# Patient Record
Sex: Female | Born: 1935 | Race: White | Hispanic: No | Marital: Married | Smoking: Former smoker
Health system: Southern US, Community
[De-identification: ages and names within clinical notes are randomized; demographics above are authoritative.]

## PROBLEM LIST (undated history)

## (undated) DIAGNOSIS — I422 Other hypertrophic cardiomyopathy: Secondary | ICD-10-CM

## (undated) DIAGNOSIS — I059 Rheumatic mitral valve disease, unspecified: Secondary | ICD-10-CM

## (undated) DIAGNOSIS — I2510 Atherosclerotic heart disease of native coronary artery without angina pectoris: Secondary | ICD-10-CM

## (undated) DIAGNOSIS — G4730 Sleep apnea, unspecified: Secondary | ICD-10-CM

## (undated) DIAGNOSIS — I350 Nonrheumatic aortic (valve) stenosis: Secondary | ICD-10-CM

## (undated) DIAGNOSIS — J449 Chronic obstructive pulmonary disease, unspecified: Secondary | ICD-10-CM

## (undated) DIAGNOSIS — E785 Hyperlipidemia, unspecified: Secondary | ICD-10-CM

## (undated) DIAGNOSIS — D649 Anemia, unspecified: Secondary | ICD-10-CM

## (undated) DIAGNOSIS — E039 Hypothyroidism, unspecified: Secondary | ICD-10-CM

## (undated) DIAGNOSIS — I50812 Chronic right heart failure: Secondary | ICD-10-CM

## (undated) DIAGNOSIS — N6009 Solitary cyst of unspecified breast: Secondary | ICD-10-CM

## (undated) DIAGNOSIS — I10 Essential (primary) hypertension: Secondary | ICD-10-CM

## (undated) DIAGNOSIS — I4891 Unspecified atrial fibrillation: Secondary | ICD-10-CM

## (undated) HISTORY — DX: Hyperlipidemia, unspecified: E78.5

## (undated) HISTORY — DX: Essential (primary) hypertension: I10

## (undated) HISTORY — DX: Solitary cyst of unspecified breast: N60.09

## (undated) HISTORY — DX: Atherosclerotic heart disease of native coronary artery without angina pectoris: I25.10

## (undated) HISTORY — PX: COLONOSCOPY: SHX174

## (undated) HISTORY — DX: Other hypertrophic cardiomyopathy: I42.2

## (undated) HISTORY — DX: Unspecified atrial fibrillation: I48.91

## (undated) HISTORY — DX: Rheumatic mitral valve disease, unspecified: I05.9

## (undated) HISTORY — DX: Nonrheumatic aortic (valve) stenosis: I35.0

## (undated) HISTORY — PX: BREAST SURGERY: SHX581

## (undated) HISTORY — PX: OTHER SURGICAL HISTORY: SHX169

---

## 1998-09-15 ENCOUNTER — Ambulatory Visit (HOSPITAL_COMMUNITY): Admission: RE | Admit: 1998-09-15 | Discharge: 1998-09-15 | Payer: Self-pay | Admitting: Cardiology

## 1999-04-18 ENCOUNTER — Ambulatory Visit (HOSPITAL_COMMUNITY): Admission: RE | Admit: 1999-04-18 | Discharge: 1999-04-18 | Payer: Self-pay | Admitting: *Deleted

## 2001-05-21 HISTORY — PX: CATARACT EXTRACTION: SUR2

## 2001-05-21 HISTORY — PX: NECK MASS EXCISION: SHX2079

## 2002-03-02 ENCOUNTER — Encounter: Admission: RE | Admit: 2002-03-02 | Discharge: 2002-03-02 | Payer: Self-pay | Admitting: Otolaryngology

## 2002-03-02 ENCOUNTER — Encounter: Payer: Self-pay | Admitting: Otolaryngology

## 2002-03-03 ENCOUNTER — Encounter (INDEPENDENT_AMBULATORY_CARE_PROVIDER_SITE_OTHER): Payer: Self-pay | Admitting: *Deleted

## 2002-03-03 ENCOUNTER — Ambulatory Visit (HOSPITAL_BASED_OUTPATIENT_CLINIC_OR_DEPARTMENT_OTHER): Admission: RE | Admit: 2002-03-03 | Discharge: 2002-03-03 | Payer: Self-pay | Admitting: Otolaryngology

## 2002-03-17 ENCOUNTER — Ambulatory Visit (HOSPITAL_COMMUNITY): Admission: RE | Admit: 2002-03-17 | Discharge: 2002-03-17 | Payer: Self-pay | Admitting: Ophthalmology

## 2003-03-04 ENCOUNTER — Encounter: Admission: RE | Admit: 2003-03-04 | Discharge: 2003-03-04 | Payer: Self-pay | Admitting: Internal Medicine

## 2003-03-04 ENCOUNTER — Encounter: Payer: Self-pay | Admitting: Internal Medicine

## 2004-03-08 ENCOUNTER — Ambulatory Visit (HOSPITAL_COMMUNITY): Admission: RE | Admit: 2004-03-08 | Discharge: 2004-03-08 | Payer: Self-pay | Admitting: Internal Medicine

## 2004-08-10 ENCOUNTER — Encounter (INDEPENDENT_AMBULATORY_CARE_PROVIDER_SITE_OTHER): Payer: Self-pay | Admitting: Specialist

## 2004-08-10 ENCOUNTER — Ambulatory Visit (HOSPITAL_COMMUNITY): Admission: RE | Admit: 2004-08-10 | Discharge: 2004-08-10 | Payer: Self-pay | Admitting: *Deleted

## 2004-08-16 ENCOUNTER — Ambulatory Visit: Payer: Self-pay | Admitting: Internal Medicine

## 2004-08-21 ENCOUNTER — Ambulatory Visit: Payer: Self-pay | Admitting: Internal Medicine

## 2004-09-28 ENCOUNTER — Other Ambulatory Visit: Admission: RE | Admit: 2004-09-28 | Discharge: 2004-09-28 | Payer: Self-pay | Admitting: Internal Medicine

## 2005-02-22 ENCOUNTER — Ambulatory Visit: Payer: Self-pay | Admitting: Internal Medicine

## 2005-05-21 HISTORY — PX: MITRAL VALVE REPLACEMENT: SHX147

## 2005-07-23 ENCOUNTER — Ambulatory Visit: Payer: Self-pay

## 2005-07-23 ENCOUNTER — Encounter: Payer: Self-pay | Admitting: Cardiology

## 2005-07-27 ENCOUNTER — Ambulatory Visit: Payer: Self-pay | Admitting: Internal Medicine

## 2005-08-02 ENCOUNTER — Inpatient Hospital Stay (HOSPITAL_BASED_OUTPATIENT_CLINIC_OR_DEPARTMENT_OTHER): Admission: RE | Admit: 2005-08-02 | Discharge: 2005-08-02 | Payer: Self-pay | Admitting: Internal Medicine

## 2005-08-02 ENCOUNTER — Ambulatory Visit: Payer: Self-pay | Admitting: Internal Medicine

## 2005-08-07 ENCOUNTER — Ambulatory Visit: Payer: Self-pay | Admitting: Cardiology

## 2005-08-15 ENCOUNTER — Ambulatory Visit: Payer: Self-pay | Admitting: *Deleted

## 2005-09-28 ENCOUNTER — Ambulatory Visit: Payer: Self-pay | Admitting: Internal Medicine

## 2005-10-01 ENCOUNTER — Encounter (INDEPENDENT_AMBULATORY_CARE_PROVIDER_SITE_OTHER): Payer: Self-pay | Admitting: Specialist

## 2005-10-01 ENCOUNTER — Inpatient Hospital Stay (HOSPITAL_COMMUNITY): Admission: RE | Admit: 2005-10-01 | Discharge: 2005-10-13 | Payer: Self-pay | Admitting: Surgery

## 2005-10-17 ENCOUNTER — Ambulatory Visit: Payer: Self-pay | Admitting: Internal Medicine

## 2005-10-23 ENCOUNTER — Ambulatory Visit: Payer: Self-pay

## 2005-10-31 ENCOUNTER — Ambulatory Visit: Payer: Self-pay | Admitting: Cardiovascular Disease

## 2005-11-01 ENCOUNTER — Encounter (HOSPITAL_COMMUNITY): Admission: RE | Admit: 2005-11-01 | Discharge: 2006-01-30 | Payer: Self-pay | Admitting: Internal Medicine

## 2005-11-06 ENCOUNTER — Ambulatory Visit: Payer: Self-pay | Admitting: Cardiology

## 2005-11-12 ENCOUNTER — Encounter: Payer: Self-pay | Admitting: Cardiology

## 2005-11-12 ENCOUNTER — Ambulatory Visit: Payer: Self-pay

## 2005-11-12 ENCOUNTER — Ambulatory Visit: Payer: Self-pay | Admitting: Internal Medicine

## 2005-11-14 ENCOUNTER — Ambulatory Visit: Payer: Self-pay | Admitting: Internal Medicine

## 2005-11-16 ENCOUNTER — Ambulatory Visit: Payer: Self-pay | Admitting: Internal Medicine

## 2005-11-27 ENCOUNTER — Ambulatory Visit: Payer: Self-pay | Admitting: *Deleted

## 2005-11-27 ENCOUNTER — Ambulatory Visit: Payer: Self-pay | Admitting: Internal Medicine

## 2005-12-07 ENCOUNTER — Ambulatory Visit: Payer: Self-pay | Admitting: Cardiology

## 2005-12-13 ENCOUNTER — Ambulatory Visit: Payer: Self-pay | Admitting: Internal Medicine

## 2005-12-19 ENCOUNTER — Ambulatory Visit: Payer: Self-pay | Admitting: Internal Medicine

## 2006-01-06 DIAGNOSIS — Z953 Presence of xenogenic heart valve: Secondary | ICD-10-CM

## 2006-01-31 ENCOUNTER — Encounter (HOSPITAL_COMMUNITY): Admission: RE | Admit: 2006-01-31 | Discharge: 2006-02-15 | Payer: Self-pay | Admitting: Internal Medicine

## 2006-03-19 ENCOUNTER — Ambulatory Visit: Payer: Self-pay | Admitting: Internal Medicine

## 2006-03-19 LAB — CONVERTED CEMR LAB
AST: 22 units/L (ref 0–37)
Albumin: 3.6 g/dL (ref 3.5–5.2)
Alkaline Phosphatase: 69 units/L (ref 39–117)
Chol/HDL Ratio, serum: 3.8
Triglyceride fasting, serum: 85 mg/dL (ref 0–149)
VLDL: 17 mg/dL (ref 0–40)

## 2006-04-22 ENCOUNTER — Ambulatory Visit: Payer: Self-pay | Admitting: Internal Medicine

## 2006-04-22 LAB — CONVERTED CEMR LAB
AST: 22 units/L (ref 0–37)
BUN: 12 mg/dL (ref 6–23)
Calcium: 8.9 mg/dL (ref 8.4–10.5)
Chol/HDL Ratio, serum: 3.7
Cholesterol: 140 mg/dL (ref 0–200)
GFR calc non Af Amer: 86 mL/min
Glomerular Filtration Rate, Af Am: 105 mL/min/{1.73_m2}
Glucose, Bld: 90 mg/dL (ref 70–99)
Potassium: 4 meq/L (ref 3.5–5.1)
Pro B Natriuretic peptide (BNP): 507 pg/mL — ABNORMAL HIGH (ref 0.0–100.0)
VLDL: 26 mg/dL (ref 0–40)

## 2006-05-03 ENCOUNTER — Ambulatory Visit: Payer: Self-pay | Admitting: Internal Medicine

## 2006-05-24 ENCOUNTER — Ambulatory Visit: Payer: Self-pay | Admitting: Internal Medicine

## 2006-07-26 ENCOUNTER — Ambulatory Visit: Payer: Self-pay | Admitting: Internal Medicine

## 2006-07-26 LAB — CONVERTED CEMR LAB
AST: 24 units/L (ref 0–37)
HDL: 39.9 mg/dL (ref >39.0)
Total CHOL/HDL Ratio: 3.6
Triglycerides: 123 mg/dL (ref 0–149)

## 2006-09-03 ENCOUNTER — Encounter (INDEPENDENT_AMBULATORY_CARE_PROVIDER_SITE_OTHER): Payer: Self-pay | Admitting: Specialist

## 2006-09-03 ENCOUNTER — Ambulatory Visit (HOSPITAL_COMMUNITY): Admission: RE | Admit: 2006-09-03 | Discharge: 2006-09-03 | Payer: Self-pay | Admitting: *Deleted

## 2006-09-23 ENCOUNTER — Ambulatory Visit: Payer: Self-pay | Admitting: Internal Medicine

## 2007-01-31 ENCOUNTER — Ambulatory Visit: Payer: Self-pay | Admitting: Internal Medicine

## 2007-02-05 ENCOUNTER — Encounter: Payer: Self-pay | Admitting: Internal Medicine

## 2007-02-05 ENCOUNTER — Ambulatory Visit: Payer: Self-pay

## 2007-02-05 ENCOUNTER — Ambulatory Visit: Payer: Self-pay | Admitting: Internal Medicine

## 2007-02-05 LAB — CONVERTED CEMR LAB
BUN: 13 mg/dL (ref 6–23)
Basophils Absolute: 0 10*3/uL (ref 0.0–0.1)
Basophils Relative: 0.3 % (ref 0.0–1.0)
CO2: 31 meq/L (ref 19–32)
Calcium: 9.4 mg/dL (ref 8.4–10.5)
GFR calc non Af Amer: 86 mL/min
Glucose, Bld: 112 mg/dL — ABNORMAL HIGH (ref 70–99)
HCT: 40.8 % (ref 36.0–46.0)
Lymphocytes Relative: 16.8 % (ref 12.0–46.0)
MCHC: 34.1 g/dL (ref 30.0–36.0)
Monocytes Absolute: 0.6 10*3/uL (ref 0.2–0.7)
Neutrophils Relative %: 70.2 % (ref 43.0–77.0)
Platelets: 141 10*3/uL — ABNORMAL LOW (ref 150–400)
TSH: 1.52 microintl units/mL (ref 0.35–5.50)
WBC: 7.2 10*3/uL (ref 4.5–10.5)

## 2007-03-20 ENCOUNTER — Ambulatory Visit: Payer: Self-pay | Admitting: Internal Medicine

## 2007-05-22 HISTORY — PX: LAPAROSCOPIC CHOLECYSTECTOMY: SUR755

## 2007-06-18 ENCOUNTER — Encounter: Admission: RE | Admit: 2007-06-18 | Discharge: 2007-06-18 | Payer: Self-pay | Admitting: Internal Medicine

## 2007-07-24 ENCOUNTER — Ambulatory Visit: Payer: Self-pay | Admitting: Internal Medicine

## 2007-08-04 ENCOUNTER — Encounter: Payer: Self-pay | Admitting: Internal Medicine

## 2007-08-04 ENCOUNTER — Ambulatory Visit: Payer: Self-pay

## 2007-10-02 ENCOUNTER — Ambulatory Visit (HOSPITAL_COMMUNITY): Admission: RE | Admit: 2007-10-02 | Discharge: 2007-10-02 | Payer: Self-pay | Admitting: General Surgery

## 2007-10-02 ENCOUNTER — Encounter (INDEPENDENT_AMBULATORY_CARE_PROVIDER_SITE_OTHER): Payer: Self-pay | Admitting: General Surgery

## 2008-04-05 ENCOUNTER — Ambulatory Visit: Payer: Self-pay | Admitting: Internal Medicine

## 2008-08-30 ENCOUNTER — Encounter: Payer: Self-pay | Admitting: Internal Medicine

## 2008-09-06 ENCOUNTER — Encounter: Admission: RE | Admit: 2008-09-06 | Discharge: 2008-09-06 | Payer: Self-pay | Admitting: Internal Medicine

## 2008-11-03 ENCOUNTER — Encounter (INDEPENDENT_AMBULATORY_CARE_PROVIDER_SITE_OTHER): Payer: Self-pay | Admitting: *Deleted

## 2008-11-03 ENCOUNTER — Ambulatory Visit (HOSPITAL_COMMUNITY): Admission: RE | Admit: 2008-11-03 | Discharge: 2008-11-03 | Payer: Self-pay | Admitting: *Deleted

## 2009-01-06 DIAGNOSIS — Z872 Personal history of diseases of the skin and subcutaneous tissue: Secondary | ICD-10-CM | POA: Insufficient documentation

## 2009-01-06 DIAGNOSIS — I482 Chronic atrial fibrillation, unspecified: Secondary | ICD-10-CM | POA: Insufficient documentation

## 2009-01-06 DIAGNOSIS — E785 Hyperlipidemia, unspecified: Secondary | ICD-10-CM | POA: Insufficient documentation

## 2009-01-06 DIAGNOSIS — I119 Hypertensive heart disease without heart failure: Secondary | ICD-10-CM | POA: Insufficient documentation

## 2009-01-06 DIAGNOSIS — I2510 Atherosclerotic heart disease of native coronary artery without angina pectoris: Secondary | ICD-10-CM | POA: Insufficient documentation

## 2009-01-06 DIAGNOSIS — I359 Nonrheumatic aortic valve disorder, unspecified: Secondary | ICD-10-CM | POA: Insufficient documentation

## 2009-01-10 ENCOUNTER — Ambulatory Visit: Payer: Self-pay | Admitting: Internal Medicine

## 2009-02-11 ENCOUNTER — Telehealth: Payer: Self-pay | Admitting: Internal Medicine

## 2009-02-21 ENCOUNTER — Telehealth: Payer: Self-pay | Admitting: Internal Medicine

## 2009-07-16 ENCOUNTER — Emergency Department (HOSPITAL_COMMUNITY): Admission: EM | Admit: 2009-07-16 | Discharge: 2009-07-16 | Payer: Self-pay | Admitting: Emergency Medicine

## 2009-07-16 ENCOUNTER — Ambulatory Visit: Payer: Self-pay | Admitting: Internal Medicine

## 2009-08-05 ENCOUNTER — Ambulatory Visit: Payer: Self-pay | Admitting: Internal Medicine

## 2009-08-10 ENCOUNTER — Ambulatory Visit: Payer: Self-pay

## 2009-08-10 ENCOUNTER — Ambulatory Visit (HOSPITAL_COMMUNITY): Admission: RE | Admit: 2009-08-10 | Discharge: 2009-08-10 | Payer: Self-pay | Admitting: Internal Medicine

## 2009-08-10 ENCOUNTER — Ambulatory Visit: Payer: Self-pay | Admitting: Internal Medicine

## 2009-08-10 ENCOUNTER — Encounter: Payer: Self-pay | Admitting: Internal Medicine

## 2009-08-10 LAB — CONVERTED CEMR LAB: POC INR: 1.4

## 2009-08-17 ENCOUNTER — Ambulatory Visit: Payer: Self-pay | Admitting: Internal Medicine

## 2009-08-24 ENCOUNTER — Ambulatory Visit: Payer: Self-pay | Admitting: Cardiology

## 2009-08-24 LAB — CONVERTED CEMR LAB: POC INR: 2.9

## 2009-08-31 ENCOUNTER — Ambulatory Visit: Payer: Self-pay | Admitting: Cardiovascular Disease

## 2009-09-12 ENCOUNTER — Telehealth: Payer: Self-pay | Admitting: Internal Medicine

## 2009-09-14 ENCOUNTER — Ambulatory Visit: Payer: Self-pay | Admitting: Internal Medicine

## 2009-09-28 ENCOUNTER — Encounter: Payer: Self-pay | Admitting: Pharmacist

## 2009-09-28 ENCOUNTER — Ambulatory Visit: Payer: Self-pay | Admitting: Cardiology

## 2009-10-07 ENCOUNTER — Ambulatory Visit: Payer: Self-pay | Admitting: Internal Medicine

## 2009-10-07 ENCOUNTER — Telehealth: Payer: Self-pay | Admitting: Internal Medicine

## 2009-10-19 ENCOUNTER — Ambulatory Visit: Payer: Self-pay | Admitting: Internal Medicine

## 2009-11-02 ENCOUNTER — Encounter: Payer: Self-pay | Admitting: Internal Medicine

## 2009-11-10 ENCOUNTER — Encounter: Payer: Self-pay | Admitting: Cardiovascular Disease

## 2009-11-10 ENCOUNTER — Ambulatory Visit: Payer: Self-pay | Admitting: Cardiology

## 2009-11-10 ENCOUNTER — Ambulatory Visit: Payer: Self-pay | Admitting: Internal Medicine

## 2009-11-10 DIAGNOSIS — I421 Obstructive hypertrophic cardiomyopathy: Secondary | ICD-10-CM | POA: Insufficient documentation

## 2009-11-10 LAB — CONVERTED CEMR LAB: POC INR: 1.3

## 2009-11-24 ENCOUNTER — Encounter: Payer: Self-pay | Admitting: Internal Medicine

## 2009-11-24 ENCOUNTER — Ambulatory Visit: Payer: Self-pay | Admitting: Internal Medicine

## 2009-11-24 ENCOUNTER — Ambulatory Visit: Payer: Self-pay

## 2009-11-30 ENCOUNTER — Telehealth: Payer: Self-pay | Admitting: Internal Medicine

## 2009-12-07 ENCOUNTER — Telehealth: Payer: Self-pay | Admitting: Internal Medicine

## 2009-12-15 ENCOUNTER — Ambulatory Visit: Payer: Self-pay | Admitting: Cardiology

## 2009-12-15 ENCOUNTER — Ambulatory Visit: Payer: Self-pay | Admitting: Internal Medicine

## 2009-12-15 ENCOUNTER — Encounter: Payer: Self-pay | Admitting: Internal Medicine

## 2009-12-15 LAB — CONVERTED CEMR LAB
CO2: 27 meq/L (ref 19–32)
Creatinine, Ser: 0.7 mg/dL (ref 0.4–1.2)
Glucose, Bld: 87 mg/dL (ref 70–99)
Potassium: 3.4 meq/L — ABNORMAL LOW (ref 3.5–5.1)

## 2009-12-27 ENCOUNTER — Telehealth: Payer: Self-pay | Admitting: Internal Medicine

## 2009-12-28 ENCOUNTER — Telehealth: Payer: Self-pay | Admitting: Internal Medicine

## 2010-01-05 ENCOUNTER — Ambulatory Visit: Payer: Self-pay | Admitting: Internal Medicine

## 2010-01-05 LAB — CONVERTED CEMR LAB: POC INR: 2.3

## 2010-01-06 ENCOUNTER — Telehealth: Payer: Self-pay | Admitting: Internal Medicine

## 2010-01-09 LAB — CONVERTED CEMR LAB
BUN: 17 mg/dL (ref 6–23)
CO2: 30 meq/L (ref 19–32)
Calcium: 9.2 mg/dL (ref 8.4–10.5)
Chloride: 106 meq/L (ref 96–112)
Creatinine, Ser: 0.7 mg/dL (ref 0.4–1.2)
GFR calc non Af Amer: 85.43 mL/min (ref >60)
Glucose, Bld: 70 mg/dL (ref 70–99)
Potassium: 3.8 meq/L (ref 3.5–5.1)

## 2010-01-30 ENCOUNTER — Ambulatory Visit: Payer: Self-pay | Admitting: Cardiovascular Disease

## 2010-01-30 ENCOUNTER — Ambulatory Visit: Payer: Self-pay | Admitting: Internal Medicine

## 2010-02-14 ENCOUNTER — Telehealth: Payer: Self-pay | Admitting: Internal Medicine

## 2010-02-24 ENCOUNTER — Encounter: Payer: Self-pay | Admitting: Internal Medicine

## 2010-03-06 ENCOUNTER — Telehealth (INDEPENDENT_AMBULATORY_CARE_PROVIDER_SITE_OTHER): Payer: Self-pay | Admitting: *Deleted

## 2010-03-07 ENCOUNTER — Encounter: Payer: Self-pay | Admitting: Internal Medicine

## 2010-03-13 ENCOUNTER — Ambulatory Visit: Payer: Self-pay | Admitting: Internal Medicine

## 2010-03-13 LAB — CONVERTED CEMR LAB: POC INR: 2.4

## 2010-03-30 ENCOUNTER — Encounter
Admission: RE | Admit: 2010-03-30 | Discharge: 2010-05-05 | Payer: Self-pay | Source: Home / Self Care | Attending: Orthopedic Surgery | Admitting: Orthopedic Surgery

## 2010-05-04 ENCOUNTER — Encounter: Payer: Self-pay | Admitting: Cardiovascular Disease

## 2010-05-26 ENCOUNTER — Ambulatory Visit
Admission: RE | Admit: 2010-05-26 | Discharge: 2010-05-26 | Payer: Self-pay | Source: Home / Self Care | Attending: Internal Medicine | Admitting: Internal Medicine

## 2010-05-26 ENCOUNTER — Encounter: Payer: Self-pay | Admitting: Internal Medicine

## 2010-05-29 LAB — CONVERTED CEMR LAB
TSH: 5.202 microintl units/mL — ABNORMAL HIGH (ref 0.350–4.500)
Vit D, 25-Hydroxy: 34 ng/mL (ref 30–89)

## 2010-05-30 ENCOUNTER — Other Ambulatory Visit: Payer: Self-pay | Admitting: Internal Medicine

## 2010-05-30 ENCOUNTER — Ambulatory Visit
Admission: RE | Admit: 2010-05-30 | Discharge: 2010-05-30 | Payer: Self-pay | Source: Home / Self Care | Attending: Internal Medicine | Admitting: Internal Medicine

## 2010-05-30 LAB — T3, FREE: T3, Free: 1.8 pg/mL — ABNORMAL LOW (ref 2.3–4.2)

## 2010-05-30 LAB — T4, FREE: Free T4: 0.69 ng/dL (ref 0.60–1.60)

## 2010-06-09 ENCOUNTER — Encounter: Payer: Self-pay | Admitting: Internal Medicine

## 2010-06-13 ENCOUNTER — Telehealth: Payer: Self-pay | Admitting: Internal Medicine

## 2010-06-20 NOTE — Miscellaneous (Signed)
Summary: echo added to flow sheet  Clinical Lists Changes  Observations: Added new observation of ECHOINTERP:         Study Conclusions            - Left ventricle: The cavity size was normal. Wall thickness was       increased in a pattern of mild LVH. Systolic function was       vigorous. The estimated ejection fraction was in the range of 70%       to 75%. There was dynamic obstruction, with mid-cavity       obliteration. Wall motion was normal; there were no regional wall       motion abnormalities. Features are consistent with a pseudonormal       left ventricular filling pattern, with concomitant abnormal       relaxation and increased filling pressure (grade 2 diastolic       dysfunction). Doppler parameters are consistent with very high       ventricular filling pressure. The subaortic valve LVOT is very       narrow with turbulent flow. Peak gradient across LVOT and AoV is       33mm HG with mean 16. No significant change with Valsalva.     - Aortic valve: Trivial regurgitation.     - Mitral valve: A bioprosthesis was present. Valve area by pressure       half-time: 2.47cm 2. (08/10/2009 11:07)      Echocardiogram  Procedure date:  08/10/2009  Findings:              Study Conclusions            - Left ventricle: The cavity size was normal. Wall thickness was       increased in a pattern of mild LVH. Systolic function was       vigorous. The estimated ejection fraction was in the range of 70%       to 75%. There was dynamic obstruction, with mid-cavity       obliteration. Wall motion was normal; there were no regional wall       motion abnormalities. Features are consistent with a pseudonormal       left ventricular filling pattern, with concomitant abnormal       relaxation and increased filling pressure (grade 2 diastolic       dysfunction). Doppler parameters are consistent with very high       ventricular filling pressure. The subaortic valve LVOT is very    narrow with turbulent flow. Peak gradient across LVOT and AoV is       33mm HG with mean 16. No significant change with Valsalva.     - Aortic valve: Trivial regurgitation.     - Mitral valve: A bioprosthesis was present. Valve area by pressure       half-time: 2.47cm 2.

## 2010-06-20 NOTE — Progress Notes (Signed)
Summary: question re toporol  Phone Note Call from Patient   Caller: Patient [phone] Reason for Call: Talk to Nurse Summary of Call: pt calling re toporol Initial call taken by: Glynda Jaeger,  February 14, 2010 2:24 PM  Follow-up for Phone Call        Patient called to report BP readings since she is not taking Toprol anymore because of the nightmares. They are the following: 98/57 hr67 99/56 hr69 103/56 hr57 120/58 hr57 117/60 hr58. She states that she is still having dreams at night but not nightmares. No complaints of palps or dizziness. Advised her I would pass BP readings along to Dr.Brittay Mogle. She will follow the same medication protocols for now. Layne Benton, RN, BSN  February 14, 2010 7:09 PM      Appended Document: question re toporol Stay off toprol. If she is feeling OK keep on other meds.  If she is feeling dizzy and bp is consistently below 100 would decrease Norvasc to 1/2 tab per day Patient should bring cuff to cllnic.  Appended Document: question re toporol LMOM with above information.  Appended Document: question re toporol I would keep things the same.  If she notes dizziness could cut Cozaar in 1/2.  Follow bp.

## 2010-06-20 NOTE — Progress Notes (Signed)
Summary: med ques  Phone Note From Pharmacy   Caller: [facility]  [address] [phone]* Summary of Call: Per Ebony Cargo caling to verify pt cozaar. Pt states should be twice a day not once. [phone] Initial call taken by: Edman Circle,  December 28, 2009 12:40 PM  Follow-up for Phone Call        PHARMACISTS AWARE PT TO BE TAKING COZAAR 100 MG 1 once daily Follow-up by: Scherrie Bateman, LPN,  December 28, 2009 12:53 PM

## 2010-06-20 NOTE — Progress Notes (Signed)
Summary: c/o bp issues  Phone Note Call from Patient Call back at Home Phone [phone]   Caller: Patient Reason for Call: Talk to Nurse Summary of Call: per pt calling, meds was increase to 100 mg c/o b/p 156/71. no cp, sob.  Initial call taken by: Lorne Skeens,  November 30, 2009 12:47 PM  Follow-up for Phone Call        I spoke with the pt and she did increase her cozaar as instructed.  The pt was concerned because her BP at home has been running 173/80, 163/81, 173/80, 148/71, and 156/75.  The pt's pulse 49, 50, 53.  I will forward this information to Dr Tenny Craw for further recommendations.  Julieta Gutting, RN, BSN  November 30, 2009 1:07 PM  Additional Follow-up for Phone Call Additional follow up Details #1::        I called patient in follow up and advised her to take BP and HR every day so we can see a trend and call me back on Monday (7/18)  with readings so we can review with Dr.Ross. She agreed to this plan. Additional Follow-up by: Suzan Garibaldi RN    Additional Follow-up for Phone Call Additional follow up Details #2::    Called patient  BP running high 140s to 170s. Rec:  Norvasc  5 mg (patientt will cut in 1/2 for 2.5) Continue other meds for now. When comes to coumadin clinic next wk  get EKG (may be set up) Keep checking bp Follow-up by: Sherrill Raring, MD, [facility],  December 07, 2009 11:24 AM   Appended Document: c/o bp issues**Dr Tenny Craw** called pt and left mess will do ekg and bp on 7/28 w/coumadin appt

## 2010-06-20 NOTE — Medication Information (Signed)
Summary: Patricia [name]  Anticoagulant Therapy  Managed by: Eda Keys, PharmD Referring MD: Dr Dietrich Pates PCP: Dr Diona Foley MD: Gala Romney MD, Reuel Boom Indication 1: Atrial Fibrillation Lab Used: [facility] [place] Site: [address] INR POC 2.6 INR RANGE 2.0-3.0  Dietary changes: no    Health status changes: no    Bleeding/hemorrhagic complications: no    Recent/future hospitalizations: no    Any changes in medication regimen? yes       Details: amiodarone 200 mg twice daily this past friday (08/12/09)  Recent/future dental: no  Any missed doses?: no       Is patient compliant with meds? yes       Current Medications (verified): 1)  Cartia Xt 240 Mg Xr24h-Cap (Diltiazem Hcl Coated Beads) .... One Tab Daily 2)  Cozaar 50 Mg Tabs (Losartan Potassium) .... One Tab Daily 3)  Crestor 5 Mg Tabs (Rosuvastatin Calcium) .... Take 1/4 Tab Once Daily 4)  Centrum Ultra Womens  Tabs (Multiple Vitamins-Minerals) .... One Tab Daily 5)  Calcium-Vitamin D 600-200 Mg-Unit Tabs (Calcium-Vitamin D) .... One Tab Two Times A Day 6)  Vitamin D 1000 Unit Tabs (Cholecalciferol) .... One Tab Daily 7)  Aspirin 81 Mg Tbec (Aspirin) .... Take One Tablet By Mouth Daily 8)  Flaxseed Oil 1200 Mg Caps (Flaxseed (Linseed)) .... 2 By Mouth Daily 9)  Vitamin C 500 Mg Tabs (Ascorbic Acid) .[name].. 1 By Mouth Daily 10)  Warfarin Sodium 5 Mg Tabs (Warfarin Sodium) .... As Directed 11)  Amiodarone Hcl 400 Mg Tabs (Amiodarone Hcl) .... Take One Tablet By Mouth Daily For 2 Months  Allergies (verified): 1)  ! * Xray Dye 2)  ! Macrobid  Anticoagulation Management History:      The patient is taking warfarin and comes in today for a routine follow up visit.  Positive risk factors for bleeding include an age of 80 years or older.  The bleeding index is 'intermediate risk'.  Positive CHADS2 values include History of HTN and Age > 13 years old.  Anticoagulation responsible provider: Bensimhon MD,  Reuel Boom.  INR POC: 2.6.  Cuvette Lot#: [id number].  Exp: 09/2010.    Anticoagulation Management Assessment/Plan:      The patient's current anticoagulation dose is Warfarin sodium 5 mg tabs: AS DIRECTED.  The target INR is 2.0-3.0.  The next INR is due 08/24/2009.  Results were reviewed/authorized by Eda Keys, PharmD.  She was notified by Eda Keys.         Prior Anticoagulation Instructions: INR 1.4  Take 1.5 tablets today then resume same dosage 1 tablet daily.  Recheck in 1 week.    Current Anticoagulation Instructions: INR 2.6  Continue taking 1 tablet daily.  Return to clinic in 1 week.

## 2010-06-20 NOTE — Progress Notes (Signed)
Summary: speak to nurse  Phone Note Call from Patient Call back at Home Phone [phone]   Caller: Patient Reason for Call: Talk to Nurse Summary of Call: request to speak to nurse Initial call taken by: Migdalia Dk,  Oct 07, 2009 11:08 AM  Follow-up for Phone Call        Called pt. set up for  EKG in our office for 6/1 because her PCP does not do ekg's in his office. Follow-up by: Suzan Garibaldi RN

## 2010-06-20 NOTE — Assessment & Plan Note (Signed)
Summary: per check out/sf   Visit Type:  Follow-up Primary Provider:  Dr Renne Crigler  CC:  no complaints.  History of Present Illness: Patient is an 77 year old woman with a history of minimal CAD, LVH, PAF, subaortic stenosis (s/p resection) and MV dz (s/p MVR).  she has been on Amio since April. I saw her in clinic back in June of this year.  Dilt was d/ced and Toprol XL 25 was added for PVCs.  she has had f/u EKGs. Since seen she says she has had very vivid dreams, nightmares at times.  Some fatigue during day. Denies palpitations, no CP.  Breathing good.  Current Medications (verified): 1)  Crestor 5 Mg Tabs (Rosuvastatin Calcium) .... Take 1/4 Tab Once Daily 2)  Centrum Ultra Womens  Tabs (Multiple Vitamins-Minerals) .... One Tab Daily 3)  Calcium-Vitamin D 600-200 Mg-Unit Tabs (Calcium-Vitamin D) .... One Tab Two Times A Day 4)  Vitamin D 1000 Unit Tabs (Cholecalciferol) .... One Tab Daily 5)  Aspirin 81 Mg Tbec (Aspirin) .... Take One Tablet By Mouth Daily 6)  Fish Oil .... 2 By Mouth Daily 7)  Vitamin C 500 Mg Tabs (Ascorbic Acid) .[name].. 1 By Mouth Daily 8)  Warfarin Sodium 5 Mg Tabs (Warfarin Sodium) .... As Directed 9)  Cordarone 200 Mg Tabs (Amiodarone Hcl) .[name].. 1 Tab Every Day 10)  Boniva 150 Mg Tabs (Ibandronate Sodium) .... Once Monthly 11)  Toprol Xl 25 Mg Xr24h-Tab (Metoprolol Succinate) .[name].. 1 Tab Every Day 12)  Cozaar 100 Mg Tabs (Losartan Potassium) .[name].. 1 Tab Every Day 13)  Amlodipine Besylate 5 Mg Tabs (Amlodipine Besylate) .[name].. 1 Tab Once Daily  Allergies: 1)  ! * Xray Dye 2)  ! Macrobid  Past History:  Past Medical History: Last updated: 11/10/2009 Current Problems:  ATRIAL FIBRILLATION, HX OF (ICD-V12.59) CAD (ICD-414.00) MITRAL VALVE DISORDERS (ICD-424.0) HYPERTENSION, UNSPECIFIED (ICD-401.9) DYSLIPIDEMIA (ICD-272.4) AORTIC STENOSIS, MILD (ICD-424.1) MITRAL VALVE REPLACEMENT, HX OF (ICD-V15.1) BREAST CYST, HX OF (ICD-V13.3) Hypertrophic  Cardiomyopathy.  Past Surgical History: Last updated: 01/06/2009  Laparoscopic cholecystectomy- 2009  Senile nuclear cataract, left eye-2003  Mitral valve replacement- 2007    Excision of deep neck mass- 2003.    Family History: Last updated: 01/06/2009 Negative for premature coronary artery disease.  Social History: Last updated: 01/06/2009 The patient is married, quit tobacco in 1992, does not drink  Vital Signs:  Patient profile:   77 year old female Height:      65 inches Weight:      197 pounds Pulse rate:   56 / minute Pulse rhythm:   regular BP supine:   117 / 73  Vitals Entered By: Jacquelin Hawking, CMA (January 30, 2010 9:00 AM)  Physical Exam  Additional Exam:  Patient is in NAD HEENT:  Normocephalic, atraumatic. EOMI, PERRLA.  Neck: JVP is normal. No thyromegaly. Lungs: clear to auscultation. No rales no wheezes.  Heart: Regular rate and rhythm. Normal S1, S2. No S3.   Gr III/VI systolic murmur at base, Gr II/VI holosystolic at apex.[name] PMI not displaced.  Abdomen:  Supple, nontender. Normal bowel sounds. No masses. No hepatomegaly.  Extremities:   Good distal pulses throughout. No lower extremity edema.  Musculoskeletal :moving all extremities.  Neuro:   alert and oriented x3.    EKG  Procedure date:  01/30/2010  Findings:      Sinus bradycardia.  49 bpm.  First degree AV block  242 ms.  LVH.Nonspecific ST T wave changes.  Impression & Recommendations:  Problem # 1:  ATRIAL FIBRILLATION, HX OF (ICD-V12.59) Patient remains in Sr.  EKG shows some  increased porlongation of PR interval.  I ould back off on Metoprolol  (1/2 per day for one wk then D/C)  Continue Amio.  I will see her back in January. I am not convinced Toprol is cause for dreams but will d/c and follow.  Problem # 2:  CARDIOMYOPATHY, HYPERTROPHIC (ICD-425.1) Doing OK.  Volume looks good.  Patient Instructions: 1)  Your physician recommends that you schedule a follow-up appointment in:  beginning of January 2012 with Dr. Tenny Craw.

## 2010-06-20 NOTE — Assessment & Plan Note (Signed)
Summary: EPH   Primary Provider:  Dr Renne Crigler   History of Present Illness: Patient is an 77 year old woman with a history of minimal CAD, LVH, PAF, subaortic stenosis (s/p resection) and MV dz (s/p MVR.  I last saw her in August of 2010.  SInce then she was seen in [facility] ER with atrial fibrillation.  She converted to SR after receiving cardiazem.  She has not had any palp since.  She was seen by Odessa Fleming.  Rec she return today for discussion of 1. anticoagulation 2.  antiarhythmic rx (Amio vs Disopyrimide) and 3.  family screening. She denies chest pain.  No significant SOB.  Current Medications (verified): 1)  Cartia Xt 240 Mg Xr24h-Cap (Diltiazem Hcl Coated Beads) .... One Tab Daily 2)  Cozaar 50 Mg Tabs (Losartan Potassium) .... One Tab Daily 3)  Crestor 5 Mg Tabs (Rosuvastatin Calcium) .... Take 1/4 Tab Once Daily 4)  Centrum Ultra Womens  Tabs (Multiple Vitamins-Minerals) .... One Tab Daily 5)  Calcium-Vitamin D 600-200 Mg-Unit Tabs (Calcium-Vitamin D) .... One Tab Two Times A Day 6)  Vitamin D 1000 Unit Tabs (Cholecalciferol) .... One Tab Daily 7)  Aspirin 81 Mg Tbec (Aspirin) .... Take One Tablet By Mouth Daily 8)  Flaxseed Oil 1200 Mg Caps (Flaxseed (Linseed)) .... 2 By Mouth Daily 9)  Vitamin C 500 Mg Tabs (Ascorbic Acid) .[name].. 1 By Mouth Daily  Allergies (verified): 1)  ! * Xray Dye 2)  ! Macrobid  Past History:  Past medical, surgical, family and social histories (including risk factors) reviewed, and no changes noted (except as noted below).  Past Medical History: Reviewed history from 01/06/2009 and no changes required. Current Problems:  ATRIAL FIBRILLATION, HX OF (ICD-V12.59) CAD (ICD-414.00) MITRAL VALVE DISORDERS (ICD-424.0) HYPERTENSION, UNSPECIFIED (ICD-401.9) DYSLIPIDEMIA (ICD-272.4) AORTIC STENOSIS, MILD (ICD-424.1) MITRAL VALVE REPLACEMENT, HX OF (ICD-V15.1) BREAST CYST, HX OF (ICD-V13.3)  Past Surgical History: Reviewed history from 01/06/2009 and no  changes required.  Laparoscopic cholecystectomy- 2009  Senile nuclear cataract, left eye-2003  Mitral valve replacement- 2007    Excision of deep neck mass- 2003.    Family History: Reviewed history from 01/06/2009 and no changes required. Negative for premature coronary artery disease.  Social History: Reviewed history from 01/06/2009 and no changes required. The patient is married, quit tobacco in 1992, does not drink  Vital Signs:  Patient profile:   77 year old female Height:      65 inches Weight:      189 pounds BMI:     31.56 Pulse rate:   67 / minute Resp:     16 per minute BP sitting:   121 / 72  (left arm) Cuff size:   regular  Vitals Entered By: Burnett Kanaris, CNA (August 05, 2009 12:28 PM)  Physical Exam  Additional Exam:  patient is in NAD.   HEENT:  Normocephalic, atraumatic. EOMI, PERRLA.  Neck: JVP is normal. No thyromegaly. No bruits.  Lungs: clear to auscultation. No rales no wheezes.  Heart: Regular rate and rhythm. Normal S1, S2. No S3.   Gr. II/VI systolic murmur L sternal border. PMI not displaced.  Abdomen:  Supple, nontender. Normal bowel sounds. No masses. No hepatomegaly.  Extremities:   Good distal pulses throughout. No lower extremity edema.  Musculoskeletal :moving all extremities.  Neuro:   alert and oriented x3.    EKG  Procedure date:  08/05/2009  Findings:      NSR.  67 bpm.  LAFB.  LVH with  repol abnormality.  Septal infarct.  Impression & Recommendations:  Problem # 1:  ATRIAL FIBRILLATION, HX OF (ICD-V12.59) Patient needs to start anticoagulation.  I would recomm Coumadin over Pradaxa.  Will start at 5 mg per day.  F/u in clinic next wk. Will review with S. Graciela Husbands antiarhythmic RX.  Problem # 2:  MITRAL VALVE REPLACEMENT, HX OF (ICD-V15.1) Reeval with echo.  Problem # 3:  AORTIC STENOSIS, MILD (ICD-424.1) patient had subaortic stenosis and underwent resection.  Will review echoes.  There are discrepencies in the  measurements.  Will review.  Problem # 4:  DYSLIPIDEMIA (ICD-272.4) Keep on current regimen.  Other Orders: EKG w/ Interpretation (93000) Echocardiogram (Echo) [address] Coumadin Clinic Referral (Coumadin clinic)  Patient Instructions: 1)  Your physician recommends that you schedule a follow-up appointment in: WILL CALL LATER 2)  Your physician has recommended you make the following change in your medication: COUMADIN 5 MG EVERY DAY 3)  You have been referred to COUMADIN CLINIC TUES OR WED 4)  Your physician has requested that you have an echocardiogram.  Echocardiography is a painless test that uses sound waves to create images of your heart. It provides your doctor with information about the size and shape of your heart and how well your heart's chambers and valves are working.  This procedure takes approximately one hour. There are no restrictions for this procedure. Prescriptions: WARFARIN SODIUM 5 MG TABS (WARFARIN SODIUM) AS DIRECTED  #30 x 6   Entered by:   Scherrie Bateman, LPN   Authorized by:   Sherrill Raring, MD, [facility]   Signed by:   Scherrie Bateman, LPN on 54/01/8118   Method used:   [facility]        [facility]  [address] [phone]* (retail)       [address]       [place], [place]  [address]       Ph: [phone] or [phone]       Fax: [phone]   RxID:   [id number]

## 2010-06-20 NOTE — Medication Information (Signed)
Summary: ccr/ gd  Anticoagulant Therapy  Managed by: Cloyde Reams, RN, BSN Referring MD: Dr Dietrich Pates PCP: Dr Diona Foley MD: Shirlee Latch MD, Freida Busman Indication 1: Atrial Fibrillation Lab Used: [facility] [place] Site: [address] INR POC 1.3 INR RANGE 2.0-3.0  Dietary changes: no    Health status changes: no    Bleeding/hemorrhagic complications: no    Recent/future hospitalizations: no    Any changes in medication regimen? no    Recent/future dental: no  Any missed doses?: yes     Details: Missed 1 dosage Sunday.   Is patient compliant with meds? yes       Allergies: 1)  ! * Xray Dye 2)  ! Macrobid  Anticoagulation Management History:      The patient is taking warfarin and comes in today for a routine follow up visit.  Positive risk factors for bleeding include an age of 74 years or older.  The bleeding index is 'intermediate risk'.  Positive CHADS2 values include History of HTN and Age > 85 years old.  Anticoagulation responsible provider: Shirlee Latch MD, Analisa Sledd.  INR POC: 1.3.  Exp: 12/2010.    Anticoagulation Management Assessment/Plan:      The patient's current anticoagulation dose is Warfarin sodium 5 mg tabs: AS DIRECTED.  The target INR is 2.0-3.0.  The next INR is due 11/24/2009.  Anticoagulation instructions were given to patient.  Results were reviewed/authorized by Cloyde Reams, RN, BSN.  She was notified by Cloyde Reams RN.         Prior Anticoagulation Instructions: INR 3.1  Start NEW dosing schedule of 1/2 tablet on Wednesday and Friday, and 1 tablet all other days.  Recheck INR in 3 weeks.    Current Anticoagulation Instructions: INR 1.3  Take 1.5 tablets today then start taking 1 tablet daily except 1/2 tablet on Wednesdays.  Recheck in 2 weeks.

## 2010-06-20 NOTE — Medication Information (Signed)
Summary: Patricia [name]  Anticoagulant Therapy  Managed by: Bethena Midget, RN, BSN Referring MD: Dr Dietrich Pates PCP: Dr Diona Foley MD: Ladona Ridgel MD, Sharlot Gowda Indication 1: Atrial Fibrillation Lab Used: [facility] [place] Site: [address] INR POC 2.6 INR RANGE 2.0-3.0  Dietary changes: no    Health status changes: no    Bleeding/hemorrhagic complications: no    Recent/future hospitalizations: no    Any changes in medication regimen? no    Recent/future dental: no  Any missed doses?: no       Is patient compliant with meds? yes       Allergies: 1)  ! * Xray Dye 2)  ! Macrobid  Anticoagulation Management History:      The patient is taking warfarin and comes in today for a routine follow up visit.  Positive risk factors for bleeding include an age of 75 years or older.  The bleeding index is 'intermediate risk'.  Positive CHADS2 values include History of HTN and Age > 61 years old.  Anticoagulation responsible provider: Ladona Ridgel MD, Sharlot Gowda.  INR POC: 2.6.  Cuvette Lot#: [id number].  Exp: 01/2011.    Anticoagulation Management Assessment/Plan:      The patient's current anticoagulation dose is Warfarin sodium 5 mg tabs: AS DIRECTED.  The target INR is 2.0-3.0.  The next INR is due 12/15/2009.  Anticoagulation instructions were given to patient.  Results were reviewed/authorized by Bethena Midget, RN, BSN.  She was notified by Bethena Midget, RN, BSN.         Prior Anticoagulation Instructions: INR 1.3  Take 1.5 tablets today then start taking 1 tablet daily except 1/2 tablet on Wednesdays.  Recheck in 2 weeks.    Current Anticoagulation Instructions: INR 2.6  Continue same regimen of 2.5mg  (1/2 tab) on Wednesday and 5mg  (1tab) all other days.

## 2010-06-20 NOTE — Medication Information (Signed)
Summary: last seen 2007 started 5mg  08-05-09/sl  Anticoagulant Therapy  Managed by: Cloyde Reams, RN, BSN Referring MD: Dr Dietrich Pates PCP: Dr Diona Foley MD: Gala Romney MD, Reuel Boom Indication 1: Atrial Fibrillation Lab Used: [facility] [place] Site: [address] INR POC 1.4 INR RANGE 2.0-3.0  Dietary changes: no    Health status changes: no    Bleeding/hemorrhagic complications: no    Recent/future hospitalizations: no    Any changes in medication regimen? no    Recent/future dental: no  Any missed doses?: no       Is patient compliant with meds? yes       Allergies: 1)  ! * Xray Dye 2)  ! Macrobid  Anticoagulation Management History:      The patient is taking warfarin and comes in today for a routine follow up visit.  Positive risk factors for bleeding include an age of 75 years or older.  The bleeding index is 'intermediate risk'.  Positive CHADS2 values include History of HTN and Age > 75 years old.  Anticoagulation responsible provider: Ulyssa Walthour MD, Reuel Boom.  INR POC: 1.4.  Cuvette Lot#: [id number].  Exp: 09/2010.    Anticoagulation Management Assessment/Plan:      The patient's current anticoagulation dose is Warfarin sodium 5 mg tabs: AS DIRECTED.  The target INR is 2.0-3.0.  The next INR is due 08/17/2009.  Results were reviewed/authorized by Cloyde Reams, RN, BSN.  She was notified by Cloyde Reams RN.         Current Anticoagulation Instructions: INR 1.4  Take 1.5 tablets today then resume same dosage 1 tablet daily.  Recheck in 1 week.

## 2010-06-20 NOTE — Medication Information (Signed)
Summary: rov/ewj  Anticoagulant Therapy  Managed by: Cloyde Reams, RN, BSN Referring MD: Dr Dietrich Pates PCP: Dr Diona Foley MD: Tenny Craw MD, Gunnar Fusi Indication 1: Atrial Fibrillation Lab Used: [facility] [place] Site: [address] INR POC 2.4 INR RANGE 2.0-3.0  Dietary changes: yes       Details: Decr oral intake secondary to heartburn.  Health status changes: no    Bleeding/hemorrhagic complications: no    Recent/future hospitalizations: no    Any changes in medication regimen? yes       Details: Added Zoloft and Lorazepam  Recent/future dental: no  Any missed doses?: no       Is patient compliant with meds? yes       Allergies: 1)  ! * Xray Dye 2)  ! Macrobid  Anticoagulation Management History:      The patient is taking warfarin and comes in today for a routine follow up visit.  Positive risk factors for bleeding include an age of 75 years or older.  The bleeding index is 'intermediate risk'.  Positive CHADS2 values include History of HTN and Age > 107 years old.  Anticoagulation responsible provider: Tenny Craw MD, Gunnar Fusi.  INR POC: 2.4.  Cuvette Lot#: [id number].  Exp: 03/2011.    Anticoagulation Management Assessment/Plan:      The patient's current anticoagulation dose is Warfarin sodium 5 mg tabs: AS DIRECTED.  The target INR is 2.0-3.0.  The next INR is due 04/10/2010.  Anticoagulation instructions were given to patient.  Results were reviewed/authorized by Cloyde Reams, RN, BSN.  She was notified by Cloyde Reams RN.         Prior Anticoagulation Instructions: INR 2.3  Continue on same dosage 1 tablet daily except 1/2 tablet on Sundays and Wednesdays.  Recheck in 4 weeks.    Current Anticoagulation Instructions: INR 2.4  Continue on same dosage 1 tablet daily except 1/2 tablet on Sundays and Wednesdays.  Recheck in 4 weeks.

## 2010-06-20 NOTE — Assessment & Plan Note (Signed)
Summary: ekg/427.31  Nurse Visit   Vital Signs:  Patient profile:   77 year old female Pulse rate:   54 / minute Resp:     12 per minute BP sitting:   140 / 90  Vitals Entered By: Julieta Gutting, RN, BSN (November 24, 2009 10:30 AM)  Serial Vital Signs/Assessments:  Time      Position  BP       Pulse  Resp  Temp     By 10:25 AM            140/90                         Julieta Gutting, RN, BSN 10:27 AM            140/88                         Julieta Gutting, RN, BSN  Comments: 10:25 AM Left arm--Large cuff By: Julieta Gutting, RN, BSN  10:27 AM Right arm--Large cuff By: Julieta Gutting, RN, BSN    Current Medications (verified): 1)  Cozaar 50 Mg Tabs (Losartan Potassium) .... One Tab Daily 2)  Crestor 5 Mg Tabs (Rosuvastatin Calcium) .... Take 1/4 Tab Once Daily 3)  Centrum Ultra Womens  Tabs (Multiple Vitamins-Minerals) .... One Tab Daily 4)  Calcium-Vitamin D 600-200 Mg-Unit Tabs (Calcium-Vitamin D) .... One Tab Two Times A Day 5)  Vitamin D 1000 Unit Tabs (Cholecalciferol) .... One Tab Daily 6)  Aspirin 81 Mg Tbec (Aspirin) .... Take One Tablet By Mouth Daily 7)  Fish Oil .... 2 By Mouth Daily 8)  Vitamin C 500 Mg Tabs (Ascorbic Acid) .[name].. 1 By Mouth Daily 9)  Warfarin Sodium 5 Mg Tabs (Warfarin Sodium) .... As Directed 10)  Cordarone 200 Mg Tabs (Amiodarone Hcl) .[name].. 1 Tab Every Day 11)  Boniva 150 Mg Tabs (Ibandronate Sodium) .... Once Monthly 12)  Toprol Xl 25 Mg Xr24h-Tab (Metoprolol Succinate) .[name].. 1 Tab Every Day  Allergies: 1)  ! * Xray Dye 2)  ! Macrobid    Impression & Recommendations:  Problem # 1:  ATRIAL FIBRILLATION, HX OF (ICD-V12.59) The pt came into the office today for a repeat EKG to assess her QT interval.  EKG obtained with QT .  Since her appt the pt had an episode of chest tightness and felt this was indigestion.  The pt did take tums and her symptoms resolved. The pt states that she overall feels better since her last appt. The pt has also been  monitoring her BP at home on a wrist cuff and her SBP has been running 80-90.  We rechecked her BP in the office today with her cuff (read 93/70)from home and instructed her to get a BP cuff for her arm. I will have Dr Tenny Craw review the pt's EKG for any further recommendations.  Julieta Gutting, RN, BSN  November 24, 2009 11:05 AM    Patient Instructions: 1)  Your physician has recommended you make the following change in your medication: [facility] for patient to increase Cozaar to 100mg  every day and return for BMET, BP check, and EKG on 12/15/2009.

## 2010-06-20 NOTE — Medication Information (Signed)
Summary: rov/sp  Anticoagulant Therapy  Managed by: Cloyde Reams, RN, BSN Referring MD: Dr Dietrich Pates PCP: Dr Diona Foley MD: Eden Emms MD, Theron Arista Indication 1: Atrial Fibrillation Lab Used: [facility] [place] Site: [address] INR POC 3.1 INR RANGE 2.0-3.0  Dietary changes: no    Health status changes: yes       Details: Feet swelling, worse at night, relieved by elevation.    Bleeding/hemorrhagic complications: no    Recent/future hospitalizations: no    Any changes in medication regimen? no    Recent/future dental: no  Any missed doses?: no       Is patient compliant with meds? yes       Allergies: 1)  ! * Xray Dye 2)  ! Macrobid  Anticoagulation Management History:      The patient is taking warfarin and comes in today for a routine follow up visit.  Positive risk factors for bleeding include an age of 41 years or older.  The bleeding index is 'intermediate risk'.  Positive CHADS2 values include History of HTN and Age > 22 years old.  Anticoagulation responsible provider: Eden Emms MD, Theron Arista.  INR POC: 3.1.  Cuvette Lot#: [id number].  Exp: 10/2010.    Anticoagulation Management Assessment/Plan:      The patient's current anticoagulation dose is Warfarin sodium 5 mg tabs: AS DIRECTED.  The target INR is 2.0-3.0.  The next INR is due 09/28/2009.  Anticoagulation instructions were given to patient.  Results were reviewed/authorized by Cloyde Reams, RN, BSN.  She was notified by Cloyde Reams RN.         Prior Anticoagulation Instructions: INR 2.9 Continue 5mg s daily. Recheck in 2 weeks   Current Anticoagulation Instructions: INR 3.1  Start taking 1/2 tablet on Wednesday, and 1 tablet all other days.  Recheck in 2 weeks.

## 2010-06-20 NOTE — Medication Information (Signed)
Summary: rov/ewj  Anticoagulant Therapy  Managed by: Bethena Midget, RN, BSN Referring MD: Dr Dietrich Pates PCP: Dr Diona Foley MD: Gala Romney MD, Reuel Boom Indication 1: Atrial Fibrillation Lab Used: [facility] [place] Site: [address] INR POC 2.3 INR RANGE 2.0-3.0  Dietary changes: no    Health status changes: no    Bleeding/hemorrhagic complications: no    Recent/future hospitalizations: no    Any changes in medication regimen? no    Recent/future dental: no  Any missed doses?: no       Is patient compliant with meds? yes       Allergies: 1)  ! * Xray Dye 2)  ! Macrobid  Anticoagulation Management History:      The patient is taking warfarin and comes in today for a routine follow up visit.  Positive risk factors for bleeding include an age of 2 years or older.  The bleeding index is 'intermediate risk'.  Positive CHADS2 values include History of HTN and Age > 58 years old.  Anticoagulation responsible provider: Bensimhon MD, Reuel Boom.  INR POC: 2.3.  Cuvette Lot#: [id number].  Exp: 02/2011.    Anticoagulation Management Assessment/Plan:      The patient's current anticoagulation dose is Warfarin sodium 5 mg tabs: AS DIRECTED.  The target INR is 2.0-3.0.  The next INR is due 01/30/2010.  Anticoagulation instructions were given to patient.  Results were reviewed/authorized by Bethena Midget, RN, BSN.  She was notified by Bethena Midget, RN, BSN.         Prior Anticoagulation Instructions: INR 3.5  Take 1/2 tablet today, then start taking 1 tablet daily except 1/2 tablet on Sundays and Wednesdays.  Recheck in 3 weeks.    Current Anticoagulation Instructions: INR 2.3 Continue 5mg s everyday except 2.5mg s on Sundays and Wednesdays. Recheck in 3 weeks.

## 2010-06-20 NOTE — Medication Information (Signed)
Summary: rov/tm  Anticoagulant Therapy  Managed by: Cloyde Reams, RN, BSN Referring MD: Dr Dietrich Pates PCP: Dr Diona Foley MD: Eden Emms MD, Theron Arista Indication 1: Atrial Fibrillation Lab Used: [facility] [place] Site: [address] INR POC 2.3 INR RANGE 2.0-3.0  Dietary changes: no    Health status changes: no    Bleeding/hemorrhagic complications: no    Recent/future hospitalizations: no    Any changes in medication regimen? yes       Details: Stopped Boniva.  Recent/future dental: no  Any missed doses?: no       Is patient compliant with meds? yes       Allergies: 1)  ! * Xray Dye 2)  ! Macrobid  Anticoagulation Management History:      The patient is taking warfarin and comes in today for a routine follow up visit.  Positive risk factors for bleeding include an age of 19 years or older.  The bleeding index is 'intermediate risk'.  Positive CHADS2 values include History of HTN and Age > 35 years old.  Anticoagulation responsible provider: Eden Emms MD, Theron Arista.  INR POC: 2.3.  Cuvette Lot#: [id number].  Exp: 03/2011.    Anticoagulation Management Assessment/Plan:      The patient's current anticoagulation dose is Warfarin sodium 5 mg tabs: AS DIRECTED.  The target INR is 2.0-3.0.  The next INR is due 02/27/2010.  Anticoagulation instructions were given to patient.  Results were reviewed/authorized by Cloyde Reams, RN, BSN.  She was notified by Cloyde Reams RN.         Prior Anticoagulation Instructions: INR 2.3 Continue 5mg s everyday except 2.5mg s on Sundays and Wednesdays. Recheck in 3 weeks.   Current Anticoagulation Instructions: INR 2.3  Continue on same dosage 1 tablet daily except 1/2 tablet on Sundays and Wednesdays.  Recheck in 4 weeks.

## 2010-06-20 NOTE — Progress Notes (Signed)
Summary: refill request  Phone Note Refill Request Message from:  Patient on December 27, 2009 10:38 AM  Refills Requested: Medication #1:  COZAAR 100 MG TABS 1 tab every day pt needs refill of med at [facility] / rx changed to two a day   Method Requested: Telephone to Pharmacy Initial call taken by: Glynda Jaeger,  December 27, 2009 10:40 AM  Follow-up for Phone Call        Rx faxed to pharmacy.  Vikki Ports  December 27, 2009 1:55 PM         Prescriptions: COZAAR 100 MG TABS (LOSARTAN POTASSIUM) 1 tab every day  #60 x 6   Entered by:   Vikki Ports   Authorized by:   Sherrill Raring, MD, [facility]   Signed by:   Vikki Ports on 12/27/2009   Method used:   Faxed to ...       [facility] [address] [phone]* (retail)       [address]       [place], [place]  [address]       Ph: [phone] or [phone]       Fax: [phone]   RxID:   [id number]

## 2010-06-20 NOTE — Progress Notes (Signed)
Summary: calling re med not at pharmacy  Phone Note Call from Patient   Caller: Patient Reason for Call: Talk to Nurse Summary of Call: pt said she talked to dr Tenny Craw this am and she was going to call in a med for her heart to [facility] and they don't have anything yet-pls call [phone] Initial call taken by: Glynda Jaeger,  December 07, 2009 4:18 PM  Follow-up for Phone Call        spoke w/pt will send in rx for amlodipine 5mg  1/2 tab daily Meredith Staggers, RN  December 07, 2009 4:27 PM     New/Updated Medications: AMLODIPINE BESYLATE 5 MG TABS (AMLODIPINE BESYLATE) Take 1/2 tablet by mouth daily Prescriptions: AMLODIPINE BESYLATE 5 MG TABS (AMLODIPINE BESYLATE) Take 1/2 tablet by mouth daily  #30 x 6   Entered by:   Meredith Staggers, RN   Authorized by:   Sherrill Raring, MD, [facility]   Signed by:   Meredith Staggers, RN on 12/07/2009   Method used:   [facility]        [facility] [address] [phone]* (retail)       [address]       [place], [place]  [address]       Ph: [phone] or [phone]       Fax: [phone]   RxID:   [id number]

## 2010-06-20 NOTE — Miscellaneous (Signed)
  Clinical Lists Changes  Observations: Added new observation of CXR RESULTS: Findings: Previous median sternotomy and prosthetic heart valve.   No focal pneumonia, edema, effusion or pneumothorax.  Prominent   right hilum as before.  No significant interval change or   superimposed acute process.    IMPRESSION:   Postop changes from heart surgery   Right hilar prominence, stable   No superimposed pneumonia or edema    Read By:  Sigurd Sos.,  M.D.   Released By:  Sigurd Sos.,  M.D.  (07/16/2009 10:37)      CXR  Procedure date:  07/16/2009  Findings:      Findings: Previous median sternotomy and prosthetic heart valve.   No focal pneumonia, edema, effusion or pneumothorax.  Prominent   right hilum as before.  No significant interval change or   superimposed acute process.    IMPRESSION:   Postop changes from heart surgery   Right hilar prominence, stable   No superimposed pneumonia or edema    Read By:  Sigurd Sos.,  M.D.   Released By:  Sigurd Sos.,  M.D.

## 2010-06-20 NOTE — Medication Information (Signed)
Summary: rov/ln  Anticoagulant Therapy  Managed by: Cloyde Reams, RN, BSN Referring MD: Dr Dietrich Pates PCP: Dr Diona Foley MD: Ladona Ridgel MD, Sharlot Gowda Indication 1: Atrial Fibrillation Lab Used: [facility] [place] Site: [address] INR POC 3.5 INR RANGE 2.0-3.0  Dietary changes: no    Health status changes: no    Bleeding/hemorrhagic complications: no    Recent/future hospitalizations: no    Any changes in medication regimen? yes    Recent/future dental: no  Any missed doses?: no       Is patient compliant with meds? yes       Allergies: 1)  ! * Xray Dye 2)  ! Macrobid  Anticoagulation Management History:      The patient is taking warfarin and comes in today for a routine follow up visit.  Positive risk factors for bleeding include an age of 23 years or older.  The bleeding index is 'intermediate risk'.  Positive CHADS2 values include History of HTN and Age > 74 years old.  Anticoagulation responsible provider: Ladona Ridgel MD, Sharlot Gowda.  INR POC: 3.5.  Exp: 01/2011.    Anticoagulation Management Assessment/Plan:      The patient's current anticoagulation dose is Warfarin sodium 5 mg tabs: AS DIRECTED.  The target INR is 2.0-3.0.  The next INR is due 01/05/2010.  Anticoagulation instructions were given to patient.  Results were reviewed/authorized by Cloyde Reams, RN, BSN.  She was notified by Cloyde Reams RN.         Prior Anticoagulation Instructions: INR 2.6  Continue same regimen of 2.5mg  (1/2 tab) on Wednesday and 5mg  (1tab) all other days.  Current Anticoagulation Instructions: INR 3.5  Take 1/2 tablet today, then start taking 1 tablet daily except 1/2 tablet on Sundays and Wednesdays.  Recheck in 3 weeks.

## 2010-06-20 NOTE — Assessment & Plan Note (Signed)
Summary: EKG and BP check /jr  Nurse Visit Will show EKG and pt's home BP reading to Dr. Tenny Craw on 12/19/2009 and call patient at home with any MD recommendations.  Vital Signs:  Patient profile:   77 year old female Pulse rate:   57 / minute Resp:     18 per minute BP sitting:   120 / 80  (left arm)  Current Medications (verified): 1)  Crestor 5 Mg Tabs (Rosuvastatin Calcium) .... Take 1/4 Tab Once Daily 2)  Centrum Ultra Womens  Tabs (Multiple Vitamins-Minerals) .... One Tab Daily 3)  Calcium-Vitamin D 600-200 Mg-Unit Tabs (Calcium-Vitamin D) .... One Tab Two Times A Day 4)  Vitamin D 1000 Unit Tabs (Cholecalciferol) .... One Tab Daily 5)  Aspirin 81 Mg Tbec (Aspirin) .... Take One Tablet By Mouth Daily 6)  Fish Oil .... 2 By Mouth Daily 7)  Vitamin C 500 Mg Tabs (Ascorbic Acid) .[name].. 1 By Mouth Daily 8)  Warfarin Sodium 5 Mg Tabs (Warfarin Sodium) .... As Directed 9)  Cordarone 200 Mg Tabs (Amiodarone Hcl) .[name].. 1 Tab Every Day 10)  Boniva 150 Mg Tabs (Ibandronate Sodium) .... Once Monthly 11)  Toprol Xl 25 Mg Xr24h-Tab (Metoprolol Succinate) .[name].. 1 Tab Every Day 12)  Cozaar 100 Mg Tabs (Losartan Potassium) .[name].. 1 Tab Every Day 13)  Amlodipine Besylate 5 Mg Tabs (Amlodipine Besylate) .... Take 1/2 Tablet By Mouth Daily  Allergies: 1)  ! * Xray Dye 2)  ! Macrobid  Orders Added: 1)  EKG w/ Interpretation [93000]  Appended Document: EKG and BP check /jr BP looks good.  Continue.  Patient denies dizziness.  Appended Document: EKG and BP check /jr Patient called.  Told to go up to 5 mg on amlodipine as review of home bps they have higher.

## 2010-06-20 NOTE — Assessment & Plan Note (Signed)
Summary: Patricia [name]   Visit Type:  Follow-up  CC:  no complaints.  Current Medications (verified): 1)  Cartia Xt 240 Mg Xr24h-Cap (Diltiazem Hcl Coated Beads) .... One Tab Daily 2)  Cozaar 50 Mg Tabs (Losartan Potassium) .... One Tab Daily 3)  Crestor 5 Mg Tabs (Rosuvastatin Calcium) .... Take 1/4 Tab Once Daily 4)  Centrum Ultra Womens  Tabs (Multiple Vitamins-Minerals) .... One Tab Daily 5)  Calcium-Vitamin D 600-200 Mg-Unit Tabs (Calcium-Vitamin D) .... One Tab Two Times A Day 6)  Vitamin D 1000 Unit Tabs (Cholecalciferol) .... One Tab Daily 7)  Aspirin 81 Mg Tbec (Aspirin) .... Take One Tablet By Mouth Daily 8)  Flaxseed Oil 1200 Mg Caps (Flaxseed (Linseed)) .... 2 By Mouth Daily 9)  Vitamin C 500 Mg Tabs (Ascorbic Acid) .[name].. 1 By Mouth Daily 10)  Warfarin Sodium 5 Mg Tabs (Warfarin Sodium) .... As Directed 11)  Amiodarone Hcl 400 Mg Tabs (Amiodarone Hcl) .... Take One Tablet By Mouth Daily For 2 Months  Allergies (verified): 1)  ! * Xray Dye 2)  ! Macrobid  Past History:  Past Medical History: Last updated: 01/06/2009 Current Problems:  ATRIAL FIBRILLATION, HX OF (ICD-V12.59) CAD (ICD-414.00) MITRAL VALVE DISORDERS (ICD-424.0) HYPERTENSION, UNSPECIFIED (ICD-401.9) DYSLIPIDEMIA (ICD-272.4) AORTIC STENOSIS, MILD (ICD-424.1) MITRAL VALVE REPLACEMENT, HX OF (ICD-V15.1) BREAST CYST, HX OF (ICD-V13.3)  Vital Signs:  Patient profile:   77 year old female Height:      65 inches Weight:      193 pounds BMI:     32.23 Pulse rate:   59 / minute BP sitting:   134 / 73  (left arm) Cuff size:   59regular  Vitals Entered By: Burnett Kanaris, CNA (Oct 07, 2009 9:24 AM)   Other Orders: EKG w/ Interpretation (93000)  Patient Instructions: 1)  Your physician recommends that you schedule a follow-up appointment in: sept 2011 2)  EKG at [facility] Office on JUNE 1st Prescriptions: CORDARONE 200 MG TABS (AMIODARONE HCL) 1 tab every day  #30 x 6   Entered by:   Layne Benton,  RN, BSN   Authorized by:   Sherrill Raring, MD, [facility]   Signed by:   Layne Benton, RN, BSN on 10/07/2009   Method used:   [facility]        [facility] [address] [phone]* (retail)       [address]       [place], [place]  [address]       Ph: [phone] or [phone]       Fax: [phone]   RxID:   [id number] CARTIA XT 120 MG XR24H-CAP (DILTIAZEM HCL COATED BEADS) 1 cap every day  #30 x 6   Entered by:   Layne Benton, RN, BSN   Authorized by:   Sherrill Raring, MD, [facility]   Signed by:   Layne Benton, RN, BSN on 10/07/2009   Method used:   [facility]        [facility]  [address] [phone]* (retail)       [address]       [place], [place]  [address]       Ph: [phone] or [phone]       Fax: [phone]   RxID:   [id number]    Appended Document: Patricia [name] Patient is an 77 year old with a history of probable hypertrophic cardiomyopathy with subaortic stenosis (s/p resection), MV disease (s/p replacement), minimal CAD and PAF.  I last saw her  in March. After that visit she started on Amiodarone to prevent afib. Since starting it she denies palpiations, no chst pains.  No nausea.  No dizziness.  FHx/PMH/SH/meds reviewed.  No change Exam:  Patient is in NAD Neck:  JVP is normal. Lungs:  CTA Cardiac:  RRR.  S1, S2.  No S3.  Gr II/VI systolic murmur Abd:  Supple.  No hepatomegaly Extremeities:  No edema  12 leadEKG:  Sinus bradycardia.  59 bpm.  First degree AV block  PR 238 ms:  LVH with repolarizatio abnormality.  LAFB  Impression 1.  afib.  Now on amiodarone.  i would decrease the Cartia to 120 mg per day.  May indeed back off further  will get EKG in a few wks.  Having coumadin checked at Dr. Carolee Rota office. 2.  HCM:  Tolerating.  Continue meds. 3.  CAD:  Mild by cath. 4.  Dyslipidemia.  Continue crestor.  Impression:   1.  PAF:

## 2010-06-20 NOTE — Medication Information (Signed)
Summary: rov/cb  Anticoagulant Therapy  Managed by: Bethena Midget, RN, BSN Referring MD: Dr Dietrich Pates PCP: Dr Diona Foley MD: Jens Som MD, Arlys John Indication 1: Atrial Fibrillation Lab Used: [facility] [place] Site: [address] INR POC 2.9 INR RANGE 2.0-3.0  Dietary changes: no    Health status changes: no    Bleeding/hemorrhagic complications: no    Recent/future hospitalizations: no    Any changes in medication regimen? yes       Details: on amiodarone for about 4 weeks now   Recent/future dental: no  Any missed doses?: no       Is patient compliant with meds? yes       Allergies: 1)  ! * Xray Dye 2)  ! Macrobid  Anticoagulation Management History:      The patient is taking warfarin and comes in today for a routine follow up visit.  Positive risk factors for bleeding include an age of 75 years or older.  The bleeding index is 'intermediate risk'.  Positive CHADS2 values include History of HTN and Age > 64 years old.  Anticoagulation responsible provider: Jens Som MD, Arlys John.  INR POC: 2.9.  Cuvette Lot#: [id number].  Exp: 09/2010.    Anticoagulation Management Assessment/Plan:      The patient's current anticoagulation dose is Warfarin sodium 5 mg tabs: AS DIRECTED.  The target INR is 2.0-3.0.  The next INR is due 09/14/2009.  Anticoagulation instructions were given to patient.  Results were reviewed/authorized by Bethena Midget, RN, BSN.  She was notified by Bethena Midget, RN, BSN.         Prior Anticoagulation Instructions: INR 2.9. Take 1 tablet daily.  Recheck in 1 week.  Current Anticoagulation Instructions: INR 2.9 Continue 5mg s daily. Recheck in 2 weeks

## 2010-06-20 NOTE — Medication Information (Signed)
Summary: rov/eac  Anticoagulant Therapy  Managed by: Elaina Pattee, PharmD Referring MD: Dr Dietrich Pates PCP: Dr Diona Foley MD: Jens Som MD, Arlys John Indication 1: Atrial Fibrillation Lab Used: [facility] [place] Site: [address] INR POC 2.9 INR RANGE 2.0-3.0  Dietary changes: no    Health status changes: no    Bleeding/hemorrhagic complications: no    Recent/future hospitalizations: no    Any changes in medication regimen? no    Recent/future dental: no  Any missed doses?: no       Is patient compliant with meds? yes       Allergies: 1)  ! * Xray Dye 2)  ! Macrobid  Anticoagulation Management History:      The patient is taking warfarin and comes in today for a routine follow up visit.  Positive risk factors for bleeding include an age of 75 years or older.  The bleeding index is 'intermediate risk'.  Positive CHADS2 values include History of HTN and Age > 1 years old.  Anticoagulation responsible provider: Jens Som MD, Arlys John.  INR POC: 2.9.  Cuvette Lot#: [id number].  Exp: 09/2010.    Anticoagulation Management Assessment/Plan:      The patient's current anticoagulation dose is Warfarin sodium 5 mg tabs: AS DIRECTED.  The target INR is 2.0-3.0.  The next INR is due 08/31/2009.  Results were reviewed/authorized by Elaina Pattee, PharmD.  She was notified by Elaina Pattee, PharmD.         Prior Anticoagulation Instructions: INR 2.6  Continue taking 1 tablet daily.  Return to clinic in 1 week.    Current Anticoagulation Instructions: INR 2.9. Take 1 tablet daily.  Recheck in 1 week.

## 2010-06-20 NOTE — Assessment & Plan Note (Signed)
Summary: EKG only/ check rhythm/jr  Nurse Visit   Vital Signs:  Patient profile:   77 year old female Weight:      194.25 pounds Pulse rate:   58 / minute Resp:     20 per minute BP sitting:   131 / 73  (left arm)  Vitals Entered By: Ollen Gross, RN, BSN (October 19, 2009 10:06 AM)  Impression & Recommendations:  Problem # 1:  ATRIAL FIBRILLATION, HX OF (ICD-V12.59)  Her updated medication list for this problem includes:    Aspirin 81 Mg Tbec (Aspirin) .[name]... Take one tablet by mouth daily    Warfarin Sodium 5 Mg Tabs (Warfarin sodium) .[name]... As directed    Cartia Xt 120 Mg Xr24h-cap (Diltiazem hcl coated beads) .[name]... 1 cap every day    Cordarone 200 Mg Tabs (Amiodarone hcl) .[name]... 1 tab every day Pt. in for an EKG due to  Medication changes:  decrease dose of CartiaXL from 240 mg once day to 120 mg, and Amiodorone hcl from 400mg  once a day to 200 mg daily. B/P L arm 131/73 pulse 57 RR 20/min. Pt. denies any c/o. Dr. Riley Kill DOD read the EKG. DOD recommeds for pt. to be seen by Dr. Tenny Craw in 2 weeks due to having prolong QT on the EKG. Pt. has an appointment for June 23rd at 9:15 AM. Pt aware.    Allergies: 1)  ! * Xray Dye 2)  ! Macrobid

## 2010-06-20 NOTE — Letter (Signed)
Summary: Self-Recorded Vitals  Self-Recorded Vitals   Imported By: Marylou Mccoy 02/14/2010 12:25:30  _____________________________________________________________________  External Attachment:    Type:   Image     Comment:   External Document

## 2010-06-20 NOTE — Progress Notes (Signed)
Summary: pt ankles are swelling  Phone Note Call from Patient Call back at Home Phone [phone]   Caller: Patient Reason for Call: Talk to Nurse, Talk to Doctor Summary of Call: per pt call her ankles are swelling and she wants to talk to someone regarding this Initial call taken by: Omer Jack,  September 12, 2009 9:24 AM  Follow-up for Phone Call        talked with patient--pt has swelling in both ankles starting Saturday-she states they are better in the morning and get worse at the end of the day, pt does not weigh every day but denies abdominal distention or puffiness in hands or face, orthopnea, SOB ,or DOE,pt is on Cartia , pt will keep her feet and legs elevated as much as possible during the day, avoid salt, and weigh daily--pt knows to call if symptoms change-I will forward this to Dr Tenny Craw for review     Appended Document: pt ankles are swelling patient has very mild ankle edema.  No shortness of breath.  No dizzines.  No signif palpitations. Has appt to f/u in May.  Keep appt.  Watch salt. Patient was called. Note daughter had echo today.

## 2010-06-20 NOTE — Progress Notes (Signed)
Summary: refill request  Phone Note Refill Request Message from:  Patient on January 06, 2010 8:50 AM  Refills Requested: Medication #1:  AMLODIPINE BESYLATE 5 MG TABS Take 1/2 tablet by mouth daily. [facility] -pt taking a whole pill now instead of half   Method Requested: Telephone to Pharmacy Initial call taken by: Glynda Jaeger,  January 06, 2010 8:50 AM  Follow-up for Phone Call        I spoke with pt confirmed dose and called it into pharmacy Follow-up by: Burnett Kanaris, CNA,  January 06, 2010 11:05 AM    New/Updated Medications: AMLODIPINE BESYLATE 5 MG TABS (AMLODIPINE BESYLATE) 1 tab once daily Prescriptions: AMLODIPINE BESYLATE 5 MG TABS (AMLODIPINE BESYLATE) 1 tab once daily  #30 x 6   Entered by:   Burnett Kanaris, CNA   Authorized by:   Sherrill Raring, MD, [facility]   Signed by:   Burnett Kanaris, CNA on 01/06/2010   Method used:   [facility] [address] [phone]* (retail)       [address]       [place], [place]  [address]       Ph: [phone] or [phone]       Fax: [phone]   RxID:   [id number]

## 2010-06-20 NOTE — Letter (Signed)
Summary: [facility]  [facility]   Imported By: Marylou Mccoy 03/09/2010 15:14:18  _____________________________________________________________________  External Attachment:    Type:   Image     Comment:   External Document

## 2010-06-20 NOTE — Medication Information (Signed)
Summary: Coumadin Clinic  Anticoagulant Therapy  Managed by: Inactive Referring MD: Dr Dietrich Pates PCP: Dr Diona Foley MD: Ladona Ridgel MD, Sharlot Gowda Indication 1: Atrial Fibrillation Lab Used: [facility] [place] Site: [address] INR RANGE 2.0-3.0          Comments: Patient's PCP office has a pharmacist onsite that has agreed to manage her coumadin.  She has been given instructions on when to have INR checked again.    Allergies: 1)  ! * Xray Dye 2)  ! Macrobid  Anticoagulation Management History:      Positive risk factors for bleeding include an age of 23 years or older.  The bleeding index is 'intermediate risk'.  Positive CHADS2 values include History of HTN and Age > 70 years old.  Anticoagulation responsible provider: Ladona Ridgel MD, Sharlot Gowda.  Exp: 12/2010.    Anticoagulation Management Assessment/Plan:      The patient's current anticoagulation dose is Warfarin sodium 5 mg tabs: AS DIRECTED.  The target INR is 2.0-3.0.  The next INR is due 10/19/2009.  Anticoagulation instructions were given to patient.  Results were reviewed/authorized by Inactive.         Prior Anticoagulation Instructions: INR 3.1  Start NEW dosing schedule of 1/2 tablet on Wednesday and Friday, and 1 tablet all other days.  Recheck INR in 3 weeks.

## 2010-06-20 NOTE — Progress Notes (Signed)
Summary: Resumed Coumadin  Phone Note Call from Patient   Caller: Patient Call For: Coumadin Clinic Summary of Call: Pt called states she resumed Coumadin on 03/02/10 s/p surgery on wrist secondary to break from fall, surgery was on 03/01/10. Pt states she resumed at previous dosage per surgeon's instructions, however she missed her Coumadin on 10/15 and 10/16.  Advised to take 1.5 tablets x 2 doses, then resume same dosage 1 tablet daily except 1/2 tablet on Wednesdays and Satursdays.  Made OV to check PT/INR on 03/13/10.  Initial call taken by: Cloyde Reams RN,  March 06, 2010 10:24 AM

## 2010-06-20 NOTE — Assessment & Plan Note (Signed)
Summary: ABN EKG   Visit Type:  Follow-up Primary Provider:  Dr Renne Crigler  CC:  abn EKG.  History of Present Illness: Patient is an 77 year old woman with a history of minimal CAD, LVH, PAF, subaortic stenosis (s/p resection) and MV dz (s/p MVR.  I last saw her in August of 2010.  SInce then she was seen in [facility] ER with atrial fibrillation.    I saw her last in May. The patient finished 2 months of 400 once daily AMIodarone.  She is now on 200 mg. per day She was seen in coumadin a few days ago.  EKG was done.  It was noted that she was mildly bradycardiac.  QT was aslo noted to be long, though not significantly different than baseline. She was sent back to clinic. The patient denies chest pain.  No dizziness.  She does note occasional isolated skips.  Current Medications (verified): 1)  Cozaar 50 Mg Tabs (Losartan Potassium) .... One Tab Daily 2)  Crestor 5 Mg Tabs (Rosuvastatin Calcium) .... Take 1/4 Tab Once Daily 3)  Centrum Ultra Womens  Tabs (Multiple Vitamins-Minerals) .... One Tab Daily 4)  Calcium-Vitamin D 600-200 Mg-Unit Tabs (Calcium-Vitamin D) .... One Tab Two Times A Day 5)  Vitamin D 1000 Unit Tabs (Cholecalciferol) .... One Tab Daily 6)  Aspirin 81 Mg Tbec (Aspirin) .... Take One Tablet By Mouth Daily 7)  Fish Oil .... 2 By Mouth Daily 8)  Vitamin C 500 Mg Tabs (Ascorbic Acid) .[name].. 1 By Mouth Daily 9)  Warfarin Sodium 5 Mg Tabs (Warfarin Sodium) .... As Directed 10)  Cartia Xt 120 Mg Xr24h-Cap (Diltiazem Hcl Coated Beads) .[name].. 1 Cap Every Day 11)  Cordarone 200 Mg Tabs (Amiodarone Hcl) .[name].. 1 Tab Every Day 12)  Boniva 150 Mg Tabs (Ibandronate Sodium) .... Once Monthly  Allergies (verified): 1)  ! * Xray Dye 2)  ! Macrobid  Past History:  Past Surgical History: Last updated: 01/06/2009  Laparoscopic cholecystectomy- 2009  Senile nuclear cataract, left eye-2003  Mitral valve replacement- 2007    Excision of deep neck mass- 2003.    Family History: Last  updated: 01/06/2009 Negative for premature coronary artery disease.  Social History: Last updated: 01/06/2009 The patient is married, quit tobacco in 1992, does not drink  Past Medical History: Current Problems:  ATRIAL FIBRILLATION, HX OF (ICD-V12.59) CAD (ICD-414.00) MITRAL VALVE DISORDERS (ICD-424.0) HYPERTENSION, UNSPECIFIED (ICD-401.9) DYSLIPIDEMIA (ICD-272.4) AORTIC STENOSIS, MILD (ICD-424.1) MITRAL VALVE REPLACEMENT, HX OF (ICD-V15.1) BREAST CYST, HX OF (ICD-V13.3) Hypertrophic Cardiomyopathy.  Review of Systems       Notes she feels a little sluggish.  Vital Signs:  Patient profile:   77 year old female Height:      65 inches Weight:      195 pounds BMI:     32.57 Pulse rate:   63 / minute BP sitting:   139 / 70  (left arm) Cuff size:   large  Vitals Entered By: Burnett Kanaris, CNA (November 10, 2009 9:35 AM)  Physical Exam  Additional Exam:  Pateint is in NAD. HEENT:  Normocephalic, atraumatic. EOMI, PERRLA.  Neck: JVP is normal. No thyromegaly. No bruits.  Lungs: clear to auscultation. No rales no wheezes.  Heart: Regular rate and rhythm. Normal S1, S2. No S3.   Gr II/VI systolic  murmurs. PMI not displaced.  Abdomen:  Supple, nontender. Normal bowel sounds. No masses. No hepatomegaly.  Extremities:   Good distal pulses throughout. No lower extremity edema.  Musculoskeletal :  moving all extremities.  Neuro:   alert and oriented x3.    EKG  Procedure date:  11/10/2009  Findings:      NSR.  62 bpm.  First degree AV block.  PVCs.  Prolonged QT (517 msec).  QT not signif changed from previous.  Impression & Recommendations:  Problem # 1:  ATRIAL FIBRILLATION, HX OF (ICD-V12.59) Patient continues on amiodarone.  I am not convinced there is a signif change in her QT which was prolonged at baseline.  I have reviewed with J. Allred. I would rec stopping Dilt and adding low dose Toprol to control PVCs  I would get an EKG when she comes to coumadin clinic  again.  Problem # 2:  CAD (ICD-414.00) No sxs of ischemia  Problem # 3:  CARDIOMYOPATHY, HYPERTROPHIC (ICD-425.1) Doing OK.  Relatively asymptomatic.  NOte 2 daughters have had echoes without signs of HOCM.  Problem # 4:  DYSLIPIDEMIA (ICD-272.4) Continue. Her updated medication list for this problem includes:    Crestor 5 Mg Tabs (Rosuvastatin calcium) .[name]... Take 1/4 tab once daily  Other Orders: EKG w/ Interpretation (93000)  Patient Instructions: 1)  Your physician has recommended you make the following change in your medication: stop Cartia .[name]...start TOPROL XL 25MG  EVERY DAY 2)  Your physician recommends that you schedule a follow-up appointment in: EKG with nurse on 7/7 3)  Keep scheduled appointment with Dr.Dolton Shaker Prescriptions: TOPROL XL 25 MG XR24H-TAB (METOPROLOL SUCCINATE) 1 tab every day  #30 x 6   Entered by:   Layne Benton, RN, BSN   Authorized by:   Sherrill Raring, MD, [facility]   Signed by:   Layne Benton, RN, BSN on 11/10/2009   Method used:   [facility]        [facility] [address] [phone]* (retail)       [address]       [place], [place]  [address]       Ph: [phone] or [phone]       Fax: [phone]   RxID:   [id number]

## 2010-06-20 NOTE — Medication Information (Signed)
Summary: rov/ewj  Anticoagulant Therapy  Managed by: Eda Keys, PharmD Referring MD: Dr Dietrich Pates PCP: Dr Diona Foley MD: Myrtis Ser MD, Tinnie Gens Indication 1: Atrial Fibrillation Lab Used: [facility] [place] Site: [address] INR POC 3.1 INR RANGE 2.0-3.0  Dietary changes: no    Health status changes: no    Bleeding/hemorrhagic complications: no    Recent/future hospitalizations: no    Any changes in medication regimen? yes       Details: Pt started on Boniva.  Recent/future dental: no  Any missed doses?: no       Is patient compliant with meds? yes      Comments: Virgina Evener, PharmD at [facility] on [facility].  Dr. Renne Crigler is PCP at this Office.    Allergies: 1)  ! * Xray Dye 2)  ! Macrobid  Anticoagulation Management History:      The patient is taking warfarin and comes in today for a routine follow up visit.  Positive risk factors for bleeding include an age of 75 years or older.  The bleeding index is 'intermediate risk'.  Positive CHADS2 values include History of HTN and Age > 44 years old.  Anticoagulation responsible provider: Myrtis Ser MD, Tinnie Gens.  INR POC: 3.1.  Cuvette Lot#: [id number].  Exp: 12/2010.    Anticoagulation Management Assessment/Plan:      The patient's current anticoagulation dose is Warfarin sodium 5 mg tabs: AS DIRECTED.  The target INR is 2.0-3.0.  The next INR is due 10/19/2009.  Anticoagulation instructions were given to patient.  Results were reviewed/authorized by Eda Keys, PharmD.  She was notified by Eda Keys.         Prior Anticoagulation Instructions: INR 3.1  Start taking 1/2 tablet on Wednesday, and 1 tablet all other days.  Recheck in 2 weeks.    Current Anticoagulation Instructions: INR 3.1  Start NEW dosing schedule of 1/2 tablet on Wednesday and Friday, and 1 tablet all other days.  Recheck INR in 3 weeks.

## 2010-06-22 NOTE — Miscellaneous (Signed)
  Clinical Lists Changes  Medications: Added new medication of SYNTHROID 25 MCG TABS (LEVOTHYROXINE SODIUM) 1 tab every day - Signed Rx of SYNTHROID 25 MCG TABS (LEVOTHYROXINE SODIUM) 1 tab every day;  #30 x 6;  Signed;  Entered by: Layne Benton, RN, BSN;  Authorized by: Sherrill Raring, MD, [facility];  Method used[facility]  [address] [phone]*, [address], [place], [place]  [address], Ph: [phone] or [phone], Fax: [phone]    Prescriptions: SYNTHROID 25 MCG TABS (LEVOTHYROXINE SODIUM) 1 tab every day  #30 x 6   Entered by:   Layne Benton, RN, BSN   Authorized by:   Sherrill Raring, MD, [facility]   Signed by:   Layne Benton, RN, BSN on 06/09/2010   Method used:   [facility] to        [facility]  [address] [phone]* (retail)       [address]       [place], [place]  [address]       Ph: [phone] or [phone]       Fax: [phone]   RxID:   [id number]

## 2010-06-22 NOTE — Medication Information (Signed)
Summary: Coumadin Clinic  Anticoagulant Therapy  Managed by: Inactive Referring MD: Dr Dietrich Pates PCP: Dr Diona Foley MD: Tenny Craw MD, Gunnar Fusi Indication 1: Atrial Fibrillation Lab Used: [facility]  Site: [address] INR RANGE 2.0-3.0          Comments: Pt following INR with PCP.   Allergies: 1)  ! * Xray Dye 2)  ! Macrobid  Anticoagulation Management History:      Positive risk factors for bleeding include an age of 55 years or older.  The bleeding index is 'intermediate risk'.  Positive CHADS2 values include History of HTN and Age > 77 years old.  Anticoagulation responsible Maila Dukes: Tenny Craw MD, Gunnar Fusi.  Exp: 03/2011.    Anticoagulation Management Assessment/Plan:      The patient's current anticoagulation dose is Warfarin sodium 5 mg tabs: AS DIRECTED.  The target INR is 2.0-3.0.  The next INR is due 04/10/2010.  Anticoagulation instructions were given to patient.  Results were reviewed/authorized by Inactive.         Prior Anticoagulation Instructions: INR 2.4  Continue on same dosage 1 tablet daily except 1/2 tablet on Sundays and Wednesdays.  Recheck in 4 weeks.

## 2010-06-22 NOTE — Assessment & Plan Note (Signed)
Summary: 4 MONTH ROV/SL   Visit Type:  4 mo f/u Primary Provider:  Dr Renne Crigler  CC:  chest pain Christmas day btwn breast.....denies any other complaints today.  History of Present Illness: Patient is a 77 year old with a history of HOCM, very mild CAD (cath 2008, 30% narrowings) hypertension, PAF, dyslipidemia.  I saw her in clinic earlier this year. Since seen she has broken her L arm and had surgery. She had one episode of chest discomfort around Xmas, not associated with activity/   ? GI.  Has not had since.   Denies SOB.  No palpittions.  No dizziness.  No CP   Current Medications (verified): 1)  Crestor 5 Mg Tabs (Rosuvastatin Calcium) .... Take 1/4 Tab Once Daily 2)  Centrum Ultra Womens  Tabs (Multiple Vitamins-Minerals) .... One Tab Daily 3)  Calcium-Vitamin D 600-200 Mg-Unit Tabs (Calcium-Vitamin D) .... 2 Tab Once Daily 4)  Vitamin D 1000 Unit Tabs (Cholecalciferol) .... One Tab Daily 5)  Aspirin 81 Mg Tbec (Aspirin) .... Take One Tablet By Mouth Daily 6)  Fish Oil 1000 Mg Caps (Omega-3 Fatty Acids) .... 2 Caps Once Daily 7)  Vitamin C 500 Mg Tabs (Ascorbic Acid) .[name].. 1 Tab Two Times A Day 8)  Warfarin Sodium 5 Mg Tabs (Warfarin Sodium) .... As Directed 9)  Cordarone 200 Mg Tabs (Amiodarone Hcl) .[name].. 1 Tab Every Day 10)  Cozaar 100 Mg Tabs (Losartan Potassium) .[name].. 1 Tab Every Day 11)  Amlodipine Besylate 5 Mg Tabs (Amlodipine Besylate) .[name].. 1 Tab Once Daily 12)  Sertraline Hcl 50 Mg Tabs (Sertraline Hcl) .[name].. 1 Tab Once Daily  Allergies: 1)  ! * Xray Dye 2)  ! Macrobid  Past History:  Past medical, surgical, family and social histories (including risk factors) reviewed, and no changes noted (except as noted below).  Past Medical History: Reviewed history from 11/10/2009 and no changes required. Current Problems:  ATRIAL FIBRILLATION, HX OF (ICD-V12.59) CAD (ICD-414.00) MITRAL VALVE DISORDERS (ICD-424.0) HYPERTENSION, UNSPECIFIED (ICD-401.9) DYSLIPIDEMIA  (ICD-272.4) AORTIC STENOSIS, MILD (ICD-424.1) MITRAL VALVE REPLACEMENT, HX OF (ICD-V15.1) BREAST CYST, HX OF (ICD-V13.3) Hypertrophic Cardiomyopathy.  Past Surgical History: Reviewed history from 01/06/2009 and no changes required.  Laparoscopic cholecystectomy- 2009  Senile nuclear cataract, left eye-2003  Mitral valve replacement- 2007    Excision of deep neck mass- 2003.    Family History: Reviewed history from 01/06/2009 and no changes required. Negative for premature coronary artery disease.  Social History: Reviewed history from 01/06/2009 and no changes required. The patient is married, quit tobacco in 1992, does not drink  Review of Systems       Reviewed.  Neg to the above problem except as noted above. Does note her hair is falling out.  Wonders if it is meds. No nightmares  Vital Signs:  Patient profile:   77 year old female Height:      65 inches Weight:      187.50 pounds BMI:     31.31 Pulse rate:   66 / minute Pulse rhythm:   irregular BP sitting:   102 / 66  (left arm) Cuff size:   regular  Vitals Entered By: Danielle Rankin, CMA (May 26, 2010 9:12 AM)  Physical Exam  Additional Exam:  Patient is in NAD HEENT:  Normocephalic, atraumatic. EOMI, PERRLA.  Neck: JVP is normal. No thyromegaly. No bruits.  Lungs: clear to auscultation. No rales no wheezes.  Heart: Regular rate and rhythm. Normal S1, S2. No S3.   Gr  III/VI sys mumur Lsb to apex/base. PMI not displaced.  Abdomen:  Supple, nontender. Normal bowel sounds. No masses. No hepatomegaly.  Extremities:   Good distal pulses throughout. No lower extremity edema.  Musculoskeletal :moving all extremities.  Neuro:   alert and oriented x3.    EKG  Procedure date:  05/26/2010  Findings:      NSR.  66 bpm.  First degree AV block PR 232.  LAFB.  LVH.  Spetal infarct.  Occasional PVC.  Impression & Recommendations:  Problem # 1:  ATRIAL FIBRILLATION, HX OF (ICD-V12.59) No evid to sugg a  recurrence.  Keep on Amio but decrease dose to 100 mg per day.  Stay on coumadin T-TSH [phone]) T- * Misc. Laboratory test [phone])  Problem # 2:  CARDIOMYOPATHY, HYPERTROPHIC (ICD-425.1) Hemodyn appear to be tolerated.  Keep on same regimen.  Last echo in March 2011  Problem # 3:  CAD (ICD-414.00) Milld CAD by cath 2008.  Episode of discomfort was atyp.   COntinue meds.  Problem # 4:  HYPERTENSION, UNSPECIFIED (ICD-401.9) Controlled.  I do not think meds lead to hair loss.  Discussed with S. Putt. Will check TSH, ANA, Vit D. Her updated medication list for this problem includes:    Aspirin 81 Mg Tbec (Aspirin) .[name]... Take one tablet by mouth daily    Cozaar 100 Mg Tabs (Losartan potassium) .[name]... 1 tab every day    Amlodipine Besylate 5 Mg Tabs (Amlodipine besylate) .[name]... 1 tab once daily  Problem # 5:  DYSLIPIDEMIA (ICD-272.4) Good control. Her updated medication list for this problem includes:    Crestor 5 Mg Tabs (Rosuvastatin calcium) .[name]... Take 1/4 tab once daily  Other Orders: EKG w/ Interpretation (93000) T-Antinuclear Antib (ANA) ([id number])  Patient Instructions: 1)  Your physician recommends that you return for lab work in: lab work today....we will calll you with results 2)  Your physician has recommended you make the following change in your medication: decrease Amiodarone to 100mg  every day. 3)  Your physician wants you to follow-up in:end of August 2012   You will receive a reminder letter in the mail two months in advance. If you don't receive a letter, please call our office to schedule the follow-up appointment.

## 2010-06-22 NOTE — Progress Notes (Signed)
Summary: sided effect from meds  Phone Note Call from Patient Call back at Home Phone [phone]   Caller: Patient Reason for Call: Talk to Nurse Summary of Call: sided effect from meds. -SYNTHROID 25 MCG. dizziness.  Initial call taken by: Lorne Skeens,  June 13, 2010 11:15 AM  Follow-up for Phone Call        Pt. call th find out if  dizziness is a side effect of Synthroid. Pt. started taken this medication 06/09/10. Pt states when she go from sitting to standing position she gets dizzy. B/P 117/69 to 118/60 pulse 66 beats per min. According to the PDR, dizziness is not an adverse reaction for Synthroid. Rn advice pt. to see her PCP to see what else is causing her dizziness. Pt. verbalized understanding. Follow-up by: Ollen Gross, RN, BSN,  June 13, 2010 11:47 AM  Additional Follow-up for Phone Call Additional follow up Details #1::        Called patient  She was not dizzy today or yesterday.  Tolld her to keep on Synthroid. Additional Follow-up by: Sherrill Raring, MD, [facility],  June 15, 2010 10:13 AM

## 2010-08-09 LAB — POCT CARDIAC MARKERS
CKMB, poc: 1.6 ng/mL (ref 1.0–8.0)
Troponin i, poc: <0.05 ng/mL (ref 0.00–0.09)

## 2010-08-09 LAB — CBC
HCT: 46.4 % — ABNORMAL HIGH (ref 36.0–46.0)
Hemoglobin: 15.8 g/dL — ABNORMAL HIGH (ref 12.0–15.0)
RBC: 5.01 MIL/uL (ref 3.87–5.11)
RDW: 13.1 % (ref 11.5–15.5)

## 2010-08-09 LAB — DIFFERENTIAL
Basophils Absolute: 0 10*3/uL (ref 0.0–0.1)
Eosinophils Relative: 4 % (ref 0–5)
Lymphocytes Relative: 12 % (ref 12–46)
Monocytes Absolute: 0.4 10*3/uL (ref 0.1–1.0)
Monocytes Relative: 5 % (ref 3–12)
Neutro Abs: 5.7 10*3/uL (ref 1.7–7.7)

## 2010-08-09 LAB — BASIC METABOLIC PANEL
BUN: 10 mg/dL (ref 6–23)
GFR calc Af Amer: >60 mL/min (ref >60)
Potassium: 3.8 mEq/L (ref 3.5–5.1)

## 2010-09-21 ENCOUNTER — Telehealth: Payer: Self-pay

## 2010-09-21 NOTE — Telephone Encounter (Signed)
Message copied by Cloyde Reams on Thu Sep 21, 2010 11:12 AM ------      Message from: Dietrich Pates      Created: Thu Sep 21, 2010 10:48 AM      Contact: [phone]       Make sure she has f/u appt.      ----- Message -----         From: Luan Moore, RN         Sent: 09/20/2010   9:04 AM           To: Gerome Apley, RN, Pricilla Riffle, MD            Pt was having incr swelling in B lower extremities.  Dr Tenny Craw was not in office she went to see her primary care MD and they discontinued her Amlodipine secondary to edema.  Edema has improved since d/c of Amlodipine.  Primary MD advised pt to monitor BP's and to follow-up with Dr Tenny Craw soon.

## 2010-10-03 NOTE — Op Note (Signed)
NAME:  KEMBA, HOPPES                 ACCOUNT NO.:  [id number]   MEDICAL RECORD NO.:  [id number]          PATIENT TYPE:  AMB   LOCATION:  SDS                          FACILITY:  [facility]   PHYSICIAN:  Ollen Gross. Vernell Morgans, M.D. DATE OF BIRTH:  06/29/1926   DATE OF PROCEDURE:  10/02/2007  DATE OF DISCHARGE:  10/02/2007                               OPERATIVE REPORT   PREOPERATIVE DIAGNOSIS:  Gallstones.   POSTOPERATIVE DIAGNOSIS:  Gallstones.   PROCEDURES:  Laparoscopic cholecystectomy with intraoperative  cholangiogram.   SURGEON:  Ollen Gross. Vernell Morgans, MD   ASSISTANT:  Leonie Man, MD   ANESTHESIA:  General endotracheal.   PROCEDURE:  After informed consent was obtained, the patient was brought  to the operating placed in the supine position on the operating table.  After adequate induction of general anesthesia, the patient's abdomen  was prepped with Hibiclens and draped in usual sterile manner.  The area  below the umbilicus was infiltrated with 0.25% Marcaine.  A small  incision was made with a #15 blade knife.  This incision was carried  down through the subcutaneous tissue bluntly with a hemostat and [employer] retractors until the linea was identified.  The linea was incised  with a #15 blade knife and each side was grasped with Kocher clamps, and  elevated anteriorly.  The preperitoneal space was then probed bluntly  with a hemostat.  The peritoneum was opened access was gained to the  abdominal cavity.  A 0 Vicryl pursestring stitch was placed in the  fascia around the opening. A Hasson cannula was placed through the  opening anchored in place at the previous with 0 Vicryl pursestring  stitch.  The abdomen was then insufflated with carbon dioxide without  difficulty.  A laparoscope was inserted through the Hasson cannula, and  the right upper quadrant was inspected, and the dome of gallbladder and  liver were readily identified.  Next, the epigastric region was  infiltrated  with 0.25% Marcaine.  A small incision was made with a #15  blade knife and a 10-mm port was placed bluntly through this incision  into the abdominal cavity under direct vision.  The sites were then  chosen laterally on the right side of the abdomen for placement 5-mm  ports.  Each of these areas were infiltrated with 0.25% Marcaine.  Small  stab incisions were made with a #15 blade knife and  5-mm ports were  placed bluntly through these incisions into the abdominal cavity under  direct vision.  A blunt grasper was placed through the lateral most 5-mm  port and used to grasp the dome of gallbladder and elevated anteriorly  and superiorly.  Another blunt grasper was placed through the other 5-mm  port and used to retract on the body and neck of the gallbladder.  A  dissector was placed through the epigastric port and using  electrocautery.  The peritoneal reflection of the gallbladder neck area  was opened.  Blunt dissection was then carried out in this area until  the gallbladder neck cystic duct junction was readily  identified, and a  good window was created.  A single clip was placed on the gallbladder  neck.  A small ductotomy was made just below the clip.  A 14-gauge  Angiocath was placed percutaneously through the anterior abdominal wall  under direct vision.  A Reddick cholangiogram catheter was then placed  through the Angiocath and flushed.  The Reddick catheter then placed in  the cystic duct and anchored in place the clip.  Cholangiogram was  obtained that showed no filling defects, good emptying in the duodenum,  and adequate length on the cystic duct, and the anchoring clip and  catheters were removed from the patient.  Three clips were placed  proximally on the cystic duct and duct was divided into 2 sets of clips.  Posterior to this, the cystic artery was identified and again dissected  bluntly in a circumferential manner until a good window was created.  Two clips placed  proximally and distally on the artery, and the artery  was divided between the two.  Next, a laparoscopic hook cautery device  was used to separate the gallbladder from the liver bed prior to  completely detaching the gallbladder from the liver bed.  The liver bed  was inspected and several small bleeding points were coagulated with  electrocautery until the area was completely hemostatic.  The  gallbladder was then detached from its site from liver bed without  difficulty with a hook cautery.  Laparoscopic bag was inserted through  the epigastric port.  The gallbladder was placed in the bag and the bag  was sealed.  The abdomen was irrigated with copious amounts of saline  until the effluent was clear.  The laparoscope was then moved to the  epigastric port.  A gallbladder grasper was placed through Hasson  cannula and used the grasp to open the bag.  The bag with the  gallbladder was removed through the infraumbilical port without  difficulty.  The fascial defect was then closed with previously stitched  Vicryl pursestring stitch as well as with another figure-of-eight 0  Vicryl stitch, and the rest of ports were removed under direct vision  were found to be hemostatic.  The gas was allowed to escape.  The skin  incisions were all closed with interrupted 4-0 Monocryl subcuticular  stitches.  Dermabond dressings were applied.  The patient tolerated the  procedure well.  At the end of the case, all needle, sponges, and  instrument counts were correct.  The patient was then awakened, taken to  the recovery room in stable condition.      Ollen Gross. Vernell Morgans, M.D.  Electronically Signed     PST/MEDQ  D:  10/02/2007  T:  10/02/2007  Job:  [id number]

## 2010-10-03 NOTE — Assessment & Plan Note (Signed)
[facility]ALTHCARE                            CARDIOLOGY OFFICE NOTE   NAME:Patricia [name], Patricia [name]                        MRN:          [id number]  DATE:07/24/2007                            DOB:          1927-04-03    IDENTIFICATION:  Ms. Crownover is a 77 year old woman who I last saw back in  the fall, October 2008.  She has a history mitral valve disease (status  post replacement with a bioprosthesis), history of subaortic stenosis  status post resection, mild aortic stenosis.  She is also status post  PFO closure (all in 2007).   Since then, she has done well from a cardiac standpoint. Her breathing  is okay.  No chest pain.  No palpitations.  She had a gallbladder attack  in January and is being evaluated for possible laparoscopic  cholecystectomy.   CURRENT MEDICINES:  1. Vitamin C.  2. Vitamin D.  3. Actonel q. month.  4. Crestor 1/4 of a 5 mg.  5. Flaxseed oil.  6. Cartia XT 240.  7. Cozaar 5.  8. Aspirin 81.  9. Multivitamin.  10.Calcium with D.   PHYSICAL EXAM:  The patient is in no distress.  Blood pressure is 132/78, pulse is 80 and regular, weight 192.  LUNGS:  Clear.  CARDIAC:  Grade 2-3/6 systolic murmur heard best at the base. Also  reverberates to the apex.  No diastolic rumbles.  ABDOMEN:  Benign, no hepatomegaly.  No right upper quadrant tenderness.  EXTREMITIES:  No edema.   A 12-lead EKG shows normal sinus rhythm 68 beats per minute.  Left  anterior fascicular block.  LVH.  Septal MI.   IMPRESSION:  1. Mitral valve disease status post replacement. No evidence of      stenosis on exam. She does have an increased gradient through the      LV outflow tract and had a subaortic resection. Her echocardiogram      from the fall actually showed an increased gradient through this      region peak of about 49 mmHg. I would get another echo just to      reassess but overall it does not appear to be clinically      significant.  From a cardiac  standpoint, I think she should do okay with surgery.  Again close attention to volume status (avoiding overhydration and  dehydration).  I will send a copy to Dr. Carolynne Edouard.  The patient will need antibiotics before surgical work.  1. Health care maintenance. Lipid panel from February 8 Dr. Carolee Rota      office, total cholesterol 226, triglycerides were 251.  The patient      claims she is eating a lot of sweets. LDL was 136, VLDL of 50.  I      would recommend a statin therapy.  She is achy now on the Crestor.      I recommend trying to stop this and start her on Pravachol 10 mg      after the surgery and see how she tolerates it. Again she did not  any significant coronary disease but should goal to keep things      quiet.  2. Hypertension, adequate control.   Tentatively I will set to see the patient back in the fall. Again if she  starts on the statin and tolerates, we will get lipids and I will be in  touch with her. Also follow up with the echocardiogram. No further  testing will be needed before surgery.     Pricilla Riffle, MD, [facility]  Electronically Signed    PVR/MedQ  DD: 07/24/2007  DT: 07/24/2007  Job #: [id number]   cc:   Ollen Gross. Vernell Morgans, M.D.  Soyla Murphy. Renne Crigler, M.D.

## 2010-10-03 NOTE — Assessment & Plan Note (Signed)
[place] [facility]                            CARDIOLOGY OFFICE NOTE   NAME:[name], Patricia HANDLEY                        MRN:          [id number]  DATE:04/05/2008                            DOB:          July 06, 1926    IDENTIFICATION:  The patient is a 77 year old woman, I last saw her back  in March 2009.  She has a history of mitral valve replacement  (bioprosthesis, history of subaortic stenosis) status post resection,  mild aortic stenosis, history of PFO status post closure.  Note, last  echocardiogram was done in March, as well as after I saw her mitral  valve prosthesis moved well.  The LV appeared to be hyperdynamic and  there was an increased gradient through the LV outflow tract with no  peak gradients noted on echo.   Since seen she is complaining of sinus infection.  She has got quite a  bit of drainage down her throat.  She is taking Mucinex.  Denies chest  pressure, no dizziness.  Notes a low grade fever earlier today 99.   CURRENT MEDICINES:  1. Vitamin C.  2. Vitamin D.  3. Crestor 1.25 mg.   PHYSICAL EXAMINATION:  GENERAL:  The patient is in no distress.  VITAL SIGNS:  Blood pressure 107/64, pulse is 72, weight 185 down from  192.  NECK:  JVP is normal.  LUNGS:  Grade 2 to 3/6 systolic murmur, best at the left sternal border  radiating to the base.  ABDOMEN:  Benign.  No hepatomegaly.  EXTREMITIES:  No edema.   IMPRESSION:  1. Cardiac status post valve replacement, still with hyperdynamic left      ventricle and some dynamic gradient through the left ventricular      outflow tract.  She is asymptomatic.  I think from this I would      continue to follow.  2. Dyslipidemia, followed by Dr. Renne Crigler as well.  She had problems with      5 mg of Crestor, she is now on 1.25, I would need to see what her      lipids are.  Again, I would treat to prevent any problems.  3. Hypertension, adequate control.  4. Question sinus infection.  She is coughing  up mucus, blowing out      some greenish sputum with her other medical issues.  I have given      her a Z-Pak, I have also given her prescription for Nasonex.  On      exam today actually her turbinates are somewhat inflamed.   I will also again start to see the patient back in the summer, sooner if  problems develop.     Pricilla Riffle, MD, [facility]  Electronically Signed    PVR/MedQ  DD: 04/05/2008  DT: 04/06/2008  Job #: [id number]   cc:   Soyla Murphy. Renne Crigler, M.D.

## 2010-10-03 NOTE — Assessment & Plan Note (Signed)
[place] [facility]                            CARDIOLOGY OFFICE NOTE   NAME:Patricia [name], Patricia [name]                        MRN:          [id number]  DATE:03/20/2007                            DOB:          May 23, 1926    IDENTIFICATION:  The patient is a 77 year old woman with a history of  mitral valve disease (status post replacement with bioprosthesis),  subaortic stenosis (status post resection), mild aortic stenosis.  She  is also status post PFO closure.  This was all in 2007.  I last saw the  patient back in September.   When I saw her, she had some spells of shortness of breath.  I went  ahead and recommended an event monitor and echocardiogram.   She has worn the event monitor and has had no significant arrhythmia  noted.  She notes rare palpitations.   Her echocardiogram was done in September and I have reviewed it today.  Mitral valve prosthesis is working well with a mean gradient of 7 mmHg.  The struts deviate toward the septum.  The LVOT is narrow and there is  some increased velocity through this area with a mean gradient of  approximately 29-30 mmHg.  LV function is good.  Note, estimated PA  pressure is 39.   On talking to the patient she feels about the same.  Denies dizziness.  No significant shortness of breath.  No chest pain.   CURRENT MEDICATIONS:  1. Vitamin C.  2. Vitamin D.  3. Multivitamin.  4. Zocor 10 mg daily.  5. Flax seed oil.  6. Omega-3.  7. Aspirin 81 mg.  8. Cardizem 240 mg.  9. Cozaar 50 mg.   PHYSICAL EXAMINATION:  GENERAL:  The patient is in no distress.  VITAL SIGNS:  Blood pressure 126/78, pulse 75, weight 194 which is up  from 172? in September.  LUNGS:  Clear.  HEART:  Regular rate and rhythm.  Grade 2-3/6 systolic murmur heard best  at the left sternal border radiating to the outflow tract.  ABDOMEN:  Benign.  EXTREMITIES:  No edema.   IMPRESSION:  1. Mitral valve disease, status post replacement.   Prosthesis appears      to be opening well.  Note, there is some deviation with what looks      like some functional obstruction to the left ventricular outflow      tract from the struts. The patient has narrow left ventricular      outflow tract and again some obstruction to outflow.  It is not      severe.  We will need to follow.  Again cautioned her on      dehydration and cautioned her on overheating.  Continue on her      current regimen.  2. Health care maintenance.  Would continue on low dose Zocor.  Again      her lipid panel is very good on this low dose.  Plan to avoid any      further problems down the road.  I will set up to see the patient  back otherwise in the spring, sooner if problems develop.     Pricilla Riffle, MD, [facility]  Electronically Signed    PVR/MedQ  DD: 03/20/2007  DT: 03/21/2007  Job #: [id number]

## 2010-10-03 NOTE — Op Note (Signed)
NAME:  Patricia [name], Patricia [name]                 ACCOUNT NO.:  [id number]   MEDICAL RECORD NO.:  [id number]          PATIENT TYPE:  AMB   LOCATION:  ENDO                         FACILITY:  [facility]   PHYSICIAN:  Georgiana Spinner, M.D.    DATE OF BIRTH:  [birth year]   DATE OF PROCEDURE:  DATE OF DISCHARGE:                               OPERATIVE REPORT   PROCEDURE:  Colonoscopy.   INDICATIONS:  Colon polyps.   ANESTHESIA:  Fentanyl 75 mcg, Versed 7.5 mg.   PROCEDURE:  With the patient mildly sedated in the left lateral  decubitus position, the Pentax videoscopic pediatric colonoscope was  inserted in the rectum and passed under direct vision to the cecum,  identified by the ileocecal valve and appendiceal orifice, both which  were photographed.  In the cecum was a small polyp that was not  photographed unfortunately, but was removed using snare technique.  The  nurse pulled through the polyp without applying cautery, so we got some  localized bleeding.  I used a biopsy forceps then to grasp this area and  despite using the cautery, there was still some bleeding.  Therefore I  injected 2 mL of Afrin into the site.  Good blanching was noted and the  bleeding stopped.  From this point then the colonoscope was slowly  withdrawn, taking circumferential views of the colonic mucosa, stopping  only in the rectum, which appeared normal on direct and showed  hemorrhoids on retroflexed view.  The endoscope was straightened and  withdrawn.  The patient's vital signs, pulse oximeter remained stable.  The patient tolerated the procedure well without apparent complication.  No local bleeding.   PLAN:  Await biopsy report.  The patient will call me for results and  follow up with me as an outpatient.  Will hold aspirin for 2 weeks.           ______________________________  Georgiana Spinner, M.D.     GMO/MEDQ  D:  11/03/2008  T:  11/03/2008  Job:  [id number]

## 2010-10-03 NOTE — Assessment & Plan Note (Signed)
[place] [facility]                            CARDIOLOGY OFFICE NOTE   NAME:Patricia [name], Patricia [name]                        MRN:          [id number]  DATE:02/03/2007                            DOB:          [birth year]    IDENTIFICATION:  Patricia [name] is a 77 year old woman with a history of  mitral valve disease (status post replacement with bioprosthesis,  subaortic stenosis (status post resection), mild aortic stenosis, status  post closure of PFO.  These procedures were done in 2007.  The patient  had minimal CAD.   The patient was last seen in May.  Since seen, she has had some spells  of shortness of breath with exertion.  Question if they are a little  more pronounced.  No chest pain.   CURRENT MEDICATIONS:  1. Mucinex p.r.n.  2. Calcium with D daily.  3. Multivitamin daily.  4. Zocor 10 mg daily.  5. Flax seed 100 mg b.i.d.  6. Omega-3.  7. Aspirin 81 mg daily.  8. Cardizem 240 mg.  9. Cozaar 50 mg.   PHYSICAL EXAMINATION:  GENERAL:  The patient is in no distress.  VITAL SIGNS:  Blood pressure 127/74, pulse 172 down from 182 on last  check.  NECK:  JVP is normal.  LUNGS:  Clear.  No rales.  HEART:  Regular rate and rhythm.  Grade 2/6 systolic murmur heard best  at the left sternal border radiating up the outflow tract.  ABDOMEN:  Benign.  EXTREMITIES:  No edema.   12-lead EKG; normal sinus rhythm, 73 beats per minute, slight ST  depression in V4, V5, V6, 2, 3, and F.   Note, the patient has had some of these changes before when she was in  atrial fibrillation postprocedure.   IMPRESSION:  1. Patricia [name] is a 77 year old woman with valvular disease as noted,      minimal coronary artery disease with shortness of breath.  On      examination, she has a murmur through the outflow tract.  I do not      hear any evidence of mitral stenosis.  I would recommend an      echocardiogram to evaluate.  I also question if she is having      bursts of atrial  fibrillation and I would like to set her up for a      monitor to evaluate.  I told her that I would be in touch with her      regarding the test results, continue on current regimen for now.  2. Health care maintenance.  We will check a BMET.  Continue on Zocor.      We will need to review for last lipid panel.     Pricilla Riffle, MD, [facility]  Electronically Signed    PVR/MedQ  DD: 02/03/2007  DT: 02/03/2007  Job #: [id number]

## 2010-10-06 NOTE — Assessment & Plan Note (Signed)
[place] [facility]                            CARDIOLOGY OFFICE NOTE   NAME:[name], Patricia VANLIEW                        MRN:          [id number]  DATE:09/23/2006                            DOB:          08/05/1926    IDENTIFICATION:  Patricia [name] is a 77 year old woman with a history of  mitral valve disease (status post replacement with bioprosthesis), mild  aortic stenosis, status post resection of subaortic stenosis and closure  of PFO.  I last saw her back in January.   In the interval, she has done okay.  She denies dizziness.  No shortness  of breath.  She takes her blood pressure at home and it has been in the  119 to 120s over 60 to 70s.  She does note problems with a dry cough.  She thinks it is since surgery.  She never mentioned this before.  She  brings in an article today to discuss.   CURRENT MEDICATIONS:  1. Altace 5 b.i.d.  2. Mucinex 600 b.i.d. p.r.n.  3. Calcium with D daily.  4. Vitamin C daily.  5. Multivitamin daily.  6. Zocor 10 daily.  7. Flax seed oil 100 b.i.d.  8. Omega-3 500 daily.  9. Aspirin 81 mg daily.  10.Actonel 325 every week.  11.Cardizem 240 daily.   PHYSICAL EXAMINATION:  GENERAL:  The patient is in no distress.  VITAL SIGNS:  Blood pressure 138/78, pulse is 69 regular, weight 182 up  from 179 in January.  LUNGS:  Clear.  CARDIAC:  Regular rate and rhythm.  S1 S2.  A grade 1/6 systolic murmur  heard best at the apex.  A grade 2/6 systolic murmur heard best at the  left upper sternal border radiating out to the outflow tract.  ABDOMEN:  Benign.  EXTREMITIES:  No edema.   IMPRESSION:  1. Hypertension, adequate control at home.  With the cough, I have      given her sample of Cozaar 50 and a prescription.  She will call at      the end of the week to see if coming off of the Altace her cough      goes away. We may need to adjust the Cozaar dose and I have      notified her if her blood pressures are high to call, we may  need      to go up to 100 daily.  2. Mitral valve disease, status post mitral valve replacement.      Clinically doing well.  3. Mild aortic stenosis with a history of subaortic muscular      hypertrophy with resection.  On exam, I still hear flow through the      outflow tract of the aortic valve.  Again, I am not convinced it is      severe.  She is asymptomatic.  We will follow periodic      echocardiograms.  4. Dyslipidemia.  I do not have the lipid panel that she had in April,      but it is very good control, would continue.  I will set to see the patient back at the end of September, sooner if  problems develop.  She will call again as noted.     Pricilla Riffle, MD, [facility]  Electronically Signed    PVR/MedQ  DD: 09/23/2006  DT: 09/23/2006  Job #: [id number]

## 2010-10-06 NOTE — Op Note (Signed)
   NAME:  Patricia [name], Patricia [name]                           ACCOUNT NO.:  [id number]   MEDICAL RECORD NO.:  [id number]                   PATIENT TYPE:  AMB   LOCATION:  DSC                                  FACILITY:  [facility]   PHYSICIAN:  Christopher E. Ezzard Standing, M.D.         DATE OF BIRTH:  05-Oct-1926   DATE OF PROCEDURE:  03/03/2002  DATE OF DISCHARGE:                                 OPERATIVE REPORT   PREOPERATIVE DIAGNOSIS:  Left neck mass.   POSTOPERATIVE DIAGNOSIS:  Left neck mass; clinical findings consistent with  lipoma.   OPERATION:  Excision of deep left neck mass.   SURGEON:  Kristine Garbe. Ezzard Standing, M.D.   ANESTHESIA:  General endotracheal.   COMPLICATIONS:  None.   BRIEF CLINICAL NOTE:  The patient is a 77 year old  female who has had a  left neck mass now for several months.  It measures approximately 2-2.5 cm  in size, is soft, located along the mid jugular chain of lymph nodes on the  left side of the neck.  Clinically it is consistent with an enlarged lymph  node versus possible lipoma.  She is taken to the operating room at this  time for excision of left neck mass.   DESCRIPTION OF OPERATION:  After adequate endotracheal anesthesia the  patient received 1 gram of Ancef IV preoperatively.  The neck was prepped  with Betadine solution and draped out with sterile towels.  A horizontal  incision was made directly over the mass.  Subcutaneous tissue and platysma  muscle were divided, and the mass was just deep to the platysma muscle.  It  was consistent with fatty tissue, had more of a white hue versus a yellow  hue of the surrounding normal appearing fat.  The mass was excised and sent  to pathology.   Hemostasis was done with cautery and the wound was closed with 3-0 Prolene  sutures subcutaneously and 5-0 nylon on the skin.  Bacitracin ointment and  dressing were applied.   The patient was awakened from anesthesia and transferred to recovery and  postop doing well.   DISPOSITION:  The patient is discharged home later this morning.  We will  have her follow up in my office in six days for recheck, review pathology  and have sutures removed.  She was given Tylenol and Tylenol #3 p.r.n. pain.                                                 Kristine Garbe. Ezzard Standing, M.D.    CEN/MEDQ  D:  03/03/2002  T:  03/04/2002  Job:  [id number]

## 2010-10-06 NOTE — Assessment & Plan Note (Signed)
[facility]                            CARDIOLOGY OFFICE NOTE   NAME:Patricia [name]                        MRN:          [id number]  DATE:05/24/2006                            DOB:          09-Jun-1926    IDENTIFICATION:  Patricia [name] is a 77 year old woman with the history of  mitral valve disease, status post replacement, resection of valvular  ASO.  I saw her in the clinic actually just a few ago, in December.   The patient called because she said she was feeling a little dizzy and  her blood pressure was high.  She called in.  Her blood pressure was in  the 150-160 range.  She continued to take it.   When talking to the patient now, she has intermittent dizziness, but she  says she thinks she was doing her blood pressure incorrectly and last  week she took her pressure and it has ranged from 106-123 systolic.   She also notes an episode of incontinence while walking to the bathroom  and has not had this before.  Denies dysuria.   CURRENT MEDICATIONS:  1. Altace 10.  2. Cardizem 240.  3. Centrum Silver.  4. Calcium with D.  5. Baby aspirin.  6. Actonel.  7. 10 mg Zocor.   PHYSICAL EXAMINATION:  The patient is in no distress.  Blood pressure  148/85, on my check 110/70, pulse is 83, weight 179.  LUNGS:  Clear.  CARDIAC EXAM:  Regular rate and rhythm, S1, S2, no S3.  Grade 2/6  systolic murmur heard best at the base.  ABDOMEN:  Benign.  EXTREMITIES:  No edema.   IMPRESSION:  1. Hypertension.  Adequate control.  I think she was taking it      incorrectly.  Would continue.  2. Mitral valve disease, status post replacement, again gradient      across the valve is 5.  3. Mild CAD.  4. Dyslipidemia.  Continue on the Zocor.  She can take flax seed oil,      as well as omega 3.  Would follow up with lipids in several weeks      to see how she is doing on 10.  Again, I told her she needs to be      on a statin.   I will set followup for late  spring, sooner if problems develop.   ADDENDUM:  With incontinence times one, we will check a UA.  If  abnormal, she should contact her urologist.     Patricia Riffle, MD, [facility]  Electronically Signed    PVR/MedQ  DD: 05/24/2006  DT: 05/24/2006  Job #: [id number]

## 2010-10-06 NOTE — Assessment & Plan Note (Signed)
[facility]                              CARDIOLOGY OFFICE NOTE   NAME:Patricia [name], Patricia [name]                        MRN:          [id number]  DATE:12/13/2005                            DOB:          Aug 15, 1926    IDENTIFICATION:  Ms. Patricia [name] is a 77 year old woman status post mitral valve  replacement, closure of PFO, resection of valvular aortic stenosis.  She was  last seen in cardiology clinic back at the end of June.  Prior to that she  had just seen Dr. Laneta Simmers.   In the interval she has done fairly well.  She notes no nausea, no  palpitations.  Breathing appears to be okay.  No chest pain, minimal around  the incision.   CURRENT MEDICATIONS:  1.  Altace 5 b.i.d.  2.  Toprol XL 100 daily.  3.  Mucinex 600 b.i.d.  4.  Lipitor 10 daily.  5.  Calcium with D.  6.  Zinc.  7.  Vitamin C.  8.  Multivitamin.  9.  Coumadin as directed.   PHYSICAL EXAMINATION:  GENERAL:  Patient is in no distress.  VITAL SIGNS:  Blood pressure 110/70, pulse 70 and regular, weight 173.  LUNGS:  Clear.  CARDIAC:  Regular rate and rhythm.  Grade 2/6 systolic murmur heard best at  the left sternal border above the apex.  ABDOMEN:  Benign.  EXTREMITIES:  No edema.   IMPRESSION:  1.  Mitral valve disease status post replacement.  Clinically is doing well.      She had some postoperative atrial fibrillation which she sensed has not      had any since.  I would go ahead and stop the Coumadin and continue on a      baby aspirin per day.  2.  Dyslipidemia with setting of mild coronary artery disease.  Will need to      follow.  Check fasting lipids, LFTs.  3.  Hypertension.  Continue on current regimen.   I would like to see the patient back later in the fall, sooner if problems  develop.  Again, aspirin therapy.   ADDENDUM:  Note, echocardiogram on June 25 LV EF 55-65%.  Trivial MR, aortic  sclerosis with minimal stenosis.  Mild TR.                                Pricilla Riffle, MD, [facility]    PVR/MedQ  DD:  12/13/2005  DT:  12/13/2005  Job #:  [id number]

## 2010-10-06 NOTE — H&P (Signed)
NAME:  Patricia [name], Patricia [name]                           ACCOUNT NO.:  [id number]   MEDICAL RECORD NO.:  [id number]                   PATIENT TYPE:  OIB   LOCATION:  2867                                 FACILITY:  [facility]   PHYSICIAN:  Guadelupe Sabin, M.D.             DATE OF BIRTH:  12-Jun-1926   DATE OF ADMISSION:  03/17/2002  DATE OF DISCHARGE:                                HISTORY & PHYSICAL   REASON FOR ADMISSION:  This was a planned outpatient surgical admission of  this 77 year old white female admitted for cataract/implant surgery of the  left eye.   PRESENT ILLNESS:  This patient has been followed in my office since Sep 23, 1972 for routine eye care.  Initial examination at that time revealed acuity  without correction 20/25 -- right eye, 20/30 -- left eye; with correction,  20/20.  By 1191, the patient had been noted to have cataract formation in  both eyes, nuclear type.  Only until recently, however, she has had a  significant problem with her vision with reduction to 20/60 with best  correction in the left eye and 20/30 in the right eye.  The patient  complained of a pressure-like sensation in the left eye, itching and blurred  vision, particularly in the morning.  Glare was also a problem, as well as  difficulty with other visual tasks.  The patient was given oral discussion  and printed information concerning the operative procedure and its  complications.  She signed in informed consent and arrangements made for her  outpatient admission at this time.   PAST MEDICAL HISTORY:  The patient is in stable general health under the  care of her regular physician, Dr. Aram Candela. Tysinger.  She is felt to be in  satisfactory condition for the proposed surgery.   REVIEW OF SYSTEMS:  No cardiorespiratory complaints.   PHYSICAL EXAMINATION:  VITAL SIGNS:  As recorded on admission, blood  pressure 148/81, temperature 96.2, heart rate 62, respirations 16.  GENERAL APPEARANCE:  The  patient is a pleasant, well-nourished, well-  developed 77 year old white female in no acute distress.  HEENT:  Eyes:  Visual acuity as noted above.  Applanation tonometry 14 mm,  each eye.  Slitlamp examination:  The eyes are white and clear with nuclear  cataract formation in both eyes.  Detailed fundus examination dilated  reveals cataract haze, a clear vitreous, attached retina with normal optic  nerve, blood vessels and macula.  CHEST:  Lungs clear to auscultation and percussion.  HEART:  Normal sinus rhythm.  No cardiomegaly.  No murmurs.  ABDOMEN:  Negative.  EXTREMITIES:  Negative.   ADMISSION DIAGNOSIS:  Senile nuclear cataract, left eye.   SURGICAL PLAN:  Cataract/implant surgery, left eye now, right eye later.  Guadelupe Sabin, M.D.    HNJ/MEDQ  D:  03/17/2002  T:  03/17/2002  Job:  [id number]

## 2010-10-06 NOTE — Assessment & Plan Note (Signed)
[facility]                            CARDIOLOGY OFFICE NOTE   NAME:[name], [name]                        MRN:          [id number]  DATE:05/03/2006                            DOB:          30-Aug-1926    IDENTIFICATION:  Patricia [name] is a 77 year old woman with a history of  mitral valve disease status post replacement and also closure of a PFO  resection of subaortic stenosis.  I last saw her back in July.   In the interval, she says she is doing well.  She is doing some walking.  Denies significant shortness of breath.  No chest pressure.  No  dizziness.   Note, she has tried Promise liquid and would like to continue taking  this, question if she can come off of the Zocor.   Also note, the patient has noted some increased hair loss.   CURRENT MEDICATIONS:  1. Altace 5 mg twice daily.  2. Toprol XL 100 daily.  3. Mucinex b.i.d.  4. Calcium with D.  5. Vitamin C daily.  6. Multivitamin daily.  7. Zocor 20 daily.  8. Flaxseed oil.  9. Omega-3.  10.Aspirin 81 mg daily.  11.Actonel 35 mg q. week.   PHYSICAL EXAM:  The patient is in no distress.  Blood pressure 134/76.  Pulse 68.  Weight 179, up from 173 in July.  NECK:  No bruits.  LUNGS:  Clear.  CARDIAC:  Regular rate and rhythm.  Grade 1-2/6 systolic murmur heard  best at the left sternal border, radiating to the apex.  Grade 1/6  diastolic murmur, heard best at the apex.  PMI not displaced.  ABDOMEN:  Benign.  EXTREMITIES:  No edema.   IMPRESSION:  1. Mitral valve disease.  Echocardiogram done this summer showed a      mean gradient of 5 across the aortic valve and mildly decreased      excursion.  There was trivial aortic insufficiency. Left      ventricular outflow trace velocity was not commented on.  Left      ventricular was normal.  I think she would do well to stay on some negative __________ with  mitral valve showing a slight gradient.  I will put her over to Cardizem  240  and tell her to back down the Toprol to 25 daily.  She will call  with her blood pressure response at the end of the next week.  1. Hypertension, see above.  Continue on medicines plus make switches      as noted.  Follow alopecia.  2. Dyslipidemia.  Really, she has excellent numbers.  Her HDL was a      little low.  I thought with her mild coronary artery disease of      adding a low dose Zocor.  She likes Promise which I am not familiar      with nor is Patricia [name].  I told her to cut back on the Zocor to 10      and I would look in to the Promise and be back in touch  with her      again.  Data is for statin.  She has been on a statin for quite      some time, actually Lipitor and Niaspan when followed by Dr. Lucas [name].      She has a small particle type number again.  I think a statin would      be important but I will be in touch with her. Tentatively, set      followup for 6 months.   ADDENDUM:  Note, BNP on recent labs was mildly elevated at 506 but exam  does not suggest volume overload.  Would continue to follow.     Pricilla Riffle, MD, [facility]  Electronically Signed    PVR/MedQ  DD: 05/03/2006  DT: 05/03/2006  Job #: [id number]   cc:   Patricia [name], M.D.

## 2010-10-06 NOTE — Op Note (Signed)
NAME:  Patricia [name], Patricia [name]                 ACCOUNT NO.:  [id number]   MEDICAL RECORD NO.:  [id number]          PATIENT TYPE:  INP   LOCATION:  2313                         FACILITY:  [facility]   PHYSICIAN:  Zenon Mayo, MDDATE OF BIRTH:  19-Dec-1926   DATE OF PROCEDURE:  10/01/2005  DATE OF DISCHARGE:                                 OPERATIVE REPORT   PROCEDURE:  Intraoperative transesophageal echocardiogram.   INDICATION:  Evaluation of valvular disease.   HISTORY:  Patient is a 77 year old female with a history of mitral stenosis  who was brought to the operating room, today, by Dr. Laneta Simmers for repair  versus replacement of her mitral valve.  Intraoperative echocardiogram was  requested to evaluate the mitral valve as well as the aortic valve for  possible repair of that valve as well secondary to increased gradients seen  on prior echocardiogram and catheterization.  The patient was brought to the  operating room and placed under general anesthesia.  After confirmation of  endotracheal tube placement, transesophageal probe was placed in the  patient's esophagus without complication.   The left ventricle was imaged first, and revealed a severely hypertrophied  ventricle.  The wall thickness measured 22 mm.  There were no wall motion  abnormalities.  The ejection fraction was estimated to be 55%.  The mitral  valve was imaged next; and revealed a heavily calcified annulus and valve.  There was very little movement of the valve leaflets.  When color Doppler  was placed across the valve, highly turbulent flow was seen across the valve  as well as a mild to moderate amount of mitral regurgitation.  Velocities  measured across the valve indicate moderate to severe mitral stenosis with  peak gradient of 19.2 mmHg and a mean gradient of 13.4 mmHg.  The aortic  valve was imaged next.  There appeared to be an area of hypertrophied septum  in the left ventricular outflow tract causing a great  amount of turbulence  of flow across the aortic valve; however, when the aortic valve was looked  at in planimetry, the valve itself appeared to be relatively normal in  structure.  There was a small area of calcification noted, but the valve was  trileaflet in nature and the leaflets appeared to move well.  The aortic  valve area was noted to be 2 cm sq; and there was no aortic insufficiency  seen.  The tricuspid valve revealed trace tricuspid regurgitation.  The  pulmonic valve also revealed trace pulmonic regurgitation.  When the  interatrial septum was visualized, a small interatrial septal defect,  probably a patent foramen ovale was seen, with left-to-right flow.  The  thoracic aorta revealed minimal atherosclerotic disease.  The left atrial  appendage was free from thrombus.   At the conclusion of the procedure, the heart was again visualized with  echocardiography.  A replacement of the mitral valve was done; and when  evaluated by echo.  There did not appear to be any mitral regurgitation at  this point.  The new valve leaflets appeared to move freely; and appeared  to  be with to be structurally normal.  The area that appeared to be  hypertrophied, in the left ventricular outflow tract, was difficult to  visualize after bypass secondary to shadowing of the new mitral valve; so,  it was difficult for me to assess any change in the thickness of that area.  There were no changes in aortic valve, its structure, or function.   The patient was weaned from the cardiopulmonary bypass machine wall and did  not require any inotropic support other than a small dose of phenylephrine.  The rest of the heart exam, by echocardiography, was unchanged from  prebypass examination.  At the conclusion of the procedure the echo probe  was removed from the patient's esophagus without evidence of trauma.  The  patient was taken directly from the operating room to the intensive care  unit in stable  condition.           ______________________________  Zenon Mayo, MD     WEF/MEDQ  D:  10/01/2005  T:  10/02/2005  Job:  [id number]

## 2010-10-06 NOTE — Op Note (Signed)
NAME:  Patricia [name], Patricia [name]                 ACCOUNT NO.:  [id number]   MEDICAL RECORD NO.:  [id number]          PATIENT TYPE:  AMB   LOCATION:  ENDO                         FACILITY:  [facility]   PHYSICIAN:  Georgiana Spinner, M.D.    DATE OF BIRTH:  13-May-1927   DATE OF PROCEDURE:  08/10/2004  DATE OF DISCHARGE:                                 OPERATIVE REPORT   PROCEDURE PERFORMED:  Colonoscopy with biopsy and polypectomy.   ENDOSCOPIST:  Georgiana Spinner, M.D.   INDICATIONS FOR PROCEDURE:  Colon polyp. Rectal bleeding.   ANESTHESIA:  Demerol 75 mg. Versed 7.5 mg.   The patient also received ampicillin 2 g and gentamicin 60 mg.   DESCRIPTION OF PROCEDURE:  With the patient mildly sedated in the left  lateral decubitus position, the Olympus videoscopic colonoscope was inserted  in the rectum and passed under direct vision to the cecum, identified by the  ileocecal valve and appendiceal orifice, both of which were photographed.  From this point, the colonoscope was slowly withdrawn taking circumferential  views of the colonic mucosa as we withdrew all the way to the rectum,  stopping only first in the ascending colon just proximal to the hepatic  flexure where a polyp was seen, photographed and removed using snare cautery  technique, setting of 20/200 blended current.  We next stopped just distal  to the hepatic flexure where a second polyp was seen.  It was removed using  hot biopsy forceps technique with the same setting. and a third polyp was  seen just distal to the splenic flexure and it, too was removed with the  same setting and hot biopsy forceps technique.  Diverticula were seen in the  sigmoid colon and hemorrhoids were seen on retroflex view of the anal canal.  The endoscope was straightened and withdrawn.  The patient's vital signs and  pulse oximeter remained stable.  The patient tolerated the procedure well  without apparent complications.   FINDINGS:  Internal hemorrhoids.   Diverticulosis of sigmoid colon.  Polyps  as described above in the descending colon.  In the distal transverse colon  near the splenic flexure. In the proximal transverse colon just distal to  the hepatic flexure and in the ascending colon just proximal to the hepatic  flexure.  The endoscope was straightened and withdrawn.  The patient's vital  signs and pulse oximeter remained stable.  The patient tolerated the  procedure well without apparent complications.   FINDINGS:  Polyp of ascending colon just proximal to the hepatic flexure.  Polyp of proximal transverse colon just distal to the hepatic flexure and  polyp of the distal transverse colon just proximal to the splenic flexure.  Await biopsy report.  The patient will call me for results and follow up  with me as an outpatient.      GMO/MEDQ  D:  08/10/2004  T:  08/10/2004  Job:  [id number]

## 2010-10-06 NOTE — Cardiovascular Report (Signed)
NAME:  Patricia [name], Patricia [name]                 ACCOUNT NO.:  [id number]   MEDICAL RECORD NO.:  [id number]          PATIENT TYPE:  OIB   LOCATION:  1966                         FACILITY:  [facility]   PHYSICIAN:  Pricilla Riffle, M.D.    DATE OF BIRTH:  October 24, 1926   DATE OF PROCEDURE:  08/03/2006  DATE OF DISCHARGE:  08/02/2005                              CARDIAC CATHETERIZATION   CARDIOLOGIST:  Pricilla Riffle, M.D.   PRIMARY CARE PHYSICIAN:  Soyla Murphy. Renne Crigler, M.D.   PATIENT IDENTIFICATION:  Patricia [name] is a delightful 77 year old woman with a  history of known mitral stenosis and aortic stenosis which was moderate by a  recent echocardiogram.  She has been experiencing progressive shortness of  breath on just minimal activity.  She was referred by Dr. Tenny Craw for right and  left heart catheterization in the outpatient catheterization laboratory.   PROCEDURES PERFORMED:  1.  Right heart catheterization with Fick cardiac output.  2.  Left heart catheterization.  3.  Left ventriculogram.  4.  Selective coronary angiography.   DESCRIPTION OF PROCEDURE:  The risks and benefits of catheterization was  explained to Patricia [name].  Consent was signed and placed on the chart. A 4-  [place] arterial sheath was placed in the right femoral artery using a  modified Seldinger technique. Standard catheters including JL-4, 3-D RC and  angled pigtail were used for procedures.  All catheter exchanges were made  over a wire.  There were mo apparent complications. A 7-French venous sheath  was placed in right femoral vein using some a modified Seldinger technique  and standard Swan Ganz catheter was used.  There were no apparent  complications.   FINDINGS:  Right atrial pressure mean of 3, RV pressure 37/1, PA pressure  52/21 with a mean of 37.  Pulmonary capillary wedge pressure mean of 25.  Central aortic pressure 121/62 with a mean of 88.  LV pressure was 138/0  with an EDP of 15.  On simultaneous wedge and LV pressures,  the mitral valve  gradient had a mean of 23 with a mitral valve area calculated 0.7 sq cm.  Aortic valve gradient on pullback was 18 mean with a calculated aortic valve  area of 0.92 cm.  Femoral arterial saturation was 91% on room air.  PA  saturation was checked twice, 67% and 62% on room air.  Fick cardiac output  is 3.6 liters per minute. Fick cardiac index was 1.8 liters per minute per  sq m.  Thermodilution cardiac outputs was 4.4 liters per minute.  Cardiac  index was 2.3 liters per minute per sq m.  Pulmonary vascular resistance was  3.3 Woods units.   Left main was normal.   The LAD was a moderate size vessel that wrapped the apex.  It gave off a  large diagonal in the proximal portion.  There is a 30% stenosis in the LAD  at the takeoff of the diagonal and 30% stenosis in the proximal portion of  the diagonal.   Left circumflex was a moderate-sized system and gave off moderate-sized  ramus,  moderate OM and the distal AV groove circumflex was small and gave  off a large atrial branch. There is no angiographic CAD.  Right coronary  artery was a normal sized dominant vessel and gave off a large PDA, a large  PDA and a small PL.  There was no angiographic coronary artery disease.   Left ventriculogram done in RAO position showed hyperdynamic LV function  with estimated ejection fraction of 75%.  There was mid cavity obliteration.  The mitral annulus was very heavily calcified with 1+ mitral regurgitation.   ASSESSMENT:  1.  Minimal nonobstructive coronary artery disease as described above.  2.  Severe mitral stenosis and moderate aortic stenosis by catheterization.  3.  Hyperdynamic left ventricular function with evidence of mid cavity      obliteration.  4.  Moderate pulmonary hypertension.   PLAN/DISCUSSION:  Her valvular disease seems worse on catheterization than  it did on echocardiogram. Her coronary arteries are essentially normal.  I  will discuss with Dr. Tenny Craw about  a possible referral to CVTS for dual valve  replacement.      Arvilla Meres, M.D. [facility]  Electronically Signed     ______________________________  Pricilla Riffle, M.D.    DB/MEDQ  D:  08/02/2005  T:  08/04/2005  Job:  [id number]   cc:   Soyla Murphy. Renne Crigler, M.D.  Fax: [phone]   Pricilla Riffle, M.D.  [address]  [address]  [place]  [place] [address]

## 2010-10-06 NOTE — Op Note (Signed)
NAME:  Patricia [name], Patricia [name]                 ACCOUNT NO.:  [id number]   MEDICAL RECORD NO.:  [id number]          PATIENT TYPE:  AMB   LOCATION:  ENDO                         FACILITY:  [facility]   PHYSICIAN:  Georgiana Spinner, M.D.    DATE OF BIRTH:  March 22, 1927   DATE OF PROCEDURE:  09/03/2006  DATE OF DISCHARGE:                               OPERATIVE REPORT   PROCEDURE:  Colonoscopy.   INDICATIONS:  Colon polyp.   ANESTHESIA:  Fentanyl 75 mcg, Versed 5 mg.   PROCEDURE:  With the patient mildly sedated in the left lateral  decubitus position, the Pentax videoscopic colonoscope was inserted into  the rectum, passed under direct vision with pressure applied to the  abdomen.  We passed through a very tight thickened diverticula-filled  sigmoid colon until we finally reached the cecum identified by crow's  foot of the cecum and ileocecal valve, both of which were photographed.  From this point the colonoscope was slowly withdrawn, taking  circumferential views of the colonic mucosa, stopping one fold removed  from the ileocecal valve and the ascending colon where a polyp was seen,  photographed and removed using hot biopsy forceps technique, setting of  20/200 blended current.  We next stopped in the rectum which appeared  normal on direct and showed hemorrhoids on retroflexed view.  The  endoscope was straightened, withdrawn.  The patient's vital signs and  pulse oximeter remained stable.  The patient tolerated the procedure  well without apparent complications.   FINDINGS:  Polyp of ascending colon, significant diverticulosis of the  sigmoid colon, and hemorrhoids internally were noted.   PLAN:  Await biopsy report.  The patient will call me for results and  follow up with me as an outpatient.  Of note, the patient also received  ampicillin and gentamicin prior to the procedure for SBE prophylaxis.           ______________________________  Georgiana Spinner, M.D.     GMO/MEDQ  D:  09/03/2006   T:  09/03/2006  Job:  [id number]

## 2010-10-06 NOTE — Op Note (Signed)
NAME:  Patricia [name], Patricia [name]                           ACCOUNT NO.:  [id number]   MEDICAL RECORD NO.:  [id number]                   PATIENT TYPE:  OIB   LOCATION:  2867                                 FACILITY:  [facility]   PHYSICIAN:  Guadelupe Sabin, M.D.             DATE OF BIRTH:  02-Dec-1926   DATE OF PROCEDURE:  03/17/2002  DATE OF DISCHARGE:                                 OPERATIVE REPORT   PREOPERATIVE DIAGNOSIS:  Senile nuclear cataract, left eye.   POSTOPERATIVE DIAGNOSIS:  Senile nuclear cataract, left eye.   OPERATION:  Planned extracapsular cataract extraction --  phacoemulsification, primary insertion of posterior chamber intraocular lens  implant.   SURGEON:  Guadelupe Sabin, M.D.   ASSISTANT:  Nurse.   ANESTHESIA:  Local 4% Xylocaine, 0.75% Marcaine, anesthesia standby  required; patient given sodium Pentothal intravenously during the period of  retrobulbar injection.   DESCRIPTION OF PROCEDURE:  After the patient was prepped and draped, a lid  speculum was inserted in the left eye.  The eye was turned downward and a  superior rectus traction suture placed.  Schiotz tonometry was recorded at 7  scale units with a 5.5 g weight.  A peritomy was performed adjacent to the  limbus from the 11 to 1 o'clock position.  The corneoscleral junction was  cleaned and a corneoscleral groove made with a 45 degree Superblade.  The  anterior chamber was then entered with a 2.5-mm diamond keratome at the 12  o'clock position and a 15 degree blade at the 2:30 position.  Using a bent  26-gauge needle on a Healon syringe, circular capsulorrhexis was begun and  then completed with the Grabow forceps.  Hydrodissection and  hydrodelineation were performed using 1% Xylocaine.  A 30 degree  phacoemulsification tip was then inserted with slow, controlled  emulsification of the lens nucleus; total ultrasonic time -- 1 minute 51  seconds, average power level -- 19%, total amount of fluid used --  80 cc.  Following removal of the nucleus, the residual cortex was aspirated with the  irrigation-aspiration tip.  The posterior capsule appeared intact with a  brilliant red fundus reflex.  It was therefore elected to insert an [employer] [id number] silicone three-piece posterior chamber intraocular  lens implant, diopter strength +22.00.  This was inserted into the anterior  chamber with the McDonald forceps and then centered into the capsular bag  using the [facility] lens rotator and the lens appeared to be well-centered.  The Healon which had been used throughout the procedure was aspirated and  replaced with balanced salt solution and Miochol ophthalmic solution.  The  operative incisions appeared to be self-sealing and no sutures were  required.  A light patch and protective shield were applied to the operated  left eye.  Guadelupe Sabin, M.D.     HNJ/MEDQ  D:  03/17/2002  T:  03/17/2002  Job:  [id number]

## 2010-10-06 NOTE — Discharge Summary (Signed)
Patricia [name], Patricia [name]                 ACCOUNT NO.:  [id number]   MEDICAL RECORD NO.:  [id number]          PATIENT TYPE:  INP   LOCATION:  2035                         FACILITY:  [facility]   PHYSICIAN:  Evelene Croon, M.D.     DATE OF BIRTH:  [birth year]   DATE OF ADMISSION:  10/01/2005  DATE OF DISCHARGE:  10/13/2005                                 DISCHARGE SUMMARY   PRIMARY DIAGNOSES:  1.  Severe mitral stenosis.  2.  Moderate aortic stenosis.  3.  Asymmetric septal hypertrophy.   SECONDARY DIAGNOSES:  1.  Postoperative atrial fibrillation.  2.  Acute blood loss anemia postoperatively.  3.  Volume overload.  4.  Phlebitis left arm.  5.  Hyperlipidemia.  6.  History of multiple breast cysts.   ALLERGIES:  ALLERGIC TO MACROBID.   IN-HOSPITAL OPERATIONS AND PROCEDURES:  1.  Mitral valve replacement using a 25 mm pericardial tissue valve, closure      of left atrial appendage, closure of foramen ovale, resection of aortic      valvular stenosis.  2.  Intraoperative transesophageal echocardiogram.   HISTORY AND PHYSICAL AND HOSPITAL COURSE:  The patient is a 77 year old  female with a known history of mitral stenosis and aortic stenosis which was  documented  to be of moderate severity by recent echocardiogram.  She  underwent cardiac catheterization on August 02, 2005 which showed no  significant coronary disease.  She has had progressive shortness of breath  with minimal activity.  Left ventricular ejection fraction was about 75%  with cavitary obliteration on left ventriculogram.  Mitral annulus was  heavily calcified with mild mitral regurgitation.  Right heart  catheterization showed a right atrial pressure of 3 and a right ventricular  pressure of 37/1 with a PA pressure of 53/21 and a mean of 37.  Pulmonary  capillary wedge pressure was 25.  Gradient across the mitral valve was  recorded at a mean of 23 with mitral valve area of 0.7.  Aortic valve  gradient on pullback showed a  mean calculated aortic valve area of 0.92 sq  cm with a cardiac index of 2.3.  Her most recent echocardiogram was on July 23, 2005 which showed a left ventricular ejection fraction of 60-65% with  markedly increased left ventricular wall thickness.  There is marked focal  basal septal hypertrophy.  At end systole there is a cavitary left  ventricular obliteration.  There is Doppler evidence of left ventricular  outflow tract (__________).  The aortic valve was felt to be mildly  thickened with mildly reduced leaflet excursion and no __________.  The  mitral valve was markedly thickened with mitral annular calcification.  Following echocardiogram, the patient is seen and evaluated by Dr. Laneta Simmers.  Dr. Laneta Simmers discussed with the patient taking her to the operating room to  undergo mitral valve replacement.  He discussed risks and benefits of  procedure.  The patient acknowledged understanding and agrees to proceed.  The patient was scheduled for surgery for Oct 01, 2005.   The patient was taken to the operating room Oct 01, 2005 where she underwent  mitral valve replacement using a 25 mm pericardial tissue __________  valve  with closure of left atrial appendage, closure of patent foramen ovale,  resection of aortic subvalvular stenosis.  The patient tolerated procedure  well and was transferred up to the intensive care unit in stable condition.  Postoperatively the patient did develop thrombocytopenia as well as acute  blood loss.  The patient did receive fresh frozen plasma as well as  platelets initially postoperatively.  The patient stabilized following the  fresh frozen plasma and platelets.  She was extubated late evening/early  morning following surgery.  Following __________, the patient was alert and  oriented x4.  The patient's postoperative course was complicated by  postoperative thrombocytopenia.  Following the initial platelets she  received the patient's platelet count was  monitored.  It slowly started to  trend upward over the next several days.  Prior to discharge home platelet  count increased to within normal limits at 164.  The patient also developed  acute blood loss anemia postoperatively.  Hemoglobin and hematocrit dropped  to 8.8 and 25.7.  The patient was asymptomatic from the blood loss.  The  patient's hemoglobin and hematocrit were monitored.  The patient did not  receive any packed red blood cells postoperatively.  The patient was stable.  Last hemoglobin and hematocrit __________.  On postop day #6 the patient  developed postoperative atrial fibrillation.  Heart rate was in the 140s.  The patient was started on IV amiodarone.  She has been on Coumadin  following surgery for her mitral valve replacement.  Following initiation of  IV amiodarone the patient converted to normal sinus rhythm the following  morning.  The patient was switched from IV amiodarone to p.o. amiodarone.  She remained in normal sinus rhythm following initial __________.  The  patient is to be on the amiodarone p.o.  she seem to be therapeutic prior to  discharge.  The patient also developed a left arm phlebitis most likely from  the infusion of amiodarone.  Warm compresses were applied.  The patient was  started on p.o. Keflex.  Prior to discharge home the areas of edema had  decreased.  She was started on Lasix.  The patient's weight was able to drop  below her preoperative weight prior to discharge.   The patient's chest tube was discontinued in the normal fashion.  She was  out of bed ambulating well with assistance.  She was transferred out of  __________ on postop day #2.  Vital signs were monitored during her  postoperative course and found to be stable.  Medications were adjusted  appropriately.  The patient was able to be weaned off oxygen saturating  greater  than 90% on room air.  She was afebrile during her postoperative course.  Incisions were dry and intact and  healing well.  The patient was  tolerating regular diet well and denies any vomiting.  Bowel movements  within normal limits.  Again the patient was started on Coumadin for her  mitral valve replacement.  Again the patient was therapeutic prior to  discharge home.  Again the patient's pulmonary status was stable and she was  encouraged to use her incentive spirometer.   The patient is tentatively ready for discharge home over the next 1-2 days.  Follow-up appointment was scheduled with Dr. Laneta Simmers for November 12, 2005 at  2:30 p.m.  The patient is to follow up with Dr. Tenny Craw Oct 18, 2005  at 9:30  a.m.  The patient will need to obtain a PT and INR 2 days following  discharge from hospital at [facility].  Ms. Joos received  instructions on diet and incisional care.  She was told no driving until  released to do so __________.  The patient is told she is allowed to shower  and wash her incisions using soap and water.  She is to contact the office  if she develops any drainage or opening from any of her incision sites.  The  patient is told to ambulate 3-4 times a day, progress as tolerated and  continue her breathing exercises.  The patient was educated on diet to be  low fat, low salt. __________.   DISCHARGE MEDICATIONS:  1.  Amiodarone 400 mg b.i.d. x7 days then 400 mg b.i.d.  2.  Lopressor 50 mg b.i.d.  3.  Altace 5 mg b.i.d.  4.  Lipitor 10 mg q.h.s.  5.  Mucinex 600 mg two tablets b.i.d.  6.  Coumadin will be dosed prior to the patient's discharge PT/INR.  7.  Keflex 550 mg t.i.d. x6 days.  8.  Multivitamin daily.  9.  Calcium daily.  10. Claritin as used at home.  11. Vitamin C daily.  12. Tylox one to two tablets q.4-6 h. p.r.n. pain.      Theda Belfast, [place]      Evelene Croon, M.D.  Electronically Signed    KMD/MEDQ  D:  10/12/2005  T:  10/13/2005  Job:  [id number]   cc:   Dr. __________ Tenny Craw

## 2010-10-06 NOTE — Op Note (Signed)
NAMEFLORENTINE, Patricia [name]                 ACCOUNT NO.:  [id number]   MEDICAL RECORD NO.:  [id number]          PATIENT TYPE:  INP   LOCATION:  2313                         FACILITY:  [facility]   PHYSICIAN:  Evelene Croon, M.D.     DATE OF BIRTH:  10-31-1926   DATE OF PROCEDURE:  10/01/2005  DATE OF DISCHARGE:                                 OPERATIVE REPORT   PREOPERATIVE DIAGNOSES:  Severe mitral stenosis, moderate aortic stenosis,  asymmetric septal hypertrophy.   POSTOPERATIVE DIAGNOSES:   OPERATION PERFORMED:  Median sternotomy, extracorporeal circulation, mitral  valve replacement using a 25 mm pericardial tissue heart valve, closure of  left atrial appendage, closure of patent foramen ovale, resection of aortic  subvalvular stenosis.   SURGEON:  Alleen Borne, M.D.   ASSISTANT:  Salvatore Decent. Cornelius Moras, M.D.   ANESTHESIA:  General endotracheal.   CLINICAL HISTORY:  The patient is a 77 year old woman with a known history  of mitral stenosis and aortic stenosis which was documented to be of  moderate severity by recent echocardiogram.  She underwent cardiac  catheterization on August 02, 2005 which showed no significant coronary  disease.  She has had progressive shortness of breath with minimal activity.  The left ventricular ejection fraction was about 75% with midcavitary  obliteration on left ventriculogram.  The mitral annulus was heavily  calcified with mild mitral regurgitation.  Right heart catheterization  showed a right atrial pressure of 3 and a right ventricular pressure of 37/1  with a PA pressure of 53/21 and a mean of 37.  Pulmonary capillary wedge  pressure was 25.  The gradient across the mitral valve was recorded at a  mean of 23 with a mitral valve area of 0.7 cm squared.  Aortic valve  gradient on pullback showed a mean of 18 with a calculated aortic valve area  of 0.92 cm squared with a cardiac index of 2.3.  Her most recent  echocardiogram as of July 23, 2005 showed a  left ventricular ejection  fraction of 60 to 65% with markedly increased left ventricular wall  thickness.  There was marked focal basal septal hypertrophy.  At the end of  systole, there was midcavitary left ventricular obliteration.  There was  Doppler evidence of dynamic left ventricular outflow tract obstruction  during Valsalva maneuver with a peak gradient  of 52 mmHg.  The aortic valve  was felt to be mildly thickened with mildly reduced leaflet excursion and no  aortic insufficiency.  There was mild ascending aortic dilatation.  The  mitral valve was markedly thickened with mitral annular calcification that  was severe.  Doppler findings were consistent with moderate mitral stenosis  and mild mitral regurgitation.  There was mild tricuspid regurgitation.  After I saw the patient in the office and reviewed these tests, I felt that  we should proceed with mitral valve replacement as quickly as possible  because I thought that the findings were consistent with severe mitral  stenosis with pulmonary hypertension.  I discussed the operative procedure  of mitral valve replacement and possible aortic valve replacement  including  resection of asymmetric basal septal hypertrophy.  I discussed alternatives,  benefits and risks including bleeding, blood transfusion, infection, stroke,  myocardial infarction and death.  She understood and agreed to proceed.   DESCRIPTION OF PROCEDURE:  The patient was taken to the operating room and  placed on the table in supine position.  After induction of general  endotracheal anesthesia, a Foley catheter was placed in the bladder using  sterile technique.  Then the chest, abdomen and both lower extremities were  prepped and draped in the usual sterile manner.  Transesophageal  echocardiogram showed severe rheumatic mitral stenosis with markedly  calcified mitral valve with minimal leaflet movement.  There was a small  central opening.  There was marked  mitral annular calcification.  There was  moderate to severe left ventricular hypertrophy that was concentric.  There  appeared to be some basal septal hypertrophy present.  The aortic valve  leaflets appeared thin and pliable with good leaflet motion.  Most of the  turbulence appeared to be in the subvalvular area.  Right ventricular  function appeared well preserved.   Then the chest was opened through a median sternotomy incision and the  pericardium opened in the midline.  Examination of the ascending aorta  showed that it was slightly larger than normal for her expected size.  I  would not say that it was aneurysmal.  There were no palpable plaques in it.   Then the patient was heparinized and when an adequate activated clotting  time was achieved, the distal ascending aorta was cannulated using a 20  French aortic cannula for arterial inflow.  Venous outflow was achieved  using bicaval venous cannulation with a 24 French metal tip right angle  cannula placed directly into the superior vena cava through a pursestring  suture.  A 36 French plastic right angle cannula was placed through a  pursestring suture in the low right atrium and advanced into the inferior  vena cava.  An antegrade cardioplegia and vent cannula was inserted in the  aortic root.  A retrograde cardioplegia cannula was inserted through the  right atrium into the coronary sinus.   Then the patient was placed on cardiopulmonary bypass.  Then the aorta was  crossclamped and 600 mL of cold blood antegrade cardioplegia was  administered in the aortic root with quick arrest of the heart.  Systemic  hypothermia to 28 degrees centigrade and topical hypothermia with iced  saline was used.  A temperature probe was placed on the septum and  insulating pad in the pericardium.  Additional doses of cardioplegia were  given in a retrograde manner at approximately 20 minute intervals to maintain myocardial temperature around 10  degrees centigrade.   Then the left atrium was opened through a vertical incision in the  interatrial groove.  The mitral retractor was placed.  Examination of the  mitral valve showed that there was severe rheumatic mitral stenosis with  markedly calcified mitral leaflets and mitral annulus.  There was complete  fusion of the commissures.  The calcium in the annulus was circumferential  and extended down into the left ventricular wall.  The papillary muscles and  subvalvular apparatus  was completely fused and calcified.  This certainly  was not suitable for repair.  Then the mitral valve was excised.  It was  necessary to debride a large amount of this calcium within the valve and the  annulus in order to allow the annulus adequate expansion to place  a  prosthetic valve.  This took a considerable amount of time due to the  density of the calcium.  Care was taken to remove all particulate debris.  After adequate decalcification of the mitral annulus.  The annulus was sized  and a 25 mm pericardial valve was chosen.   Then before placement of the mitral valve, the aorta was opened transversely  about 1 cm above the take off of the right coronary ostium.  Examination of  the aortic valve showed that the three leaflets were soft and pliable with  good leaflet mobility and no evidence of aortic valve stenosis.  There did  appear to be subvalvular stenosis with a large papillary muscle band which  was obstructing the left ventricular outflow tract.  This appeared to be a  muscle band from the anterolateral papillary muscle which had become  shortened and very thickened and hypertrophied and essentially fused up to  the anterior mitral annulus.  There was no chorda in this area.  This muscle  band was about the size of my index finger and was completely excised.  After excision of this, there appeared to be no significant obstruction to  outflow of the left ventricle.  The basal septum  appeared thicker than  normal, but did not appear to be the typical thickness that we see with  asymmetric septal hypertrophy.  I did not feel that any further resection of  the septum was indicated at this time.   Then attention was returned to mitral valve replacement.  A series of  pledgeted 2-0 Ethibond horizontal mattress sutures were placed with the  pledgets in the subannular position.  These sutures were then placed through  the sewing ring and the valve lowered in place.  The sutures were tied  sequentially.  The valve appeared to seat nicely.  Then the left atrium was  closed in two layers using continuous 3-0 Prolene suture.   Then the aorta was closed in two layers using continuous 4-0 Prolene suture  with some Bioglue used to reinforce the closure.  Then attention was turned  to the small patent foramen ovale which was found by TEE.  The superior and  inferior vena cavae were encircled with tapes.  Then the right atrium was opened through an oblique incision in the area of the right atrial  appendage.  Examination of the fossa ovale showed a small patent ovale which  was measured a few millimeters in diameter.  This was closed with a single  pledgeted 3-0 Prolene suture.  Then the right atrium was closed with  continuous 4-0 Prolene suture in two layers.   Then the left side of the heart was deaired.  The head was placed in  Trendelenburg position and the crossclamp was removed with time of 159  minutes.  There was spontaneous return of sinus rhythm.   As the patient was rewarming to 37 degrees centigrade, we filled the heart  to examine the mitral valve.  Transesophageal echocardiogram was performed  by anesthesiology.  Surprisingly, this showed a large jet of perivalvular  regurgitation in the area of the anterior mitral annulus.  This was at  approximately the 1 to 2 o'clock position.  Therefore, it was necessary to  replace the cross-clamp and open the left atrium  again to examine the valve.  This was performed and the patient's temperature allowed to drift downwards.  With the left atrium opened, I examined the prosthetic valve.  There did  appear  to be an area of regurgitation anteriorly at approximately the 1 to 2  o'clock position where it appeared that one of the sutures had pulled  through the anterior mitral annulus.  This area was fairly thin and  calcified which made it more friable.  This area was fixed using three  pledgeted 2-0 Ethibond horizontal mattress sutures to close the defect.  I  felt that this was a good closure.  Then the left atrium was again closed  with two layers of continuous 3-0 Prolene suture.  The left side of the  heart was deaired.  The head was placed in Trendelenburg position and the  cross-clamp removed with an additional 44 minutes of cross-clamp time.  There was spontaneous return of sinus rhythm.  Then two temporary right  atrial and right ventricular pacing wires were placed and brought out  through the skin.  When the patient had rewarmed to 37 degrees centigrade,  she was weaned from cardiopulmonary bypass on no inotropic agents.  Total  bypass time was 242 minutes.  Transesophageal echocardiogram showed that the  prosthetic valve was functioning normally with no evidence of perivalvular  leak or regurgitation.  Left ventricular function appeared well preserved  with  moderate to severe concentric left ventricular hypertrophy.  Right  ventricular function appeared well preserved.  The aortic valve was  functioning normally with no aortic insufficiency.  There did not appear to  be left ventricular outflow tract obstruction.  Protamine was given and the  venous and aortic cannulas were removed without difficulty.  The patient was  somewhat coagulopathic and was given 10 units of platelets due to a platelet  count of 60,000 as well as protamine.  Adequate hemostasis was achieved. Three chest tubes were placed  with a tube in the posterior pericardium, one  in the left pleural space and one in the anterior mediastinum.  The  pericardium could not be closed over the heart.  The sternum was closed with  #6 stainless steel wires.  The fascia was closed with continuous #1 Vicryl  suture.  The subcutaneous tissue was closed with continuous 2-0 Vicryl and  the skin with 3-0 Vicryl subcuticular closure.  Sponge, needle and  instrument  counts were correct according to the scrub nurse.  Dry sterile dressings  were applied over the incisions, around the chest tubes which were hooked to  Pleur-evac suction.  The patient remained hemodynamically stable and was  transported to the SICU in guarded but stable condition.      Evelene Croon, M.D.  Electronically Signed     BB/MEDQ  D:  10/01/2005  T:  10/02/2005  Job:  [id number]   cc:   Pricilla Riffle, M.D.  [address]  [address]  [place]  [place] [address]   Cardiac cath lab

## 2010-10-10 ENCOUNTER — Encounter: Payer: Self-pay | Admitting: Internal Medicine

## 2010-10-13 ENCOUNTER — Ambulatory Visit (INDEPENDENT_AMBULATORY_CARE_PROVIDER_SITE_OTHER): Payer: BC Managed Care – HMO | Admitting: Internal Medicine

## 2010-10-13 ENCOUNTER — Encounter: Payer: Self-pay | Admitting: Internal Medicine

## 2010-10-13 DIAGNOSIS — I421 Obstructive hypertrophic cardiomyopathy: Secondary | ICD-10-CM

## 2010-10-13 DIAGNOSIS — I10 Essential (primary) hypertension: Secondary | ICD-10-CM

## 2010-10-13 DIAGNOSIS — I4891 Unspecified atrial fibrillation: Secondary | ICD-10-CM

## 2010-10-13 DIAGNOSIS — Z8679 Personal history of other diseases of the circulatory system: Secondary | ICD-10-CM

## 2010-10-13 MED ORDER — CLONIDINE HCL 0.1 MG PO TABS: 0.1000 mg | ORAL_TABLET | Freq: Two times a day (BID) | ORAL | Status: DC

## 2010-10-13 NOTE — Progress Notes (Addendum)
HPIPatient is a 77 year old with a history of HOCM, very mild CAD (cath 2008, 30% narrowings) hypertension, PAF, dyslipidemia.  I saw her in clinic earlier this year. Since seen she denies palpitations.  She says her reathing is OK.  NO dizziness. BP at home is up/down.  120s to 150s.  Home BP cuff actually has read into the 170 range.     Allergies  Allergen Reactions  . Nitrofurantoin     Current Outpatient Prescriptions  Medication Sig Dispense Refill  . amiodarone (PACERONE) 200 MG tablet Take 100 mg by mouth daily.        [name] amoxicillin (AMOXIL) 500 MG capsule Take 500 mg by mouth 3 (three) times daily.        [name] aspirin 81 MG tablet Take 81 mg by mouth daily.        . Calcium 600-200 MG-UNIT per tablet Take 2 tablets by mouth daily.        . cholecalciferol (VITAMIN D) 1000 UNITS tablet Take 1,000 Units by mouth daily.        . fish oil-omega-3 fatty acids 1000 MG capsule Take 2 g by mouth daily.        . lansoprazole (PREVACID) 30 MG capsule Take 30 mg by mouth as needed.        [name] levothyroxine (SYNTHROID, LEVOTHROID) 25 MCG tablet Take 25 mcg by mouth daily.        [name] losartan (COZAAR) 100 MG tablet Take 100 mg by mouth daily.        . Multiple Vitamin (MULTIVITAMIN) tablet Take 1 tablet by mouth daily.        . rosuvastatin (CRESTOR) 5 MG tablet Take 1.25 mg by mouth daily.        . vitamin C (ASCORBIC ACID) 500 MG tablet Take 500 mg by mouth 2 (two) times daily.        [name] warfarin (COUMADIN) 5 MG tablet Take 5 mg by mouth as directed.        [name] DISCONTD: amLODipine (NORVASC) 5 MG tablet Take 5 mg by mouth daily.        [name] DISCONTD: sertraline (ZOLOFT) 50 MG tablet Take 50 mg by mouth daily.          Past Medical History  Diagnosis Date  . Atrial fibrillation   . CAD (coronary artery disease)   . Mitral valve disorder   . HTN (hypertension)   . Dyslipidemia   . Aortic stenosis, mild   . Breast cyst   . Hypertrophic cardiomyopathy     Past Surgical History  Procedure  Date  . Laparoscopic cholecystectomy 2009  . Cataract extraction 2003    left  . Mitral valve replacement 2007  . Neck mass excision 2003    No family history on file.  History   Social History  . Marital Status: Married    Spouse Name: N/A    Number of Children: N/A  . Years of Education: N/A   Occupational History  . Not on file.   Social History Main Topics  . Smoking status: Former Smoker    Quit date: 05/21/1990  . Smokeless tobacco: Not on file  . Alcohol Use: No  . Drug Use: Not on file  . Sexually Active: Not on file   Other Topics Concern  . Not on file   Social History Narrative  . No narrative on file    Review of Systems:  All systems reviewed.  They are  negative to the above problem except as previously stated.  Vital Signs: BP 117/70  Pulse 63  Resp 16  Ht 5\' 4"  (1.626 m)  Wt 189 lb (85.73 kg)  BMI 32.44 kg/m2  Physical Exam Patient is in NAD BP on my check is 150/.  Her cuff reads 25 pts higher.  HEENT:  Normocephalic, atraumatic. EOMI, PERRLA.  Neck: JVP is normal. No thyromegaly. No bruits.  Lungs: clear to auscultation. No rales no wheezes.  Heart: Regular rate and rhythm. Normal S1, S2. No S3.   Gr II/VI systolic murmur.[name] PMI not displaced.  Abdomen:  Supple, nontender. Normal bowel sounds. No masses. No hepatomegaly.  Extremities:   Good distal pulses throughout. No lower extremity edema.  Musculoskeletal :moving all extremities.  Neuro:   alert and oriented x3.  CN II-XII grossly intact.  EKG:  Sinus rhythm.   63 bpm.  First degree AV block.  LVH with repol abnormality.  ANteroseptal MI.   Assessment and Plan:

## 2010-10-13 NOTE — Patient Instructions (Signed)
New medication Clonidine 0.1mg   1 pill 2 times per day  Your physician wants you to follow-up in:6 months You will receive a reminder letter in the mail two months in advance. If you don't receive a letter, please call our office to schedule the follow-up appointment.

## 2010-10-17 NOTE — Assessment & Plan Note (Signed)
Doing well.  Will get periodic echoes to follow.

## 2010-10-17 NOTE — Assessment & Plan Note (Signed)
Remains in SR.  Continue meds.

## 2010-10-17 NOTE — Assessment & Plan Note (Signed)
Not optimally controlled.  I would add low dose clonidine (0.1 bid).  Patient to record values and send in.

## 2010-10-27 ENCOUNTER — Encounter: Payer: Self-pay | Admitting: Internal Medicine

## 2010-10-27 ENCOUNTER — Telehealth: Payer: Self-pay | Admitting: Internal Medicine

## 2010-10-27 ENCOUNTER — Ambulatory Visit (INDEPENDENT_AMBULATORY_CARE_PROVIDER_SITE_OTHER): Payer: Medicare Other | Admitting: Internal Medicine

## 2010-10-27 DIAGNOSIS — R0609 Other forms of dyspnea: Secondary | ICD-10-CM

## 2010-10-27 DIAGNOSIS — I421 Obstructive hypertrophic cardiomyopathy: Secondary | ICD-10-CM

## 2010-10-27 DIAGNOSIS — Z8679 Personal history of other diseases of the circulatory system: Secondary | ICD-10-CM

## 2010-10-27 DIAGNOSIS — I2510 Atherosclerotic heart disease of native coronary artery without angina pectoris: Secondary | ICD-10-CM

## 2010-10-27 DIAGNOSIS — R0600 Dyspnea, unspecified: Secondary | ICD-10-CM

## 2010-10-27 DIAGNOSIS — I10 Essential (primary) hypertension: Secondary | ICD-10-CM

## 2010-10-27 LAB — BASIC METABOLIC PANEL
CO2: 31 mEq/L (ref 19–32)
Chloride: 108 mEq/L (ref 96–112)
Creatinine, Ser: 0.8 mg/dL (ref 0.4–1.2)
Potassium: 3.7 mEq/L (ref 3.5–5.1)
Sodium: 143 mEq/L (ref 135–145)

## 2010-10-27 LAB — TSH: TSH: 2.35 u[IU]/mL (ref 0.35–5.50)

## 2010-10-27 LAB — BRAIN NATRIURETIC PEPTIDE: Pro B Natriuretic peptide (BNP): 797 pg/mL — ABNORMAL HIGH (ref 0.0–100.0)

## 2010-10-27 NOTE — Assessment & Plan Note (Signed)
Will need to follow off clonidine.

## 2010-10-27 NOTE — Telephone Encounter (Signed)
The new medication pt was put on called clondine makes her very SOB and she wants to discuss this with someone  If no answer at home pls call cell [phone]

## 2010-10-27 NOTE — Progress Notes (Signed)
HPIHPIPatient is a 77 year old with a history of HOCM, very mild CAD (cath 2008, 30% narrowings) hypertension, PAF, dyslipidemia. I saw her in clinic just 2 wks ago.  I added 0.1 Clonidine Bid to her regimen. She said since seen she has developed progressive SOB with activity.  Fine at rest.  No PND.  Weight has been stable.  No worsening edema in legs, they are actually getting better.  No palpitations.  NO dizziness.   Allergies  Allergen Reactions  . Nitrofurantoin     Current Outpatient Prescriptions  Medication Sig Dispense Refill  . amiodarone (PACERONE) 200 MG tablet Take 100 mg by mouth daily.        [name] aspirin 81 MG tablet Take 81 mg by mouth daily.        . Calcium 600-200 MG-UNIT per tablet Take 2 tablets by mouth daily.        . cholecalciferol (VITAMIN D) 1000 UNITS tablet Take 1,000 Units by mouth daily.        . cloNIDine (CATAPRES) 0.1 MG tablet Take 1 tablet (0.1 mg total) by mouth 2 (two) times daily.  60 tablet  11  . fish oil-omega-3 fatty acids 1000 MG capsule Take 2 g by mouth daily.        . lansoprazole (PREVACID) 30 MG capsule Take 30 mg by mouth as needed.        [name] levothyroxine (SYNTHROID, LEVOTHROID) 25 MCG tablet Take 25 mcg by mouth daily.        [name] losartan (COZAAR) 100 MG tablet Take 100 mg by mouth daily.        . Multiple Vitamin (MULTIVITAMIN) tablet Take 1 tablet by mouth daily.        . rosuvastatin (CRESTOR) 5 MG tablet Take 1.25 mg by mouth daily.        . vitamin C (ASCORBIC ACID) 500 MG tablet Take 500 mg by mouth 2 (two) times daily.        [name] warfarin (COUMADIN) 5 MG tablet Take 5 mg by mouth as directed.        [name] DISCONTD: amoxicillin (AMOXIL) 500 MG capsule Take 500 mg by mouth 3 (three) times daily.          Past Medical History  Diagnosis Date  . Atrial fibrillation   . CAD (coronary artery disease)   . Mitral valve disorder   . HTN (hypertension)   . Dyslipidemia   . Aortic stenosis, mild   . Breast cyst   . Hypertrophic cardiomyopathy      Past Surgical History  Procedure Date  . Laparoscopic cholecystectomy 2009  . Cataract extraction 2003    left  . Mitral valve replacement 2007  . Neck mass excision 2003    No family history on file.  History   Social History  . Marital Status: Married    Spouse Name: N/A    Number of Children: N/A  . Years of Education: N/A   Occupational History  . Not on file.   Social History Main Topics  . Smoking status: Former Smoker    Quit date: 05/21/1990  . Smokeless tobacco: Not on file  . Alcohol Use: No  . Drug Use: Not on file  . Sexually Active: Not on file   Other Topics Concern  . Not on file   Social History Narrative  . No narrative on file    Review of Systems:  All systems reviewed.  They are negative to the  above problem except as previously stated.  Vital Signs: BP 118/66  Pulse 70  Ht 5\' 4"  (1.626 m)  Wt 194 lb (87.998 kg)  BMI 33.30 kg/m2  Physical Exam Patient is in NAD  HEENT:  Normocephalic, atraumatic. EOMI, PERRLA.  Neck: JVP is normal. No thyromegaly. No bruits.  Lungs: clear to auscultation. No rales no wheezes.  Heart: Regular rate and rhythm. Normal S1, S2. No S3.   No significant murmurs. PMI not displaced.  Abdomen:  Supple, nontender. Normal bowel sounds. No masses. No hepatomegaly.  Extremities:   Good distal pulses throughout. 1+  lower extremity edema. Chronic stasis changes. Musculoskeletal :moving all extremities.  Neuro:   alert and oriented x3.  CN II-XII grossly intact.  EKG:  Sinus bradycardia.  41 bpm.     Assessment and Plan:

## 2010-10-27 NOTE — Telephone Encounter (Signed)
Patient states she started taken Clonidine 0.1 mg. On 10/13/10. She said this medication is making her SOB. She walked to the mail box yesterday and was sob  on the way back. Last night got up to the restroom  She could hardly made it coming back. I went over the side effect on medication listed in the PDR. But pt said her medication is making SOB and dry mouth which is listed as side effect. Patient has an appointment with Dr. Tenny Craw at 11:30 AM today. Patient aware.

## 2010-10-27 NOTE — Assessment & Plan Note (Signed)
Hold AMio until Monday.

## 2010-10-27 NOTE — Assessment & Plan Note (Signed)
I don't think the episodes of exertional dyspnea are angin.  Most likely bradycardia.  Follow.

## 2010-10-27 NOTE — Patient Instructions (Signed)
Your physician wants you to follow-up in: November 2012 with Dr. Tenny Craw.  You will receive a reminder letter in the mail two months in advance. If you don't receive a letter, please call our office to schedule the follow-up appointment  Your physician has recommended you make the following change in your medication: Stop Clonidine.  Hold Amiodarone until Monday and then restart.

## 2010-10-27 NOTE — Assessment & Plan Note (Signed)
Patient with dyspnea since I saw her in clnic.  Only change was the addition of low dose clonidine for HTN.  ON exam today fluid status looks good. EKG with profound bradycardia.  She is not dizziness.  NO CHF.  No CP. Recomm:  Stop Clonidine.  Hold Amio until Monday.  If she has any dizziness or CP she should go the the ER. Her granddaughter Jamesetta Orleans works in Engineer, manufacturing.  I have discussed with her as well.   Call if problems. Less likely is progression of CAD.

## 2010-10-30 NOTE — Assessment & Plan Note (Signed)
Mild CAD.  I do not think current symptoms are due to ischemia.  FOllow.

## 2010-10-30 NOTE — Assessment & Plan Note (Signed)
Follow.  I am not convinced dyspnea is due to change in hemodynamics of LV>

## 2010-11-10 ENCOUNTER — Telehealth: Payer: Self-pay | Admitting: *Deleted

## 2010-11-10 NOTE — Telephone Encounter (Signed)
Called patient to inform her that we received her lab results from Dr.Pharr. Lipid panel excellent. No changes per Dr.Ross. Patient aware.

## 2010-11-23 ENCOUNTER — Encounter: Payer: Self-pay | Admitting: Internal Medicine

## 2011-01-29 ENCOUNTER — Telehealth: Payer: Self-pay | Admitting: *Deleted

## 2011-01-29 NOTE — Telephone Encounter (Signed)
A voice mail was left on the triage voice mail from Dr.Carpenter who is a dentist. He would like to do some oral surgery on this patient and is requesting guidance from Dr.Ross concerning premeds and holding Coumadin. Will foward message to Dr.Ross.

## 2011-02-03 NOTE — Telephone Encounter (Signed)
OK to hold coumadin prior.  Resume after. No other premeds needed. Watch fluids.

## 2011-02-06 NOTE — Telephone Encounter (Signed)
Dr. Warren Danes aware of recommendations.

## 2011-02-14 LAB — CBC
HCT: 44
Hemoglobin: 14.7
RBC: 4.93
WBC: 7.6

## 2011-02-14 LAB — DIFFERENTIAL
Basophils Absolute: 0.1
Basophils Relative: 1
Eosinophils Absolute: 0.2
Monocytes Relative: 8
Neutro Abs: 5.5
Neutrophils Relative %: 73

## 2011-02-14 LAB — COMPREHENSIVE METABOLIC PANEL
ALT: 24
Alkaline Phosphatase: 75
BUN: 10
CO2: 27
Chloride: 103
Glucose, Bld: 83
Potassium: 3.7
Sodium: 138
Total Bilirubin: 1.4 — ABNORMAL HIGH
Total Protein: 6.6

## 2011-02-27 ENCOUNTER — Telehealth: Payer: Self-pay | Admitting: Internal Medicine

## 2011-02-27 NOTE — Telephone Encounter (Signed)
All Cardiac faxed to [facility] @ [phone]   02/27/11/km

## 2011-03-01 ENCOUNTER — Telehealth: Payer: Self-pay | Admitting: Internal Medicine

## 2011-03-01 DIAGNOSIS — I10 Essential (primary) hypertension: Secondary | ICD-10-CM

## 2011-03-01 MED ORDER — LOSARTAN POTASSIUM 100 MG PO TABS: 100.0000 mg | ORAL_TABLET | Freq: Every day | ORAL | Status: DC

## 2011-03-01 NOTE — Telephone Encounter (Signed)
Pt called . She needs refill of [facility]artan [facility] [address]

## 2011-03-01 NOTE — Telephone Encounter (Signed)
Refill sent to pharmacy.   

## 2011-04-13 ENCOUNTER — Ambulatory Visit: Payer: Medicare Other | Admitting: Internal Medicine

## 2011-04-16 ENCOUNTER — Ambulatory Visit (INDEPENDENT_AMBULATORY_CARE_PROVIDER_SITE_OTHER): Payer: Medicare Other | Admitting: Internal Medicine

## 2011-04-16 ENCOUNTER — Encounter: Payer: Self-pay | Admitting: Internal Medicine

## 2011-04-16 VITALS — BP 118/62 | HR 63 | Ht 64.0 in | Wt 190.4 lb

## 2011-04-16 DIAGNOSIS — I10 Essential (primary) hypertension: Secondary | ICD-10-CM

## 2011-04-16 DIAGNOSIS — R0602 Shortness of breath: Secondary | ICD-10-CM

## 2011-04-16 DIAGNOSIS — R0600 Dyspnea, unspecified: Secondary | ICD-10-CM

## 2011-04-16 DIAGNOSIS — Z8679 Personal history of other diseases of the circulatory system: Secondary | ICD-10-CM

## 2011-04-16 DIAGNOSIS — R0609 Other forms of dyspnea: Secondary | ICD-10-CM

## 2011-04-16 LAB — CBC WITH DIFFERENTIAL/PLATELET
Basophils Absolute: 0.1 10*3/uL (ref 0.0–0.1)
Eosinophils Absolute: 0.2 10*3/uL (ref 0.0–0.7)
Hemoglobin: 12.7 g/dL (ref 12.0–15.0)
Lymphocytes Relative: 11.8 % — ABNORMAL LOW (ref 12.0–46.0)
MCHC: 32.8 g/dL (ref 30.0–36.0)
Monocytes Relative: 7.1 % (ref 3.0–12.0)
Neutrophils Relative %: 77 % (ref 43.0–77.0)
RBC: 4.26 Mil/uL (ref 3.87–5.11)
RDW: 15.7 % — ABNORMAL HIGH (ref 11.5–14.6)

## 2011-04-16 LAB — BASIC METABOLIC PANEL
CO2: 26 mEq/L (ref 19–32)
Chloride: 110 mEq/L (ref 96–112)
Potassium: 3.8 mEq/L (ref 3.5–5.1)

## 2011-04-16 LAB — BRAIN NATRIURETIC PEPTIDE: Pro B Natriuretic peptide (BNP): 588 pg/mL — ABNORMAL HIGH (ref 0.0–100.0)

## 2011-04-16 NOTE — Patient Instructions (Signed)
Your physician has requested that you have an echocardiogram. Echocardiography is a painless test that uses sound waves to create images of your heart. It provides your doctor with information about the size and shape of your heart and how well your heart's chambers and valves are working. This procedure takes approximately one hour. There are no restrictions for this procedure.  Lab work today We will call you with results. 

## 2011-04-16 NOTE — Progress Notes (Addendum)
Patient ID: Patricia [name], female   DOB: 1927/03/10, 77 y.o.   MRN: [id number] HPI Patient is a 77 year old with a history of HOCM, very mild CAD (cath 2008, 30% narrowings) hypertension, PAF, dyslipidemia. I saw her in clinic just 2 wks ago. I added 0.1 Clonidine Bid to her regimen.  She said since seen she has developed progressive SOB with activity. Fine at rest. No PND. Weight has been stable. No worsening edema in legs, they are actually getting better. No palpitations. NO dizziness.  When I saw her she was profoundly SOB> EKG with profound breadycardia.  Mediciens adjusted. SInce seen the change breathing got better then it got worse.  No CP.  No PND.  No dizziness.     Allergies  Allergen Reactions  . Nitrofurantoin     Current Outpatient Prescriptions  Medication Sig Dispense Refill  . amiodarone (PACERONE) 200 MG tablet Take 100 mg by mouth daily.        . Calcium 600-200 MG-UNIT per tablet Take 2 tablets by mouth daily.        . cholecalciferol (VITAMIN D) 1000 UNITS tablet Take 1,000 Units by mouth daily.        . fish oil-omega-3 fatty acids 1000 MG capsule Take 2 g by mouth daily.        . lansoprazole (PREVACID) 30 MG capsule Take 30 mg by mouth as needed.        [name] losartan (COZAAR) 100 MG tablet Take 1 tablet (100 mg total) by mouth daily.  30 tablet  6  . Multiple Vitamin (MULTIVITAMIN) tablet Take 1 tablet by mouth daily.        . rosuvastatin (CRESTOR) 5 MG tablet Take 1.25 mg by mouth daily.        . sertraline (ZOLOFT) 25 MG tablet Take 25 mg by mouth daily.        . vitamin C (ASCORBIC ACID) 500 MG tablet Take 500 mg by mouth 2 (two) times daily.        [name] warfarin (COUMADIN) 5 MG tablet Take 5 mg by mouth as directed.          Past Medical History  Diagnosis Date  . Atrial fibrillation   . CAD (coronary artery disease)   . Mitral valve disorder   . HTN (hypertension)   . Dyslipidemia   . Aortic stenosis, mild   . Breast cyst   . Hypertrophic cardiomyopathy       Past Surgical History  Procedure Date  . Laparoscopic cholecystectomy 2009  . Cataract extraction 2003    left  . Mitral valve replacement 2007  . Neck mass excision 2003    No family history on file.  History   Social History  . Marital Status: Married    Spouse Name: N/A    Number of Children: N/A  . Years of Education: N/A   Occupational History  . Not on file.   Social History Main Topics  . Smoking status: Former Smoker    Quit date: 05/21/1990  . Smokeless tobacco: Not on file  . Alcohol Use: No  . Drug Use: Not on file  . Sexually Active: Not on file   Other Topics Concern  . Not on file   Social History Narrative  . No narrative on file    Review of Systems:  All systems reviewed.  They are negative to the above problem except as previously stated.  Vital Signs: BP 118/62  Pulse 63  Ht 5\' 4"  (1.626 m)  Wt 190 lb 6.4 oz (86.365 kg)  BMI 32.68 kg/m2  Physical Exam  HEENT:  Normocephalic, atraumatic. EOMI, PERRLA.  Neck: JVP is normal. No thyromegaly. No bruits.  Lungs: clear to auscultation. No rales no wheezes.  Heart: Regular rate and rhythm. Normal S1, S2. No S3.   GR III/VI systolic murmur at base  Abdomen:  Supple, nontender. Normal bowel sounds. No masses. No hepatomegaly.  Extremities:   Good distal pulses throughout. No lower extremity edema.  Musculoskeletal :moving all extremities.  Neuro:   alert and oriented x3.  CN II-XII grossly intact.  EKG:  Sinus rhythm.  63 bpm.  Firs degree AV blok.  LVH.  LAFB.  ST T wave changes consistent with repol abnormality, cannot exclude ischemia.   Assessment and Plan:   Addendum 06/26/11:    Since seen on 11/26 patient had echo done.  Showed severe LVH.  No signif gradient through LVOT at rest  Mild AS. Stress echo;  Patient did not exercise long on treadmil (stabe 1.  BP did drop initially, back to baseline.  O2 sat did drop some.  Did not drop in clinic with walking.  Patient set up for PFTs.   Showed moderate decrease in DLCO.  WESR was normal, going against amiodarone lung toxicity. Note patient had CT of chest done in January(after a fall)  This showed atherosclerosis of the L main and 3 coronary arteries. She had mild CAD on cath in 2008 (prior to valve surgery).Patient continues to have SOB.  Gives out easily. GIven all of above have recomm R and L heart cath to define.  Discussed with patient who understands and agrees to proceed.  Patient has be started on Rx for dye allergy.

## 2011-04-19 ENCOUNTER — Ambulatory Visit (HOSPITAL_COMMUNITY): Payer: Medicare Other | Attending: Cardiology | Admitting: Radiology

## 2011-04-19 DIAGNOSIS — R0609 Other forms of dyspnea: Secondary | ICD-10-CM | POA: Insufficient documentation

## 2011-04-19 DIAGNOSIS — R0989 Other specified symptoms and signs involving the circulatory and respiratory systems: Secondary | ICD-10-CM | POA: Insufficient documentation

## 2011-04-19 DIAGNOSIS — I079 Rheumatic tricuspid valve disease, unspecified: Secondary | ICD-10-CM | POA: Insufficient documentation

## 2011-04-19 DIAGNOSIS — R0602 Shortness of breath: Secondary | ICD-10-CM | POA: Insufficient documentation

## 2011-04-19 DIAGNOSIS — I359 Nonrheumatic aortic valve disorder, unspecified: Secondary | ICD-10-CM | POA: Insufficient documentation

## 2011-04-19 DIAGNOSIS — I10 Essential (primary) hypertension: Secondary | ICD-10-CM | POA: Insufficient documentation

## 2011-04-19 DIAGNOSIS — E785 Hyperlipidemia, unspecified: Secondary | ICD-10-CM | POA: Insufficient documentation

## 2011-04-19 DIAGNOSIS — I059 Rheumatic mitral valve disease, unspecified: Secondary | ICD-10-CM | POA: Insufficient documentation

## 2011-04-20 ENCOUNTER — Other Ambulatory Visit: Payer: Self-pay | Admitting: *Deleted

## 2011-04-22 NOTE — Assessment & Plan Note (Signed)
Concerning.  Will check labs.  Set up for echo to reevaluate gradient.  NO changes in meds for now.

## 2011-04-22 NOTE — Assessment & Plan Note (Signed)
Controlled.  

## 2011-04-22 NOTE — Assessment & Plan Note (Signed)
No symptoms of palpitations.

## 2011-04-24 ENCOUNTER — Telehealth: Payer: Self-pay | Admitting: *Deleted

## 2011-04-24 DIAGNOSIS — I10 Essential (primary) hypertension: Secondary | ICD-10-CM

## 2011-04-24 DIAGNOSIS — R0602 Shortness of breath: Secondary | ICD-10-CM

## 2011-04-24 NOTE — Telephone Encounter (Signed)
Notes Recorded by Jacqlyn Krauss, RN on 04/24/2011 at 3:37 PM I talked with Dr Tenny Craw. Dr Tenny Craw would like to schedule stress echo for 05/04/11. ------  Notes Recorded by Pricilla Riffle, MD on 04/24/2011 at 2:20 PM Spoke to patient on phone about echo.  I would like to schedule her for a stress echo to be done on a day when I am in clinic to evaluate gradients through LVOT> Please call me prior to scheduling

## 2011-04-24 NOTE — Telephone Encounter (Signed)
LMTCB for pt to give her time and date for stress echo 05/04/11 10:30am--pt to arrive at 10:15am.

## 2011-04-25 NOTE — Telephone Encounter (Signed)
I talked with pt's husband and gave him the appt time and instructions for the stress echo.

## 2011-05-04 ENCOUNTER — Ambulatory Visit (HOSPITAL_COMMUNITY): Payer: Medicare Other | Attending: Cardiovascular Disease | Admitting: Radiology

## 2011-05-04 ENCOUNTER — Other Ambulatory Visit (INDEPENDENT_AMBULATORY_CARE_PROVIDER_SITE_OTHER): Payer: Medicare Other | Admitting: *Deleted

## 2011-05-04 ENCOUNTER — Other Ambulatory Visit: Payer: Self-pay | Admitting: *Deleted

## 2011-05-04 DIAGNOSIS — R0602 Shortness of breath: Secondary | ICD-10-CM

## 2011-05-04 DIAGNOSIS — R0989 Other specified symptoms and signs involving the circulatory and respiratory systems: Secondary | ICD-10-CM | POA: Insufficient documentation

## 2011-05-04 DIAGNOSIS — E785 Hyperlipidemia, unspecified: Secondary | ICD-10-CM | POA: Insufficient documentation

## 2011-05-04 DIAGNOSIS — I4891 Unspecified atrial fibrillation: Secondary | ICD-10-CM | POA: Insufficient documentation

## 2011-05-04 DIAGNOSIS — I421 Obstructive hypertrophic cardiomyopathy: Secondary | ICD-10-CM | POA: Insufficient documentation

## 2011-05-04 DIAGNOSIS — I10 Essential (primary) hypertension: Secondary | ICD-10-CM

## 2011-05-04 DIAGNOSIS — R0609 Other forms of dyspnea: Secondary | ICD-10-CM | POA: Insufficient documentation

## 2011-05-04 LAB — BASIC METABOLIC PANEL
BUN: 17 mg/dL (ref 6–23)
CO2: 28 mEq/L (ref 19–32)
Calcium: 8.6 mg/dL (ref 8.4–10.5)
Creatinine, Ser: 0.8 mg/dL (ref 0.4–1.2)
Glucose, Bld: 94 mg/dL (ref 70–99)

## 2011-06-05 ENCOUNTER — Ambulatory Visit (INDEPENDENT_AMBULATORY_CARE_PROVIDER_SITE_OTHER): Payer: Medicare Other | Admitting: Internal Medicine

## 2011-06-05 DIAGNOSIS — R0989 Other specified symptoms and signs involving the circulatory and respiratory systems: Secondary | ICD-10-CM

## 2011-06-05 DIAGNOSIS — R0600 Dyspnea, unspecified: Secondary | ICD-10-CM

## 2011-06-05 LAB — PULMONARY FUNCTION TEST

## 2011-06-05 NOTE — Progress Notes (Signed)
PFT done today. 

## 2011-06-07 ENCOUNTER — Other Ambulatory Visit: Payer: Self-pay | Admitting: Internal Medicine

## 2011-06-07 ENCOUNTER — Ambulatory Visit
Admission: RE | Admit: 2011-06-07 | Discharge: 2011-06-07 | Disposition: A | Discharge status: Home / Self Care | Payer: Medicare Other | Source: Ambulatory Visit | Attending: Internal Medicine | Admitting: Internal Medicine

## 2011-06-11 ENCOUNTER — Other Ambulatory Visit: Payer: Self-pay

## 2011-06-11 ENCOUNTER — Encounter: Payer: Self-pay | Admitting: Internal Medicine

## 2011-06-11 MED ORDER — AMIODARONE HCL 200 MG PO TABS: 200.0000 mg | ORAL_TABLET | Freq: Every day | ORAL | Status: DC

## 2011-06-11 NOTE — Telephone Encounter (Signed)
..   Requested Prescriptions   Signed Prescriptions Disp Refills  . amiodarone (PACERONE) 200 MG tablet 30 tablet 3    Sig: Take 1 tablet (200 mg total) by mouth daily.    Authorizing Provider: Pricilla Riffle    Ordering User: Christella Hartigan, Dyllan Hughett Judie Petit

## 2011-06-18 ENCOUNTER — Other Ambulatory Visit: Payer: Self-pay | Admitting: *Deleted

## 2011-06-18 ENCOUNTER — Telehealth: Payer: Self-pay | Admitting: *Deleted

## 2011-06-18 DIAGNOSIS — R942 Abnormal results of pulmonary function studies: Secondary | ICD-10-CM

## 2011-06-18 NOTE — Telephone Encounter (Addendum)
Called patient concerning PFT's and she mentioned that she fell last week and hit her chest so Dr.Pharr had her complete a  chest CT that showed some plaque buildup. Therefore he increased her Crestor to 5mg  every day.  Will let Dr.Ross know.

## 2011-06-19 ENCOUNTER — Other Ambulatory Visit (INDEPENDENT_AMBULATORY_CARE_PROVIDER_SITE_OTHER): Payer: Medicare Other | Admitting: *Deleted

## 2011-06-19 DIAGNOSIS — J8409 Other alveolar and parieto-alveolar conditions: Secondary | ICD-10-CM

## 2011-06-19 DIAGNOSIS — R942 Abnormal results of pulmonary function studies: Secondary | ICD-10-CM

## 2011-06-19 LAB — SEDIMENTATION RATE: Sed Rate: 16 mm/hr (ref 0–22)

## 2011-06-20 ENCOUNTER — Other Ambulatory Visit: Payer: Self-pay | Admitting: *Deleted

## 2011-06-20 DIAGNOSIS — I4891 Unspecified atrial fibrillation: Secondary | ICD-10-CM

## 2011-06-21 ENCOUNTER — Other Ambulatory Visit (INDEPENDENT_AMBULATORY_CARE_PROVIDER_SITE_OTHER): Payer: Medicare Other | Admitting: *Deleted

## 2011-06-21 DIAGNOSIS — I4891 Unspecified atrial fibrillation: Secondary | ICD-10-CM

## 2011-06-21 LAB — PROTIME-INR
INR: 2.4 ratio — ABNORMAL HIGH (ref 0.8–1.0)
Prothrombin Time: 26.4 s — ABNORMAL HIGH (ref 10.2–12.4)

## 2011-06-21 LAB — CBC WITH DIFFERENTIAL/PLATELET
Basophils Absolute: 0 10*3/uL (ref 0.0–0.1)
Eosinophils Relative: 2.9 % (ref 0.0–5.0)
HCT: 37 % (ref 36.0–46.0)
Hemoglobin: 12.4 g/dL (ref 12.0–15.0)
Lymphocytes Relative: 10.5 % — ABNORMAL LOW (ref 12.0–46.0)
Monocytes Relative: 6.3 % (ref 3.0–12.0)
Neutro Abs: 5.2 10*3/uL (ref 1.4–7.7)
RDW: 16.4 % — ABNORMAL HIGH (ref 11.5–14.6)
WBC: 6.5 10*3/uL (ref 4.5–10.5)

## 2011-06-21 LAB — BASIC METABOLIC PANEL
CO2: 26 mEq/L (ref 19–32)
Chloride: 110 mEq/L (ref 96–112)
Creatinine, Ser: 0.7 mg/dL (ref 0.4–1.2)
Potassium: 3.6 mEq/L (ref 3.5–5.1)
Sodium: 142 mEq/L (ref 135–145)

## 2011-06-22 ENCOUNTER — Telehealth: Payer: Self-pay | Admitting: Internal Medicine

## 2011-06-22 HISTORY — PX: CARDIAC CATHETERIZATION: SHX172

## 2011-06-22 NOTE — Telephone Encounter (Signed)
Called patient back. She is aware to hold Coumadin starting now per Dr.Ross. Set up cath for 2/5. LM for [facility] lab to call me back with time of arrival Patient has advised me that she has an allergy  to IVP Dye. Will send meds to [facility] when I know her cath time.

## 2011-06-22 NOTE — Telephone Encounter (Signed)
Follow up from previous call.   Patient returning call back to nurse 

## 2011-06-25 ENCOUNTER — Ambulatory Visit (INDEPENDENT_AMBULATORY_CARE_PROVIDER_SITE_OTHER): Payer: Self-pay | Admitting: Cardiology

## 2011-06-25 DIAGNOSIS — Z9889 Other specified postprocedural states: Secondary | ICD-10-CM

## 2011-06-25 DIAGNOSIS — Z8679 Personal history of other diseases of the circulatory system: Secondary | ICD-10-CM

## 2011-06-25 DIAGNOSIS — R0989 Other specified symptoms and signs involving the circulatory and respiratory systems: Secondary | ICD-10-CM

## 2011-06-25 DIAGNOSIS — Z7901 Long term (current) use of anticoagulants: Secondary | ICD-10-CM

## 2011-06-25 LAB — POCT INR: INR: 1.4

## 2011-06-25 NOTE — Telephone Encounter (Signed)
Called patient back. Right and Left heart cath set for 2/5 at 1230pm at the [facility]. She is aware NPO after midnight. Patient to take Prednisone 60mg  at 630 pm tonight and 1030 am tomorrow. Also she will take Pepcid at bedtime tonight. Confirmed Coumadin still on hold.

## 2011-06-26 ENCOUNTER — Encounter (HOSPITAL_BASED_OUTPATIENT_CLINIC_OR_DEPARTMENT_OTHER): Payer: Self-pay | Admitting: Cardiology

## 2011-06-26 ENCOUNTER — Inpatient Hospital Stay (HOSPITAL_BASED_OUTPATIENT_CLINIC_OR_DEPARTMENT_OTHER)
Admission: RE | Admit: 2011-06-26 | Discharge: 2011-06-26 | Disposition: A | Discharge status: Home / Self Care | Payer: Medicare Other | Source: Ambulatory Visit | Attending: Cardiology | Admitting: Cardiology

## 2011-06-26 ENCOUNTER — Encounter (HOSPITAL_BASED_OUTPATIENT_CLINIC_OR_DEPARTMENT_OTHER)
Admission: RE | Disposition: A | Discharge status: Home / Self Care | Payer: Self-pay | Source: Ambulatory Visit | Attending: Cardiology

## 2011-06-26 DIAGNOSIS — I10 Essential (primary) hypertension: Secondary | ICD-10-CM | POA: Insufficient documentation

## 2011-06-26 DIAGNOSIS — I2789 Other specified pulmonary heart diseases: Secondary | ICD-10-CM | POA: Insufficient documentation

## 2011-06-26 DIAGNOSIS — R0989 Other specified symptoms and signs involving the circulatory and respiratory systems: Secondary | ICD-10-CM | POA: Insufficient documentation

## 2011-06-26 DIAGNOSIS — R0609 Other forms of dyspnea: Secondary | ICD-10-CM | POA: Insufficient documentation

## 2011-06-26 DIAGNOSIS — Z954 Presence of other heart-valve replacement: Secondary | ICD-10-CM | POA: Insufficient documentation

## 2011-06-26 DIAGNOSIS — I2510 Atherosclerotic heart disease of native coronary artery without angina pectoris: Secondary | ICD-10-CM | POA: Insufficient documentation

## 2011-06-26 DIAGNOSIS — R0602 Shortness of breath: Secondary | ICD-10-CM

## 2011-06-26 DIAGNOSIS — Z7901 Long term (current) use of anticoagulants: Secondary | ICD-10-CM | POA: Insufficient documentation

## 2011-06-26 DIAGNOSIS — I421 Obstructive hypertrophic cardiomyopathy: Secondary | ICD-10-CM | POA: Insufficient documentation

## 2011-06-26 DIAGNOSIS — E785 Hyperlipidemia, unspecified: Secondary | ICD-10-CM | POA: Insufficient documentation

## 2011-06-26 DIAGNOSIS — I4891 Unspecified atrial fibrillation: Secondary | ICD-10-CM | POA: Insufficient documentation

## 2011-06-26 LAB — POCT I-STAT 3, ART BLOOD GAS (G3+)
Acid-base deficit: 1 mmol/L (ref 0.0–2.0)
O2 Saturation: 99 %
TCO2: 23 mmol/L (ref 0–100)

## 2011-06-26 LAB — POCT I-STAT 3, VENOUS BLOOD GAS (G3P V)
Bicarbonate: 24.5 mEq/L — ABNORMAL HIGH (ref 20.0–24.0)
O2 Saturation: 73 %
TCO2: 26 mmol/L (ref 0–100)
pCO2, Ven: 40.9 mmHg — ABNORMAL LOW (ref 45.0–50.0)
pCO2, Ven: 42.7 mmHg — ABNORMAL LOW (ref 45.0–50.0)
pO2, Ven: 39 mmHg (ref 30.0–45.0)
pO2, Ven: 41 mmHg (ref 30.0–45.0)

## 2011-06-26 SURGERY — JV LEFT AND RIGHT HEART CATHETERIZATION WITH CORONARY ANGIOGRAM
Anesthesia: Moderate Sedation

## 2011-06-26 MED ORDER — FAMOTIDINE IN NACL 20-0.9 MG/50ML-% IV SOLN
20.0000 mg | INTRAVENOUS | Status: AC
  Administered 2011-06-26: 20 mg via INTRAVENOUS

## 2011-06-26 MED ORDER — DIPHENHYDRAMINE HCL 50 MG/ML IJ SOLN: 25.0000 mg | INTRAMUSCULAR | Status: DC

## 2011-06-26 MED ORDER — PREDNISONE 50 MG PO TABS: 60.0000 mg | ORAL_TABLET | ORAL | Status: DC

## 2011-06-26 MED ORDER — SODIUM CHLORIDE 0.9 % IJ SOLN: 3.0000 mL | INTRAMUSCULAR | Status: DC | PRN

## 2011-06-26 MED ORDER — SODIUM CHLORIDE 0.9 % IJ SOLN: 3.0000 mL | Freq: Two times a day (BID) | INTRAMUSCULAR | Status: DC

## 2011-06-26 MED ORDER — ACETAMINOPHEN 325 MG PO TABS: 650.0000 mg | ORAL_TABLET | ORAL | Status: DC | PRN

## 2011-06-26 MED ORDER — ASPIRIN 81 MG PO CHEW: 324.0000 mg | CHEWABLE_TABLET | ORAL | Status: DC

## 2011-06-26 MED ORDER — SODIUM CHLORIDE 0.9 % IV SOLN: INTRAVENOUS | Status: AC

## 2011-06-26 MED ORDER — SODIUM CHLORIDE 0.9 % IV SOLN
250.0000 mL | INTRAVENOUS | Status: DC | PRN
  Administered 2011-06-26: 250 mL via INTRAVENOUS

## 2011-06-26 MED ORDER — PREDNISONE 50 MG PO TABS
60.0000 mg | ORAL_TABLET | ORAL | Status: AC
  Administered 2011-06-26: 60 mg via ORAL

## 2011-06-26 MED ORDER — ONDANSETRON HCL 4 MG/2ML IJ SOLN: 4.0000 mg | Freq: Four times a day (QID) | INTRAMUSCULAR | Status: DC | PRN

## 2011-06-26 MED ORDER — SODIUM CHLORIDE 0.9 % IV SOLN: INTRAVENOUS | Status: DC

## 2011-06-26 NOTE — OR Nursing (Signed)
Tegaderm dressing applied, site level 0, bedrest begins at 1420 

## 2011-06-26 NOTE — Progress Notes (Signed)
Discharge instructions completed, ambulated to bathroom without bleeding from right groin site.  Discharged to home via wheelchair with husband. 

## 2011-06-26 NOTE — H&P (Signed)
HPI Patient is a 78 year old with a history of HOCM, very mild CAD (cath 2008, 30% narrowings) hypertension, PAF, dyslipidemia. I saw her in clinic just 2 wks ago. I added 0.1 Clonidine Bid to her regimen.   She said since seen she has developed progressive SOB with activity. Fine at rest. No PND. Weight has been stable. No worsening edema in legs, they are actually getting better. No palpitations. NO dizziness.   When I saw her she was profoundly SOB> EKG with profound breadycardia.  Mediciens adjusted. SInce seen the change breathing got better then it got worse.  No CP.  No PND.  No dizziness.         Allergies   Allergen  Reactions   .  Nitrofurantoin           Current Outpatient Prescriptions   Medication  Sig  Dispense  Refill   .  amiodarone (PACERONE) 200 MG tablet  Take 100 mg by mouth daily.           .  Calcium 600-200 MG-UNIT per tablet  Take 2 tablets by mouth daily.           .  cholecalciferol (VITAMIN D) 1000 UNITS tablet  Take 1,000 Units by mouth daily.           .  fish oil-omega-3 fatty acids 1000 MG capsule  Take 2 g by mouth daily.           .  lansoprazole (PREVACID) 30 MG capsule  Take 30 mg by mouth as needed.           [name]  losartan (COZAAR) 100 MG tablet  Take 1 tablet (100 mg total) by mouth daily.   30 tablet   6   .  Multiple Vitamin (MULTIVITAMIN) tablet  Take 1 tablet by mouth daily.           .  rosuvastatin (CRESTOR) 5 MG tablet  Take 1.25 mg by mouth daily.           .  sertraline (ZOLOFT) 25 MG tablet  Take 25 mg by mouth daily.           .  vitamin C (ASCORBIC ACID) 500 MG tablet  Take 500 mg by mouth 2 (two) times daily.           [name]  warfarin (COUMADIN) 5 MG tablet  Take 5 mg by mouth as directed.                 Past Medical History   Diagnosis  Date   .  Atrial fibrillation     .  CAD (coronary artery disease)     .  Mitral valve disorder     .  HTN (hypertension)     .  Dyslipidemia     .  Aortic stenosis, mild     .  Breast cyst       .  Hypertrophic cardiomyopathy           Past Surgical History   Procedure  Date   .  Laparoscopic cholecystectomy  2009   .  Cataract extraction  2003       left   .  Mitral valve replacement  2007   .  Neck mass excision  2003        No family history on file.    History       Social History   .  Marital Status:  Married       Spouse Name:  N/A       Number of Children:  N/A   .  Years of Education:  N/A       Occupational History   .  Not on file.       Social History Main Topics   .  Smoking status:  Former Smoker       Quit date:  05/21/1990   .  Smokeless tobacco:  Not on file   .  Alcohol Use:  No   .  Drug Use:  Not on file   .  Sexually Active:  Not on file       Other Topics  Concern   .  Not on file       Social History Narrative   .  No narrative on file        Review of Systems:  All systems reviewed.  They are negative to the above problem except as previously stated.   Vital Signs: BP 118/62  Pulse 63  Ht 5\' 4"  (1.626 m)  Wt 190 lb 6.4 oz (86.365 kg)  BMI 32.68 kg/m2   Physical Exam   HEENT:  Normocephalic, atraumatic. EOMI, PERRLA.   Neck: JVP is normal. No thyromegaly. No bruits.  Lungs: clear to auscultation. No rales no wheezes.  Heart: Regular rate and rhythm. Normal S1, S2. No S3.   GR III/VI systolic murmur at base  Abdomen:  Supple, nontender. Normal bowel sounds. No masses. No hepatomegaly.   Extremities:   Good distal pulses throughout. No lower extremity edema.  Musculoskeletal :moving all extremities.  Neuro:   alert and oriented x3.  CN II-XII grossly intact.   EKG:  Sinus rhythm.  63 bpm.  Firs degree AV blok.  LVH.  LAFB.  ST T wave changes consistent with repol abnormality, cannot exclude ischemia.     Assessment and Plan:     Addendum 06/26/11:     Since seen on 11/26 patient had echo done.  Showed severe LVH.  No signif gradient through LVOT at rest  Mild AS. Stress echo;  Patient did not  exercise long on treadmil (stabe 1.  BP did drop initially, back to baseline.  O2 sat did drop some.  Did not drop in clinic with walking.   Patient set up for PFTs.  Showed moderate decrease in DLCO.  WESR was normal, going against amiodarone lung toxicity. Note patient had CT of chest done in January(after a fall)  This showed atherosclerosis of the L main and 3 coronary arteries. She had mild CAD on cath in 2008 (prior to valve surgery).Patient continues to have SOB.  Gives out easily. GIven all of above have recomm R and L heart cath to define.  Discussed with patient who understands and agrees to proceed.  Patient has be started on Rx for dye allergy.   See note of Dr. Tenny Craw.  She has scheduled Ms. Kastens for R and L heart cath for progressive dyspnea.  I have explained the procedure to the patient, including the risks and benefits.  She has consented to proceed.  Notably, she has received her pretreatment.    [name] 12:50 PM 06/26/2011

## 2011-06-26 NOTE — Op Note (Signed)
Cardiac Catheterization Procedure Note  Name: Patricia [name] MRN: [id number] DOB: 1927/04/02  Procedure: Right Heart Cath, Left Heart Cath, Selective Coronary Angiography, LV angiography  Indication: progressive dyspnea status post MVR.     Procedural Details: The right groin was prepped, draped, and anesthetized with 1% lidocaine. Using the modified Seldinger technique a 4 French sheath was placed in the right femoral artery and a 7 French sheath was placed in the right femoral vein. A Swan-Ganz catheter was used for the right heart catheterization. Standard protocol was followed for recording of right heart pressures and sampling of oxygen saturations. Fick cardiac output was calculated. Standard Judkins catheters were used for selective coronary angiography and left ventriculography. There were no immediate procedural complications. The patient was transferred to the post catheterization recovery area for further monitoring.  Procedural Findings: Hemodynamics RA 10 RV 53/10 PA 53/20 ((35) PCWP 24 LV 184/23 AO 172/73 (110)  Oxygen saturations: PA 73 AO 99 SVC  74  Cardiac Output (Fick) 5.4 L/m  Cardiac Index (Fick) 2.9 L/m/m2   Coronary angiography: Coronary dominance: right  Left mainstem: Large caliber vessel that tapers distally  Left anterior descending (LAD): Diffuse calcification with mild plaquing, but no critical obstruction.  Large diagonal has 20% proximal narrowing.    Left circumflex (LCx): There may be mild abnormality at the takeoff of the ramus and av circ.  The ramus has mild segmental plaque.  The av cir provides a large marginal without obstruction.  Right coronary artery (RCA): Large caliber vessel free of disease.    Left ventriculography: Left ventricular systolic function is normal is vigorous with EF greater than 65%.  There appears to be significant MR, that is difficult to gauge.  There is a prosthetic valve with exentsive MAC, and perivavlular leak  cannot be excluded.    Final Conclusions:   1.  Prosthetic mitral valve with mitral regurgitation of uncertain severity 2.  No critical CAD 3.  Pulmonary hypertension, secondary to MR and possibly diastolic dysfunction  Recommendations:  1.  Consider TEE for better evaluation of the valve.     [name] 06/26/2011, 2:10 PM

## 2011-06-26 NOTE — Interval H&P Note (Signed)
History and Physical Interval Note:  06/26/2011 12:51 PM  Patricia [name]  has presented today for surgery, with the diagnosis of sob  The various methods of treatment have been discussed with the patient and family. After consideration of risks, benefits and other options for treatment, the patient has consented to  Procedure(s): JV LEFT AND RIGHT HEART CATHETERIZATION WITH CORONARY ANGIOGRAM as a surgical intervention .  The patients' history has been reviewed, patient examined, no change in status, stable for surgery.  I have reviewed the patients' chart and labs.  Questions were answered to the patient's satisfaction.     [name]

## 2011-06-26 NOTE — OR Nursing (Signed)
Meal served 

## 2011-07-09 ENCOUNTER — Ambulatory Visit (INDEPENDENT_AMBULATORY_CARE_PROVIDER_SITE_OTHER): Payer: Medicare Other | Admitting: Internal Medicine

## 2011-07-09 ENCOUNTER — Encounter: Payer: Self-pay | Admitting: Internal Medicine

## 2011-07-09 ENCOUNTER — Telehealth: Payer: Self-pay | Admitting: Internal Medicine

## 2011-07-09 VITALS — BP 132/86 | HR 64 | Ht 64.0 in | Wt 188.8 lb

## 2011-07-09 DIAGNOSIS — I2510 Atherosclerotic heart disease of native coronary artery without angina pectoris: Secondary | ICD-10-CM

## 2011-07-09 DIAGNOSIS — R0609 Other forms of dyspnea: Secondary | ICD-10-CM

## 2011-07-09 DIAGNOSIS — R0600 Dyspnea, unspecified: Secondary | ICD-10-CM

## 2011-07-09 DIAGNOSIS — I10 Essential (primary) hypertension: Secondary | ICD-10-CM

## 2011-07-09 DIAGNOSIS — Z8679 Personal history of other diseases of the circulatory system: Secondary | ICD-10-CM

## 2011-07-09 DIAGNOSIS — I059 Rheumatic mitral valve disease, unspecified: Secondary | ICD-10-CM

## 2011-07-09 DIAGNOSIS — E785 Hyperlipidemia, unspecified: Secondary | ICD-10-CM

## 2011-07-09 NOTE — Telephone Encounter (Signed)
New msg Pt was calling back about appt with Pulmonary physician. Please call her back

## 2011-07-09 NOTE — Progress Notes (Signed)
HPI Patient is a 78 year old with a history of HOCM, very mild CAD (cath 2008, 30% narrowings) hypertension, PAF, dyslipidemia. Limited stress echo showed no signif increase in LVOT gradient.  She has had continued SOB.  Recent CT showed coronary calcifications.  Based on this I recomm a R and L heart cath.  This showed no critical CAD, MR through mitral prosthesis of uncertain severity.  PA pressures were increased with RA pressure of 53/10 and PCWP 24   The patient says she is feeling less SOB since cath  NoCP  Allergies  Allergen Reactions  . Ivp Dye (Iodinated Diagnostic Agents)   . Nitrofurantoin     Current Outpatient Prescriptions  Medication Sig Dispense Refill  . amiodarone (PACERONE) 200 MG tablet Take 100 mg by mouth daily.      . Calcium 600-200 MG-UNIT per tablet Take 2 tablets by mouth daily.        . cholecalciferol (VITAMIN D) 1000 UNITS tablet Take 1,000 Units by mouth daily.        . Coenzyme Q10 (COQ10 PO) Take 300 mg by mouth daily.      . fish oil-omega-3 fatty acids 1000 MG capsule Take 2 g by mouth daily.        . lansoprazole (PREVACID) 30 MG capsule Take 30 mg by mouth as needed.        [name] losartan (COZAAR) 100 MG tablet Take 1 tablet (100 mg total) by mouth daily.  30 tablet  6  . Multiple Vitamin (MULTIVITAMIN) tablet Take 1 tablet by mouth daily.        . rosuvastatin (CRESTOR) 5 MG tablet Take 5 mg by mouth daily.       . sertraline (ZOLOFT) 25 MG tablet Take 25 mg by mouth daily.        . vitamin C (ASCORBIC ACID) 500 MG tablet Take 500 mg by mouth 2 (two) times daily.        [name] warfarin (COUMADIN) 5 MG tablet Take 5 mg by mouth as directed.          Past Medical History  Diagnosis Date  . Atrial fibrillation   . CAD (coronary artery disease)   . Mitral valve disorder   . HTN (hypertension)   . Dyslipidemia   . Aortic stenosis, mild   . Breast cyst   . Hypertrophic cardiomyopathy     Past Surgical History  Procedure Date  . Laparoscopic  cholecystectomy 2009  . Cataract extraction 2003    left  . Mitral valve replacement 2007  . Neck mass excision 2003    No family history on file.  History   Social History  . Marital Status: Married    Spouse Name: N/A    Number of Children: N/A  . Years of Education: N/A   Occupational History  . Not on file.   Social History Main Topics  . Smoking status: Former Smoker    Quit date: 05/21/1990  . Smokeless tobacco: Not on file  . Alcohol Use: No  . Drug Use: Not on file  . Sexually Active: Not on file   Other Topics Concern  . Not on file   Social History Narrative  . No narrative on file    Review of Systems:  All systems reviewed.  They are negative to the above problem except as previously stated.  Vital Signs: BP 132/86  Pulse 64  Ht 5\' 4"  (1.626 m)  Wt 188 lb 12.8 oz (85.639  kg)  BMI 32.41 kg/m2  SpO2 92%  Physical Exam Pt in NAD HEENT:  Normocephalic, atraumatic. EOMI, PERRLA.  Neck: JVP is normal. No thyromegaly. No bruits.  Lungs: clear to auscultation. No rales no wheezes.  Heart: Regular rate and rhythm. Normal S1, S2. No S3.   No significant murmurs. PMI not displaced.  Abdomen:  Supple, nontender. Normal bowel sounds. No masses. No hepatomegaly.  Extremities:   Good distal pulses throughout. No lower extremity edema.  Musculoskeletal :moving all extremities.  Neuro:   alert and oriented x3.  CN II-XII grossly intact.  EKG:  SR 64  LVH with repolarization abnormality   Assessment and Plan:

## 2011-07-09 NOTE — Telephone Encounter (Signed)
Called patient . She states that she worked it out to see Dr.Byrum on Wednesday.

## 2011-07-11 ENCOUNTER — Ambulatory Visit (INDEPENDENT_AMBULATORY_CARE_PROVIDER_SITE_OTHER): Payer: Medicare Other | Admitting: Emergency Medicine

## 2011-07-11 ENCOUNTER — Encounter: Payer: Self-pay | Admitting: Emergency Medicine

## 2011-07-11 VITALS — BP 132/76 | HR 68 | Temp 98.1°F | Ht 64.0 in | Wt 186.8 lb

## 2011-07-11 DIAGNOSIS — R0609 Other forms of dyspnea: Secondary | ICD-10-CM

## 2011-07-11 DIAGNOSIS — R0600 Dyspnea, unspecified: Secondary | ICD-10-CM

## 2011-07-11 DIAGNOSIS — I2789 Other specified pulmonary heart diseases: Secondary | ICD-10-CM

## 2011-07-11 DIAGNOSIS — IMO0002 Reserved for concepts with insufficient information to code with codable children: Secondary | ICD-10-CM

## 2011-07-11 DIAGNOSIS — J449 Chronic obstructive pulmonary disease, unspecified: Secondary | ICD-10-CM

## 2011-07-11 DIAGNOSIS — R0902 Hypoxemia: Secondary | ICD-10-CM | POA: Insufficient documentation

## 2011-07-11 NOTE — Patient Instructions (Addendum)
Your breathing tests show some mild COPD or airflow abnormality. You may benefit from an inhaled medication to improve airflow. We will start Xopenex 2 puffs up to every 4 hours if needed. You should use this if you have shortness of breath after exertion or before you embark on heavy exertion. Keep track of how you feel so we can discuss whether this medicine has been helpful. If you experience fast heart rate or palpitations on this medication, stop it and call Dr Delton Coombes.   Your walking oximetry shows that your oxygen level drops with exertion. This may is probably largely related to your overall heart function, with possible contributions from your lung function. You should benefit from starting oxygen when you exert. We will order today. Wear 2L/min whenever you walk or exert yourself. You do not need to wear at rest or while sleeping.   Your CT scan of the chest is not suggestive of amiodarone lung toxicity. Dr Delton Coombes will review the literature and discuss with Dr Tenny Craw regarding whether there may be any benefit from changing your amiodarone even with an unremarkable CT scan. For now you should stay on the amiodarone as directed by Dr Tenny Craw.  Please follow up with Dr Delton Coombes in 1 month to discuss how you are doing with these changes.

## 2011-07-11 NOTE — Assessment & Plan Note (Signed)
Her dyspnea is multifactorial, but all evidence points to largest contributions from chronic diastolic/obstructive heart failure (+/- MV effects) with associated secondary pulmonary hypertension. This would explain her low DLCO in absence of significant obstructive disease on PFT or interstitial disease on CT scan of the chest. The clear CT scan practically eliminates amiodarone toxicity from the differential dx, and I believe she can safely continue this medication. Most important new observation is her hypoxemia which will ned to be treated. There is still consideration for optimization of her cards status, ie TEE and possible catheter valve procedure, although she doesn't sound enthusiastic about invasive procedure. It is possible that she will gain significant ground just by treating the hypoxemia, then doing cardiopulm rehab, etc.  - start O2 as above - trial xopenex to see if it offers anything beneficial - cards therapy as guided by Dr Tenny Craw, possibly to include TEE depending on course and the patient's wishes/goals - anticoagulation will benefit both A fib and pulm HTN - consider cardiopulm rehab in the future - she will probably merit sleep evaluation, at least an ONO, to insure that we are keeping her well oxygenated at all times.  - I will review her status in a month

## 2011-07-11 NOTE — Assessment & Plan Note (Addendum)
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 Although this is likely only a small contributor to her overall dyspnea, should be easy and non-invasive for [place] to initiate treatment trial - start Xopenex (since A Fib) prn and prior to exertion, then review efficacy in about a month

## 2011-07-11 NOTE — Progress Notes (Signed)
Subjective:    Patient ID: Patricia [name], female    DOB: [birth year], 78 y.o.   MRN: [id number]  HPI Pleasant 78 yo woman followed by Drs Renne Crigler and Tenny Craw. She is a former smoker (40 pack-yrs) and has a history of mechanical MVR, atrial fibrillation, HTN with a hypertrophic LV and diastolic dysfunction w HOCM physiology. She has had some difficulty and functional decline in the past when either tachycardic or bradycardic, has been successfully managed with amiodarone.   Tells me that she has noticed slow progressive dyspnea over approximately 4-5 months, such that previously easy tasks like walking > 151m, shopping, have become difficult. She has needed to rest after about 153m, can't walk up hills. She has NOT heard wheeze, experienced CP or cough either at rest or with exertion. Her daughter has not heard any wheeze or upper airway noise. A review of Cardiology notes reveals that she has had SOB to a lesser degree farther back than Fall 2012 >> clonidine 0.1mg  was added to HTN regimen 5/25 but had to be stopped 6/8 due to symptomatic (dyspneic) bradycardia. She initially rebounded off the clonidine, but then experienced more SOB despite improvement in volume status and mild edema.    The subsequent evaluation included Stress TTE 12/14 showed low exercise tolerance but a small change in gradient w exertion:mLVOT gradient of at rest, w exertion; peakLVOT gradient at rest, to w exertion.  Unfortunately in late December she had an apparent URI that precipitated sx consistent with an AE-COPD that was treated with abx + prednisone (first and only time this has happened). She states that this illness dragged her down significantly, took several weeks for her to recover, experienced cough, dyspnea, malaise, decreased functional capacity.  PFT done 06/05/11 showed Mild AFL without BD response, normal volumes, decreased DLCO that corrects to normal range for Va.  She experienced a fall 1/17  with impact on her R chest, prompted a CT scan of the chest that showed some very mild basilar changes that could suggest interstitial process, but these are very subtle and could reflect atx or old mild scar. Based on her continued and now progressive dyspnea, and on coronary calcifications seen on chest CT, she had L and R heart cath on 06/26/11. I do not have the L heart data available but the patient tells me that her CAD has not progressed significantly compared with 2008 study. The R heart study showed no significant gradient across the AV, LVEDP 24, CO Fick 5.4 and Thermodil 4.0, mean PAOP between 20 - 26, RVP 53/5, PAP 50/20 (35). PVR 1.7 (Fick) and 2.3 (Thermodil).  She was evaluated by Dr Tenny Craw 2/18 and noted to be hypoxemic with ambulation.   Review of Systems  Constitutional: Negative.  Negative for fever and unexpected weight change.  HENT: Positive for congestion and dental problem. Negative for ear pain, nosebleeds, sore throat, rhinorrhea, sneezing, trouble swallowing, postnasal drip and sinus pressure.   Eyes: Negative.  Negative for redness and itching.  Respiratory: Positive for shortness of breath. Negative for cough, chest tightness and wheezing.   Cardiovascular: Positive for palpitations. Negative for leg swelling.  Gastrointestinal: Negative.  Negative for nausea and vomiting.  Genitourinary: Negative.  Negative for dysuria.  Musculoskeletal: Negative.  Negative for joint swelling.  Skin: Negative.  Negative for rash.  Neurological: Negative.  Negative for headaches.  Hematological: Negative.  Does not bruise/bleed easily.  Psychiatric/Behavioral: Negative for dysphoric mood. The patient is nervous/anxious.  Past Medical History  Diagnosis Date  . Atrial fibrillation   . CAD (coronary artery disease)   . Mitral valve disorder   . HTN (hypertension)   . Dyslipidemia   . Aortic stenosis, mild   . Breast cyst   . Hypertrophic cardiomyopathy      No family history on  file.   History   Social History  . Marital Status: Married    Spouse Name: N/A    Number of Children: N/A  . Years of Education: N/A   Occupational History  . Not on file.   Social History Main Topics  . Smoking status: Former Smoker -- 1.0 packs/day for 40 years    Types: Cigarettes    Quit date: 05/21/1990  . Smokeless tobacco: Not on file  . Alcohol Use: No  . Drug Use: No  . Sexually Active: Not on file   Other Topics Concern  . Not on file   Social History Narrative  . No narrative on file     Allergies  Allergen Reactions  . Ivp Dye (Iodinated Diagnostic Agents)   . Nitrofurantoin      Outpatient Prescriptions Prior to Visit  Medication Sig Dispense Refill  . amiodarone (PACERONE) 200 MG tablet Take 100 mg by mouth daily.      . Calcium 600-200 MG-UNIT per tablet Take 2 tablets by mouth daily.        . cholecalciferol (VITAMIN D) 1000 UNITS tablet Take 1,000 Units by mouth daily.        . Coenzyme Q10 (COQ10 PO) Take 300 mg by mouth daily.      . fish oil-omega-3 fatty acids 1000 MG capsule Take 2 g by mouth daily.        . lansoprazole (PREVACID) 30 MG capsule Take 30 mg by mouth as needed.        [name] losartan (COZAAR) 100 MG tablet Take 1 tablet (100 mg total) by mouth daily.  30 tablet  6  . Multiple Vitamin (MULTIVITAMIN) tablet Take 1 tablet by mouth daily.        . rosuvastatin (CRESTOR) 5 MG tablet Take 5 mg by mouth daily.       . sertraline (ZOLOFT) 25 MG tablet Take 25 mg by mouth daily.        . vitamin C (ASCORBIC ACID) 500 MG tablet Take 500 mg by mouth 2 (two) times daily.        [name] warfarin (COUMADIN) 5 MG tablet Take 5 mg by mouth as directed.             Objective:   Physical Exam  Gen: Pleasant, well-nourished, slightly overweight, mild kyphosis, in no distress,  normal affect  ENT: No lesions,  mouth clear,  oropharynx clear, no postnasal drip, Mal Class 4 airway  Neck: No JVD, no TMG, no carotid bruits  Lungs: No use of accessory  muscles, no dullness to percussion, clear without rales or rhonchi, no wheeze w normal breath or on forced exp  Cardiovascular: RRR, heart sounds normal, holosystolic M with intact mechanical S2, no LE edema  Musculoskeletal: No deformities, no cyanosis or clubbing, scar L wrist, intact pulses  Neuro: alert, non focal  Skin: Warm, no lesions or rashes, normal cap refill     Assessment & Plan:  COPD, mild Mild AFL by spiro from 06/2011, and suggested by her apparent AE in 12/12 after a URI. Although this is likely only a small contributor to her overall dyspnea, should  be easy and non-invasive for [place] to initiate treatment trial - start Xopenex (since A Fib) prn and prior to exertion, then review efficacy in about a month  Dyspnea Her dyspnea is multifactorial, but all evidence points to largest contributions from chronic diastolic/obstructive heart failure (+/- MV effects) with associated secondary pulmonary hypertension. This would explain her low DLCO in absence of significant obstructive disease on PFT or interstitial disease on CT scan of the chest. The clear CT scan practically eliminates amiodarone toxicity from the differential dx, and I believe she can safely continue this medication. Most important new observation is her hypoxemia which will ned to be treated. There is still consideration for optimization of her cards status, ie TEE and possible catheter valve procedure, although she doesn't sound enthusiastic about invasive procedure. It is possible that she will gain significant ground just by treating the hypoxemia, then doing cardiopulm rehab, etc.  - start O2 as above - trial xopenex to see if it offers anything beneficial - cards therapy as guided by Dr Tenny Craw, possibly to include TEE depending on course and the patient's wishes/goals - anticoagulation will benefit both A fib and pulm HTN - consider cardiopulm rehab in the future - she will probably merit sleep evaluation, at least  an ONO, to insure that we are keeping her well oxygenated at all times.  - I will review her status in a month  Exercise hypoxemia Confirmed today on walking oximetry. She dropped to 85% before she sensed dyspnea. Consistent w her diffusion defect, likely related to L sided heart disease and associated PAH.  - discussed O2 with her, she is will ing to start using w exertion. Will start 2L/min w exertion.   Pulmonary hypertension, secondary No ILD, very mild COPD. No reason to suspect PE or auto-immune disease. Explanation for PAH is L sided heart disease (note low PVR, high PCWP). Most common other contributor would be OSA - will need to at least screen for this with ONO, may need PSG.  - I will defer any eval of sleep oxygenation for now, approach later after she has processed and embraced idea of O2 on exertion.     Levy Pupa, MD, PhD 07/11/2011, 10:11 PM [facility] [phone] or if no answer [phone]

## 2011-07-11 NOTE — Assessment & Plan Note (Signed)
Confirmed today on walking oximetry. She dropped to 85% before she sensed dyspnea. Consistent w her diffusion defect, likely related to L sided heart disease and associated PAH.  - discussed O2 with her, she is will ing to start using w exertion. Will start 2L/min w exertion.

## 2011-07-11 NOTE — Assessment & Plan Note (Signed)
No ILD, very mild COPD. No reason to suspect PE or auto-immune disease. Explanation for PAH is L sided heart disease (note low PVR, high PCWP). Most common other contributor would be OSA - will need to at least screen for this with ONO, may need PSG.  - I will defer any eval of sleep oxygenation for now, approach later after she has processed and embraced idea of O2 on exertion.

## 2011-07-18 NOTE — Assessment & Plan Note (Signed)
Will need to review echo  Pt is not interested in another surgery if that were found  I am not convinced this is the source of all her dyspnea  LV is thick with vigorous function.

## 2011-07-18 NOTE — Assessment & Plan Note (Signed)
Pt on amiodarone  ESR is normal  CT does not show fibrosis

## 2011-07-18 NOTE — Assessment & Plan Note (Signed)
Keep on Crestor

## 2011-07-18 NOTE — Assessment & Plan Note (Signed)
Mild by recent cath

## 2011-07-18 NOTE — Assessment & Plan Note (Signed)
Will need to follow. 

## 2011-07-18 NOTE — Assessment & Plan Note (Signed)
Patient with signif dyspnea.  R heart cath with elevated pressures Today in clinic patient's oxygen saturation decreased with walking .  The desaturation seems. out of proportion to these numbers.  I do not think this represents pulm tox from amio. But, I am not sure.  RDelton Coombes will see patient to evaluate.  Hold on further testing. Arrange from home O2.

## 2011-07-26 ENCOUNTER — Ambulatory Visit: Payer: Medicare Other | Admitting: Internal Medicine

## 2011-08-02 ENCOUNTER — Other Ambulatory Visit: Payer: Self-pay | Admitting: *Deleted

## 2011-08-02 ENCOUNTER — Other Ambulatory Visit: Payer: Self-pay | Admitting: Internal Medicine

## 2011-08-02 MED ORDER — WARFARIN SODIUM 5 MG PO TABS: 5.0000 mg | ORAL_TABLET | ORAL | Status: DC

## 2011-08-02 NOTE — Telephone Encounter (Signed)
Pt is out of pills 

## 2011-08-08 ENCOUNTER — Ambulatory Visit (INDEPENDENT_AMBULATORY_CARE_PROVIDER_SITE_OTHER): Payer: Medicare Other | Admitting: Emergency Medicine

## 2011-08-08 ENCOUNTER — Encounter: Payer: Self-pay | Admitting: Emergency Medicine

## 2011-08-08 VITALS — BP 142/90 | HR 71 | Temp 98.1°F | Ht 64.0 in | Wt 193.4 lb

## 2011-08-08 DIAGNOSIS — I2789 Other specified pulmonary heart diseases: Secondary | ICD-10-CM

## 2011-08-08 DIAGNOSIS — R0902 Hypoxemia: Secondary | ICD-10-CM

## 2011-08-08 DIAGNOSIS — IMO0002 Reserved for concepts with insufficient information to code with codable children: Secondary | ICD-10-CM

## 2011-08-08 DIAGNOSIS — J449 Chronic obstructive pulmonary disease, unspecified: Secondary | ICD-10-CM

## 2011-08-08 NOTE — Assessment & Plan Note (Signed)
Believes that she has benefited some from the xopenex. Would continue this prn and pretreating exercise

## 2011-08-08 NOTE — Assessment & Plan Note (Signed)
Encouraged her to wear the portable O2 with all exertion given her rapid desats on RA. She understands and is willing to do this

## 2011-08-08 NOTE — Patient Instructions (Signed)
Start wearing your oxygen whenever you are up exerting yourself. Use the portable tanks when you are out of the house.  Continue to try Xopenex 2 puffs before significant exercise or if you become short of breath We will set up a sleep study today Follow with Dr Delton Coombes in 2 months to review your status and the sleep study.

## 2011-08-08 NOTE — Progress Notes (Signed)
Subjective:    Patient ID: Patricia [name], female    DOB: October 23, 1926, 78 y.o.   MRN: [id number]  HPI Pleasant 78 yo woman followed by Drs Renne Crigler and Tenny Craw. She is a former smoker (40 pack-yrs) and has a history of mechanical MVR, atrial fibrillation, HTN with a hypertrophic LV and diastolic dysfunction w HOCM physiology. She has had some difficulty and functional decline in the past when either tachycardic or bradycardic, has been successfully managed with amiodarone.   Tells me that she has noticed slow progressive dyspnea over approximately 4-5 months, such that previously easy tasks like walking > 117m, shopping, have become difficult. She has needed to rest after about 132m, can't walk up hills. She has NOT heard wheeze, experienced CP or cough either at rest or with exertion. Her daughter has not heard any wheeze or upper airway noise. A review of Cardiology notes reveals that she has had SOB to a lesser degree farther back than Fall 2012 >> clonidine 0.1mg  was added to HTN regimen 5/25 but had to be stopped 6/8 due to symptomatic (dyspneic) bradycardia. She initially rebounded off the clonidine, but then experienced more SOB despite improvement in volume status and mild edema.    The subsequent evaluation included Stress TTE 12/14 showed low exercise tolerance but a small change in gradient w exertion:mLVOT gradient of at rest, w exertion; peakLVOT gradient at rest, to w exertion.  Unfortunately in late December she had an apparent URI that precipitated sx consistent with an AE-COPD that was treated with abx + prednisone (first and only time this has happened). She states that this illness dragged her down significantly, took several weeks for her to recover, experienced cough, dyspnea, malaise, decreased functional capacity.  PFT done 06/05/11 showed Mild AFL without BD response, normal volumes, decreased DLCO that corrects to normal range for Va.  She experienced a fall 1/17  with impact on her R chest, prompted a CT scan of the chest that showed some very mild basilar changes that could suggest interstitial process, but these are very subtle and could reflect atx or old mild scar. Based on her continued and now progressive dyspnea, and on coronary calcifications seen on chest CT, she had L and R heart cath on 06/26/11. I do not have the L heart data available but the patient tells me that her CAD has not progressed significantly compared with 2008 study. The R heart study showed no significant gradient across the AV, LVEDP 24, CO Fick 5.4 and Thermodil 4.0, mean PAOP between 20 - 26, RVP 53/5, PAP 50/20 (35). PVR 1.7 (Fick) and 2.3 (Thermodil).  She was evaluated by Dr Tenny Craw 2/18 and noted to be hypoxemic with ambulation.   ROV 08/08/11 -- Pleasant 78 yo woman w A fib, diastolic heart dz, MV disease and secondary PAH. 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 Last time we started O2 with exertion, trial xopenex. Plan to schedule PSG as a possible contributor to secondary PAH. BP was high today, 140/90. She has been using the O2 at home, but only on reserve when she is out of the house. She has used the xopenex prior to significant exertion, thinks it may have helped her some.      Objective:   Physical Exam  Filed Vitals:   08/08/11 1443  BP: 142/90  Pulse: 71  Temp: 98.1 F (36.7 C)   Gen: Pleasant, well-nourished, slightly overweight, mild kyphosis, in no distress,  normal affect  ENT: No  lesions,  mouth clear,  oropharynx clear, no postnasal drip, Mal Class 4 airway  Neck: No JVD, no TMG, no carotid bruits  Lungs: No use of accessory muscles, no dullness to percussion, clear without rales or rhonchi, no wheeze w normal breath or on forced exp  Cardiovascular: RRR, heart sounds normal, holosystolic M with intact mechanical S2, no LE edema  Musculoskeletal: No deformities, no cyanosis or clubbing, scar L wrist, intact pulses  Neuro: alert, non focal  Skin: Warm, no  lesions or rashes, normal cap refill     Assessment & Plan:  Exercise hypoxemia Encouraged her to wear the portable O2 with all exertion given her rapid desats on RA. She understands and is willing to do this  COPD, mild Believes that she has benefited some from the xopenex. Would continue this prn and pretreating exercise  Pulmonary hypertension, secondary - set up PSG - O2 w exertion    Levy Pupa, MD, PhD 08/08/2011, 3:22 PM [facility] [phone] or if no answer [phone]

## 2011-08-08 NOTE — Assessment & Plan Note (Signed)
-   set up PSG - O2 w exertion

## 2011-08-25 ENCOUNTER — Other Ambulatory Visit: Payer: Self-pay

## 2011-08-25 ENCOUNTER — Emergency Department (HOSPITAL_COMMUNITY): Payer: Medicare Other

## 2011-08-25 ENCOUNTER — Encounter (HOSPITAL_COMMUNITY): Payer: Self-pay | Admitting: *Deleted

## 2011-08-25 ENCOUNTER — Emergency Department (HOSPITAL_COMMUNITY)
Admission: EM | Admit: 2011-08-25 | Discharge: 2011-08-26 | Disposition: A | Discharge status: Home / Self Care | Payer: Medicare Other | Attending: Emergency Medicine | Admitting: Emergency Medicine

## 2011-08-25 DIAGNOSIS — T148XXA Other injury of unspecified body region, initial encounter: Secondary | ICD-10-CM

## 2011-08-25 DIAGNOSIS — Z79899 Other long term (current) drug therapy: Secondary | ICD-10-CM | POA: Insufficient documentation

## 2011-08-25 DIAGNOSIS — Y9229 Other specified public building as the place of occurrence of the external cause: Secondary | ICD-10-CM | POA: Insufficient documentation

## 2011-08-25 DIAGNOSIS — I10 Essential (primary) hypertension: Secondary | ICD-10-CM | POA: Insufficient documentation

## 2011-08-25 DIAGNOSIS — W010XXA Fall on same level from slipping, tripping and stumbling without subsequent striking against object, initial encounter: Secondary | ICD-10-CM | POA: Insufficient documentation

## 2011-08-25 DIAGNOSIS — S0003XA Contusion of scalp, initial encounter: Secondary | ICD-10-CM | POA: Insufficient documentation

## 2011-08-25 DIAGNOSIS — Z7901 Long term (current) use of anticoagulants: Secondary | ICD-10-CM | POA: Insufficient documentation

## 2011-08-25 DIAGNOSIS — IMO0002 Reserved for concepts with insufficient information to code with codable children: Secondary | ICD-10-CM | POA: Insufficient documentation

## 2011-08-25 DIAGNOSIS — I4891 Unspecified atrial fibrillation: Secondary | ICD-10-CM | POA: Insufficient documentation

## 2011-08-25 DIAGNOSIS — W19XXXA Unspecified fall, initial encounter: Secondary | ICD-10-CM

## 2011-08-25 DIAGNOSIS — S1093XA Contusion of unspecified part of neck, initial encounter: Secondary | ICD-10-CM | POA: Insufficient documentation

## 2011-08-25 DIAGNOSIS — M542 Cervicalgia: Secondary | ICD-10-CM | POA: Insufficient documentation

## 2011-08-25 DIAGNOSIS — R220 Localized swelling, mass and lump, head: Secondary | ICD-10-CM | POA: Insufficient documentation

## 2011-08-25 DIAGNOSIS — R51 Headache: Secondary | ICD-10-CM | POA: Insufficient documentation

## 2011-08-25 DIAGNOSIS — I2510 Atherosclerotic heart disease of native coronary artery without angina pectoris: Secondary | ICD-10-CM | POA: Insufficient documentation

## 2011-08-25 NOTE — ED Provider Notes (Signed)
History     CSN: [id number]  Arrival date & time 08/25/11  1945   First MD Initiated Contact with Patient 08/25/11 2127      Chief Complaint  Patient presents with  . Fall    forehead knot and controlled bleeding,  nose controlled bleeding,  left knee pain,  denies chest pain,  fell on ramp at [employer].  denies LOC    (Consider location/radiation/quality/duration/timing/severity/associated sxs/prior treatment) HPI Comments: Patient reports that she tripped and fell at a store earlier today.  She landed on her face.  She has a bump on her forehead at this time.  No LOC.  She reports that she did not put her arms down to break her fall.  She denies any UE or LE pain.  She is currently on coumadin for afib.  She reports that her last INR was drawn 2 days ago, which was around 3.  She denies any vomiting or changes in vision.  Denies a headache, dizziness, or lightheadedness.   The history is provided by the patient.    Past Medical History  Diagnosis Date  . Atrial fibrillation   . CAD (coronary artery disease)   . Mitral valve disorder   . HTN (hypertension)   . Dyslipidemia   . Aortic stenosis, mild   . Breast cyst   . Hypertrophic cardiomyopathy     Past Surgical History  Procedure Date  . Laparoscopic cholecystectomy 2009  . Cataract extraction 2003    left  . Mitral valve replacement 2007  . Neck mass excision 2003  . Colonoscopy   . Broken wrist   . Broken ankle     History reviewed. No pertinent family history.  History  Substance Use Topics  . Smoking status: Former Smoker -- 1.0 packs/day for 40 years    Types: Cigarettes    Quit date: 05/21/1990  . Smokeless tobacco: Not on file  . Alcohol Use: No    OB History    Grav Para Term Preterm Abortions TAB SAB Ect Mult Living                  Review of Systems  Constitutional: Negative for fever and chills.  HENT: Negative for neck pain and neck stiffness.   Eyes: Negative for visual  disturbance.  Respiratory: Negative for shortness of breath.   Cardiovascular: Negative for chest pain.  Gastrointestinal: Negative for nausea, vomiting and abdominal pain.  Musculoskeletal: Negative for gait problem.  Skin: Positive for wound.  Neurological: Negative for dizziness, syncope, light-headedness and headaches.  Psychiatric/Behavioral: Negative for confusion.    Allergies  Ivp dye and Nitrofurantoin  Home Medications   Current Outpatient Rx  Name Route Sig Dispense Refill  . AMIODARONE HCL 200 MG PO TABS Oral Take 100 mg by mouth daily.    [name] CALCIUM 600-200 MG-UNIT PO TABS Oral Take 2 tablets by mouth daily.      [name] VITAMIN D 1000 UNITS PO TABS Oral Take 1,000 Units by mouth daily.      . COQ10 PO Oral Take 300 mg by mouth daily.    . OMEGA-3 FATTY ACIDS 1000 MG PO CAPS Oral Take 2 g by mouth daily.      [name] LANSOPRAZOLE 30 MG PO CPDR Oral Take 30 mg by mouth as needed.      [name] LEVOTHYROXINE SODIUM 25 MCG PO TABS Oral Take 25 mcg by mouth daily.    [name] LOSARTAN POTASSIUM 100 MG PO TABS Oral Take  1 tablet (100 mg total) by mouth daily. 30 tablet 6  . ONE-DAILY MULTI VITAMINS PO TABS Oral Take 1 tablet by mouth daily.      [name] ROSUVASTATIN CALCIUM 5 MG PO TABS Oral Take 5 mg by mouth daily.     . SERTRALINE HCL 25 MG PO TABS Oral Take 25 mg by mouth daily.      [name] VITAMIN C 500 MG PO TABS Oral Take 500 mg by mouth 2 (two) times daily.      . WARFARIN SODIUM 5 MG PO TABS Oral Take 2.5-5 mg by mouth See admin instructions. Take 1 tablet by mouth one Sun, Tues, Thur, Fri, Sat, and 0.5 tablets on Mon and Wed      BP 125/86  Pulse 68  Temp(Src) 99.2 F (37.3 C) (Oral)  Resp 20  SpO2 96%  Physical Exam  Nursing note and vitals reviewed. Constitutional: She appears well-developed and well-nourished. No distress.  HENT:  Head:    Eyes: EOM are normal. Pupils are equal, round, and reactive to light.  Neck: Normal range of motion. Neck supple.  Cardiovascular: Normal rate,  regular rhythm and normal heart sounds.   Pulmonary/Chest: Effort normal and breath sounds normal.  Abdominal: Soft. There is no tenderness.  Musculoskeletal: Normal range of motion. She exhibits no edema and no tenderness.       Right shoulder: She exhibits normal range of motion and no bony tenderness.       Left shoulder: She exhibits normal range of motion and no bony tenderness.       Right elbow: She exhibits normal range of motion. no tenderness found.       Left elbow: She exhibits normal range of motion. no tenderness found.       Right wrist: She exhibits normal range of motion and no bony tenderness.       Left wrist: She exhibits no bony tenderness and no deformity.       Right hip: She exhibits normal range of motion and no bony tenderness.       Left hip: She exhibits normal range of motion and no bony tenderness.       Right knee: She exhibits normal range of motion. no tenderness found.       Left knee: She exhibits normal range of motion. no tenderness found.  Neurological: She is alert. She has normal strength. No cranial nerve deficit or sensory deficit. Gait normal.  Skin: Skin is warm and dry. Abrasion noted. She is not diaphoretic.       Large hematoma with overlying abrasion of forehead  Psychiatric: She has a normal mood and affect.    ED Course  Procedures (including critical care time)   Labs Reviewed  APTT  PROTIME-INR   Ct Head Wo Contrast  08/25/2011  *RADIOLOGY REPORT*  Clinical Data:  Fall, left forehead hematoma, headache, neck pain  CT HEAD WITHOUT CONTRAST CT MAXILLOFACIAL WITHOUT CONTRAST CT CERVICAL SPINE WITHOUT CONTRAST  Technique:  Multidetector CT imaging of the head, cervical spine, and maxillofacial structures were performed using the standard protocol without intravenous contrast. Multiplanar CT image reconstructions of the cervical spine and maxillofacial structures were also generated.  Comparison:  None.  CT HEAD  Findings: No evidence of  parenchymal hemorrhage or extra-axial fluid collection. No mass lesion, mass effect, or midline shift.  No CT evidence of acute infarction.  Hypodensity in the right cerebellum (series 10/image 5), likely reflecting old infarct.  Subcortical white matter  and periventricular small vessel ischemic changes.  Global cortical atrophy.  No ventriculomegaly.  The visualized paranasal sinuses are essentially clear. The mastoid air cells are unopacified.  Extracranial hematoma overlying the left frontal bone (series 10/image 15).  No evidence of calvarial fracture.  IMPRESSION: Extracranial hematoma overlying the left frontal bone.  No evidence of calvarial fracture.  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  Global cortical atrophy with small vessel ischemic changes.  CT MAXILLOFACIAL  Findings:  Extracranial hematoma overlying the left frontal bone.  No evidence of maxillofacial fracture.  The nasal septum is mildly bowed to the right.  The visualized paranasal sinuses are essentially clear. The mastoid air cells are unopacified.  The bilateral orbits, including the retroconal soft tissues, are within normal limits.  IMPRESSION: No evidence of maxillofacial fracture.  CT CERVICAL SPINE  Findings:   Normal cervical lordosis.  No evidence of fracture or dislocation.  Vertebral body heights are maintained.  The dens appears intact.  No prevertebral soft tissue swelling.  Mild multilevel degenerative changes.  The visualized right thyroid gland is nodular/heterogeneous.  The visualized lung apices are notable for biapical pleural parenchymal scarring and mild paraseptal emphysematous changes.  IMPRESSION: No evidence of traumatic injury to the cervical spine.  Mild multilevel degenerative changes.  Original Report Authenticated By: Charline Bills, M.D.   Ct Cervical Spine Wo Contrast  08/25/2011  *RADIOLOGY REPORT*  Clinical Data:  Fall, left forehead hematoma, headache, neck pain  CT HEAD WITHOUT CONTRAST CT MAXILLOFACIAL  WITHOUT CONTRAST CT CERVICAL SPINE WITHOUT CONTRAST  Technique:  Multidetector CT imaging of the head, cervical spine, and maxillofacial structures were performed using the standard protocol without intravenous contrast. Multiplanar CT image reconstructions of the cervical spine and maxillofacial structures were also generated.  Comparison:  None.  CT HEAD  Findings: No evidence of parenchymal hemorrhage or extra-axial fluid collection. No mass lesion, mass effect, or midline shift.  No CT evidence of acute infarction.  Hypodensity in the right cerebellum (series 10/image 5), likely reflecting old infarct.  Subcortical white matter and periventricular small vessel ischemic changes.  Global cortical atrophy.  No ventriculomegaly.  The visualized paranasal sinuses are essentially clear. The mastoid air cells are unopacified.  Extracranial hematoma overlying the left frontal bone (series 10/image 15).  No evidence of calvarial fracture.  IMPRESSION: Extracranial hematoma overlying the left frontal bone.  No evidence of calvarial fracture.  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  Global cortical atrophy with small vessel ischemic changes.  CT MAXILLOFACIAL  Findings:  Extracranial hematoma overlying the left frontal bone.  No evidence of maxillofacial fracture.  The nasal septum is mildly bowed to the right.  The visualized paranasal sinuses are essentially clear. The mastoid air cells are unopacified.  The bilateral orbits, including the retroconal soft tissues, are within normal limits.  IMPRESSION: No evidence of maxillofacial fracture.  CT CERVICAL SPINE  Findings:   Normal cervical lordosis.  No evidence of fracture or dislocation.  Vertebral body heights are maintained.  The dens appears intact.  No prevertebral soft tissue swelling.  Mild multilevel degenerative changes.  The visualized right thyroid gland is nodular/heterogeneous.  The visualized lung apices are notable for biapical pleural parenchymal scarring  and mild paraseptal emphysematous changes.  IMPRESSION: No evidence of traumatic injury to the cervical spine.  Mild multilevel degenerative changes.  Original Report Authenticated By: Charline Bills, M.D.   Ct Maxillofacial Wo Cm  08/25/2011  *RADIOLOGY REPORT*  Clinical Data:  Fall, left forehead hematoma, headache, neck pain  CT HEAD WITHOUT CONTRAST CT MAXILLOFACIAL WITHOUT CONTRAST CT CERVICAL SPINE WITHOUT CONTRAST  Technique:  Multidetector CT imaging of the head, cervical spine, and maxillofacial structures were performed using the standard protocol without intravenous contrast. Multiplanar CT image reconstructions of the cervical spine and maxillofacial structures were also generated.  Comparison:  None.  CT HEAD  Findings: No evidence of parenchymal hemorrhage or extra-axial fluid collection. No mass lesion, mass effect, or midline shift.  No CT evidence of acute infarction.  Hypodensity in the right cerebellum (series 10/image 5), likely reflecting old infarct.  Subcortical white matter and periventricular small vessel ischemic changes.  Global cortical atrophy.  No ventriculomegaly.  The visualized paranasal sinuses are essentially clear. The mastoid air cells are unopacified.  Extracranial hematoma overlying the left frontal bone (series 10/image 15).  No evidence of calvarial fracture.  IMPRESSION: Extracranial hematoma overlying the left frontal bone.  No evidence of calvarial fracture.  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  Global cortical atrophy with small vessel ischemic changes.  CT MAXILLOFACIAL  Findings:  Extracranial hematoma overlying the left frontal bone.  No evidence of maxillofacial fracture.  The nasal septum is mildly bowed to the right.  The visualized paranasal sinuses are essentially clear. The mastoid air cells are unopacified.  The bilateral orbits, including the retroconal soft tissues, are within normal limits.  IMPRESSION: No evidence of maxillofacial fracture.  CT  CERVICAL SPINE  Findings:   Normal cervical lordosis.  No evidence of fracture or dislocation.  Vertebral body heights are maintained.  The dens appears intact.  No prevertebral soft tissue swelling.  Mild multilevel degenerative changes.  The visualized right thyroid gland is nodular/heterogeneous.  The visualized lung apices are notable for biapical pleural parenchymal scarring and mild paraseptal emphysematous changes.  IMPRESSION: No evidence of traumatic injury to the cervical spine.  Mild multilevel degenerative changes.  Original Report Authenticated By: Charline Bills, M.D.     1. Fall   2. Hematoma       MDM  Patient with mechanical fall and landed on face.  Patient currently on coumadin for afib.  CT head, CT neck, and CT maxillofacial were ordered, which was negative.  INR 2.29.  No bony tenderness to joints.  Therefore, feel that patient can be discharged home.        Pascal Lux [place], PA-C 08/27/11 1540

## 2011-08-25 NOTE — ED Notes (Signed)
MD at bedside. 

## 2011-08-25 NOTE — ED Notes (Signed)
Pt family at desk asking about patient wait, assured that patient would be taken to next available room and apologized for wait. Pt visible to triage window.

## 2011-08-26 LAB — PROTIME-INR: Prothrombin Time: 25.6 seconds — ABNORMAL HIGH (ref 11.6–15.2)

## 2011-08-26 LAB — APTT: aPTT: 37 seconds (ref 24–37)

## 2011-08-26 MED ORDER — BACITRACIN-NEOMYCIN-POLYMYXIN 400-5-5000 EX OINT
TOPICAL_OINTMENT | CUTANEOUS | Status: AC
  Administered 2011-08-26: 1 via TOPICAL
  Filled 2011-08-26: qty 1

## 2011-08-26 MED ORDER — BACITRACIN-NEOMYCIN-POLYMYXIN OINTMENT TUBE
TOPICAL_OINTMENT | Freq: Once | CUTANEOUS | Status: AC
  Administered 2011-08-26: 1 via TOPICAL

## 2011-08-29 NOTE — ED Provider Notes (Signed)
Medical screening examination/treatment/procedure(s) were conducted as a shared visit with non-physician practitioner(s) and myself.  I personally evaluated the patient during the encounter  Pt seen and evaluated- s/p fall.  CT scans reassuring.    Ethelda Chick, MD 08/29/11 0700

## 2011-08-30 ENCOUNTER — Ambulatory Visit (HOSPITAL_BASED_OUTPATIENT_CLINIC_OR_DEPARTMENT_OTHER): Payer: Medicare Other | Attending: Emergency Medicine | Admitting: Radiology

## 2011-08-30 VITALS — Ht 64.0 in | Wt 182.0 lb

## 2011-08-30 DIAGNOSIS — J4489 Other specified chronic obstructive pulmonary disease: Secondary | ICD-10-CM | POA: Insufficient documentation

## 2011-08-30 DIAGNOSIS — Z7901 Long term (current) use of anticoagulants: Secondary | ICD-10-CM | POA: Insufficient documentation

## 2011-08-30 DIAGNOSIS — I10 Essential (primary) hypertension: Secondary | ICD-10-CM | POA: Insufficient documentation

## 2011-08-30 DIAGNOSIS — IMO0002 Reserved for concepts with insufficient information to code with codable children: Secondary | ICD-10-CM

## 2011-08-30 DIAGNOSIS — Z79899 Other long term (current) drug therapy: Secondary | ICD-10-CM | POA: Insufficient documentation

## 2011-08-30 DIAGNOSIS — G4733 Obstructive sleep apnea (adult) (pediatric): Secondary | ICD-10-CM | POA: Insufficient documentation

## 2011-08-30 DIAGNOSIS — I4891 Unspecified atrial fibrillation: Secondary | ICD-10-CM | POA: Insufficient documentation

## 2011-08-30 DIAGNOSIS — I272 Pulmonary hypertension, unspecified: Secondary | ICD-10-CM

## 2011-08-30 DIAGNOSIS — I2789 Other specified pulmonary heart diseases: Secondary | ICD-10-CM | POA: Insufficient documentation

## 2011-08-30 DIAGNOSIS — J449 Chronic obstructive pulmonary disease, unspecified: Secondary | ICD-10-CM | POA: Insufficient documentation

## 2011-09-06 DIAGNOSIS — G4733 Obstructive sleep apnea (adult) (pediatric): Secondary | ICD-10-CM

## 2011-09-06 DIAGNOSIS — I10 Essential (primary) hypertension: Secondary | ICD-10-CM

## 2011-09-06 DIAGNOSIS — Z7901 Long term (current) use of anticoagulants: Secondary | ICD-10-CM

## 2011-09-06 DIAGNOSIS — Z79899 Other long term (current) drug therapy: Secondary | ICD-10-CM

## 2011-09-06 DIAGNOSIS — I2789 Other specified pulmonary heart diseases: Secondary | ICD-10-CM

## 2011-09-06 DIAGNOSIS — J449 Chronic obstructive pulmonary disease, unspecified: Secondary | ICD-10-CM

## 2011-09-06 DIAGNOSIS — I4891 Unspecified atrial fibrillation: Secondary | ICD-10-CM

## 2011-09-06 DIAGNOSIS — I509 Heart failure, unspecified: Secondary | ICD-10-CM

## 2011-09-06 NOTE — Procedures (Signed)
NAME:  Patricia [name], Patricia [name]                 ACCOUNT NO.:  [id number]  MEDICAL RECORD NO.:  [id number]          PATIENT TYPE:  OUT  LOCATION:  SLEEP CENTER                 FACILITY:  [facility]  PHYSICIAN:  Coralyn Helling, MD        DATE OF BIRTH:  1926-07-06  DATE OF STUDY:  08/30/2011                           NOCTURNAL POLYSOMNOGRAM  REFERRING PHYSICIAN:  Leslye Peer, MD  REFERRING PHYSICIAN:  Leslye Peer, MD.  INDICATION FOR STUDY:  [name] is an 78 year old female, who reports snoring, sleep disruption, and daytime sleepiness.  She also has a history of hypertension, COPD, atrial fibrillation, congestive heart failure, and secondary pulmonary hypertension.  She was found to have nocturnal oxygen desaturations.  She is referred to the sleep lab for evaluation of hypersomnia with obstructive sleep apnea.  Height is 5 feet 4 inches, weight is 182 pounds, BMI is 31, neck size is 15.5 inches.  EPWORTH SLEEPINESS SCORE:  Eight.  MEDICATIONS:  Sertraline, losartan, warfarin, Crestor, Synthroid, amiodarone, Centrum Silver, Citrucel, CoQ10, vitamin C, and fish oil.  SLEEP ARCHITECTURE:  Total recording time was 307 minutes.  Total sleep time was 280 minutes. Sleep efficiency was 74%. Sleep latency was 62 minutes. REM latency was 290 minutes.  The study was notable for the lack of slow-wave sleep and she slept predominately in the non-supine position.  RESPIRATORY DATA:  The average respiratory rate was 18.  Loud snoring was noted by the technician.  The overall apnea-hypopnea index was 18.9. There were 3 central apneic events.  The remainder of the events were obstructive in nature.  The supine apnea/hypopnea index was 26.9 and the non-supine apnea/hypopnea index was 7.1.  OXYGEN DATA:  The baseline oxygenation was 92%.  The oxygen saturation nadir was 85%.  The patient spent a total of 11.4 minutes with an oxygen saturation below 88%.  The study was conducted without the use  of supplemental oxygen.  CARDIAC DATA:  The average heart rate is 56 and the rhythm strip showed sinus rhythm with sinus bradycardia and occasional PVCs.  MOVEMENT-PARASOMNIA:  The periodic limb movement index was 71.4 and the patient had 1 restroom trip.  IMPRESSIONS-RECOMMENDATIONS:  This study shows evidence for moderate obstructive sleep apnea with an apnea/hypopnea index of 18.9 and an oxygen saturation nadir of 85%.  Of note is that she had a significant positional effect to her sleep-disordered breathing.  She had an increase in her periodic limb movement index and clinical correlation will be necessary to determine the significance of this.  In addition to diet, excise, and weight reduction, I would recommend that the patient return to the sleep lab for a CPAP titration study for further therapy of her obstructive sleep apnea.     Coralyn Helling, MD Diplomat, American Board of Sleep Medicine    VS/MEDQ  D:  09/05/2011 14:23:29  T:  09/06/2011 00:59:58  Job:  [id number]

## 2011-09-24 ENCOUNTER — Other Ambulatory Visit: Payer: Self-pay

## 2011-09-24 DIAGNOSIS — I10 Essential (primary) hypertension: Secondary | ICD-10-CM

## 2011-09-24 MED ORDER — LOSARTAN POTASSIUM 100 MG PO TABS: 100.0000 mg | ORAL_TABLET | Freq: Every day | ORAL | Status: DC

## 2011-10-02 ENCOUNTER — Other Ambulatory Visit: Payer: Self-pay | Admitting: Emergency Medicine

## 2011-10-02 DIAGNOSIS — IMO0002 Reserved for concepts with insufficient information to code with codable children: Secondary | ICD-10-CM

## 2011-10-11 ENCOUNTER — Encounter: Payer: Self-pay | Admitting: Emergency Medicine

## 2011-10-11 ENCOUNTER — Ambulatory Visit (INDEPENDENT_AMBULATORY_CARE_PROVIDER_SITE_OTHER): Payer: Medicare Other | Admitting: Emergency Medicine

## 2011-10-11 VITALS — BP 128/86 | HR 65 | Temp 98.3°F | Ht 64.0 in | Wt 191.0 lb

## 2011-10-11 DIAGNOSIS — J449 Chronic obstructive pulmonary disease, unspecified: Secondary | ICD-10-CM

## 2011-10-11 DIAGNOSIS — R0902 Hypoxemia: Secondary | ICD-10-CM

## 2011-10-11 DIAGNOSIS — I2789 Other specified pulmonary heart diseases: Secondary | ICD-10-CM

## 2011-10-11 DIAGNOSIS — IMO0002 Reserved for concepts with insufficient information to code with codable children: Secondary | ICD-10-CM

## 2011-10-11 NOTE — Assessment & Plan Note (Signed)
Continue to use xopenex prn

## 2011-10-11 NOTE — Assessment & Plan Note (Signed)
Treating underlying causes.  - CPAP titration as planned then start CPAP - underscored the improtance of wearing her O2 with exertion

## 2011-10-11 NOTE — Patient Instructions (Signed)
Please wear your oxygen with exertion Use your xopenex as needed Get your CPAP titration as planned and we will order your CPAP Follow with Dr Delton Coombes in 3 months

## 2011-10-11 NOTE — Progress Notes (Signed)
Subjective:    Patient ID: Patricia [name], female    DOB: 11/15/1926, 78 y.o.   MRN: [id number]  HPI Pleasant 78 yo woman followed by Drs Renne Crigler and Tenny Craw. She is a former smoker (40 pack-yrs) and has a history of mechanical MVR, atrial fibrillation, HTN with a hypertrophic LV and diastolic dysfunction w HOCM physiology. She has had some difficulty and functional decline in the past when either tachycardic or bradycardic, has been successfully managed with amiodarone.   Tells me that she has noticed slow progressive dyspnea over approximately 4-5 months, such that previously easy tasks like walking > 130m, shopping, have become difficult. She has needed to rest after about 136m, can't walk up hills. She has NOT heard wheeze, experienced CP or cough either at rest or with exertion. Her daughter has not heard any wheeze or upper airway noise. A review of Cardiology notes reveals that she has had SOB to a lesser degree farther back than Fall 2012 >> clonidine 0.1mg  was added to HTN regimen 5/25 but had to be stopped 6/8 due to symptomatic (dyspneic) bradycardia. She initially rebounded off the clonidine, but then experienced more SOB despite improvement in volume status and mild edema.    The subsequent evaluation included Stress TTE 12/14 showed low exercise tolerance but a small change in gradient w exertion:mLVOT gradient of at rest, w exertion; peakLVOT gradient at rest, to w exertion.  Unfortunately in late December she had an apparent URI that precipitated sx consistent with an AE-COPD that was treated with abx + prednisone (first and only time this has happened). She states that this illness dragged her down significantly, took several weeks for her to recover, experienced cough, dyspnea, malaise, decreased functional capacity.  PFT done 06/05/11 showed Mild AFL without BD response, normal volumes, decreased DLCO that corrects to normal range for Va.  She experienced a fall 1/17  with impact on her R chest, prompted a CT scan of the chest that showed some very mild basilar changes that could suggest interstitial process, but these are very subtle and could reflect atx or old mild scar. Based on her continued and now progressive dyspnea, and on coronary calcifications seen on chest CT, she had L and R heart cath on 06/26/11. I do not have the L heart data available but the patient tells me that her CAD has not progressed significantly compared with 2008 study. The R heart study showed no significant gradient across the AV, LVEDP 24, CO Fick 5.4 and Thermodil 4.0, mean PAOP between 20 - 26, RVP 53/5, PAP 50/20 (35). PVR 1.7 (Fick) and 2.3 (Thermodil).  She was evaluated by Dr Tenny Craw 2/18 and noted to be hypoxemic with ambulation.  ROV 08/08/11 -- Pleasant 78 yo woman w A fib, diastolic heart dz, MV disease and secondary PAH. 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 Last time we started O2 with exertion, trial xopenex. Plan to schedule PSG as a possible contributor to secondary PAH. BP was high today, 140/90. She has been using the O2 at home, but only on reserve when she is out of the house. She has used the xopenex prior to significant exertion, thinks it may have helped her some.   ROV 10/11/11 -- A fib, diastolic heart dz, MV disease and secondary PAH. 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 PSG showed OSA, CPAP titration ordered.  She is getting over a URI over the last 3 weeks. Overall she may feel better. She is wearing her  oxygen with some exertion, not all.      Objective:   Physical Exam  Filed Vitals:   10/11/11 1345  BP: 128/86  Pulse: 65  Temp: 98.3 F (36.8 C)   Gen: Pleasant, well-nourished, slightly overweight, mild kyphosis, in no distress,  normal affect  ENT: No lesions,  mouth clear,  oropharynx clear, no postnasal drip, Mal Class 4 airway  Neck: No JVD, no TMG, no carotid bruits  Lungs: No use of accessory muscles, no dullness to percussion, clear without rales or rhonchi, no  wheeze w normal breath or on forced exp  Cardiovascular: RRR, heart sounds normal, holosystolic M with intact mechanical S2, no LE edema  Musculoskeletal: No deformities, no cyanosis or clubbing, scar L wrist, intact pulses  Neuro: alert, non focal  Skin: Warm, no lesions or rashes, normal cap refill     Assessment & Plan:  Pulmonary hypertension, secondary Treating underlying causes.  - CPAP titration as planned then start CPAP - underscored the improtance of wearing her O2 with exertion  Exercise hypoxemia Encouraged her to wear O2 with exertion. Reviewed the times that she needs it  COPD, mild Continue to use xopenex prn    Levy Pupa, MD, PhD 10/11/2011, 2:10 PM [facility] [phone] or if no answer [phone]

## 2011-10-11 NOTE — Assessment & Plan Note (Signed)
Encouraged her to wear O2 with exertion. Reviewed the times that she needs it

## 2011-10-24 ENCOUNTER — Ambulatory Visit (HOSPITAL_BASED_OUTPATIENT_CLINIC_OR_DEPARTMENT_OTHER): Payer: Medicare Other | Attending: Emergency Medicine | Admitting: Radiology

## 2011-10-24 VITALS — Ht 64.0 in | Wt 187.0 lb

## 2011-10-24 DIAGNOSIS — IMO0002 Reserved for concepts with insufficient information to code with codable children: Secondary | ICD-10-CM

## 2011-10-24 DIAGNOSIS — Z79899 Other long term (current) drug therapy: Secondary | ICD-10-CM | POA: Insufficient documentation

## 2011-10-24 DIAGNOSIS — I509 Heart failure, unspecified: Secondary | ICD-10-CM | POA: Insufficient documentation

## 2011-10-24 DIAGNOSIS — Z7901 Long term (current) use of anticoagulants: Secondary | ICD-10-CM | POA: Insufficient documentation

## 2011-10-24 DIAGNOSIS — I2789 Other specified pulmonary heart diseases: Secondary | ICD-10-CM | POA: Insufficient documentation

## 2011-10-24 DIAGNOSIS — G4761 Periodic limb movement disorder: Secondary | ICD-10-CM | POA: Insufficient documentation

## 2011-10-24 DIAGNOSIS — I4891 Unspecified atrial fibrillation: Secondary | ICD-10-CM | POA: Insufficient documentation

## 2011-10-24 DIAGNOSIS — J4489 Other specified chronic obstructive pulmonary disease: Secondary | ICD-10-CM | POA: Insufficient documentation

## 2011-10-24 DIAGNOSIS — I10 Essential (primary) hypertension: Secondary | ICD-10-CM | POA: Insufficient documentation

## 2011-10-24 DIAGNOSIS — J449 Chronic obstructive pulmonary disease, unspecified: Secondary | ICD-10-CM | POA: Insufficient documentation

## 2011-10-24 DIAGNOSIS — G4733 Obstructive sleep apnea (adult) (pediatric): Secondary | ICD-10-CM | POA: Insufficient documentation

## 2011-11-06 ENCOUNTER — Telehealth: Payer: Self-pay | Admitting: Emergency Medicine

## 2011-11-06 DIAGNOSIS — G4733 Obstructive sleep apnea (adult) (pediatric): Secondary | ICD-10-CM | POA: Insufficient documentation

## 2011-11-06 DIAGNOSIS — J449 Chronic obstructive pulmonary disease, unspecified: Secondary | ICD-10-CM

## 2011-11-06 DIAGNOSIS — I10 Essential (primary) hypertension: Secondary | ICD-10-CM

## 2011-11-06 DIAGNOSIS — Z79899 Other long term (current) drug therapy: Secondary | ICD-10-CM

## 2011-11-06 DIAGNOSIS — Z7901 Long term (current) use of anticoagulants: Secondary | ICD-10-CM

## 2011-11-06 DIAGNOSIS — I2789 Other specified pulmonary heart diseases: Secondary | ICD-10-CM

## 2011-11-06 DIAGNOSIS — I4891 Unspecified atrial fibrillation: Secondary | ICD-10-CM

## 2011-11-06 NOTE — Telephone Encounter (Signed)
Pt returned call.  Advised of titration results/recs as stated by RB.  Pt ok with these recs and verbalized her understanding.  Order sent to [facility] for CPAP 15cm h20 heated humidity and best fit mask.  OSA added to problem list per RB's verbal authorization.  Nothing further needed, will sign off.

## 2011-11-06 NOTE — Telephone Encounter (Signed)
RB, please advise results, thanks

## 2011-11-06 NOTE — Telephone Encounter (Signed)
Left message with spouse TCB x1.

## 2011-11-06 NOTE — Procedures (Signed)
NAME:  Patricia [name], Patricia [name]                 ACCOUNT NO.:  [id number]  MEDICAL RECORD NO.:  [id number]          PATIENT TYPE:  OUT  LOCATION:  SLEEP CENTER                 FACILITY:  [facility]  PHYSICIAN:  Coralyn Helling, MD        DATE OF BIRTH:  1926-10-18  DATE OF STUDY:  10/24/2011                           NOCTURNAL POLYSOMNOGRAM  REFERRING PHYSICIAN:  Leslye Peer, MD  REFERRING PHYSICIAN:  Leslye Peer, MD  INDICATION:  [name] is an 77 year old female who has a history of hypertension, atrial fibrillation, congestive heart failure, COPD, and secondary pulmonary hypertension.  She was also found to have moderate obstructive sleep apnea after having a sleep study on August 30, 2011 which showed a apnea/hypopnea index of 18.9 and oxygen saturation nadir of 85%.  She returned to the sleep lab for a CPAP titration study. Height is 5 feet 4 inches, weight is 182 pounds, BMI is 32.  Neck size is 13.5 inches.  Medications are warfarin, Synthroid, losartan, sertraline, fish oil, coenzyme Q10, Crestor, amiodarone, vitamin D, and Centrum Silver.  Epworth score is 4.  SLEEP ARCHITECTURE:  Total recording time was 394 minutes.  Total sleep time was 192 minutes.  Sleep efficiency was 48%.  Sleep latency is 42 minutes.  The study was notable for the lack of slow-wave sleep and REM sleep and she slept predominantly in the supine position.  RESPIRATORY DATA:  The average respiratory rate was 18.  The patient was started on CPAP of 5 and increased to 15 cm water.  She was then transitioned to BiPAP starting at 18/14 and increased to 21/17.  With CPAP at 15 cm water, she appeared to have reasonable control of her sleep-disordered breathing and it appeared that at higher pressure settings with BiPAP, she had more difficulties with sleep disruption.  OXYGEN DATA:  The baseline oxygenation was 93%.  The oxygen saturation nadir was 86%.  The study was conducted without the use of  supplemental oxygen.  CARDIAC DATA:  The average heart rate was 56 and the rhythm strip showed sinus rhythm with frequent PVCs.  MOVEMENT/PARASOMNIA:  The periodic limb movement index was 39.1 and the patient had 1 restroom trip.  IMPRESSION:  The patient had reasonable control of her sleep-disordered breathing with CPAP of 15 cm water.  At this pressure setting, she was observed in supine sleep but not REM sleep.  She had an increase in her periodic limb movement index and clinical correlation would be necessary to determine the significance of this.  In addition to diet, exercise, and weight reduction, I would recommend that the patient be started on CPAP of 15 cm water and monitored for her clinical response.     Coralyn Helling, MD Diplomat, American Board of Sleep Medicine    VS/MEDQ  D:  11/06/2011 08:34:17  T:  11/06/2011 10:10:36  Job:  [id number]

## 2011-11-06 NOTE — Telephone Encounter (Signed)
Dr Craige Cotta just read - shows she needs CPAP 15 for starters. Also showed that she might have some leg movements that could be disturbing her sleep. We will need to look into this further if the CPAP mask isn't preventing her sleep disturbance. Please order CPAP 15cm H2O with heated humidity, best fit mask. Thanks.

## 2011-11-12 ENCOUNTER — Telehealth: Payer: Self-pay | Admitting: Emergency Medicine

## 2011-11-12 NOTE — Telephone Encounter (Signed)
I called [facility] and was on hold x 13 minutes wcb

## 2011-11-12 NOTE — Telephone Encounter (Signed)
Patient calling back again about cpap order.

## 2011-11-12 NOTE — Telephone Encounter (Signed)
Patient calling back about cpap order.

## 2011-11-12 NOTE — Telephone Encounter (Signed)
I spoke with pt and she states she is speaking with [facility] now and did not need anything from [place]. Will sign off message

## 2011-11-26 ENCOUNTER — Encounter: Payer: Self-pay | Admitting: Internal Medicine

## 2011-11-26 ENCOUNTER — Ambulatory Visit (INDEPENDENT_AMBULATORY_CARE_PROVIDER_SITE_OTHER): Payer: Medicare Other | Admitting: Internal Medicine

## 2011-11-26 ENCOUNTER — Other Ambulatory Visit: Payer: Self-pay | Admitting: Emergency Medicine

## 2011-11-26 ENCOUNTER — Other Ambulatory Visit: Payer: Self-pay | Admitting: *Deleted

## 2011-11-26 VITALS — BP 125/66 | HR 68 | Ht 64.0 in | Wt 193.0 lb

## 2011-11-26 DIAGNOSIS — D649 Anemia, unspecified: Secondary | ICD-10-CM

## 2011-11-26 DIAGNOSIS — R0602 Shortness of breath: Secondary | ICD-10-CM

## 2011-11-26 DIAGNOSIS — I4891 Unspecified atrial fibrillation: Secondary | ICD-10-CM

## 2011-11-26 DIAGNOSIS — G4730 Sleep apnea, unspecified: Secondary | ICD-10-CM

## 2011-11-26 LAB — BASIC METABOLIC PANEL
CO2: 27 mEq/L (ref 19–32)
Calcium: 8.4 mg/dL (ref 8.4–10.5)
Creatinine, Ser: 0.8 mg/dL (ref 0.4–1.2)
Glucose, Bld: 109 mg/dL — ABNORMAL HIGH (ref 70–99)

## 2011-11-26 LAB — CBC WITH DIFFERENTIAL/PLATELET
Basophils Absolute: 0 10*3/uL (ref 0.0–0.1)
Eosinophils Absolute: 0.2 10*3/uL (ref 0.0–0.7)
Lymphocytes Relative: 7.8 % — ABNORMAL LOW (ref 12.0–46.0)
MCHC: 32.2 g/dL (ref 30.0–36.0)
Neutrophils Relative %: 83.1 % — ABNORMAL HIGH (ref 43.0–77.0)
RBC: 3.86 Mil/uL — ABNORMAL LOW (ref 3.87–5.11)
RDW: 17.1 % — ABNORMAL HIGH (ref 11.5–14.6)

## 2011-11-26 LAB — BRAIN NATRIURETIC PEPTIDE: Pro B Natriuretic peptide (BNP): 836 pg/mL — ABNORMAL HIGH (ref 0.0–100.0)

## 2011-11-26 MED ORDER — FUROSEMIDE 40 MG PO TABS: 40.0000 mg | ORAL_TABLET | Freq: Once | ORAL | Status: DC

## 2011-11-26 NOTE — Progress Notes (Signed)
HPIPatient is a 78 year old with a history of HOCM, very mild CAD  hypertension, PAF, dyslipidemia. Limited stress echo showed no signif increase in LVOT gradient. She has had continued SOB CT of chest showed no fibrosis. R and L heart cath. showed no critical CAD, MR through mitral prosthesis of uncertain severity. PA pressures were increased with RA pressure of 53/10 and PCWP 24   She was last in cardiol clinic in Feb  She has been seen by R Byrum in pulmonary since.  She is on oxygen with exertion and CPAP.  She comes back today for f/u.  Her granddaughter Alcario Drought is with her.  They note that she has been more SOB with activity.  Recent trip to beach with difficulties getting around.  More fatigued.   More DOE Allergies  Allergen Reactions  . Ivp Dye (Iodinated Diagnostic Agents)   . Nitrofurantoin     Current Outpatient Prescriptions  Medication Sig Dispense Refill  . amiodarone (PACERONE) 200 MG tablet Take 100 mg by mouth daily.      . Calcium 600-200 MG-UNIT per tablet Take 2 tablets by mouth daily.        . cholecalciferol (VITAMIN D) 1000 UNITS tablet Take 1,000 Units by mouth daily.        . Coenzyme Q10 (COQ10 PO) Take 300 mg by mouth daily.      . fish oil-omega-3 fatty acids 1000 MG capsule Take 2 g by mouth daily.        . lansoprazole (PREVACID) 30 MG capsule Take 30 mg by mouth as needed.        [name] levothyroxine (SYNTHROID, LEVOTHROID) 125 MCG tablet Take 62 mcg by mouth daily.      [name] losartan (COZAAR) 100 MG tablet Take 1 tablet (100 mg total) by mouth daily.  30 tablet  6  . Multiple Vitamin (MULTIVITAMIN) tablet Take 1 tablet by mouth daily.        . rosuvastatin (CRESTOR) 5 MG tablet Take 5 mg by mouth daily.       . sertraline (ZOLOFT) 50 MG tablet Take 50 mg by mouth 2 (two) times daily.      [name] warfarin (COUMADIN) 5 MG tablet Take 2.5-5 mg by mouth See admin instructions. Take 1 tablet by mouth one Sun, Tues, Thur, Fri, Sat, and 0.5 tablets on Mon and Wed        Past  Medical History  Diagnosis Date  . Atrial fibrillation   . CAD (coronary artery disease)   . Mitral valve disorder   . HTN (hypertension)   . Dyslipidemia   . Aortic stenosis, mild   . Breast cyst   . Hypertrophic cardiomyopathy     Past Surgical History  Procedure Date  . Laparoscopic cholecystectomy 2009  . Cataract extraction 2003    left  . Mitral valve replacement 2007  . Neck mass excision 2003  . Colonoscopy   . Broken wrist   . Broken ankle     No family history on file.  History   Social History  . Marital Status: Married    Spouse Name: N/A    Number of Children: N/A  . Years of Education: N/A   Occupational History  . Not on file.   Social History Main Topics  . Smoking status: Former Smoker -- 1.0 packs/day for 40 years    Types: Cigarettes    Quit date: 05/21/1990  . Smokeless tobacco: Not on file  . Alcohol Use: No  .  Drug Use: No  . Sexually Active: Not on file   Other Topics Concern  . Not on file   Social History Narrative  . No narrative on file    Review of Systems:  All systems reviewed.  They are negative to the above problem except as previously stated.  Vital Signs: BP 125/66  Pulse 68  Ht 5\' 4"  (1.626 m)  Wt 193 lb (87.544 kg)  BMI 33.13 kg/m2  Physical Exam Patient is in NAD at rest. HEENT:  Normocephalic, atraumatic. EOMI, PERRLA.  Neck: JVP is normal.  Lungs: clear to auscultation. No rales no wheezes.  Heart: Regular rate and rhythm. Normal S1, S2. No S3.  Gr III/VI systolic murmur at base.  Abdomen:  Supple, nontender. Normal bowel sounds. No masses. No hepatomegaly.  Extremities:   Good distal pulses throughout. No lower extremity edema.  Musculoskeletal :moving all extremities.  Neuro:   alert and oriented x3.  CN II-XII grossly intact.  EKG:  SR 71.  LAFB.  Q 2aves V1, V2.    Assessment and Plan:  1.  SOB.  Clinically with worsening. Volume status does not look bad.  Not using oxygen all the time.   Will  check CBC, BNP, BMET. Discussed with R Byrum.  Will schedule O2 sat study at night.  2.  HOCM.  Again volume does not appear bad on exam.  Remains in SR.    3.  Afib.  I would continue current meds.  Check CBC since on coumadin.  Remains in SR.  4.  MR.  FOllow.  5.  HL  Keep on statin.  6.  CAD  Mild on cath.

## 2011-11-26 NOTE — Patient Instructions (Signed)
Lab work today We will call you with results. 

## 2011-11-28 ENCOUNTER — Other Ambulatory Visit (INDEPENDENT_AMBULATORY_CARE_PROVIDER_SITE_OTHER): Payer: Medicare Other

## 2011-11-28 DIAGNOSIS — Z79899 Other long term (current) drug therapy: Secondary | ICD-10-CM

## 2011-11-28 DIAGNOSIS — D649 Anemia, unspecified: Secondary | ICD-10-CM

## 2011-11-28 LAB — IRON AND TIBC
Iron: 27 ug/dL — ABNORMAL LOW (ref 42–145)
UIBC: 396 ug/dL (ref 125–400)

## 2011-12-03 ENCOUNTER — Telehealth: Payer: Self-pay | Admitting: Internal Medicine

## 2011-12-03 DIAGNOSIS — D649 Anemia, unspecified: Secondary | ICD-10-CM

## 2011-12-03 MED ORDER — FERROUS SULFATE 325 (65 FE) MG PO TABS: 325.0000 mg | ORAL_TABLET | Freq: Two times a day (BID) | ORAL | Status: DC

## 2011-12-03 NOTE — Telephone Encounter (Signed)
Pt needs to check on K+ it still is not called in

## 2011-12-03 NOTE — Telephone Encounter (Signed)
Patient does not need potassium sent she needs FESO4. Advised will send to pharmacy.

## 2011-12-14 ENCOUNTER — Other Ambulatory Visit: Payer: Medicare Other

## 2011-12-24 ENCOUNTER — Telehealth: Payer: Self-pay | Admitting: Internal Medicine

## 2011-12-24 NOTE — Telephone Encounter (Signed)
Called patient back. She has advised me that she needs to " swallow the camera" for a GI study for 8/12 and has to hold IRON for 1 week. Due for a CBC on 8/7 and wanted to know if she should wait longer to complete the CBC. Advised will ask Dr.Ross and call her back.

## 2011-12-24 NOTE — Telephone Encounter (Signed)
Pt calling re question on  medication °

## 2011-12-25 NOTE — Telephone Encounter (Signed)
Pt notified of need for CBC, scheduled for 12/27/2011 at 9:35.  Pt agrees to plan.

## 2011-12-25 NOTE — Telephone Encounter (Signed)
Patient should have CBC done to make sure not dropping further.

## 2011-12-27 ENCOUNTER — Other Ambulatory Visit (INDEPENDENT_AMBULATORY_CARE_PROVIDER_SITE_OTHER): Payer: Medicare Other

## 2011-12-27 DIAGNOSIS — D649 Anemia, unspecified: Secondary | ICD-10-CM

## 2011-12-27 LAB — CBC WITH DIFFERENTIAL/PLATELET
Basophils Absolute: 0 10*3/uL (ref 0.0–0.1)
Eosinophils Absolute: 0.2 10*3/uL (ref 0.0–0.7)
HCT: 37.5 % (ref 36.0–46.0)
Hemoglobin: 11.9 g/dL — ABNORMAL LOW (ref 12.0–15.0)
Lymphs Abs: 0.6 10*3/uL — ABNORMAL LOW (ref 0.7–4.0)
MCHC: 31.7 g/dL (ref 30.0–36.0)
Neutro Abs: 5.4 10*3/uL (ref 1.4–7.7)
RDW: 23 % — ABNORMAL HIGH (ref 11.5–14.6)

## 2012-01-10 ENCOUNTER — Ambulatory Visit (INDEPENDENT_AMBULATORY_CARE_PROVIDER_SITE_OTHER): Payer: Medicare Other | Admitting: Emergency Medicine

## 2012-01-10 ENCOUNTER — Encounter: Payer: Self-pay | Admitting: Emergency Medicine

## 2012-01-10 VITALS — BP 118/70 | HR 71 | Temp 99.1°F | Ht 64.0 in | Wt 189.8 lb

## 2012-01-10 DIAGNOSIS — IMO0002 Reserved for concepts with insufficient information to code with codable children: Secondary | ICD-10-CM

## 2012-01-10 DIAGNOSIS — G4733 Obstructive sleep apnea (adult) (pediatric): Secondary | ICD-10-CM

## 2012-01-10 DIAGNOSIS — I2789 Other specified pulmonary heart diseases: Secondary | ICD-10-CM

## 2012-01-10 NOTE — Patient Instructions (Addendum)
Please continue to use your oxygen with exertion Wear your CPAP every night. It is set on 15 cmH2O.  Follow with Dr Delton Coombes in 6 months or sooner if you have any problems

## 2012-01-10 NOTE — Assessment & Plan Note (Signed)
Continue CPAP 15

## 2012-01-10 NOTE — Assessment & Plan Note (Addendum)
Encouraged her to use her o2 with exertion Her underlying cardiac issues are under good control by Dr Tenny Craw, Dr Renne Crigler is following her anemia.

## 2012-01-10 NOTE — Progress Notes (Signed)
Subjective:    Patient ID: Patricia [name], female    DOB: 1927-02-26, 78 y.o.   MRN: [id number]  HPI Pleasant 78 yo woman followed by Drs Renne Crigler and Tenny Craw. She is a former smoker (40 pack-yrs) and has a history of mechanical MVR, atrial fibrillation, HTN with a hypertrophic LV and diastolic dysfunction w HOCM physiology. She has had some difficulty and functional decline in the past when either tachycardic or bradycardic, has been successfully managed with amiodarone.   Tells me that she has noticed slow progressive dyspnea over approximately 4-5 months, such that previously easy tasks like walking > 18m, shopping, have become difficult. She has needed to rest after about 186m, can't walk up hills. She has NOT heard wheeze, experienced CP or cough either at rest or with exertion. Her daughter has not heard any wheeze or upper airway noise. A review of Cardiology notes reveals that she has had SOB to a lesser degree farther back than Fall 2012 >> clonidine 0.1mg  was added to HTN regimen 5/25 but had to be stopped 6/8 due to symptomatic (dyspneic) bradycardia. She initially rebounded off the clonidine, but then experienced more SOB despite improvement in volume status and mild edema.    The subsequent evaluation included Stress TTE 12/14 showed low exercise tolerance but a small change in gradient w exertion:mLVOT gradient of at rest, w exertion; peakLVOT gradient at rest, to w exertion.  Unfortunately in late December she had an apparent URI that precipitated sx consistent with an AE-COPD that was treated with abx + prednisone (first and only time this has happened). She states that this illness dragged her down significantly, took several weeks for her to recover, experienced cough, dyspnea, malaise, decreased functional capacity.  PFT done 06/05/11 showed Mild AFL without BD response, normal volumes, decreased DLCO that corrects to normal range for Va.  She experienced a fall 1/17  with impact on her R chest, prompted a CT scan of the chest that showed some very mild basilar changes that could suggest interstitial process, but these are very subtle and could reflect atx or old mild scar. Based on her continued and now progressive dyspnea, and on coronary calcifications seen on chest CT, she had L and R heart cath on 06/26/11. I do not have the L heart data available but the patient tells me that her CAD has not progressed significantly compared with 2008 study. The R heart study showed no significant gradient across the AV, LVEDP 24, CO Fick 5.4 and Thermodil 4.0, mean PAOP between 20 - 26, RVP 53/5, PAP 50/20 (35). PVR 1.7 (Fick) and 2.3 (Thermodil).  She was evaluated by Dr Tenny Craw 2/18 and noted to be hypoxemic with ambulation.  ROV 08/08/11 -- Pleasant 78 yo woman w A fib, diastolic heart dz, MV disease and secondary PAH. 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 Last time we started O2 with exertion, trial xopenex. Plan to schedule PSG as a possible contributor to secondary PAH. BP was high today, 140/90. She has been using the O2 at home, but only on reserve when she is out of the house. She has used the xopenex prior to significant exertion, thinks it may have helped her some.   ROV 10/11/11 -- A fib, diastolic heart dz, MV disease and secondary PAH. 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 PSG showed OSA, CPAP titration ordered.  She is getting over a URI over the last 3 weeks. Overall she may feel better. She is wearing her  oxygen with some exertion, not all.   ROV 01/10/12 -- A fib, diastolic heart dz, MV disease and secondary PAH. 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 PSG showed OSA > started CPAP and believes she sleeps better. She feels that her breathing is better since last time. Not really sure what has changed  - she didn't notice a difference on the SABA. Anemia workup has shown no source GI bleeding. She is having some vision changes - blurred vision when she gets up in the am.      Objective:    Physical Exam Filed Vitals:   01/10/12 1436  BP: 118/70  Pulse: 71  Temp: 99.1 F (37.3 C)   Gen: Pleasant, well-nourished, slightly overweight, mild kyphosis, in no distress,  normal affect  ENT: No lesions,  mouth clear,  oropharynx clear, no postnasal drip, Mal Class 4 airway  Neck: No JVD, no TMG, no carotid bruits  Lungs: No use of accessory muscles, no dullness to percussion, clear without rales or rhonchi, no wheeze w normal breath or on forced exp  Cardiovascular: RRR, heart sounds normal, holosystolic M with intact mechanical S2, no LE edema  Musculoskeletal: No deformities, no cyanosis or clubbing, scar L wrist, intact pulses  Neuro: alert, non focal  Skin: Warm, no lesions or rashes, normal cap refill     Assessment & Plan:  OSA (obstructive sleep apnea) Continue CPAP 15  Pulmonary hypertension, secondary Encouraged her to use her o2 with exertion Her underlying cardiac issues are under good control by Dr Tenny Craw, Dr Renne Crigler is following her anemia.    Levy Pupa, MD, PhD 01/10/2012, 3:14 PM [facility] [phone] or if no answer [phone]

## 2012-01-17 ENCOUNTER — Ambulatory Visit (INDEPENDENT_AMBULATORY_CARE_PROVIDER_SITE_OTHER): Payer: Medicare Other | Admitting: Internal Medicine

## 2012-01-17 ENCOUNTER — Encounter: Payer: Self-pay | Admitting: Internal Medicine

## 2012-01-17 ENCOUNTER — Encounter: Payer: Self-pay | Admitting: *Deleted

## 2012-01-17 VITALS — BP 138/69 | HR 63 | Ht 64.0 in | Wt 188.0 lb

## 2012-01-17 DIAGNOSIS — Z79899 Other long term (current) drug therapy: Secondary | ICD-10-CM

## 2012-01-17 DIAGNOSIS — R0602 Shortness of breath: Secondary | ICD-10-CM

## 2012-01-17 DIAGNOSIS — I4891 Unspecified atrial fibrillation: Secondary | ICD-10-CM

## 2012-01-17 DIAGNOSIS — D649 Anemia, unspecified: Secondary | ICD-10-CM

## 2012-01-17 LAB — FERRITIN: Ferritin: 57.3 ng/mL (ref 10.0–291.0)

## 2012-01-17 LAB — VITAMIN B12: Vitamin B-12: 274 pg/mL (ref 211–911)

## 2012-01-17 LAB — BRAIN NATRIURETIC PEPTIDE: Pro B Natriuretic peptide (BNP): 566 pg/mL — ABNORMAL HIGH (ref 0.0–100.0)

## 2012-01-17 LAB — BASIC METABOLIC PANEL
BUN: 16 mg/dL (ref 6–23)
CO2: 27 mEq/L (ref 19–32)
Calcium: 8.9 mg/dL (ref 8.4–10.5)
Chloride: 105 mEq/L (ref 96–112)
Creatinine, Ser: 0.8 mg/dL (ref 0.4–1.2)
GFR: 75.02 mL/min (ref >60.00)
Glucose, Bld: 89 mg/dL (ref 70–99)
Potassium: 3.6 mEq/L (ref 3.5–5.1)
Sodium: 139 mEq/L (ref 135–145)

## 2012-01-17 LAB — FOLATE: Folate: >24.8 ng/mL (ref >5.9)

## 2012-01-17 LAB — CBC WITH DIFFERENTIAL/PLATELET
Basophils Absolute: 0 10*3/uL (ref 0.0–0.1)
HCT: 40.2 % (ref 36.0–46.0)
Lymphocytes Relative: 10 % — ABNORMAL LOW (ref 12.0–46.0)
Lymphs Abs: 0.7 10*3/uL (ref 0.7–4.0)
Monocytes Relative: 6.7 % (ref 3.0–12.0)
Platelets: 123 10*3/uL — ABNORMAL LOW (ref 150.0–400.0)
RDW: 21.9 % — ABNORMAL HIGH (ref 11.5–14.6)

## 2012-01-17 LAB — IBC PANEL
Iron: 61 ug/dL (ref 42–145)
Saturation Ratios: 15.6 % — ABNORMAL LOW (ref 20.0–50.0)
Transferrin: 280 mg/dL (ref 212.0–360.0)

## 2012-01-17 NOTE — Progress Notes (Addendum)
HPI Dietrich Pates, MD 12/06/2011 6:35 AM Signed  HPI Patient is a 78 year old with a history of HOCM, very mild CAD hypertension, PAF, dyslipidemia. Limited stress echo showed no signif increase in LVOT gradient. She has had continued SOB CT of chest showed no fibrosis. R and L heart cath. showed no critical CAD, MR through mitral prosthesis of uncertain severity. PA pressures were increased with RA pressure of 53/10 and PCWP 24  I last saw Ms. Biswell in clinic in July.   Since seen she is doing some better but still gives out.   She has been seen by R. Byrum  She is using oxygen more. She has also been seen by Dr Elnoria Howard.  Hemorrhoids seen o  Otherwise negative. SHe is back on Coumadin per Dr. Renne Crigler.  Followed there for her coumadin. Still concerned about mitral valve  Is this leading to SOB. She admits breathing is better than earlier this summer.  But not what it was 1 year ago.  Denies PND  No orthopnea. No CP   Allergies  Allergen Reactions  . Ivp Dye (Iodinated Diagnostic Agents)   . Nitrofurantoin     Current Outpatient Prescriptions  Medication Sig Dispense Refill  . amiodarone (PACERONE) 200 MG tablet Take 100 mg by mouth daily.      . Calcium 600-200 MG-UNIT per tablet Take 1 tablet by mouth daily.       . cholecalciferol (VITAMIN D) 1000 UNITS tablet Take 1,000 Units by mouth daily.        . Coenzyme Q10 (COQ10 PO) Take 300 mg by mouth daily.      . ferrous sulfate 325 (65 FE) MG tablet Take 325 mg by mouth daily with breakfast.      . fish oil-omega-3 fatty acids 1000 MG capsule Take 1 g by mouth daily.       [name] levothyroxine (SYNTHROID, LEVOTHROID) 125 MCG tablet Take 62 mcg by mouth daily.      [name] losartan (COZAAR) 100 MG tablet Take 1 tablet (100 mg total) by mouth daily.  30 tablet  6  . rosuvastatin (CRESTOR) 5 MG tablet 1/2 tab daily      . sertraline (ZOLOFT) 100 MG tablet Take 100 mg by mouth daily.      [name] warfarin (COUMADIN) 5 MG tablet Take 2.5-5 mg by mouth See admin  instructions. Take 1 tablet by mouth one Sun, Tues, Thur, Fri, Sat, and 0.5 tablets on Mon and Wed      . DISCONTD: ferrous sulfate 325 (65 FE) MG tablet Take 1 tablet (325 mg total) by mouth 2 (two) times daily.  60 tablet  6    Past Medical History  Diagnosis Date  . Atrial fibrillation   . CAD (coronary artery disease)   . Mitral valve disorder   . HTN (hypertension)   . Dyslipidemia   . Aortic stenosis, mild   . Breast cyst   . Hypertrophic cardiomyopathy     Past Surgical History  Procedure Date  . Laparoscopic cholecystectomy 2009  . Cataract extraction 2003    left  . Mitral valve replacement 2007  . Neck mass excision 2003  . Colonoscopy   . Broken wrist   . Broken ankle     No family history on file.  History   Social History  . Marital Status: Married    Spouse Name: N/A    Number of Children: N/A  . Years of Education: N/A   Occupational History  .  Not on file.   Social History Main Topics  . Smoking status: Former Smoker -- 1.0 packs/day for 40 years    Types: Cigarettes    Quit date: 05/21/1990  . Smokeless tobacco: Not on file  . Alcohol Use: No  . Drug Use: No  . Sexually Active: Not on file   Other Topics Concern  . Not on file   Social History Narrative  . No narrative on file    Review of Systems:  All systems reviewed.  They are negative to the above problem except as previously stated.  Vital Signs: BP 138/69  Pulse 63  Ht 5\' 4"  (1.626 m)  Wt 188 lb (85.276 kg)  BMI 32.27 kg/m2  Physical Exam Patient is in NAD HEENT:  Normocephalic, atraumatic. EOMI, PERRLA.  Neck: JVP is normal.  No bruits.  Lungs: clear to auscultation. No rales no wheezes.  Heart: Regular rate and rhythm. Normal S1, S2. No S3.   GR III/VI systolic murmur at RUSP.  Gr I/VI systolic murmur at apex. PMI not displaced.  Abdomen:  Supple, nontender. Normal bowel sounds. No masses. No hepatomegaly.  Extremities:   Good distal pulses throughout. Tr  lower  extremity edema. Varicose veins. Musculoskeletal :moving all extremities.  Neuro:   alert and oriented x3.  CN II-XII grossly intact.  EKG: SR 63  First degree AV block (PR )  LVH  Occasional PVC Assessment and Plan:  1,DYspnea.  Patient has had extensive work up.  She has signif LVH with HCM.  Diastolic dysfunction.  On R heart cath PCWP was increased.  Need to be careful with volume though given myoopathy. Does have MR but I am not convinced this is explaining. Rec:  Labs.  Diurese depending on values.   Have discussed TEE with patient to evaluate MR  She would like to proceed.  WIll schedule. E  2.  Hypertrophic Cardiomyopathy.  Volume status may be increased some.    3.  Afib.  Remains in SR.  On Amio  Back on coumadin which I was unaware happened.  GI work up was revealing for hemorrhoids.  Will need to follow Hgb closely  Would recomm monthly CBCs when INR is checked at Dr Carolee Rota office.  With her anatomy she will not tolerate signif anemia  4.  CAD  Not convinced of angina  5.  HL  Continue statin.

## 2012-01-17 NOTE — Patient Instructions (Addendum)
Lab work today Will call you with results.  TEE scheduled for 01/31/2012 with Dr.Ross  Check in at [facility] at 10 am for an 11 am procedure.

## 2012-01-18 ENCOUNTER — Other Ambulatory Visit: Payer: Self-pay | Admitting: *Deleted

## 2012-01-18 MED ORDER — FUROSEMIDE 20 MG PO TABS: ORAL_TABLET | ORAL | Status: DC

## 2012-01-28 ENCOUNTER — Encounter: Payer: Self-pay | Admitting: Internal Medicine

## 2012-01-28 ENCOUNTER — Encounter: Payer: Self-pay | Admitting: Emergency Medicine

## 2012-01-29 ENCOUNTER — Encounter: Payer: Self-pay | Admitting: Internal Medicine

## 2012-01-31 ENCOUNTER — Encounter (HOSPITAL_COMMUNITY)
Admission: RE | Disposition: A | Discharge status: Home / Self Care | Payer: Self-pay | Source: Ambulatory Visit | Attending: Internal Medicine

## 2012-01-31 ENCOUNTER — Ambulatory Visit (HOSPITAL_COMMUNITY)
Admission: RE | Admit: 2012-01-31 | Discharge: 2012-01-31 | Disposition: A | Discharge status: Home / Self Care | Payer: Medicare Other | Source: Ambulatory Visit | Attending: Internal Medicine | Admitting: Internal Medicine

## 2012-01-31 ENCOUNTER — Encounter (HOSPITAL_COMMUNITY): Payer: Self-pay | Admitting: *Deleted

## 2012-01-31 DIAGNOSIS — I059 Rheumatic mitral valve disease, unspecified: Secondary | ICD-10-CM

## 2012-01-31 DIAGNOSIS — I10 Essential (primary) hypertension: Secondary | ICD-10-CM | POA: Insufficient documentation

## 2012-01-31 DIAGNOSIS — R0600 Dyspnea, unspecified: Secondary | ICD-10-CM

## 2012-01-31 HISTORY — DX: Chronic obstructive pulmonary disease, unspecified: J44.9

## 2012-01-31 HISTORY — DX: Hypothyroidism, unspecified: E03.9

## 2012-01-31 HISTORY — DX: Anemia, unspecified: D64.9

## 2012-01-31 HISTORY — DX: Sleep apnea, unspecified: G47.30

## 2012-01-31 HISTORY — PX: TEE WITHOUT CARDIOVERSION: SHX5443

## 2012-01-31 SURGERY — ECHOCARDIOGRAM, TRANSESOPHAGEAL
Anesthesia: Moderate Sedation

## 2012-01-31 MED ORDER — SODIUM CHLORIDE 0.9 % IV SOLN
INTRAVENOUS | Status: DC
  Administered 2012-01-31: 11:00:00 via INTRAVENOUS

## 2012-01-31 MED ORDER — LIDOCAINE VISCOUS 2 % MT SOLN
OROMUCOSAL | Status: AC
  Filled 2012-01-31: qty 15

## 2012-01-31 MED ORDER — LIDOCAINE VISCOUS 2 % MT SOLN
OROMUCOSAL | Status: DC | PRN
  Administered 2012-01-31: 10 mL via OROMUCOSAL

## 2012-01-31 MED ORDER — MIDAZOLAM HCL 10 MG/2ML IJ SOLN
INTRAMUSCULAR | Status: DC | PRN
  Administered 2012-01-31 (×2): 1 mg via INTRAVENOUS
  Administered 2012-01-31: 2 mg via INTRAVENOUS
  Administered 2012-01-31: 1 mg via INTRAVENOUS

## 2012-01-31 MED ORDER — FENTANYL CITRATE 0.05 MG/ML IJ SOLN
INTRAMUSCULAR | Status: AC
  Filled 2012-01-31: qty 2

## 2012-01-31 MED ORDER — FENTANYL CITRATE 0.05 MG/ML IJ SOLN
INTRAMUSCULAR | Status: DC | PRN
  Administered 2012-01-31: 12.5 ug via INTRAVENOUS
  Administered 2012-01-31: 25 ug via INTRAVENOUS
  Administered 2012-01-31: 12.5 ug via INTRAVENOUS

## 2012-01-31 MED ORDER — MIDAZOLAM HCL 5 MG/ML IJ SOLN
INTRAMUSCULAR | Status: AC
  Filled 2012-01-31: qty 2

## 2012-01-31 NOTE — H&P (View-Only) (Signed)
HPI Patricia Luepke, MD 12/06/2011 6:35 AM Signed  HPI Patient is a 78 year old with a history of HOCM, very mild CAD hypertension, PAF, dyslipidemia. Limited stress echo showed no signif increase in LVOT gradient. She has had continued SOB CT of chest showed no fibrosis. R and L heart cath. showed no critical CAD, MR through mitral prosthesis of uncertain severity. PA pressures were increased with RA pressure of 53/10 and PCWP 24  I last saw Patricia [name] in clinic in July.   Since seen she is doing some better but still gives out.   She has been seen by R. Byrum  She is using oxygen more. She has also been seen by Dr Hung.  Hemorrhoids seen o  Otherwise negative. SHe is back on Coumadin per Dr. Pharr.  Followed there for her coumadin. Still concerned about mitral valve  Is this leading to SOB. She admits breathing is better than earlier this summer.  But not what it was 1 year ago.  Denies PND  No orthopnea. No CP   Allergies  Allergen Reactions  . Ivp Dye (Iodinated Diagnostic Agents)   . Nitrofurantoin     Current Outpatient Prescriptions  Medication Sig Dispense Refill  . amiodarone (PACERONE) 200 MG tablet Take 100 mg by mouth daily.      . Calcium 600-200 MG-UNIT per tablet Take 1 tablet by mouth daily.       . cholecalciferol (VITAMIN D) 1000 UNITS tablet Take 1,000 Units by mouth daily.        . Coenzyme Q10 (COQ10 PO) Take 300 mg by mouth daily.      . ferrous sulfate 325 (65 FE) MG tablet Take 325 mg by mouth daily with breakfast.      . fish oil-omega-3 fatty acids 1000 MG capsule Take 1 g by mouth daily.       . levothyroxine (SYNTHROID, LEVOTHROID) 125 MCG tablet Take 62 mcg by mouth daily.      . losartan (COZAAR) 100 MG tablet Take 1 tablet (100 mg total) by mouth daily.  30 tablet  6  . rosuvastatin (CRESTOR) 5 MG tablet 1/2 tab daily      . sertraline (ZOLOFT) 100 MG tablet Take 100 mg by mouth daily.      . warfarin (COUMADIN) 5 MG tablet Take 2.5-5 mg by mouth See admin  instructions. Take 1 tablet by mouth one Sun, Tues, Thur, Fri, Sat, and 0.5 tablets on Mon and Wed      . DISCONTD: ferrous sulfate 325 (65 FE) MG tablet Take 1 tablet (325 mg total) by mouth 2 (two) times daily.  60 tablet  6    Past Medical History  Diagnosis Date  . Atrial fibrillation   . CAD (coronary artery disease)   . Mitral valve disorder   . HTN (hypertension)   . Dyslipidemia   . Aortic stenosis, mild   . Breast cyst   . Hypertrophic cardiomyopathy     Past Surgical History  Procedure Date  . Laparoscopic cholecystectomy 2009  . Cataract extraction 2003    left  . Mitral valve replacement 2007  . Neck mass excision 2003  . Colonoscopy   . Broken wrist   . Broken ankle     No family history on file.  History   Social History  . Marital Status: Married    Spouse Name: N/A    Number of Children: N/A  . Years of Education: N/A   Occupational History  .   Not on file.   Social History Main Topics  . Smoking status: Former Smoker -- 1.0 packs/day for 40 years    Types: Cigarettes    Quit date: 05/21/1990  . Smokeless tobacco: Not on file  . Alcohol Use: No  . Drug Use: No  . Sexually Active: Not on file   Other Topics Concern  . Not on file   Social History Narrative  . No narrative on file    Review of Systems:  All systems reviewed.  They are negative to the above problem except as previously stated.  Vital Signs: BP 138/69  Pulse 63  Ht 5' 4" (1.626 m)  Wt 188 lb (85.276 kg)  BMI 32.27 kg/m2  Physical Exam Patient is in NAD HEENT:  Normocephalic, atraumatic. EOMI, PERRLA.  Neck: JVP is normal.  No bruits.  Lungs: clear to auscultation. No rales no wheezes.  Heart: Regular rate and rhythm. Normal S1, S2. No S3.   GR III/VI systolic murmur at RUSP.  Gr I/VI systolic murmur at apex. PMI not displaced.  Abdomen:  Supple, nontender. Normal bowel sounds. No masses. No hepatomegaly.  Extremities:   Good distal pulses throughout. Tr  lower  extremity edema. Varicose veins. Musculoskeletal :moving all extremities.  Neuro:   alert and oriented x3.  CN II-XII grossly intact.  EKG: SR 63  First degree AV block (PR 222msec)  LVH  Occasional PVC Assessment and Plan:  1,DYspnea.  Patient has had extensive work up.  She has signif LVH with HCM.  Diastolic dysfunction.  On R heart cath PCWP was increased.  Need to be careful with volume though given myoopathy. Does have MR but I am not convinced this is explaining. Rec:  Labs.  Diurese depending on values.   Have discussed TEE with patient to evaluate MR  She would like to proceed.  WIll schedule. E  2.  Hypertrophic Cardiomyopathy.  Volume status may be increased some.    3.  Afib.  Remains in SR.  On Amio  Back on coumadin which I was unaware happened.  GI work up was revealing for hemorrhoids.  Will need to follow Hgb closely  Would recomm monthly CBCs when INR is checked at Dr Pharr's office.  With her anatomy she will not tolerate signif anemia  4.  CAD  Not convinced of angina  5.  HL  Continue statin.   

## 2012-01-31 NOTE — Progress Notes (Signed)
  Echocardiogram Echocardiogram Transesophageal has been performed.  Kamber Vignola [name] 01/31/2012, 12:17 PM

## 2012-01-31 NOTE — Interval H&P Note (Signed)
History and Physical Interval Note:  01/31/2012 10:16 AM  Patricia [name]  has presented today for surgery, with the diagnosis of A FIB  The various methods of treatment have been discussed with the patient and family. After consideration of risks, benefits and other options for treatment, the patient has consented to  Procedure(s) (LRB) with comments: TRANSESOPHAGEAL ECHOCARDIOGRAM (TEE) (N/A) as a surgical intervention .  The patient's history has been reviewed, patient examined, no change in status, stable for surgery.  I have reviewed the patient's chart and labs.  Questions were answered to the patient's satisfaction.     Dietrich Pates

## 2012-01-31 NOTE — Op Note (Signed)
Full report to follow 

## 2012-02-01 ENCOUNTER — Encounter (HOSPITAL_COMMUNITY): Payer: Self-pay

## 2012-02-01 ENCOUNTER — Encounter (HOSPITAL_COMMUNITY): Payer: Self-pay | Admitting: Internal Medicine

## 2012-02-15 ENCOUNTER — Other Ambulatory Visit: Payer: Self-pay | Admitting: Internal Medicine

## 2012-02-15 ENCOUNTER — Ambulatory Visit
Admission: RE | Admit: 2012-02-15 | Discharge: 2012-02-15 | Disposition: A | Discharge status: Home / Self Care | Payer: Medicare Other | Source: Ambulatory Visit | Attending: Internal Medicine | Admitting: Internal Medicine

## 2012-02-15 DIAGNOSIS — S0990XA Unspecified injury of head, initial encounter: Secondary | ICD-10-CM

## 2012-02-15 MED ORDER — AMIODARONE HCL 200 MG PO TABS: 100.0000 mg | ORAL_TABLET | Freq: Every day | ORAL | Status: DC

## 2012-03-24 ENCOUNTER — Encounter: Payer: Self-pay | Admitting: Internal Medicine

## 2012-03-25 ENCOUNTER — Encounter: Payer: Self-pay | Admitting: Emergency Medicine

## 2012-03-25 ENCOUNTER — Ambulatory Visit (INDEPENDENT_AMBULATORY_CARE_PROVIDER_SITE_OTHER): Payer: Medicare Other | Admitting: Emergency Medicine

## 2012-03-25 VITALS — BP 122/70 | HR 60 | Temp 97.3°F | Ht 64.0 in | Wt 183.2 lb

## 2012-03-25 DIAGNOSIS — J4489 Other specified chronic obstructive pulmonary disease: Secondary | ICD-10-CM

## 2012-03-25 DIAGNOSIS — R0609 Other forms of dyspnea: Secondary | ICD-10-CM

## 2012-03-25 DIAGNOSIS — J449 Chronic obstructive pulmonary disease, unspecified: Secondary | ICD-10-CM

## 2012-03-25 DIAGNOSIS — K529 Noninfective gastroenteritis and colitis, unspecified: Secondary | ICD-10-CM | POA: Insufficient documentation

## 2012-03-25 DIAGNOSIS — Z9889 Other specified postprocedural states: Secondary | ICD-10-CM

## 2012-03-25 DIAGNOSIS — R0902 Hypoxemia: Secondary | ICD-10-CM

## 2012-03-25 DIAGNOSIS — R0989 Other specified symptoms and signs involving the circulatory and respiratory systems: Secondary | ICD-10-CM

## 2012-03-25 DIAGNOSIS — I421 Obstructive hypertrophic cardiomyopathy: Secondary | ICD-10-CM

## 2012-03-25 DIAGNOSIS — R197 Diarrhea, unspecified: Secondary | ICD-10-CM

## 2012-03-25 DIAGNOSIS — R0600 Dyspnea, unspecified: Secondary | ICD-10-CM

## 2012-03-25 DIAGNOSIS — G4733 Obstructive sleep apnea (adult) (pediatric): Secondary | ICD-10-CM

## 2012-03-25 DIAGNOSIS — Z8679 Personal history of other diseases of the circulatory system: Secondary | ICD-10-CM

## 2012-03-25 DIAGNOSIS — I2789 Other specified pulmonary heart diseases: Secondary | ICD-10-CM

## 2012-03-25 DIAGNOSIS — IMO0002 Reserved for concepts with insufficient information to code with codable children: Secondary | ICD-10-CM

## 2012-03-25 NOTE — Assessment & Plan Note (Signed)
Per Dr Ross's plans 

## 2012-03-25 NOTE — Assessment & Plan Note (Signed)
Her Hgb and TSH were reassuring. Do not believe that these factor are influencing her resp status. Suspect we are dealing with her cardiopulm fxn and her exertional hypoxemia

## 2012-03-25 NOTE — Assessment & Plan Note (Signed)
Did not benefit on empiric trial of SABA. May decide to try empiric Spiriva for a month in the future to see if she notices a change.

## 2012-03-25 NOTE — Assessment & Plan Note (Signed)
Due to L sided heart disease, A fib as well as her OSA and exertional hypoxemia -- all of which seem to be adequately controlled except for the daytime hypoxemia.

## 2012-03-25 NOTE — Assessment & Plan Note (Signed)
Per Dr Charlott Rakes plans

## 2012-03-25 NOTE — Assessment & Plan Note (Addendum)
Per Dr Ross's plans 

## 2012-03-25 NOTE — Assessment & Plan Note (Signed)
Underscored the importance of wearing her O2 with exertion. We have discussed this before - she has never used it reliably. I'm not surprised that they discovered desaturation with PT. She needs to wear it with all exercise. May needs a repeat titration to insure that 2L/min is adequate.

## 2012-03-25 NOTE — Progress Notes (Signed)
Subjective:    Patient ID: Patricia [name], female    DOB: 11/15/1926, 78 y.o.   MRN: [id number]  HPI Pleasant 78 yo woman followed by Drs Renne Crigler and Tenny Craw. She is a former smoker (40 pack-yrs) and has a history of mechanical MVR, atrial fibrillation, HTN with a hypertrophic LV and diastolic dysfunction w HOCM physiology. She has had some difficulty and functional decline in the past when either tachycardic or bradycardic, has been successfully managed with amiodarone.   Tells me that she has noticed slow progressive dyspnea over approximately 4-5 months, such that previously easy tasks like walking > 130m, shopping, have become difficult. She has needed to rest after about 136m, can't walk up hills. She has NOT heard wheeze, experienced CP or cough either at rest or with exertion. Her daughter has not heard any wheeze or upper airway noise. A review of Cardiology notes reveals that she has had SOB to a lesser degree farther back than Fall 2012 >> clonidine 0.1mg  was added to HTN regimen 5/25 but had to be stopped 6/8 due to symptomatic (dyspneic) bradycardia. She initially rebounded off the clonidine, but then experienced more SOB despite improvement in volume status and mild edema.    The subsequent evaluation included Stress TTE 12/14 showed low exercise tolerance but a small change in gradient w exertion:mLVOT gradient of at rest, w exertion; peakLVOT gradient at rest, to w exertion.  Unfortunately in late December she had an apparent URI that precipitated sx consistent with an AE-COPD that was treated with abx + prednisone (first and only time this has happened). She states that this illness dragged her down significantly, took several weeks for her to recover, experienced cough, dyspnea, malaise, decreased functional capacity.  PFT done 06/05/11 showed Mild AFL without BD response, normal volumes, decreased DLCO that corrects to normal range for Va.  She experienced a fall 1/17  with impact on her R chest, prompted a CT scan of the chest that showed some very mild basilar changes that could suggest interstitial process, but these are very subtle and could reflect atx or old mild scar. Based on her continued and now progressive dyspnea, and on coronary calcifications seen on chest CT, she had L and R heart cath on 06/26/11. I do not have the L heart data available but the patient tells me that her CAD has not progressed significantly compared with 2008 study. The R heart study showed no significant gradient across the AV, LVEDP 24, CO Fick 5.4 and Thermodil 4.0, mean PAOP between 20 - 26, RVP 53/5, PAP 50/20 (35). PVR 1.7 (Fick) and 2.3 (Thermodil).  She was evaluated by Dr Tenny Craw 2/18 and noted to be hypoxemic with ambulation.  ROV 08/08/11 -- Pleasant 78 yo woman w A fib, diastolic heart dz, MV disease and secondary PAH. 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 Last time we started O2 with exertion, trial xopenex. Plan to schedule PSG as a possible contributor to secondary PAH. BP was high today, 140/90. She has been using the O2 at home, but only on reserve when she is out of the house. She has used the xopenex prior to significant exertion, thinks it may have helped her some.   ROV 10/11/11 -- A fib, diastolic heart dz, MV disease and secondary PAH. 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 PSG showed OSA, CPAP titration ordered.  She is getting over a URI over the last 3 weeks. Overall she may feel better. She is wearing her  oxygen with some exertion, not all.   ROV 01/10/12 -- A fib, diastolic heart dz, MV disease and secondary PAH. 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 PSG showed OSA > started CPAP and believes she sleeps better. She feels that her breathing is better since last time. Not really sure what has changed  - she didn't notice a difference on the SABA. Anemia workup has shown no source GI bleeding. She is having some vision changes - blurred vision when she gets up in the am.   ROV 03/25/12 --  Pleasant 78 yo woman, hx of A fib, diastolic heart dz, MV disease and secondary PAH. Also a former smoker w mild AFL on spirometry.  Since last time she had repeat TEE 01/31/12 by Dr Tenny Craw >> no real change in her MV or her HOCM. She returns today and reports some pain in her upper quadrants vs her upper ribs, about 9 lbs wt loss (unintentional) since last visit, more DOE. She has been going to PT, but recently stopped it because as the workload increased she had more SOB, had documented hypoxemia. She has been dealing with chronic intermittent diarrhea, has a workup underway w Dr Renne Crigler. Her other chronic sx include frequent HA's, some imbalance (she has fallen 3 times in the last year!). She never had a clinical response to SABA prn. She remains on amiodarone.      Objective:   Physical Exam Filed Vitals:   03/25/12 1338  BP: 122/70  Pulse: 60  Temp: 97.3 F (36.3 C)  SpO2 95% on RA  Gen: Pleasant, well-nourished, slightly overweight, mild kyphosis, in no distress,  normal affect  ENT: No lesions,  mouth clear,  oropharynx clear, no postnasal drip, Mal Class 4 airway  Neck: No JVD, no TMG, no carotid bruits  Lungs: No use of accessory muscles, no dullness to percussion, clear without rales or rhonchi, no wheeze w normal breath or on forced exp  Cardiovascular: RRR, heart sounds normal, holosystolic M with intact mechanical S2, no LE edema  Musculoskeletal: No deformities, no cyanosis or clubbing, scar L wrist, intact pulses  Neuro: alert, non focal  Skin: Warm, no lesions or rashes, normal cap refill    TEE 01/31/12 --  Indications: Mitral regurgitation 424.0. Transesophageal echocardiography. 2D and color Doppler. Height: Height: 162.6cm. Height: 64in. Weight: Weight: 83.5kg. Weight: 183.7lb. Body mass index: BMI: 31.6kg/m^2. Body surface area: BSA: 1.41m^2. Blood pressure: 179/73. Patient status: Outpatient. Location:  Endoscopy. ------------------------------------------------------------ Left ventricle: LV is severely thickened, especially interventricular septum. LV cavity is small. IT is difficult to image LVOT because of reverberation artifact LVEF is normal. ------------------------------------------------------------ Aortic valve: AV is thickened, calcified. There is mildly restricted motion. Mild insufficiency. ------------------------------------------------------------ Aorta: Minimal plaquing of thoracic aorta. ------------------------------------------------------------ Mitral valve: s/p MV repair. MV is mildly thickened. There is mildly restricted motion from the repair. Peak and mean gradients through the valve are 31 and 10 mm Hg respectively. MVA by Pt1/2 is 1.7 cm2. There is mild MR predominantly from 2 jets, one being perivalvular. Doppler: Mean gradient: 9mm Hg (D). Peak gradient: 28mm Hg (D). ------------------------------------------------------------ Right ventricle: RV is normal in size with normal function. ------------------------------------------------------------ Pulmonic valve: PV is normal. ------------------------------------------------------------ Tricuspid valve: TV is normal. Trace TR. ----------------------------------------------------------- Post procedure conclusions Ascending Aorta: - Minimal plaquing of thoracic aorta.     Assessment & Plan:  Pulmonary hypertension, secondary Due to L sided heart disease, A fib as well as her OSA and exertional hypoxemia -- all of which seem to  be adequately controlled except for the daytime hypoxemia.   OSA (obstructive sleep apnea) CPAP 15 - seems to be tolerating except some facial pressure and HA that may relate to mask. Will follow, consider changing the mask at some point if continues to be a problem. Her ONO on CPAP was reassuring - she doesn;t need O2 bled in  Exercise hypoxemia Underscored the importance of  wearing her O2 with exertion. We have discussed this before - she has never used it reliably. I'm not surprised that they discovered desaturation with PT. She needs to wear it with all exercise. May needs a repeat titration to insure that 2L/min is adequate.   COPD, mild Did not benefit on empiric trial of SABA. May decide to try empiric Spiriva for a month in the future to see if she notices a change.   Hypertrophic obstructive cardiomyopathy Per Dr Charlott Rakes plans  MITRAL VALVE REPLACEMENT, HX OF Per Dr Charlott Rakes plans  ATRIAL FIBRILLATION, HX OF Per Dr Charlott Rakes plans  Dyspnea Her Hgb and TSH were reassuring. Do not believe that these factor are influencing her resp status. Suspect we are dealing with her cardiopulm fxn and her exertional hypoxemia  Chronic diarrhea The workup is underway by Dr Renne Crigler. She c/o some upper abd (lower thoracic) tightness and fullness, only happens at night, feels better to sit up >> suspect this is stomach bloating due to her CPAP   Levy Pupa, MD, PhD 03/25/2012, 3:04 PM [facility] [phone] or if no answer [phone]

## 2012-03-25 NOTE — Patient Instructions (Addendum)
Your lab work shows that your blood count and your thyroid replacement are normal.  You continue to have low oxygen when you walk or exert yourself. You need to wear your oxygen set on 2L/min with any exertion. Be very diligent about wearing it for the next month. We then can discuss whether this has made you feel any better.  Wear your CPAP every night Follow with Drs Tenny Craw and Renne Crigler as planned.

## 2012-03-25 NOTE — Assessment & Plan Note (Signed)
The workup is underway by Dr Renne Crigler. She c/o some upper abd (lower thoracic) tightness and fullness, only happens at night, feels better to sit up >> suspect this is stomach bloating due to her CPAP

## 2012-03-25 NOTE — Assessment & Plan Note (Signed)
CPAP 15 - seems to be tolerating except some facial pressure and HA that may relate to mask. Will follow, consider changing the mask at some point if continues to be a problem. Her ONO on CPAP was reassuring - she doesn;t need O2 bled in

## 2012-04-28 ENCOUNTER — Other Ambulatory Visit: Payer: Self-pay | Admitting: *Deleted

## 2012-04-28 DIAGNOSIS — I10 Essential (primary) hypertension: Secondary | ICD-10-CM

## 2012-04-28 MED ORDER — LOSARTAN POTASSIUM 100 MG PO TABS: 100.0000 mg | ORAL_TABLET | Freq: Every day | ORAL | Status: DC

## 2012-06-08 ENCOUNTER — Other Ambulatory Visit: Payer: Self-pay | Admitting: Internal Medicine

## 2012-07-14 ENCOUNTER — Ambulatory Visit: Payer: Medicare Other | Admitting: Emergency Medicine

## 2012-10-06 ENCOUNTER — Other Ambulatory Visit: Payer: Self-pay | Admitting: Internal Medicine

## 2012-11-14 ENCOUNTER — Other Ambulatory Visit: Payer: Self-pay | Admitting: *Deleted

## 2012-11-14 DIAGNOSIS — I10 Essential (primary) hypertension: Secondary | ICD-10-CM

## 2012-11-14 MED ORDER — LOSARTAN POTASSIUM 100 MG PO TABS: 100.0000 mg | ORAL_TABLET | Freq: Every day | ORAL | Status: DC

## 2012-11-14 NOTE — Telephone Encounter (Signed)
Refill sent. Ok per [facility] Friday 11/14/2012.

## 2012-11-24 ENCOUNTER — Telehealth: Payer: Self-pay | Admitting: Internal Medicine

## 2012-11-24 NOTE — Telephone Encounter (Signed)
Returned call to patient Dr.Ross advised to schedule appointment with her on Friday 11/28/12.Appointment scheduled with Dr.Ross 11/28/12 at 12:00 noon.Advised to call sooner if needed.

## 2012-11-24 NOTE — Telephone Encounter (Signed)
New Problem  Pt believes she is going into afib.  She said that the highest her hr has gotten in 126.

## 2012-11-24 NOTE — Telephone Encounter (Signed)
Returned call to patient she stated she noticed this past Saturday night her heart to be out of rhythm.Stated when she was getting ready to go to bed she checked her O2 sat and noticed on machine heart rate was 77 beats/min to 113 beats/min.Stated this morning when she checked her pulse with machine it was 123 beats/min.Stated at present heart rate 42 beats/min.Stated she feels ok just tires easy.Patient has not seen Dr.Ross since she had a TEE 01/31/12.Dr.Ross is in [place] office on 11/28/12 her schedule is full.Message sent to Dr.Ross for advice.

## 2012-11-24 NOTE — Telephone Encounter (Signed)
Overbook for Friday

## 2012-11-28 ENCOUNTER — Ambulatory Visit (INDEPENDENT_AMBULATORY_CARE_PROVIDER_SITE_OTHER): Payer: Medicare Other | Admitting: Internal Medicine

## 2012-11-28 ENCOUNTER — Encounter: Payer: Self-pay | Admitting: Internal Medicine

## 2012-11-28 VITALS — BP 118/82 | HR 102 | Wt 176.0 lb

## 2012-11-28 DIAGNOSIS — I2510 Atherosclerotic heart disease of native coronary artery without angina pectoris: Secondary | ICD-10-CM

## 2012-11-28 DIAGNOSIS — R0602 Shortness of breath: Secondary | ICD-10-CM

## 2012-11-28 DIAGNOSIS — I10 Essential (primary) hypertension: Secondary | ICD-10-CM

## 2012-11-28 DIAGNOSIS — I4891 Unspecified atrial fibrillation: Secondary | ICD-10-CM

## 2012-11-28 DIAGNOSIS — Z79899 Other long term (current) drug therapy: Secondary | ICD-10-CM

## 2012-11-28 LAB — CBC WITH DIFFERENTIAL/PLATELET
Basophils Absolute: 0.1 10*3/uL (ref 0.0–0.1)
Eosinophils Absolute: 0.4 10*3/uL (ref 0.0–0.7)
Eosinophils Relative: 4.6 % (ref 0.0–5.0)
Lymphocytes Relative: 9.8 % — ABNORMAL LOW (ref 12.0–46.0)
Monocytes Absolute: 0.4 10*3/uL (ref 0.1–1.0)
Monocytes Relative: 5.5 % (ref 3.0–12.0)
Neutrophils Relative %: 79.4 % — ABNORMAL HIGH (ref 43.0–77.0)
Platelets: 132 10*3/uL — ABNORMAL LOW (ref 150.0–400.0)
RBC: 4.38 Mil/uL (ref 3.87–5.11)
WBC: 8.1 10*3/uL (ref 4.5–10.5)

## 2012-11-28 LAB — BASIC METABOLIC PANEL
BUN: 19 mg/dL (ref 6–23)
CO2: 27 mEq/L (ref 19–32)
Calcium: 8.7 mg/dL (ref 8.4–10.5)
GFR: 56.2 mL/min — ABNORMAL LOW (ref >60.00)
Glucose, Bld: 88 mg/dL (ref 70–99)

## 2012-11-28 LAB — PROTIME-INR
INR: 2 ratio — ABNORMAL HIGH (ref 0.8–1.0)
Prothrombin Time: 21 s — ABNORMAL HIGH (ref 10.2–12.4)

## 2012-11-28 LAB — TSH: TSH: 1.3 u[IU]/mL (ref 0.35–5.50)

## 2012-11-28 LAB — BRAIN NATRIURETIC PEPTIDE: Pro B Natriuretic peptide (BNP): 749 pg/mL — ABNORMAL HIGH (ref 0.0–100.0)

## 2012-11-28 MED ORDER — METOPROLOL SUCCINATE ER 25 MG PO TB24: 25.0000 mg | ORAL_TABLET | Freq: Every day | ORAL | Status: DC

## 2012-11-28 MED ORDER — AMIODARONE HCL 200 MG PO TABS: 200.0000 mg | ORAL_TABLET | Freq: Every day | ORAL | Status: DC

## 2012-11-28 NOTE — Patient Instructions (Addendum)
You will need to have lab work today: cbc,cmp,bnp,tsh,PT/INR   We will call you with your results   Medication change   INCREASE: Amiodarone to 200mg  daily                                   START: Toprol 25mg  daily

## 2012-11-28 NOTE — Progress Notes (Signed)
HPI Patient is a 79 year old with a history of HOCM, very mild CAD hypertension, PAF, dyslipidemia. Limited stress echo showed no signif increase in LVOT gradient. She has had continued SOB CT of chest showed no fibrosis. R and L heart cath. showed no critical CAD, MR through mitral prosthesis of uncertain severity. PA pressures were increased with RA pressure of 53/10 and PCWP 24  I saw her in clinic in Aug 2013.  She has been seen since by R Byrum She called earlier this week to say that her pulse was increased.  I recomm she be seen in clinic for EKG She says her breathing is stable.  She denies CP  Does note that she gets up between 3 to 5 AM with some SOB  Goes to recliner and goes back to sleep.  No presyncope Allergies  Allergen Reactions  . Ivp Dye (Iodinated Diagnostic Agents)   . Nitrofurantoin     Current Outpatient Prescriptions  Medication Sig Dispense Refill  . amiodarone (PACERONE) 200 MG tablet TAKE 1/2 TABLET BY MOUTH DAILY.  30 tablet  1  . bifidobacterium infantis (ALIGN) capsule Take 1 capsule by mouth daily.      . Calcium 600-200 MG-UNIT per tablet Take 1 tablet by mouth daily.       . cholecalciferol (VITAMIN D) 1000 UNITS tablet Take 1,000 Units by mouth daily.        . Coenzyme Q10 (COQ10 PO) Take 300 mg by mouth daily.      . fish oil-omega-3 fatty acids 1000 MG capsule Take 1 g by mouth daily.       . furosemide (LASIX) 20 MG tablet Take as needed for swelling      . levothyroxine (SYNTHROID, LEVOTHROID) 125 MCG tablet 1/2 tab po qd      . losartan (COZAAR) 100 MG tablet Take 1 tablet (100 mg total) by mouth daily.  30 tablet  6  . rosuvastatin (CRESTOR) 5 MG tablet 1/2 tab daily      . sertraline (ZOLOFT) 100 MG tablet Take 100 mg by mouth daily.      [name] warfarin (COUMADIN) 5 MG tablet Take 2.5-5 mg by mouth See admin instructions. Take 1 tablet by mouth one Sun, Tues, Thur, Fri, Sat, and 0.5 tablets on Mon and Wed       No current facility-administered  medications for this visit.    Past Medical History  Diagnosis Date  . CAD (coronary artery disease)   . Mitral valve disorder   . HTN (hypertension)   . Dyslipidemia   . Aortic stenosis, mild   . Breast cyst   . Hypertrophic cardiomyopathy   . Sleep apnea   . COPD (chronic obstructive pulmonary disease)     "a touch of"; prn o2 at home with activity   . Atrial fibrillation   . Hypothyroidism   . Anemia     Past Surgical History  Procedure Laterality Date  . Laparoscopic cholecystectomy  2009  . Cataract extraction  2003    bilateral  . Mitral valve replacement  2007  . Neck mass excision  2003    benign  . Colonoscopy    . Broken wrist      left, metal implanted   . Broken ankle      right, metal implanted   . Cardiac catheterization  06/2011  . Breast surgery      bilateral cysts x3 removed, benign   . Tee without cardioversion  01/31/2012  Procedure: TRANSESOPHAGEAL ECHOCARDIOGRAM (TEE);  Surgeon: Pricilla Riffle, MD;  Location: [facility] ENDOSCOPY;  Service: Cardiovascular;  Laterality: N/A;    No family history on file.  History   Social History  . Marital Status: Married    Spouse Name: N/A    Number of Children: N/A  . Years of Education: N/A   Occupational History  . Not on file.   Social History Main Topics  . Smoking status: Former Smoker -- 1.00 packs/day for 40 years    Types: Cigarettes    Quit date: 05/21/1990  . Smokeless tobacco: Not on file  . Alcohol Use: No  . Drug Use: No  . Sexually Active: Not on file   Other Topics Concern  . Not on file   Social History Narrative  . No narrative on file    Review of Systems:  All systems reviewed.  They are negative to the above problem except as previously stated.  Vital Signs: BP 118/82  Pulse 102  Wt 176 lb (79.833 kg)  BMI 30.2 kg/m2  Physical Exam Patient is in NAD HEENT:  Normocephalic, atraumatic. EOMI, PERRLA.  Neck: JVP is normal.  No bruits.  Lungs: clear to auscultation. No  rales no wheezes.  Heart: Regular rate and rhythm. Normal S1, S2. No S3.   Gr III/Vi systolic murmur LSB to base  Abdomen:  Supple, nontender. Normal bowel sounds. No masses. No hepatomegaly.  Extremities:   Good distal pulses throughout. No lower extremity edema.  Musculoskeletal :moving all extremities.  Neuro:   alert and oriented x3.  CN II-XII grossly intact.  EKG  Atrial fibrllation 102 bpm  LVH with repolarization abnormality Assessment and Plan: 1  Atrial fibrllation. Patient is back in [employer]  With her cardiac history I think she will do better if she is back in SR>   She ays that her INR was a little low recently.  Will need to review. Overall for now she does not appera that symptomatic  Volume status looks pretty good Bryson Ha pon current amio dosing for now.  Add Toprl XL 25.  2.  HCM  Make changes as noted above.  3.  HL.

## 2012-12-05 ENCOUNTER — Telehealth: Payer: Self-pay | Admitting: Internal Medicine

## 2012-12-05 NOTE — Telephone Encounter (Signed)
Pt returns call today. Lab results given Mylo Red RN

## 2012-12-05 NOTE — Telephone Encounter (Signed)
Follow Up ° ° ° ° ° °Pt calling returning your call from earlier. Please call back. °

## 2012-12-15 ENCOUNTER — Encounter: Payer: Self-pay | Admitting: Internal Medicine

## 2012-12-15 LAB — PROTIME-INR

## 2012-12-16 ENCOUNTER — Telehealth: Payer: Self-pay | Admitting: Internal Medicine

## 2012-12-16 NOTE — Telephone Encounter (Signed)
New prob  Pt states since she has starting taking her new medicine she has not been feeling right in the middle of the night.

## 2012-12-16 NOTE — Telephone Encounter (Signed)
Since increasing Amiodarone to 200 mg and starting Metoprolol the heaviness in chest has gotten worse.  Wakes up between 3-4 am to go to restroom, starts hurting in chest. Once she gets up sits in machine and starts her breathing machine gets better and she goes on to sleep. Heart rate 65-75 now. Concerned that the medication changes may be causing, read Amiodarone may cause this. Will forward to Dr Tenny Craw for review.

## 2012-12-17 NOTE — Telephone Encounter (Signed)
Called patient  She is feeling worse.  Pulse she says is regular. I recomm that she cut back on amiodarone to 100 mg per day  Stop Toprol.XL She will call back on Monday with report on how she is feeling.

## 2012-12-18 NOTE — Telephone Encounter (Signed)
Will forward to Caswell Corwin RN and triage since I will be out of the office Monday

## 2012-12-22 NOTE — Telephone Encounter (Signed)
Spoke with patient. Pt states that amiodarone was decreased to 100mg  daily and Toprol XL was stopped 12/17/12, see phone note. Since then, pt states her heart rate has been as low 70 and as high as 133. Pt states she has just been taking it easy because the slightest activity makes her heart rate go up. She states she just doesn't feel good. Her BP yesterday was 151/101. I will forward to Dr Tenny Craw for review.

## 2012-12-22 NOTE — Telephone Encounter (Signed)
Spoke with patient. Pt states she is going to see Dr Renne Crigler this afternoon. She will ask the pharmacist there to fax INR results from last 1-2 months to Dr Tenny Craw.

## 2012-12-22 NOTE — Telephone Encounter (Signed)
Please get INRs from Dr Carolee Rota office for past 1 to 2 months.

## 2012-12-22 NOTE — Telephone Encounter (Signed)
Follow pt  Per pt her hr is still up to about 124 to 129. She said it is a little hard to breath but she is a little better today then she was yesterday but she said she is still not improving.

## 2012-12-23 NOTE — Telephone Encounter (Signed)
F/u   Pt returning a call/ she thinks dr Tenny Craw called her from [employer]

## 2012-12-23 NOTE — Telephone Encounter (Signed)
Pt states the [place] office called her yesterday.  Pt aware we have her INR results from Dr. Carolee Rota office. I do not see note where she was called. She thought it might be Dr. Tenny Craw b/c she was in [place] yesterday.  States she stopped drinking V8 last week. INR has improved as per her lab report. States also she is feeling better  Will forward to Dr. Tenny Craw. Mylo Red RN

## 2012-12-23 NOTE — Telephone Encounter (Signed)
Spoke to patient today  She is feeling better than this weekend Seen by Dr Renne Crigler yesterday I am trying to get in contact with someone in that office Plan:  Would like to get INR weekly for 3 wks  Then plan cardioversion after Will need pre cardioversion labs prior

## 2013-01-06 ENCOUNTER — Other Ambulatory Visit: Payer: Self-pay | Admitting: Emergency Medicine

## 2013-01-06 DIAGNOSIS — R0902 Hypoxemia: Secondary | ICD-10-CM

## 2013-01-07 ENCOUNTER — Ambulatory Visit (INDEPENDENT_AMBULATORY_CARE_PROVIDER_SITE_OTHER): Payer: Medicare Other | Admitting: Emergency Medicine

## 2013-01-07 ENCOUNTER — Encounter: Payer: Self-pay | Admitting: Emergency Medicine

## 2013-01-07 VITALS — BP 150/82 | HR 104 | Temp 97.3°F | Ht 65.0 in | Wt 183.4 lb

## 2013-01-07 DIAGNOSIS — IMO0002 Reserved for concepts with insufficient information to code with codable children: Secondary | ICD-10-CM

## 2013-01-07 NOTE — Patient Instructions (Addendum)
We will work to get your portable oxygen concentrator Please wear your oxygen with all exertion.  Wear your CPAP every night when sleeping Follow with Dr Delton Coombes in 6 months or sooner if you have any problems

## 2013-01-07 NOTE — Progress Notes (Signed)
Subjective:    Patient ID: Patricia [name], female    DOB: May 25, 1926, 79 y.o.   MRN: [id number]  HPI Pleasant 79 yo woman followed by Drs Renne Crigler and Tenny Craw. She is a former smoker (40 pack-yrs) and has a history of mechanical MVR, atrial fibrillation, HTN with a hypertrophic LV and diastolic dysfunction w HOCM physiology. She has had some difficulty and functional decline in the past when either tachycardic or bradycardic, has been successfully managed with amiodarone.   Tells me that she has noticed slow progressive dyspnea over approximately 4-5 months, such that previously easy tasks like walking > 127m, shopping, have become difficult. She has needed to rest after about 171m, can't walk up hills. She has NOT heard wheeze, experienced CP or cough either at rest or with exertion. Her daughter has not heard any wheeze or upper airway noise. A review of Cardiology notes reveals that she has had SOB to a lesser degree farther back than Fall 2012 >> clonidine 0.1mg  was added to HTN regimen 5/25 but had to be stopped 6/8 due to symptomatic (dyspneic) bradycardia. She initially rebounded off the clonidine, but then experienced more SOB despite improvement in volume status and mild edema.    The subsequent evaluation included Stress TTE 12/14 showed low exercise tolerance but a small change in gradient w exertion:mLVOT gradient of at rest, w exertion; peakLVOT gradient at rest, to w exertion.  Unfortunately in late December she had an apparent URI that precipitated sx consistent with an AE-COPD that was treated with abx + prednisone (first and only time this has happened). She states that this illness dragged her down significantly, took several weeks for her to recover, experienced cough, dyspnea, malaise, decreased functional capacity.  PFT done 06/05/11 showed Mild AFL without BD response, normal volumes, decreased DLCO that corrects to normal range for Va.  She experienced a fall 1/17  with impact on her R chest, prompted a CT scan of the chest that showed some very mild basilar changes that could suggest interstitial process, but these are very subtle and could reflect atx or old mild scar. Based on her continued and now progressive dyspnea, and on coronary calcifications seen on chest CT, she had L and R heart cath on 06/26/11. I do not have the L heart data available but the patient tells me that her CAD has not progressed significantly compared with 2008 study. The R heart study showed no significant gradient across the AV, LVEDP 24, CO Fick 5.4 and Thermodil 4.0, mean PAOP between 20 - 26, RVP 53/5, PAP 50/20 (35). PVR 1.7 (Fick) and 2.3 (Thermodil).  She was evaluated by Dr Tenny Craw 2/18 and noted to be hypoxemic with ambulation.  ROV 08/08/11 -- Pleasant 79 yo woman w A fib, diastolic heart dz, MV disease and secondary PAH. 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 Last time we started O2 with exertion, trial xopenex. Plan to schedule PSG as a possible contributor to secondary PAH. BP was high today, 140/90. She has been using the O2 at home, but only on reserve when she is out of the house. She has used the xopenex prior to significant exertion, thinks it may have helped her some.   ROV 10/11/11 -- A fib, diastolic heart dz, MV disease and secondary PAH. 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 PSG showed OSA, CPAP titration ordered.  She is getting over a URI over the last 3 weeks. Overall she may feel better. She is wearing her  oxygen with some exertion, not all.   ROV 01/10/12 -- A fib, diastolic heart dz, MV disease and secondary PAH. 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 PSG showed OSA > started CPAP and believes she sleeps better. She feels that her breathing is better since last time. Not really sure what has changed  - she didn't notice a difference on the SABA. Anemia workup has shown no source GI bleeding. She is having some vision changes - blurred vision when she gets up in the am.   ROV 03/25/12 --  Pleasant 79 yo woman, hx of A fib, diastolic heart dz, MV disease and secondary PAH. Also a former smoker w mild AFL on spirometry.  Since last time she had repeat TEE 01/31/12 by Dr Tenny Craw >> no real change in her MV or her HOCM. She returns today and reports some pain in her upper quadrants vs her upper ribs, about 9 lbs wt loss (unintentional) since last visit, more DOE. She has been going to PT, but recently stopped it because as the workload increased she had more SOB, had documented hypoxemia. She has been dealing with chronic intermittent diarrhea, has a workup underway w Dr Renne Crigler. Her other chronic sx include frequent HA's, some imbalance (she has fallen 3 times in the last year!). She never had a clinical response to SABA prn. She remains on amiodarone.   ROV 01/07/13 -- 79, secondary PAH due to diastolic fxn/HOCM, MV disease and decreased fxn when she is in A Fib. We have also identified OSA and exertional hypoxemia as contributors. She has been having more trouble, more dyspnea and chest pressure since July when she went back into A fib. She wants a Designer, jewellery, is interested in starting to wear it more reliably.      Objective:   Physical Exam Filed Vitals:   01/07/13 1512  BP: 150/82  Pulse: 104  Temp: 97.3 F (36.3 C)  SpO2 95% on RA  Gen: Pleasant, well-nourished, slightly overweight, mild kyphosis, in no distress,  normal affect  ENT: No lesions,  mouth clear,  oropharynx clear, no postnasal drip, Mal Class 4 airway  Neck: No JVD, no TMG, no carotid bruits  Lungs: No use of accessory muscles, no dullness to percussion, clear without rales or rhonchi, no wheeze w normal breath or on forced exp  Cardiovascular: RRR, heart sounds normal, holosystolic M with intact mechanical S2, no LE edema  Musculoskeletal: No deformities, no cyanosis or clubbing, scar L wrist, intact pulses  Neuro: alert, non focal  Skin: Warm, no lesions or rashes, normal cap refill    TEE  01/31/12 --  Indications: Mitral regurgitation 424.0. Transesophageal echocardiography. 2D and color Doppler. Height: Height: 162.6cm. Height: 64in. Weight: Weight: 83.5kg. Weight: 183.7lb. Body mass index: BMI: 31.6kg/m^2. Body surface area: BSA: 1.46m^2. Blood pressure: 179/73. Patient status: Outpatient. Location: Endoscopy. ------------------------------------------------------------ Left ventricle: LV is severely thickened, especially interventricular septum. LV cavity is small. IT is difficult to image LVOT because of reverberation artifact LVEF is normal. ------------------------------------------------------------ Aortic valve: AV is thickened, calcified. There is mildly restricted motion. Mild insufficiency. ------------------------------------------------------------ Aorta: Minimal plaquing of thoracic aorta. ------------------------------------------------------------ Mitral valve: s/p MV repair. MV is mildly thickened. There is mildly restricted motion from the repair. Peak and mean gradients through the valve are 31 and 10 mm Hg respectively. MVA by Pt1/2 is 1.7 cm2. There is mild MR predominantly from 2 jets, one being perivalvular. Doppler: Mean gradient: 9mm Hg (D). Peak gradient: 28mm Hg (D). ------------------------------------------------------------ Right ventricle:  RV is normal in size with normal function. ------------------------------------------------------------ Pulmonic valve: PV is normal. ------------------------------------------------------------ Tricuspid valve: TV is normal. Trace TR. ----------------------------------------------------------- Post procedure conclusions Ascending Aorta: - Minimal plaquing of thoracic aorta.     Assessment & Plan:  Pulmonary hypertension, secondary Good discussion about the indications for, need for her O2 and CPAP. Particular attention to the influence this will have on keeping her in NSR. She agrees and is  willing to try. I think the portable concentrator will make compliance easier.     Levy Pupa, MD, PhD 01/07/2013, 3:45 PM [facility] [phone] or if no answer [phone]

## 2013-01-07 NOTE — Assessment & Plan Note (Signed)
Good discussion about the indications for, need for her O2 and CPAP. Particular attention to the influence this will have on keeping her in NSR. She agrees and is willing to try. I think the portable concentrator will make compliance easier.

## 2013-01-14 ENCOUNTER — Encounter: Payer: Self-pay | Admitting: Internal Medicine

## 2013-01-15 ENCOUNTER — Other Ambulatory Visit (INDEPENDENT_AMBULATORY_CARE_PROVIDER_SITE_OTHER): Payer: Medicare Other

## 2013-01-15 ENCOUNTER — Encounter: Payer: Self-pay | Admitting: *Deleted

## 2013-01-15 ENCOUNTER — Telehealth: Payer: Self-pay | Admitting: *Deleted

## 2013-01-15 DIAGNOSIS — Z01818 Encounter for other preprocedural examination: Secondary | ICD-10-CM

## 2013-01-15 DIAGNOSIS — I4891 Unspecified atrial fibrillation: Secondary | ICD-10-CM

## 2013-01-15 LAB — CBC
Platelets: 136 10*3/uL — ABNORMAL LOW (ref 150.0–400.0)
RBC: 4.39 Mil/uL (ref 3.87–5.11)
WBC: 7.2 10*3/uL (ref 4.5–10.5)

## 2013-01-15 LAB — PROTIME-INR
INR: 2.7 ratio — ABNORMAL HIGH (ref 0.8–1.0)
Prothrombin Time: 28.3 s — ABNORMAL HIGH (ref 10.2–12.4)

## 2013-01-15 NOTE — Telephone Encounter (Signed)
Pt schedule for cardioversion tomorrow/ see letter, she will come today for labs.

## 2013-01-16 ENCOUNTER — Ambulatory Visit (HOSPITAL_COMMUNITY)
Admission: RE | Admit: 2013-01-16 | Discharge: 2013-01-16 | Disposition: A | Discharge status: Home / Self Care | Payer: Medicare Other | Source: Ambulatory Visit | Attending: Cardiovascular Disease | Admitting: Cardiovascular Disease

## 2013-01-16 ENCOUNTER — Encounter (HOSPITAL_COMMUNITY): Payer: Self-pay | Admitting: Anesthesiology

## 2013-01-16 ENCOUNTER — Encounter (HOSPITAL_COMMUNITY)
Admission: RE | Disposition: A | Discharge status: Home / Self Care | Payer: Self-pay | Source: Ambulatory Visit | Attending: Cardiovascular Disease

## 2013-01-16 ENCOUNTER — Ambulatory Visit (HOSPITAL_COMMUNITY): Payer: Medicare Other | Admitting: Anesthesiology

## 2013-01-16 ENCOUNTER — Encounter (HOSPITAL_COMMUNITY): Payer: Self-pay | Admitting: *Deleted

## 2013-01-16 DIAGNOSIS — I2510 Atherosclerotic heart disease of native coronary artery without angina pectoris: Secondary | ICD-10-CM | POA: Insufficient documentation

## 2013-01-16 DIAGNOSIS — E039 Hypothyroidism, unspecified: Secondary | ICD-10-CM | POA: Insufficient documentation

## 2013-01-16 DIAGNOSIS — I422 Other hypertrophic cardiomyopathy: Secondary | ICD-10-CM | POA: Insufficient documentation

## 2013-01-16 DIAGNOSIS — G4730 Sleep apnea, unspecified: Secondary | ICD-10-CM | POA: Insufficient documentation

## 2013-01-16 DIAGNOSIS — Z87891 Personal history of nicotine dependence: Secondary | ICD-10-CM | POA: Insufficient documentation

## 2013-01-16 DIAGNOSIS — I10 Essential (primary) hypertension: Secondary | ICD-10-CM | POA: Insufficient documentation

## 2013-01-16 DIAGNOSIS — E785 Hyperlipidemia, unspecified: Secondary | ICD-10-CM | POA: Insufficient documentation

## 2013-01-16 DIAGNOSIS — Z7901 Long term (current) use of anticoagulants: Secondary | ICD-10-CM | POA: Insufficient documentation

## 2013-01-16 DIAGNOSIS — J449 Chronic obstructive pulmonary disease, unspecified: Secondary | ICD-10-CM | POA: Insufficient documentation

## 2013-01-16 DIAGNOSIS — J4489 Other specified chronic obstructive pulmonary disease: Secondary | ICD-10-CM | POA: Insufficient documentation

## 2013-01-16 DIAGNOSIS — Z79899 Other long term (current) drug therapy: Secondary | ICD-10-CM | POA: Insufficient documentation

## 2013-01-16 DIAGNOSIS — Z888 Allergy status to other drugs, medicaments and biological substances status: Secondary | ICD-10-CM | POA: Insufficient documentation

## 2013-01-16 DIAGNOSIS — I4891 Unspecified atrial fibrillation: Secondary | ICD-10-CM

## 2013-01-16 DIAGNOSIS — D649 Anemia, unspecified: Secondary | ICD-10-CM | POA: Insufficient documentation

## 2013-01-16 DIAGNOSIS — Z91041 Radiographic dye allergy status: Secondary | ICD-10-CM | POA: Insufficient documentation

## 2013-01-16 DIAGNOSIS — H543 Unqualified visual loss, both eyes: Secondary | ICD-10-CM | POA: Insufficient documentation

## 2013-01-16 HISTORY — PX: CARDIOVERSION: SHX1299

## 2013-01-16 LAB — BASIC METABOLIC PANEL
Calcium: 8.8 mg/dL (ref 8.4–10.5)
GFR: 74.84 mL/min (ref >60.00)
Sodium: 138 mEq/L (ref 135–145)

## 2013-01-16 SURGERY — CARDIOVERSION
Anesthesia: Monitor Anesthesia Care | Wound class: Clean

## 2013-01-16 MED ORDER — PROPOFOL 10 MG/ML IV BOLUS
INTRAVENOUS | Status: DC | PRN
  Administered 2013-01-16: 50 mg via INTRAVENOUS

## 2013-01-16 MED ORDER — SODIUM CHLORIDE 0.9 % IV SOLN: INTRAVENOUS | Status: DC

## 2013-01-16 NOTE — Preoperative (Signed)
Beta Blockers   Reason not to administer Beta Blockers:Not Applicable 

## 2013-01-16 NOTE — Transfer of Care (Signed)
Immediate Anesthesia Transfer of Care Note  Patient: Patricia [name]  Procedure(s) Performed: Procedure(s): CARDIOVERSION (N/A)  Patient Location: PACU and Endoscopy Unit  Anesthesia Type:MAC  Level of Consciousness: awake, alert , oriented and sedated  Airway & Oxygen Therapy: Patient Spontanous Breathing and Patient connected to nasal cannula oxygen  Post-op Assessment: Report given to PACU RN, Post -op Vital signs reviewed and stable and Patient moving all extremities  Post vital signs: Reviewed and stable  Complications: No apparent anesthesia complications

## 2013-01-16 NOTE — H&P (Signed)
HPI  Patient is a 79 year old with a history of HOCM, very mild CAD hypertension, PAF, dyslipidemia. Limited stress echo showed no signif increase in LVOT gradient. She has had continued SOB CT of chest showed no fibrosis. R and L heart cath. showed no critical CAD, MR through mitral prosthesis of uncertain severity. PA pressures were increased with RA pressure of 53/10 and PCWP 24  I saw her in clinic in Aug 2013. She has been seen since by R Byrum  She called earlier this week to say that her pulse was increased. I recomm she be seen in clinic for EKG  She says her breathing is stable. She denies CP Does note that she gets up between 3 to 5 AM with some SOB Goes to recliner and goes back to sleep. No presyncope  Allergies   Allergen  Reactions   .  Ivp Dye (Iodinated Diagnostic Agents)    .  Nitrofurantoin     Current Outpatient Prescriptions   Medication  Sig  Dispense  Refill   .  amiodarone (PACERONE) 200 MG tablet  TAKE 1/2 TABLET BY MOUTH DAILY.  30 tablet  1   .  bifidobacterium infantis (ALIGN) capsule  Take 1 capsule by mouth daily.     .  Calcium 600-200 MG-UNIT per tablet  Take 1 tablet by mouth daily.     .  cholecalciferol (VITAMIN D) 1000 UNITS tablet  Take 1,000 Units by mouth daily.     .  Coenzyme Q10 (COQ10 PO)  Take 300 mg by mouth daily.     .  fish oil-omega-3 fatty acids 1000 MG capsule  Take 1 g by mouth daily.     .  furosemide (LASIX) 20 MG tablet  Take as needed for swelling     .  levothyroxine (SYNTHROID, LEVOTHROID) 125 MCG tablet  1/2 tab po qd     .  losartan (COZAAR) 100 MG tablet  Take 1 tablet (100 mg total) by mouth daily.  30 tablet  6   .  rosuvastatin (CRESTOR) 5 MG tablet  1/2 tab daily     .  sertraline (ZOLOFT) 100 MG tablet  Take 100 mg by mouth daily.     [name]  warfarin (COUMADIN) 5 MG tablet  Take 2.5-5 mg by mouth See admin instructions. Take 1 tablet by mouth one Sun, Tues, Thur, Fri, Sat, and 0.5 tablets on Mon and Wed      No current  facility-administered medications for this visit.    Past Medical History   Diagnosis  Date   .  CAD (coronary artery disease)    .  Mitral valve disorder    .  HTN (hypertension)    .  Dyslipidemia    .  Aortic stenosis, mild    .  Breast cyst    .  Hypertrophic cardiomyopathy    .  Sleep apnea    .  COPD (chronic obstructive pulmonary disease)      "a touch of"; prn o2 at home with activity   .  Atrial fibrillation    .  Hypothyroidism    .  Anemia     Past Surgical History   Procedure  Laterality  Date   .  Laparoscopic cholecystectomy   2009   .  Cataract extraction   2003     bilateral   .  Mitral valve replacement   2007   .  Neck mass excision   2003  benign   .  Colonoscopy     .  Broken wrist       left, metal implanted   .  Broken ankle       right, metal implanted   .  Cardiac catheterization   06/2011   .  Breast surgery       bilateral cysts x3 removed, benign   .  Tee without cardioversion   01/31/2012     Procedure: TRANSESOPHAGEAL ECHOCARDIOGRAM (TEE); Surgeon: Pricilla Riffle, MD; Location: [facility] ENDOSCOPY; Service: Cardiovascular; Laterality: N/A;   No family history on file.  History    Social History   .  Marital Status:  Married     Spouse Name:  N/A     Number of Children:  N/A   .  Years of Education:  N/A    Occupational History   .  Not on file.    Social History Main Topics   .  Smoking status:  Former Smoker -- 1.00 packs/day for 40 years     Types:  Cigarettes     Quit date:  05/21/1990   .  Smokeless tobacco:  Not on file   .  Alcohol Use:  No   .  Drug Use:  No   .  Sexually Active:  Not on file    Other Topics  Concern   .  Not on file    Social History Narrative   .  No narrative on file   Review of Systems: All systems reviewed. They are negative to the above problem except as previously stated.  Vital Signs:  BP 118/82  Pulse 102  Wt 176 lb (79.833 kg)  BMI 30.2 kg/m2  Physical Exam  Patient is in NAD  HEENT:  Normocephalic, atraumatic. EOMI, PERRLA.  Neck: JVP is normal. No bruits.  Lungs: clear to auscultation. No rales no wheezes.  Heart: Regular rate and rhythm. Normal S1, S2. No S3. Gr III/Vi systolic murmur LSB to base  Abdomen: Supple, nontender. Normal bowel sounds. No masses. No hepatomegaly.  Extremities: Good distal pulses throughout. No lower extremity edema.  Musculoskeletal :moving all extremities.  Neuro: alert and oriented x3. CN II-XII grossly intact.  EKG Atrial fibrllation 102 bpm LVH with repolarization abnormality    Assessment and Plan:   1 Atrial fibrllation. Patient is back in [employer] With her cardiac history I think she will do better if she is back in SR  Will proceed with cardioversion.   .sing

## 2013-01-16 NOTE — CV Procedure (Signed)
    Cardioversion Note  Patricia [name] [id number] 1926/06/15  Procedure: DC Cardioversion Indications: atrial fib  Procedure Details Consent: Obtained Time Out: Verified patient identification, verified procedure, site/side was marked, verified correct patient position, special equipment/implants available, Radiology Safety Procedures followed,  medications/allergies/relevent history reviewed, required imaging and test results available.  Performed  The patient has been on adequate anticoagulation.  The patient received IV Propofol 50 mg  for sedation.  Synchronous cardioversion was performed at 120 joules.  The cardioversion was successful.    Complications: No apparent complications Patient did tolerate procedure well.   Vesta Mixer, Montez Hageman., MD, [facility] 01/16/2013, 11:21 AM

## 2013-01-16 NOTE — Anesthesia Postprocedure Evaluation (Signed)
  Anesthesia Post-op Note  Patient: Patricia [name]  Procedure(s) Performed: Procedure(s): CARDIOVERSION (N/A)  Patient Location: PACU and Endoscopy Unit  Anesthesia Type:MAC  Level of Consciousness: awake, alert , oriented and sedated  Airway and Oxygen Therapy: Patient Spontanous Breathing and Patient connected to nasal cannula oxygen  Post-op Pain: none  Post-op Assessment: Post-op Vital signs reviewed, Patient's Cardiovascular Status Stable, Respiratory Function Stable, Patent Airway and No signs of Nausea or vomiting  Post-op Vital Signs: Reviewed and stable  Complications: No apparent anesthesia complications

## 2013-01-16 NOTE — Anesthesia Preprocedure Evaluation (Signed)
Anesthesia Evaluation  Patient identified by MRN, date of birth, ID band Patient awake    Reviewed: Allergy & Precautions  History of Anesthesia Complications Negative for: history of anesthetic complications  Airway Mallampati: III TM Distance: >3 FB Neck ROM: Full    Dental  (+) Partial Lower, Partial Upper and Dental Advisory Given   Pulmonary sleep apnea , COPD COPD inhaler and oxygen dependent, former smoker (quit 30 years ago),  breath sounds clear to auscultation  Pulmonary exam normal       Cardiovascular hypertension, Pt. on medications + dysrhythmias Atrial Fibrillation + Valvular Problems/Murmurs (S/p MVR, mild MR, mild AS) MR and AS Rhythm:Irregular Rate:Normal     Neuro/Psych    GI/Hepatic negative GI ROS, Neg liver ROS,   Endo/Other  Hypothyroidism   Renal/GU negative Renal ROS     Musculoskeletal   Abdominal (+) + obese,   Peds  Hematology   Anesthesia Other Findings   Reproductive/Obstetrics                           Anesthesia Physical Anesthesia Plan  ASA: III  Anesthesia Plan: General   Post-op Pain Management:    Induction: Intravenous  Airway Management Planned:   Additional Equipment:   Intra-op Plan:   Post-operative Plan:   Informed Consent: I have reviewed the patients History and Physical, chart, labs and discussed the procedure including the risks, benefits and alternatives for the proposed anesthesia with the patient or authorized representative who has indicated his/her understanding and acceptance.   Dental advisory given  Plan Discussed with: CRNA and Surgeon  Anesthesia Plan Comments: (Plan routine monitors, GA for CV)        Anesthesia Quick Evaluation

## 2013-01-19 ENCOUNTER — Encounter (HOSPITAL_COMMUNITY): Payer: Self-pay | Admitting: Cardiovascular Disease

## 2013-03-24 ENCOUNTER — Encounter (HOSPITAL_COMMUNITY): Payer: Self-pay | Admitting: Emergency Medicine

## 2013-03-24 ENCOUNTER — Inpatient Hospital Stay (HOSPITAL_COMMUNITY)
Admission: EM | Admit: 2013-03-24 | Discharge: 2013-03-27 | DRG: 391 | Disposition: A | Discharge status: Home / Self Care | Payer: Medicare Other | Attending: Internal Medicine | Admitting: Internal Medicine

## 2013-03-24 DIAGNOSIS — K529 Noninfective gastroenteritis and colitis, unspecified: Secondary | ICD-10-CM | POA: Diagnosis present

## 2013-03-24 DIAGNOSIS — Z79899 Other long term (current) drug therapy: Secondary | ICD-10-CM | POA: Diagnosis not present

## 2013-03-24 DIAGNOSIS — I119 Hypertensive heart disease without heart failure: Secondary | ICD-10-CM | POA: Diagnosis present

## 2013-03-24 DIAGNOSIS — G4733 Obstructive sleep apnea (adult) (pediatric): Secondary | ICD-10-CM | POA: Diagnosis present

## 2013-03-24 DIAGNOSIS — R197 Diarrhea, unspecified: Secondary | ICD-10-CM | POA: Diagnosis not present

## 2013-03-24 DIAGNOSIS — I421 Obstructive hypertrophic cardiomyopathy: Secondary | ICD-10-CM | POA: Diagnosis present

## 2013-03-24 DIAGNOSIS — D696 Thrombocytopenia, unspecified: Secondary | ICD-10-CM | POA: Diagnosis present

## 2013-03-24 DIAGNOSIS — I498 Other specified cardiac arrhythmias: Secondary | ICD-10-CM | POA: Diagnosis present

## 2013-03-24 DIAGNOSIS — Z7901 Long term (current) use of anticoagulants: Secondary | ICD-10-CM | POA: Diagnosis not present

## 2013-03-24 DIAGNOSIS — Z66 Do not resuscitate: Secondary | ICD-10-CM | POA: Diagnosis present

## 2013-03-24 DIAGNOSIS — A088 Other specified intestinal infections: Secondary | ICD-10-CM | POA: Diagnosis present

## 2013-03-24 DIAGNOSIS — E039 Hypothyroidism, unspecified: Secondary | ICD-10-CM | POA: Diagnosis present

## 2013-03-24 DIAGNOSIS — E876 Hypokalemia: Secondary | ICD-10-CM | POA: Diagnosis present

## 2013-03-24 DIAGNOSIS — E785 Hyperlipidemia, unspecified: Secondary | ICD-10-CM | POA: Diagnosis present

## 2013-03-24 DIAGNOSIS — N179 Acute kidney failure, unspecified: Secondary | ICD-10-CM | POA: Diagnosis present

## 2013-03-24 DIAGNOSIS — I10 Essential (primary) hypertension: Secondary | ICD-10-CM | POA: Diagnosis present

## 2013-03-24 DIAGNOSIS — R3989 Other symptoms and signs involving the genitourinary system: Secondary | ICD-10-CM | POA: Diagnosis present

## 2013-03-24 DIAGNOSIS — I482 Chronic atrial fibrillation, unspecified: Secondary | ICD-10-CM

## 2013-03-24 DIAGNOSIS — Z87891 Personal history of nicotine dependence: Secondary | ICD-10-CM

## 2013-03-24 DIAGNOSIS — I2510 Atherosclerotic heart disease of native coronary artery without angina pectoris: Secondary | ICD-10-CM | POA: Diagnosis present

## 2013-03-24 DIAGNOSIS — Z6829 Body mass index (BMI) 29.0-29.9, adult: Secondary | ICD-10-CM

## 2013-03-24 DIAGNOSIS — I4891 Unspecified atrial fibrillation: Secondary | ICD-10-CM | POA: Diagnosis present

## 2013-03-24 DIAGNOSIS — Z954 Presence of other heart-valve replacement: Secondary | ICD-10-CM

## 2013-03-24 DIAGNOSIS — J449 Chronic obstructive pulmonary disease, unspecified: Secondary | ICD-10-CM | POA: Diagnosis present

## 2013-03-24 DIAGNOSIS — I359 Nonrheumatic aortic valve disorder, unspecified: Secondary | ICD-10-CM | POA: Diagnosis present

## 2013-03-24 DIAGNOSIS — E43 Unspecified severe protein-calorie malnutrition: Secondary | ICD-10-CM | POA: Insufficient documentation

## 2013-03-24 DIAGNOSIS — E860 Dehydration: Secondary | ICD-10-CM | POA: Diagnosis present

## 2013-03-24 DIAGNOSIS — I059 Rheumatic mitral valve disease, unspecified: Secondary | ICD-10-CM | POA: Diagnosis present

## 2013-03-24 DIAGNOSIS — R791 Abnormal coagulation profile: Secondary | ICD-10-CM

## 2013-03-24 DIAGNOSIS — J4489 Other specified chronic obstructive pulmonary disease: Secondary | ICD-10-CM | POA: Diagnosis present

## 2013-03-24 LAB — CBC WITH DIFFERENTIAL/PLATELET
Basophils Relative: 0 % (ref 0–1)
Eosinophils Absolute: 0.5 10*3/uL (ref 0.0–0.7)
Eosinophils Relative: 5 % (ref 0–5)
HCT: 38 % (ref 36.0–46.0)
Hemoglobin: 12.4 g/dL (ref 12.0–15.0)
Lymphs Abs: 0.5 10*3/uL — ABNORMAL LOW (ref 0.7–4.0)
MCH: 25.2 pg — ABNORMAL LOW (ref 26.0–34.0)
MCHC: 32.6 g/dL (ref 30.0–36.0)
MCV: 77.1 fL — ABNORMAL LOW (ref 78.0–100.0)
Monocytes Absolute: 0.6 10*3/uL (ref 0.1–1.0)
Neutro Abs: 7.6 10*3/uL (ref 1.7–7.7)
RBC: 4.93 MIL/uL (ref 3.87–5.11)

## 2013-03-24 LAB — URINALYSIS, ROUTINE W REFLEX MICROSCOPIC
Glucose, UA: NEGATIVE mg/dL
Ketones, ur: NEGATIVE mg/dL
Leukocytes, UA: NEGATIVE
Nitrite: NEGATIVE
Specific Gravity, Urine: 1.021 (ref 1.005–1.030)
pH: 5.5 (ref 5.0–8.0)

## 2013-03-24 LAB — BASIC METABOLIC PANEL
BUN: 41 mg/dL — ABNORMAL HIGH (ref 6–23)
CO2: 20 mEq/L (ref 19–32)
Chloride: 104 mEq/L (ref 96–112)
Creatinine, Ser: 1.63 mg/dL — ABNORMAL HIGH (ref 0.50–1.10)
GFR calc Af Amer: 33 mL/min — ABNORMAL LOW (ref >90)
Potassium: 3.2 mEq/L — ABNORMAL LOW (ref 3.5–5.1)

## 2013-03-24 LAB — HEPATIC FUNCTION PANEL
ALT: 14 U/L (ref 0–35)
Albumin: 3.4 g/dL — ABNORMAL LOW (ref 3.5–5.2)
Bilirubin, Direct: 0.1 mg/dL (ref 0.0–0.3)
Indirect Bilirubin: 0.4 mg/dL (ref 0.3–0.9)
Total Bilirubin: 0.5 mg/dL (ref 0.3–1.2)

## 2013-03-24 LAB — CG4 I-STAT (LACTIC ACID): Lactic Acid, Venous: 1.13 mmol/L (ref 0.5–2.2)

## 2013-03-24 LAB — PROTIME-INR: INR: 6.52 (ref 0.00–1.49)

## 2013-03-24 LAB — TSH: TSH: 1.34 u[IU]/mL (ref 0.350–4.500)

## 2013-03-24 MED ORDER — SODIUM CHLORIDE 0.9 % IV BOLUS (SEPSIS)
1000.0000 mL | Freq: Once | INTRAVENOUS | Status: AC
  Administered 2013-03-24: 1000 mL via INTRAVENOUS

## 2013-03-24 MED ORDER — ONDANSETRON HCL 4 MG/2ML IJ SOLN: 4.0000 mg | Freq: Four times a day (QID) | INTRAMUSCULAR | Status: DC | PRN

## 2013-03-24 MED ORDER — SERTRALINE HCL 100 MG PO TABS
100.0000 mg | ORAL_TABLET | Freq: Every day | ORAL | Status: DC
  Administered 2013-03-24 – 2013-03-27 (×4): 100 mg via ORAL
  Filled 2013-03-24 (×4): qty 1

## 2013-03-24 MED ORDER — AMIODARONE HCL 100 MG PO TABS
100.0000 mg | ORAL_TABLET | Freq: Every day | ORAL | Status: DC
  Administered 2013-03-24 – 2013-03-27 (×4): 100 mg via ORAL
  Filled 2013-03-24 (×4): qty 1

## 2013-03-24 MED ORDER — LEVOTHYROXINE SODIUM 125 MCG PO TABS
62.5000 ug | ORAL_TABLET | Freq: Every day | ORAL | Status: DC
  Administered 2013-03-25 – 2013-03-27 (×3): 62.5 ug via ORAL
  Filled 2013-03-24 (×4): qty 0.5

## 2013-03-24 MED ORDER — HEPARIN SODIUM (PORCINE) 5000 UNIT/ML IJ SOLN: 5000.0000 [IU] | Freq: Three times a day (TID) | INTRAMUSCULAR | Status: DC

## 2013-03-24 MED ORDER — KCL IN DEXTROSE-NACL 20-5-0.45 MEQ/L-%-% IV SOLN
INTRAVENOUS | Status: DC
  Administered 2013-03-24 – 2013-03-25 (×2): via INTRAVENOUS
  Administered 2013-03-25: 100 mL/h via INTRAVENOUS
  Administered 2013-03-25: 100 mL via INTRAVENOUS
  Filled 2013-03-24 (×6): qty 1000

## 2013-03-24 MED ORDER — ONDANSETRON HCL 4 MG PO TABS: 4.0000 mg | ORAL_TABLET | Freq: Four times a day (QID) | ORAL | Status: DC | PRN

## 2013-03-24 MED ORDER — ACETAMINOPHEN 650 MG RE SUPP: 650.0000 mg | Freq: Four times a day (QID) | RECTAL | Status: DC | PRN

## 2013-03-24 MED ORDER — SODIUM CHLORIDE 0.9 % IV SOLN
INTRAVENOUS | Status: DC
  Administered 2013-03-24: 13:00:00 via INTRAVENOUS

## 2013-03-24 MED ORDER — WARFARIN - PHARMACIST DOSING INPATIENT: Freq: Every day | Status: DC

## 2013-03-24 MED ORDER — ACETAMINOPHEN 325 MG PO TABS
650.0000 mg | ORAL_TABLET | Freq: Four times a day (QID) | ORAL | Status: DC | PRN
  Administered 2013-03-27: 650 mg via ORAL
  Filled 2013-03-24: qty 2

## 2013-03-24 NOTE — Progress Notes (Signed)
ANTICOAGULATION CONSULT NOTE - Initial Consult  Pharmacy Consult for Warfarin Indication: atrial fibrillation  Allergies  Allergen Reactions  . Ivp Dye [Iodinated Diagnostic Agents] Itching  . Nitrofurantoin Itching  . Ramipril     unknown    Patient Measurements: Height: 5\' 4"  (162.6 cm) Weight: 170 lb (77.111 kg) IBW/kg (Calculated) : 54.7  Vital Signs: Temp: 98.1 F (36.7 C) (11/04 1419) Temp src: Oral (11/04 1419) BP: 129/69 mmHg (11/04 1419) Pulse Rate: 59 (11/04 1419)  Labs:  Recent Labs  03/24/13 1225  HGB 12.4  HCT 38.0  PLT 128*  LABPROT 54.4*  INR 6.52*  CREATININE 1.63*    Estimated Creatinine Clearance: 26.3 ml/min (by C-G formula based on Cr of 1.63).   Medical History: Past Medical History  Diagnosis Date  . CAD (coronary artery disease)   . Mitral valve disorder   . HTN (hypertension)   . Dyslipidemia   . Aortic stenosis, mild   . Breast cyst   . Hypertrophic cardiomyopathy   . Sleep apnea   . COPD (chronic obstructive pulmonary disease)     "a touch of"; prn o2 at home with activity   . Atrial fibrillation   . Hypothyroidism   . Anemia     Medications:  Home dose: Warfarin 3.5mg  daily (2.5mg  + 1mg ), last dose 11/3  Assessment: 40 yoF admitted with nausea, vomiting, and diarrhea.  PMHx significant for CAD, HTN, HLD, COPD, hypothyroidism, anemia, AS, and atrial fibrilliation with mechanical MVR on warfarin (higher INR goal) s/p successful cardioversion 1 month ago.   INR on admission = 6.52 (goal 2.5-3.5)  CBC: Plts 128, hgb WNL, no bleeding noted  Clear liquid diet  Goal of Therapy:  INR 2.5-3.5 Monitor platelets by anticoagulation protocol: Yes   Plan:  Hold Coumadin tonight.  Daily PT/INR  Haynes Hoehn, PharmD 03/24/2013, 3:26 PM  Pager: [phone]

## 2013-03-24 NOTE — H&P (Addendum)
[facility] History and Physical  JAYNE PECKENPAUGH ZOX:[id number] DOB: February 07, 1927 DOA: 03/24/2013  Referring physician:  Pricilla Loveless PCP:  Londell Moh, MD   Chief Complaint:  Nausea, vomiting, diarrhea  HPI:  The patient is a 79 y.o. year-old female with history of CAD, HTN, HLD, AS, MV, COPD, hypothyroidism, anemia who presents with nausea, vomiting, diarrhea.  The patient was last at their baseline health approximately 9 days ago.  The patient states that she has had problems with intermittent watery diarrhea for the last several years.  She was diagnosed with giardia about 1-2 years ago and her diarrhea temporarily improved and she was tested for cure and was negative.  The source was never identified.  She uses city water.  She states her bowels never returned to normal after that time and intermittently she develops watery stool which lasts for a few days and resolves with imodium, occasionally associated with nausea and vomiting.  She has had watery diarrhea 4-6 times per day with stool incontinence for the last 9 days with vomiting about once per day, nonbilious, nonbloody.  She denies blood in her stools.  Her stools smell bad but do not float.  Denies melena.  Denies fevers, chills, and abdominal pain, although she has some cramps occasionally.  She was trying to drink gator-ade but was unable to stay hydrated so her PCP recommended she come to the ER.    In the ER, her labs were notable for Na 137, potassium 3.2, BUK 41, creatinine 1.63 elevated from her baseline of 0.8.  UA is pending as are lipase and LFTs.  Review of Systems:  General:  9-lb weight loss over last week HEENT:  Denies changes to hearing and vision, rhinorrhea, sinus congestion, sore throat CV:  Denies chest pain and palpitations, lower extremity edema.  PULM:  Denies SOB, wheezing, cough.   GI:  Per HPI  GU:  Denies dysuria, frequency, urgency ENDO:  Decreased uop over last two days   HEME:  Denies  hematemesis, blood in stools, melena, abnormal bruising or bleeding.  LYMPH:  Denies lymphadenopathy.   MSK:  Denies arthralgias, myalgias.   DERM:  Denies skin rash or ulcer.   NEURO:  Denies focal numbness, weakness, slurred speech, confusion, facial droop.  PSYCH:  Denies anxiety and depression.    Past Medical History  Diagnosis Date  . CAD (coronary artery disease)   . Mitral valve disorder   . HTN (hypertension)   . Dyslipidemia   . Aortic stenosis, mild   . Breast cyst   . Hypertrophic cardiomyopathy   . Sleep apnea   . COPD (chronic obstructive pulmonary disease)     "a touch of"; prn o2 at home with activity   . Atrial fibrillation   . Hypothyroidism   . Anemia    Past Surgical History  Procedure Laterality Date  . Laparoscopic cholecystectomy  2009  . Cataract extraction  2003    bilateral  . Mitral valve replacement  2007  . Neck mass excision  2003    benign  . Colonoscopy    . Broken wrist      left, metal implanted   . Broken ankle      right, metal implanted   . Cardiac catheterization  06/2011  . Breast surgery      bilateral cysts x3 removed, benign   . Tee without cardioversion  01/31/2012    Procedure: TRANSESOPHAGEAL ECHOCARDIOGRAM (TEE);  Surgeon: Pricilla Riffle, MD;  Location: [facility] ENDOSCOPY;  Service: Cardiovascular;  Laterality: N/A;  . Cardioversion N/A 01/16/2013    Procedure: CARDIOVERSION;  Surgeon: Vesta Mixer, MD;  Location: [facility] ENDOSCOPY;  Service: Cardiovascular;  Laterality: N/A;   Social History:  reports that she quit smoking about 22 years ago. Her smoking use included Cigarettes. She has a 40 pack-year smoking history. She has never used smokeless tobacco. She reports that she does not drink alcohol or use illicit drugs. Lives with her husband.  Drives and walks around without assist device.    Allergies  Allergen Reactions  . Ivp Dye [Iodinated Diagnostic Agents] Itching  . Nitrofurantoin Itching  . Ramipril     unknown     Family History  Problem Relation Age of Onset  . Diabetes Mother   . CVA Mother   . Other Father   . Diabetes Brother   . Cancer Brother     throat ca, smoker  . Aneurysm Brother     brain aneurysm  . Breast cancer Sister   . Emphysema Sister     smoker     Prior to Admission medications   Medication Sig Start Date End Date Taking? Authorizing Provider  amiodarone (PACERONE) 200 MG tablet Take 100 mg by mouth daily. 11/28/12  Yes Pricilla Riffle, MD  Calcium 600-200 MG-UNIT per tablet Take 1 tablet by mouth at bedtime.    Yes Historical Provider, MD  cholecalciferol (VITAMIN D) 1000 UNITS tablet Take 1,000 Units by mouth at bedtime.    Yes Historical Provider, MD  Coenzyme Q10 (COQ10 PO) Take 300 mg by mouth daily.   Yes Historical Provider, MD  Flaxseed, Linseed, (TH FLAX SEED OIL PO) Take 1 capsule by mouth daily.   Yes Historical Provider, MD  furosemide (LASIX) 20 MG tablet Take 20 mg by mouth See admin instructions. Take as needed for swelling up to 4 tablets a week. 01/18/12  Yes Pricilla Riffle, MD  levothyroxine (SYNTHROID, LEVOTHROID) 125 MCG tablet 1/2 tab po qd   Yes Historical Provider, MD  loperamide (IMODIUM) 2 MG capsule Take 2 mg by mouth as needed for diarrhea or loose stools.   Yes Historical Provider, MD  losartan (COZAAR) 100 MG tablet Take 1 tablet (100 mg total) by mouth daily. 11/14/12  Yes Pricilla Riffle, MD  Probiotic Product (PROBIOTIC DAILY PO) Take 1 tablet by mouth daily.   Yes Historical Provider, MD  sertraline (ZOLOFT) 100 MG tablet Take 100 mg by mouth daily.   Yes Historical Provider, MD  warfarin (COUMADIN) 1 MG tablet Take 1 mg by mouth daily at 6 PM. Takes 2.5mg  daily with 1mg  tablet for total dose 3.5mg .   Yes Historical Provider, MD  warfarin (COUMADIN) 5 MG tablet Take 2.5 mg by mouth daily at 6 PM. Takes 2.5mg  daily with 1mg  tablet for total dose 3.5mg . 08/02/11  Yes Pricilla Riffle, MD   Physical Exam: Filed Vitals:   03/24/13 1225 03/24/13 1230  03/24/13 1300 03/24/13 1330  BP:  107/47 123/51 116/81  Pulse:  58 61 59  Temp:      TempSrc:      Resp:      SpO2: 88% 93% 96% 96%     General:  CF, NAD  Eyes:  PERRL, anicteric, non-injected.  ENT:  Nares clear.  OP clear, non-erythematous without plaques or exudates.  MMM.  Neck:  Supple without TM or JVD.    Lymph:  No cervical, supraclavicular, or submandibular LAD.  Cardiovascular:  RRR, normal S1, S2,  3/6 systolic murmur loudest at the RSB, no rubs or gallops.  2+ pulses, warm extremities  Respiratory:  CTA bilaterally without increased WOB.  Abdomen:  NABS.  Soft, ND/NT.    Skin:  No rashes or focal lesions.  Musculoskeletal:  Normal bulk and tone.  Trace bilateral LE edema.  Psychiatric:  A & O x 4.  Appropriate affect.  Neurologic:  CN 3-12 intact.  5/5 strength.  Sensation intact.  Labs on Admission:  Basic Metabolic Panel:  Recent Labs Lab 03/24/13 1225  NA 137  K 3.2*  CL 104  CO2 20  GLUCOSE 110*  BUN 41*  CREATININE 1.63*  CALCIUM 9.3   Liver Function Tests:  Recent Labs Lab 03/24/13 1225  AST 23  ALT 14  ALKPHOS 93  BILITOT 0.5  PROT 7.1  ALBUMIN 3.4*    Recent Labs Lab 03/24/13 1225  LIPASE 88*   No results found for this basename: AMMONIA,  in the last 168 hours CBC:  Recent Labs Lab 03/24/13 1225  WBC 9.2  NEUTROABS 7.6  HGB 12.4  HCT 38.0  MCV 77.1*  PLT 128*   Cardiac Enzymes: No results found for this basename: CKTOTAL, CKMB, CKMBINDEX, TROPONINI,  in the last 168 hours  BNP (last 3 results)  Recent Labs  11/28/12 1325  PROBNP 749.0*   CBG: No results found for this basename: GLUCAP,  in the last 168 hours  Radiological Exams on Admission: No results found.  EKG: pending  Assessment/Plan Active Problems:   * No active hospital problems. *  ---  Diarrhea.  DDx includes infectious diarrhea including gastroenteritis, ischemic colitis, diveriticulitis although the latter two are less likely given  the absence of abdominal pain.  Given history of Giardia, the source of which was never identified, she could have been re-exposed.  Her intermittent/chronic symptoms could suggest parasitic infection.   -  Enteric precautions -  C. Diff -  Stool culture -  Stool O&P x 3 specimens -  Clear liquid diet and advance as tolerated -  Okay to start imodium if C. Diff is negative  Acute kidney injury likely due to dehydration.  Baseline creatinine is 0.8.  Peak creatinine 1.63.   -  UA pending -  Minimize nephrotoxins and avoid ACEI/ARB (hold losartan) -  Hydration and consider further work up if not improving  CAD, chest pain free.  Not on aspirin due to warfarin and not on beta blocker due to symptomatic bradycardia -  Hold ARB due to AKI  HOCM with mild subaortic stenosis.  Stable.    HTN/HLD, blood pressure stable.  Hold ARB and trend BP.  Hold cholesterol medications for now  Atrial fibrilliation s/p successful cardioversion one month ago -  Continue amiodarone -  Warfarin dosing per pharmacy -  INR supratherapeutic due to recent diarrhea and poor PO intake (decreased vit K by mouth and enhanced warfarin absorption)   MVR with bioprosthetic valve, continue A/C  COPD, stable.  On 2L [place] with exertion  OSA, stable.  Continue CPAP Hypothyroidism, stable.  Check TSH in case diarrhea related to hyperthyroidism.  Continue synthroid. Hypokalemia, add potassium to IVF.  Thrombocytopenia, very mild.  HUS/TTP unlikely as plt count only mildly depressed and hgb normal.    Diet:  CLD Access:  PIV IVF:  yes Proph:  Warfarin per pharmacy  Code Status: DNR Family Communication: patient and her daughter Aggie Cosier Disposition Plan: Admit to med-surg  Time spent: 60 min Etan Vasudevan [facility]  [phone]  If 7PM-7AM, please contact night-coverage [email or web address] Password [email or web address] 03/24/2013, 2:17 PM

## 2013-03-24 NOTE — Progress Notes (Signed)
   CARE MANAGEMENT ED NOTE 03/24/2013  Patient:  Patricia [name], Patricia [name]   Account Number:  [id number]  Date Initiated:  03/24/2013  Documentation initiated by:  Edd Arbour  Subjective/Objective Assessment:   79 yr old [employer] medicare pt with N/V/D BUn 41 Creat 1.63 lipase 88 given bolus     Subjective/Objective Assessment Detail:     Action/Plan:   UR completed   Action/Plan Detail:   Anticipated DC Date:  03/27/2013     Status Recommendation to Physician:   Result of Recommendation:    Other ED Services  Consult Working Plan    DC Planning Services  Other    Choice offered to / List presented to:            Status of service:  Completed, signed off  ED Comments:   ED Comments Detail:

## 2013-03-24 NOTE — ED Notes (Signed)
Pt c/o n/v/d x1wk, saw PCP yesterday they said she was dehydrated and drink gatorade but pt still vomiting and diarrhea

## 2013-03-24 NOTE — ED Provider Notes (Signed)
CSN: [id number]     Arrival date & time 03/24/13  1140 History   First MD Initiated Contact with Patient 03/24/13 1144     Chief Complaint  Patient presents with  . Diarrhea  . Emesis   (Consider location/radiation/quality/duration/timing/severity/associated sxs/prior Treatment) HPI Comments: 79 year old female presents with 9 days of watery diarrhea and intermittent vomiting. She is able to keep down fluids but has not had an appetite to eat food. She states this started shortly after eating some [employer]. there no other sick contacts. No recent antibiotics. No bloody stools. She was seen at her PCPs office yesterday and had blood work in stool samples done. She's not other results of those tests. She's not had any fevers or this time. Denies any abdominal pain. She felt generalized weakness but has not had any near syncope symptoms. She called her PCPs office and they recommended coming in to be reevaluated but she said to come to the ER instead.   Past Medical History  Diagnosis Date  . CAD (coronary artery disease)   . Mitral valve disorder   . HTN (hypertension)   . Dyslipidemia   . Aortic stenosis, mild   . Breast cyst   . Hypertrophic cardiomyopathy   . Sleep apnea   . COPD (chronic obstructive pulmonary disease)     "a touch of"; prn o2 at home with activity   . Atrial fibrillation   . Hypothyroidism   . Anemia    Past Surgical History  Procedure Laterality Date  . Laparoscopic cholecystectomy  2009  . Cataract extraction  2003    bilateral  . Mitral valve replacement  2007  . Neck mass excision  2003    benign  . Colonoscopy    . Broken wrist      left, metal implanted   . Broken ankle      right, metal implanted   . Cardiac catheterization  06/2011  . Breast surgery      bilateral cysts x3 removed, benign   . Tee without cardioversion  01/31/2012    Procedure: TRANSESOPHAGEAL ECHOCARDIOGRAM (TEE);  Surgeon: Pricilla Riffle, MD;  Location: [facility] ENDOSCOPY;  Service:  Cardiovascular;  Laterality: N/A;  . Cardioversion N/A 01/16/2013    Procedure: CARDIOVERSION;  Surgeon: Vesta Mixer, MD;  Location: [facility] ENDOSCOPY;  Service: Cardiovascular;  Laterality: N/A;   No family history on file. History  Substance Use Topics  . Smoking status: Former Smoker -- 1.00 packs/day for 40 years    Types: Cigarettes    Quit date: 05/21/1990  . Smokeless tobacco: Never Used  . Alcohol Use: No   OB History   Grav Para Term Preterm Abortions TAB SAB Ect Mult Living                 Review of Systems  Constitutional: Negative for fever and chills.  Respiratory: Negative for shortness of breath.   Cardiovascular: Negative for chest pain.  Gastrointestinal: Positive for vomiting and diarrhea. Negative for nausea, abdominal pain, blood in stool and abdominal distention.  Genitourinary: Positive for decreased urine volume. Negative for dysuria.  Musculoskeletal: Negative for back pain.  Neurological: Positive for weakness. Negative for dizziness, light-headedness and headaches.  All other systems reviewed and are negative.    Allergies  Ivp dye and Nitrofurantoin  Home Medications   Current Outpatient Rx  Name  Route  Sig  Dispense  Refill  . amiodarone (PACERONE) 200 MG tablet   Oral   Take 100 mg by  mouth daily.         . bifidobacterium infantis (ALIGN) capsule   Oral   Take 1 capsule by mouth daily.         . Calcium 600-200 MG-UNIT per tablet   Oral   Take 1 tablet by mouth daily.          . cholecalciferol (VITAMIN D) 1000 UNITS tablet   Oral   Take 1,000 Units by mouth daily.           . Coenzyme Q10 (COQ10 PO)   Oral   Take 300 mg by mouth daily.         . fish oil-omega-3 fatty acids 1000 MG capsule   Oral   Take 1 g by mouth daily.          . furosemide (LASIX) 20 MG tablet      Take as needed for swelling         . levothyroxine (SYNTHROID, LEVOTHROID) 125 MCG tablet      1/2 tab po qd         . losartan  (COZAAR) 100 MG tablet   Oral   Take 1 tablet (100 mg total) by mouth daily.   30 tablet   6   . rosuvastatin (CRESTOR) 5 MG tablet      1/2 tab daily         . sertraline (ZOLOFT) 100 MG tablet   Oral   Take 100 mg by mouth daily.         [name] warfarin (COUMADIN) 5 MG tablet   Oral   Take 2.5-5 mg by mouth See admin instructions. Take 1 tablet by mouth one Sun, Tues, Thur, Fri, Sat, and 0.5 tablets on Mon and Wed          BP 110/54  Pulse 66  Temp(Src) 97.6 F (36.4 C) (Oral)  Resp 20  SpO2 93% Physical Exam  Vitals reviewed. Constitutional: She is oriented to person, place, and time. She appears well-developed and well-nourished.  HENT:  Head: Normocephalic and atraumatic.  Right Ear: External ear normal.  Left Ear: External ear normal.  Nose: Nose normal.  Eyes: Right eye exhibits no discharge. Left eye exhibits no discharge.  Cardiovascular: Normal rate and regular rhythm.   Murmur heard. Pulmonary/Chest: Effort normal and breath sounds normal.  Abdominal: Soft. She exhibits no distension. There is no tenderness.  Neurological: She is alert and oriented to person, place, and time.  Skin: Skin is warm and dry.    ED Course  Procedures (including critical care time) Labs Review Labs Reviewed  CBC WITH DIFFERENTIAL - Abnormal; Notable for the following:    MCV 77.1 (*)    MCH 25.2 (*)    RDW 17.2 (*)    Platelets 128 (*)    Neutrophils Relative % 83 (*)    Lymphocytes Relative 5 (*)    Lymphs Abs 0.5 (*)    All other components within normal limits  BASIC METABOLIC PANEL - Abnormal; Notable for the following:    Potassium 3.2 (*)    Glucose, Bld 110 (*)    BUN 41 (*)    Creatinine, Ser 1.63 (*)    GFR calc non Af Amer 28 (*)    GFR calc Af Amer 33 (*)    All other components within normal limits  PROTIME-INR - Abnormal; Notable for the following:    Prothrombin Time 54.4 (*)    INR 6.52 (*)    All  other components within normal limits   URINALYSIS, ROUTINE W REFLEX MICROSCOPIC  CG4 I-STAT (LACTIC ACID)   Imaging Review No results found.  EKG Interpretation   None       MDM   1. Diarrhea   2. Acute renal failure    Patient appears well but has had poor PO intake in setting of vomiting and diarrhea. Benign abd exam. Given her acute renal dysfunction, will admit for fluids and symptom control    Audree Camel, MD 03/24/13 1333

## 2013-03-25 DIAGNOSIS — R197 Diarrhea, unspecified: Secondary | ICD-10-CM | POA: Diagnosis present

## 2013-03-25 DIAGNOSIS — I10 Essential (primary) hypertension: Secondary | ICD-10-CM

## 2013-03-25 DIAGNOSIS — R791 Abnormal coagulation profile: Secondary | ICD-10-CM

## 2013-03-25 LAB — BASIC METABOLIC PANEL
BUN: 27 mg/dL — ABNORMAL HIGH (ref 6–23)
CO2: 21 mEq/L (ref 19–32)
Calcium: 8.5 mg/dL (ref 8.4–10.5)
Creatinine, Ser: 1.12 mg/dL — ABNORMAL HIGH (ref 0.50–1.10)
Glucose, Bld: 98 mg/dL (ref 70–99)
Sodium: 139 mEq/L (ref 135–145)

## 2013-03-25 LAB — CBC
MCH: 24.9 pg — ABNORMAL LOW (ref 26.0–34.0)
MCHC: 31.7 g/dL (ref 30.0–36.0)
MCV: 78.7 fL (ref 78.0–100.0)
Platelets: 124 10*3/uL — ABNORMAL LOW (ref 150–400)
RBC: 4.69 MIL/uL (ref 3.87–5.11)
RDW: 17.4 % — ABNORMAL HIGH (ref 11.5–15.5)
WBC: 7.8 10*3/uL (ref 4.0–10.5)

## 2013-03-25 LAB — PROTIME-INR
INR: 6.41 (ref 0.00–1.49)
Prothrombin Time: 53.7 seconds — ABNORMAL HIGH (ref 11.6–15.2)

## 2013-03-25 LAB — OVA AND PARASITE EXAMINATION
Ova and parasites: NONE SEEN
Special Requests: NORMAL

## 2013-03-25 MED ORDER — VITAMIN K1 1 MG/0.5ML IJ SOLN
5.0000 mg | Freq: Once | INTRAMUSCULAR | Status: DC
  Filled 2013-03-25: qty 2.5

## 2013-03-25 MED ORDER — LOPERAMIDE HCL 2 MG PO CAPS
2.0000 mg | ORAL_CAPSULE | ORAL | Status: DC | PRN
  Filled 2013-03-25: qty 1

## 2013-03-25 MED ORDER — LOPERAMIDE HCL 2 MG PO CAPS
4.0000 mg | ORAL_CAPSULE | Freq: Once | ORAL | Status: AC
  Administered 2013-03-25: 4 mg via ORAL
  Filled 2013-03-25: qty 2

## 2013-03-25 MED ORDER — VITAMIN K1 10 MG/ML IJ SOLN: 5.0000 mg | Freq: Once | INTRAVENOUS | Status: DC

## 2013-03-25 MED ORDER — VITAMIN K1 10 MG/ML IJ SOLN
5.0000 mg | Freq: Once | INTRAMUSCULAR | Status: AC
  Administered 2013-03-25: 5 mg via SUBCUTANEOUS
  Filled 2013-03-25: qty 0.5

## 2013-03-25 NOTE — Progress Notes (Addendum)
CRITICAL VALUE ALERT   Critical value received:  INR=6.41  Date of notification:  03/25/2013  Time of notification:  1418  Critical value read back:yes  Nurse who received alert:  Marlyne Beards  MD notified (1st page):  Janee Morn  Time of first page:  1420  MD notified (2nd page):  Time of second page:  Responding MD:  Janee Morn  Time MD responded:  [phone]

## 2013-03-25 NOTE — Progress Notes (Signed)
ANTICOAGULATION CONSULT NOTE   Pharmacy Consult for Warfarin Indication: atrial fibrillation  Allergies  Allergen Reactions  . Ivp Dye [Iodinated Diagnostic Agents] Itching  . Nitrofurantoin Itching  . Ramipril     unknown    Patient Measurements: Height: 5\' 4"  (162.6 cm) Weight: 172 lb 4.8 oz (78.155 kg) IBW/kg (Calculated) : 54.7  Vital Signs: Temp: 98.4 F (36.9 C) (11/05 0639) Temp src: Oral (11/05 0639) BP: 126/73 mmHg (11/05 0639) Pulse Rate: 69 (11/05 0639)  Labs:  Recent Labs  03/24/13 1225 03/25/13 0535  HGB 12.4 11.7*  HCT 38.0 36.9  PLT 128* 124*  LABPROT 54.4* 73.6*  INR 6.52* 9.69*  CREATININE 1.63* 1.12*    Estimated Creatinine Clearance: 38.5 ml/min (by C-G formula based on Cr of 1.12).   Medical History: Past Medical History  Diagnosis Date  . CAD (coronary artery disease)   . Mitral valve disorder   . HTN (hypertension)   . Dyslipidemia   . Aortic stenosis, mild   . Breast cyst   . Hypertrophic cardiomyopathy   . Sleep apnea   . COPD (chronic obstructive pulmonary disease)     "a touch of"; prn o2 at home with activity   . Atrial fibrillation   . Hypothyroidism   . Anemia     Medications:  Home dose: Warfarin 3.5mg  daily (2.5mg  + 1mg ), last dose 11/3  Assessment: 26 yoF admitted with nausea, vomiting, and diarrhea.  PMHx significant for CAD, HTN, HLD, COPD, hypothyroidism, anemia, AS, and atrial fibrilliation with mechanical MVR on warfarin (higher INR goal) s/p successful cardioversion 1 month ago.   INR on admission = 6.52 (goal 2.5-3.5)  INR today = 9.69  Vitamin K 5mg  SQ ordered and given this am  CBC: Plts 124, hgb = 11.7, no bleeding noted  Clear liquid diet  Goal of Therapy:  INR 2.5-3.5 Monitor platelets by anticoagulation protocol: Yes   Plan:  Continue to Hold Coumadin for SUPRAtherapeutic INR (expect INR elevation d/t N/V/D with poor PO/Vit K intake) Daily PT/INR (s/p Vit K 11/5)  Juliette Alcide, PharmD,  BCPS.   Pager: [phone] 03/25/2013 8:29 AM

## 2013-03-25 NOTE — Progress Notes (Signed)
CRITICAL VALUE ALERT  Critical value received:  iINR 9.69, PT 73.6 Date of notification:03/25/13  Time of notification:  0630 Critical value read back:YES  Nurse who received alert: MDeBREW,RN MD notified (1st page): [facility]  Time of first page: notified (2nd page):  Time of second page:  Responding MD: Zachary George Time MD responded:  [phone]

## 2013-03-25 NOTE — Progress Notes (Signed)
[facility] PROGRESS NOTE  Patricia [name] NFA:[id number] DOB: Mar 31, 1927 DOA: 03/24/2013 PCP: Londell Moh, MD  Assessment/Plan: #1 diarrhea Patient states has been having loose stools for the past 10 days with no significant improvement. Patient stated a year ago was diagnosed with Giardia. Patient denies any blood in the stools. Patient states had a colonoscopy a year ago which was negative. Patient stated to go bottle of Imodium day of admission however no significant improvement. Patient still with loose stools watery in nature. Patient denies any abdominal pain. C. difficile PCR was negative. Stool studies including ova and parasites pending. Continue IV fluids, supportive care. Will place on Imodium as needed. Advance to full liquid diet and if tolerated to heart healthy diet. If no improvement will likely need a GI consultation.  #2 supratherapeutic INR INR this morning is 9.69. Patient was given 5 mg IV of vitamin K. Patient with no signs of bleeding. Repeat PT/INR this afternoon. Continue to hold Coumadin. Follow.  #3 acute renal failure Likely secondary to prerenal azotemia secondary to dehydration. Renal function improving on IV fluids. ACE inhibitor/ARB on hold. Urinalysis is negative. Follow.  #4 hypokalemia Repleted.  #5 dehydration IV fluids.  #6 coronary artery disease/HOCM with mild subaortic stenosis/ Stable. There are be on hold secondary to acute renal failure.  #7 hypertension/hyperlipidemia Blood pressure is stable. There are be on hold.  #8 atrial fibrillation status post successful cardioversion a month ago Continue amiodarone. Coumadin on hold secondary to supratherapeutic INR. Follow.  #9 mitral valve regurgitation with bioprosthetic valve Stable. Coumadin on hold secondary to supratherapeutic INR.  #10 COPD Stable.  #11 hypothyroidism TSH within normal limits. Continue Synthroid.  #12 mild thrombocytopenia Stable. No signs of bleeding.  Follow.  #13 obstructive sleep apnea CPAP each bedtime.  #14 prophylaxis INR is supratherapeutic.  Code Status: DO NOT RESUSCITATE Family Communication: Updated patient, daughter and family at bedside. Disposition Plan: Home when medically stable.   Consultants:  None  Procedures:  None  Antibiotics:  None  HPI/Subjective: Patient denies any emesis. Patient states nausea is improved. Patient tolerating clear liquids. Patient states had 3 loose stools this morning.  Objective: Filed Vitals:   03/25/13 0639  BP: 126/73  Pulse: 69  Temp: 98.4 F (36.9 C)  Resp: 18    Intake/Output Summary (Last 24 hours) at 03/25/13 1112 Last data filed at 03/25/13 0900  Gross per 24 hour  Intake    320 ml  Output    401 ml  Net    -81 ml   Filed Weights   03/24/13 1430 03/25/13 0639  Weight: 77.111 kg (170 lb) 78.155 kg (172 lb 4.8 oz)    Exam:   General:  NAD  Cardiovascular: RRR with 3/6 SEM  Respiratory: CTAB  Abdomen: Soft, nontender, nondistended, positive bowel sounds.  Musculoskeletal: No clubbing cyanosis or edema.   Data Reviewed: Basic Metabolic Panel:  Recent Labs Lab 03/24/13 1225 03/25/13 0535  NA 137 139  K 3.2* 3.7  CL 104 109  CO2 20 21  GLUCOSE 110* 98  BUN 41* 27*  CREATININE 1.63* 1.12*  CALCIUM 9.3 8.5   Liver Function Tests:  Recent Labs Lab 03/24/13 1225  AST 23  ALT 14  ALKPHOS 93  BILITOT 0.5  PROT 7.1  ALBUMIN 3.4*    Recent Labs Lab 03/24/13 1225  LIPASE 88*   No results found for this basename: AMMONIA,  in the last 168 hours CBC:  Recent Labs Lab 03/24/13 1225 03/25/13 0535  WBC 9.2 7.8  NEUTROABS 7.6  --   HGB 12.4 11.7*  HCT 38.0 36.9  MCV 77.1* 78.7  PLT 128* 124*   Cardiac Enzymes: No results found for this basename: CKTOTAL, CKMB, CKMBINDEX, TROPONINI,  in the last 168 hours BNP (last 3 results)  Recent Labs  11/28/12 1325  PROBNP 749.0*   CBG: No results found for this basename:  GLUCAP,  in the last 168 hours  Recent Results (from the past 240 hour(s))  STOOL CULTURE     Status: None   Collection Time    03/24/13  4:52 PM      Result Value Range Status   Specimen Description STOOL   Final   Special Requests Normal   Final   Culture     Final   Value: Culture reincubated for better growth     Performed at [facility]   Report Status PENDING   Incomplete  CLOSTRIDIUM DIFFICILE BY PCR     Status: None   Collection Time    03/24/13  4:52 PM      Result Value Range Status   C difficile by pcr NEGATIVE  NEGATIVE Final   Comment: Performed at [facility]     Studies: No results found.  Scheduled Meds: . amiodarone  100 mg Oral Daily  . levothyroxine  62.5 mcg Oral QAC breakfast  . loperamide  4 mg Oral Once  . sertraline  100 mg Oral Daily  . Warfarin - Pharmacist Dosing Inpatient   Does not apply q1800   Continuous Infusions: . dextrose 5 % and 0.45 % NaCl with KCl 20 mEq/L 100 mL/hr (03/25/13 0215)    Principal Problem:   Diarrhea Active Problems:   HYPERTENSION, UNSPECIFIED   CAD   Mitral valve disorders   Hypertrophic obstructive cardiomyopathy(425.11)   ATRIAL FIBRILLATION, HX OF   COPD, mild   OSA (obstructive sleep apnea)   Chronic diarrhea   Dehydration   AKI (acute kidney injury)   Thrombocytopenia, unspecified    Time spent: 35 mins    [facility]  [facility] Pager [phone]. If 7PM-7AM, please contact night-coverage at [email or web address], password [facility] 03/25/2013, 11:12 AM  LOS: 1 day

## 2013-03-26 DIAGNOSIS — E43 Unspecified severe protein-calorie malnutrition: Secondary | ICD-10-CM | POA: Insufficient documentation

## 2013-03-26 DIAGNOSIS — Z7901 Long term (current) use of anticoagulants: Secondary | ICD-10-CM

## 2013-03-26 LAB — BASIC METABOLIC PANEL
Chloride: 112 mEq/L (ref 96–112)
GFR calc Af Amer: 62 mL/min — ABNORMAL LOW (ref >90)
GFR calc non Af Amer: 53 mL/min — ABNORMAL LOW (ref >90)
Glucose, Bld: 99 mg/dL (ref 70–99)
Potassium: 4.3 mEq/L (ref 3.5–5.1)
Sodium: 139 mEq/L (ref 135–145)

## 2013-03-26 LAB — PROTIME-INR: INR: 1.99 — ABNORMAL HIGH (ref 0.00–1.49)

## 2013-03-26 LAB — CBC
Hemoglobin: 11.1 g/dL — ABNORMAL LOW (ref 12.0–15.0)
MCH: 25.3 pg — ABNORMAL LOW (ref 26.0–34.0)
RBC: 4.39 MIL/uL (ref 3.87–5.11)
WBC: 6.1 10*3/uL (ref 4.0–10.5)

## 2013-03-26 LAB — OVA AND PARASITE EXAMINATION: Special Requests: NORMAL

## 2013-03-26 MED ORDER — SODIUM CHLORIDE 0.9 % IV SOLN
INTRAVENOUS | Status: DC
  Administered 2013-03-26: 16:00:00 via INTRAVENOUS

## 2013-03-26 MED ORDER — WARFARIN SODIUM 1 MG PO TABS
1.0000 mg | ORAL_TABLET | Freq: Once | ORAL | Status: AC
  Administered 2013-03-26: 1 mg via ORAL
  Filled 2013-03-26: qty 1

## 2013-03-26 NOTE — Progress Notes (Addendum)
Spoke with pt regarding cpap.  Pt brought in her cpap unit with nasal mask from home.  Pt stated she feels comfortable placing it on herself later when ready.  Pt was advised that RT is available all night should she need further assistance.  RN notified.  Sterile water was added to humidity chamber.  Cpap turns on without difficulty.  No frays on cord or obvious defects noted at this time.

## 2013-03-26 NOTE — Progress Notes (Signed)
INITIAL NUTRITION ASSESSMENT  Pt meets criteria for severe MALNUTRITION in the context of acute illness as evidenced by <50% estimated energy intake with 5.4% weight loss in the past week per pt report.  DOCUMENTATION CODES Per approved criteria  -Severe malnutrition in the context of acute illness or injury   INTERVENTION: - Encouraged pt to continue to eat well - Will continue to monitor   NUTRITION DIAGNOSIS: Unintended weight loss related to nausea, vomiting, diarrhea as evidenced by pt report.   Goal: 1. Pt to consume >90% of meals 2. No further weight loss  Monitor:  Weights, labs, intake  Reason for Assessment: Nutrition risk   79 y.o. female  Admitting Dx: Diarrhea  ASSESSMENT: Pt with history of CAD, HTN, HLD, COPD, hypothyroidism, anemia who presents with nausea, vomiting, diarrhea. States she was having 9 days of watery diarrhea and was eating only chicken noodle soup, and crackers - reports 10 pound unintended weight loss during this time frame. States before then she was eating 2-3 meals/day. Had 3 loose stools yesterday, 1 more formed BM today, denies any nausea/vomiting. On heart healthy diet, eating well, 95% of meals. Performed nutrition physical exam on upper body which was all WNL.   Height: Ht Readings from Last 1 Encounters:  03/24/13 5\' 4"  (1.626 m)    Weight: Wt Readings from Last 1 Encounters:  03/26/13 174 lb 14.4 oz (79.334 kg)    Ideal Body Weight: 120 lb   % Ideal Body Weight: 145%  Wt Readings from Last 10 Encounters:  03/26/13 174 lb 14.4 oz (79.334 kg)  01/07/13 183 lb 6.4 oz (83.19 kg)  11/28/12 176 lb (79.833 kg)  03/25/12 183 lb 3.2 oz (83.099 kg)  01/31/12 184 lb (83.462 kg)  01/31/12 184 lb (83.462 kg)  01/17/12 188 lb (85.276 kg)  01/10/12 189 lb 12.8 oz (86.093 kg)  11/26/11 193 lb (87.544 kg)  10/24/11 187 lb (84.823 kg)    Usual Body Weight: 184 lb per pt  % Usual Body Weight: 94%  BMI:  Body mass index is 30.01  kg/(m^2). Class I obesity  Estimated Nutritional Needs: Kcal: 1300-1500 Protein: 55-65g Fluid: 1.3-1.5L/day  Skin: Intact   Diet Order: Cardiac  EDUCATION NEEDS: -No education needs identified at this time   Intake/Output Summary (Last 24 hours) at 03/26/13 1315 Last data filed at 03/26/13 1100  Gross per 24 hour  Intake   4290 ml  Output      0 ml  Net   4290 ml    Last BM: 11/5  Labs:   Recent Labs Lab 03/24/13 1225 03/25/13 0535 03/26/13 0540  NA 137 139 139  K 3.2* 3.7 4.3  CL 104 109 112  CO2 20 21 22   BUN 41* 27* 17  CREATININE 1.63* 1.12* 0.96  CALCIUM 9.3 8.5 8.0*  GLUCOSE 110* 98 99    CBG (last 3)  No results found for this basename: GLUCAP,  in the last 72 hours  Scheduled Meds: . amiodarone  100 mg Oral Daily  . levothyroxine  62.5 mcg Oral QAC breakfast  . sertraline  100 mg Oral Daily  . Warfarin - Pharmacist Dosing Inpatient   Does not apply q1800    Continuous Infusions: . dextrose 5 % and 0.45 % NaCl with KCl 20 mEq/L 100 mL/hr at 03/26/13 0825    Past Medical History  Diagnosis Date  . CAD (coronary artery disease)   . Mitral valve disorder   . HTN (hypertension)   .  Dyslipidemia   . Aortic stenosis, mild   . Breast cyst   . Hypertrophic cardiomyopathy   . Sleep apnea   . COPD (chronic obstructive pulmonary disease)     "a touch of"; prn o2 at home with activity   . Atrial fibrillation   . Hypothyroidism   . Anemia     Past Surgical History  Procedure Laterality Date  . Laparoscopic cholecystectomy  2009  . Cataract extraction  2003    bilateral  . Mitral valve replacement  2007  . Neck mass excision  2003    benign  . Colonoscopy    . Broken wrist      left, metal implanted   . Broken ankle      right, metal implanted   . Cardiac catheterization  06/2011  . Breast surgery      bilateral cysts x3 removed, benign   . Tee without cardioversion  01/31/2012    Procedure: TRANSESOPHAGEAL ECHOCARDIOGRAM (TEE);   Surgeon: Pricilla Riffle, MD;  Location: [facility] ENDOSCOPY;  Service: Cardiovascular;  Laterality: N/A;  . Cardioversion N/A 01/16/2013    Procedure: CARDIOVERSION;  Surgeon: Vesta Mixer, MD;  Location: [facility] ENDOSCOPY;  Service: Cardiovascular;  Laterality: N/A;    Levon Hedger MS, RD, LDN [phone] Pager [phone] After Hours Pager

## 2013-03-26 NOTE — Evaluation (Signed)
Physical Therapy Evaluation Patient Details Name: Patricia [name] MRN: [id number] DOB: 1926-11-27 Today's Date: 03/26/2013 Time: 6213-0865 PT Time Calculation (min): 25 min  PT Assessment / Plan / Recommendation History of Present Illness  The patient is a 79 y.o. year-old female with history of CAD, HTN, HLD, AS, MV, COPD, hypothyroidism, anemia who presents with nausea, vomiting, diarrhea.  The patient was last at their baseline health approximately 9 days ago.  The patient states that she has had problems with intermittent watery diarrhea for the last several years.  She was diagnosed with giardia about 1-2 years ago and her diarrhea temporarily improved and she was tested for cure and was negative.  The source was never identified.  She uses city water.  She states her bowels never returned to normal after that time and intermittently she develops watery stool which lasts for a few days and resolves with imodium, occasionally associated with nausea and vomiting.  She has had watery diarrhea 4-6 times per day with stool incontinence for the last 9 days with vomiting about once per day, nonbilious, nonbloody.   Clinical Impression  Pt presents with decreased cardiopulmonary endurance, decreased balance, and decreased mobility.  Tolerated ambulation in hallway x 150' without RW initially with min assist to prevent overt LOB and transitioned to RW with marked improvement noted.  Recommend that pt use RW at home upon D/C to reduce risk of fall.  Also recommend HHPT for follow up to increase balance, strength and endurance and also to monitor appropriate supplemental oxygen use at home.  Feel pt will need to use supplemental O2 at all times, or at least during all mobility due to desaturation.  See O2 note below.  SATURATION QUALIFICATIONS: (This note is used to comply with regulatory documentation for home oxygen)  Patient Saturations on Room Air at Rest = 92%  Patient Saturations on Room Air while  Ambulating = 84-87%  Patient Saturations on 2 Liters of oxygen while Ambulating = 88-95%  Please briefly explain why patient needs home oxygen:  Pt has oxygen at home currently, however she only uses "when needed."  Educated on importance of maintaining O2 sats above 90%. She states she has pulseoximeter at home to check and applies O2 when <90%.  Feel she will need O2 at all times, esp when out in community due to desaturation.  Feel that if she gets more portable tank, will likely increase compliance.      PT Assessment  Patient needs continued PT services    Follow Up Recommendations  Home health PT    Does the patient have the potential to tolerate intense rehabilitation      Barriers to Discharge        Equipment Recommendations  Rolling walker with 5" wheels    Recommendations for Other Services OT consult   Frequency Min 3X/week    Precautions / Restrictions Precautions Precautions: Fall Precaution Comments: monitor O2, needs supplemental O2 during amb Restrictions Weight Bearing Restrictions: No   Pertinent Vitals/Pain No pain      Mobility  Bed Mobility Bed Mobility: Not assessed Details for Bed Mobility Assistance: Pt sitting in recliner when PT arrived.  Transfers Transfers: Sit to Stand;Stand to Sit Sit to Stand: 5: Supervision;With upper extremity assist;With armrests;From chair/3-in-1 Stand to Sit: 5: Supervision;With upper extremity assist;To chair/3-in-1 Details for Transfer Assistance: Supervision for safety with min cues for hand placement and controlled descent when sitting.  Ambulation/Gait Ambulation/Gait Assistance: 4: Min assist (Min assist without AD, supervision  with RW) Ambulation Distance (Feet): 150 Feet (x 2 reps) Assistive device: Rolling walker (and no AD) Ambulation/Gait Assistance Details: Initially performed gait assessment without AD and noted pt to be unbalanced with narrow BOS, decreased weight shift R and L with near LOB to L x 2.   Transitioned to gait training with RW with marked improvement in balance noted, as well as energy conservation.  Min cues for maintaining position inside of RW, esp with turns.  Ambulated with and without O2, see sats in note.  Gait Pattern: Step-through pattern;Decreased stride length;Decreased weight shift to right;Decreased weight shift to left;Trunk flexed;Narrow base of support Stairs: No    Exercises     PT Diagnosis: Difficulty walking;Generalized weakness  PT Problem List: Decreased strength;Decreased activity tolerance;Decreased balance;Decreased mobility;Decreased coordination;Decreased knowledge of use of DME;Decreased safety awareness;Decreased knowledge of precautions;Cardiopulmonary status limiting activity PT Treatment Interventions: DME instruction;Gait training;Stair training;Functional mobility training;Therapeutic activities;Therapeutic exercise;Balance training;Cognitive remediation     PT Goals(Current goals can be found in the care plan section) Acute Rehab PT Goals Patient Stated Goal: to return home PT Goal Formulation: With patient Time For Goal Achievement: 04/02/13 Potential to Achieve Goals: Good  Visit Information  Last PT Received On: 03/26/13 Assistance Needed: +1 History of Present Illness: The patient is a 79 y.o. year-old female with history of CAD, HTN, HLD, AS, MV, COPD, hypothyroidism, anemia who presents with nausea, vomiting, diarrhea.  The patient was last at their baseline health approximately 9 days ago.  The patient states that she has had problems with intermittent watery diarrhea for the last several years.  She was diagnosed with giardia about 1-2 years ago and her diarrhea temporarily improved and she was tested for cure and was negative.  The source was never identified.  She uses city water.  She states her bowels never returned to normal after that time and intermittently she develops watery stool which lasts for a few days and resolves with  imodium, occasionally associated with nausea and vomiting.  She has had watery diarrhea 4-6 times per day with stool incontinence for the last 9 days with vomiting about once per day, nonbilious, nonbloody.        Prior Functioning  Home Living Family/patient expects to be discharged to:: Private residence Living Arrangements: Spouse/significant other Available Help at Discharge: Family;Available 24 hours/day Type of Home: House Home Access: Stairs to enter [employer] of Steps: 2 Entrance Stairs-Rails: Right;Left;Can reach both Home Layout: One level Home Equipment: Shower seat Additional Comments: has shower seat, but does not use Prior Function Level of Independence: Independent Communication Communication: No difficulties    Cognition  Cognition Arousal/Alertness: Awake/alert Behavior During Therapy: WFL for tasks assessed/performed Overall Cognitive Status: Within Functional Limits for tasks assessed    Extremity/Trunk Assessment Lower Extremity Assessment Lower Extremity Assessment: Generalized weakness Cervical / Trunk Assessment Cervical / Trunk Assessment: Normal   Balance    End of Session PT - End of Session Activity Tolerance: Patient tolerated treatment well Patient left: in chair;with call bell/phone within reach Nurse Communication: Mobility status (O2 sats)  GP     Vista Deck 03/26/2013, 3:35 PM

## 2013-03-26 NOTE — Progress Notes (Signed)
ANTICOAGULATION CONSULT NOTE - Follow up Consult  Pharmacy Consult for Warfarin Indication: atrial fibrillation with mechanical MVR  Allergies  Allergen Reactions  . Ivp Dye [Iodinated Diagnostic Agents] Itching  . Nitrofurantoin Itching  . Ramipril     unknown    Patient Measurements: Height: 5\' 4"  (162.6 cm) Weight: 174 lb 14.4 oz (79.334 kg) IBW/kg (Calculated) : 54.7  Vital Signs: Temp: 98.4 F (36.9 C) (11/06 0600) Temp src: Oral (11/06 0600) BP: 132/76 mmHg (11/06 0600) Pulse Rate: 68 (11/06 0600)  Labs:  Recent Labs  03/24/13 1225 03/25/13 0535 03/25/13 1343 03/26/13 0540  HGB 12.4 11.7*  --  11.1*  HCT 38.0 36.9  --  34.5*  PLT 128* 124*  --  101*  LABPROT 54.4* 73.6* 53.7* 22.0*  INR 6.52* 9.69* 6.41* 1.99*  CREATININE 1.63* 1.12*  --  0.96    Estimated Creatinine Clearance: 45.2 ml/min (by C-G formula based on Cr of 0.96).   Medications:  Scheduled:  . amiodarone  100 mg Oral Daily  . levothyroxine  62.5 mcg Oral QAC breakfast  . sertraline  100 mg Oral Daily  . Warfarin - Pharmacist Dosing Inpatient   Does not apply q1800   Infusions:  . dextrose 5 % and 0.45 % NaCl with KCl 20 mEq/L 100 mL/hr at 03/25/13 2144    Assessment: 85 yoF admitted with nausea, vomiting, and diarrhea. PMHx significant for CAD, HTN, HLD, COPD, hypothyroidism, anemia, AS, and atrial fibrilliation with mechanical MVR on warfarin (higher INR goal) s/p successful cardioversion 1 month ago.   Prior to admission dose was warfarin 3.5mg  daily with last dose on 11/3  INR increased from 6.52 on admission to > 9 yesterday; given Vitamin K 5mg  SQ x1 dose on 03/25/13  INR =1.99 today and has now decreased to subtherapeutic level.  Resume low dose warfarin today.  Anticipate that vitamin K will cause warfarin resistance and suppress INR for several days.  CBC: Plts 101, hgb = 11.1, no bleeding noted   Noted potential drug-drug interaction with amiodarone, but pt was on this  combination PTA  Diet:  Heart healthy diet advanced from clear liquids.  Rn reports she is eating 95% of meals.    Goal of Therapy:  INR 2.5-3.5 Monitor platelets by anticoagulation protocol: Yes   Plan:   Warfarin 1mg  PO today at 1800 x1 dose  Daily INR  Pharmacy to f/u daily.   Lynann Beaver PharmD, BCPS Pager [phone] 03/26/2013 7:35 AM

## 2013-03-26 NOTE — Progress Notes (Signed)
[facility] PROGRESS NOTE  Patricia [name] AVW:[id number] DOB: 10-24-1926 DOA: 03/24/2013 PCP: Londell Moh, MD  Assessment/Plan: #1 diarrhea Patient states has been having loose stools for the past 10 days with no significant improvement. Patient stated a year ago was diagnosed with Giardia. Patient denies any blood in the stools. Patient states had a colonoscopy a year ago which was negative. Patient stated took a bottle of Imodium on day of admission however no significant improvement. Patient with improvement in stools. Stools are more formed. Patient denies any abdominal pain. C. difficile PCR was negative. Ova and parasites are negative.  Continue IV fluids, supportive care. Continue Imodium as needed. Tolerating current diet. Will likely need outpatient GI followup.   #2 supratherapeutic INR INR this morning is 1.99 from 9.69. Patient was given 5 mg IV of vitamin K yesterday. Patient with no signs of bleeding. Resume Coumadin. Follow.  #3 acute renal failure Likely secondary to prerenal azotemia secondary to dehydration. Renal function improving on IV fluids. ACE inhibitor/ARB and diuretics on hold. Urinalysis is negative. Follow.  #4 hypokalemia Repleted.  #5 dehydration Decreased IV fluids to 75 cc per hour.  #6 coronary artery disease/HOCM with mild subaortic stenosis/ Stable. Blood pressure medications on hold secondary to acute renal failure.  #7 hypertension/hyperlipidemia Blood pressure is stable. Blood pressure medications on hold.  #8 atrial fibrillation status post successful cardioversion a month ago Continue amiodarone. Resume Coumadin as INR is now 1.99. on hold secondary to supratherapeutic INR. Follow.  #9 mitral valve regurgitation with bioprosthetic valve Stable. Coumadin resumed today.   #10 COPD Stable.  #11 hypothyroidism TSH within normal limits. Continue Synthroid.  #12 mild thrombocytopenia Stable. No signs of bleeding. Follow.  #13  obstructive sleep apnea CPAP each bedtime.  #14 prophylaxis INR is therapeutic..  Code Status: DO NOT RESUSCITATE Family Communication: Updated patient, daughters at bedside. Disposition Plan: Home when medically stable.   Consultants:  None  Procedures:  None  Antibiotics:  None  HPI/Subjective: Patient denies any emesis. Patient states nausea is improved. Patient tolerating regular diet. Patient states stools have improved and more formed.  Objective: Filed Vitals:   03/26/13 1300  BP: 143/81  Pulse: 53  Temp: 98.4 F (36.9 C)  Resp: 18    Intake/Output Summary (Last 24 hours) at 03/26/13 1432 Last data filed at 03/26/13 1100  Gross per 24 hour  Intake   4290 ml  Output      0 ml  Net   4290 ml   Filed Weights   03/24/13 1430 03/25/13 0639 03/26/13 0600  Weight: 77.111 kg (170 lb) 78.155 kg (172 lb 4.8 oz) 79.334 kg (174 lb 14.4 oz)    Exam:   General:  NAD  Cardiovascular: RRR with 3/6 SEM  Respiratory: CTAB  Abdomen: Soft, nontender, nondistended, positive bowel sounds.  Musculoskeletal: No clubbing cyanosis or edema.   Data Reviewed: Basic Metabolic Panel:  Recent Labs Lab 03/24/13 1225 03/25/13 0535 03/26/13 0540  NA 137 139 139  K 3.2* 3.7 4.3  CL 104 109 112  CO2 20 21 22   GLUCOSE 110* 98 99  BUN 41* 27* 17  CREATININE 1.63* 1.12* 0.96  CALCIUM 9.3 8.5 8.0*   Liver Function Tests:  Recent Labs Lab 03/24/13 1225  AST 23  ALT 14  ALKPHOS 93  BILITOT 0.5  PROT 7.1  ALBUMIN 3.4*    Recent Labs Lab 03/24/13 1225  LIPASE 88*   No results found for this basename: AMMONIA,  in the  last 168 hours CBC:  Recent Labs Lab 03/24/13 1225 03/25/13 0535 03/26/13 0540  WBC 9.2 7.8 6.1  NEUTROABS 7.6  --   --   HGB 12.4 11.7* 11.1*  HCT 38.0 36.9 34.5*  MCV 77.1* 78.7 78.6  PLT 128* 124* 101*   Cardiac Enzymes: No results found for this basename: CKTOTAL, CKMB, CKMBINDEX, TROPONINI,  in the last 168 hours BNP (last  3 results)  Recent Labs  11/28/12 1325  PROBNP 749.0*   CBG: No results found for this basename: GLUCAP,  in the last 168 hours  Recent Results (from the past 240 hour(s))  OVA AND PARASITE EXAMINATION     Status: None   Collection Time    03/24/13  4:30 PM      Result Value Range Status   Specimen Description STOOL   Final   Special Requests Normal   Final   Ova and parasites     Final   Value: NO OVA OR PARASITES SEEN     Performed at [facility]   Report Status 03/25/2013 FINAL   Final  STOOL CULTURE     Status: None   Collection Time    03/24/13  4:52 PM      Result Value Range Status   Specimen Description STOOL   Final   Special Requests Normal   Final   Culture     Final   Value: NO SUSPICIOUS COLONIES, CONTINUING TO HOLD     Performed at [facility]   Report Status PENDING   Incomplete  CLOSTRIDIUM DIFFICILE BY PCR     Status: None   Collection Time    03/24/13  4:52 PM      Result Value Range Status   C difficile by pcr NEGATIVE  NEGATIVE Final   Comment: Performed at [facility]     Studies: No results found.  Scheduled Meds: . amiodarone  100 mg Oral Daily  . levothyroxine  62.5 mcg Oral QAC breakfast  . sertraline  100 mg Oral Daily  . Warfarin - Pharmacist Dosing Inpatient   Does not apply q1800   Continuous Infusions: . dextrose 5 % and 0.45 % NaCl with KCl 20 mEq/L 100 mL/hr at 03/26/13 0825    Principal Problem:   Diarrhea Active Problems:   HYPERTENSION, UNSPECIFIED   CAD   Mitral valve disorders   Hypertrophic obstructive cardiomyopathy(425.11)   ATRIAL FIBRILLATION, HX OF   COPD, mild   OSA (obstructive sleep apnea)   Chronic diarrhea   Dehydration   AKI (acute kidney injury)   Thrombocytopenia, unspecified    Time spent: 35 mins    [facility]  [facility] Pager [phone]. If 7PM-7AM, please contact night-coverage at [email or web address], password [facility] 03/26/2013, 2:32 PM  LOS: 2 days

## 2013-03-27 LAB — BASIC METABOLIC PANEL
CO2: 22 mEq/L (ref 19–32)
Calcium: 8.6 mg/dL (ref 8.4–10.5)
Creatinine, Ser: 1.06 mg/dL (ref 0.50–1.10)
GFR calc non Af Amer: 47 mL/min — ABNORMAL LOW (ref >90)
Sodium: 137 mEq/L (ref 135–145)

## 2013-03-27 LAB — PROTIME-INR
INR: 1.29 (ref 0.00–1.49)
Prothrombin Time: 15.8 seconds — ABNORMAL HIGH (ref 11.6–15.2)

## 2013-03-27 LAB — OVA AND PARASITE EXAMINATION: Ova and parasites: NONE SEEN

## 2013-03-27 MED ORDER — WARFARIN SODIUM 2 MG PO TABS
2.0000 mg | ORAL_TABLET | Freq: Once | ORAL | Status: DC
  Filled 2013-03-27: qty 1

## 2013-03-27 MED ORDER — WARFARIN SODIUM 3 MG PO TABS
3.0000 mg | ORAL_TABLET | Freq: Once | ORAL | Status: AC
  Administered 2013-03-27: 3 mg via ORAL
  Filled 2013-03-27: qty 1

## 2013-03-27 MED ORDER — LOPERAMIDE HCL 2 MG PO CAPS: 2.0000 mg | ORAL_CAPSULE | ORAL | Status: DC | PRN

## 2013-03-27 MED ORDER — WARFARIN SODIUM 1 MG PO TABS: 3.0000 mg | ORAL_TABLET | Freq: Every day | ORAL | Status: DC

## 2013-03-27 NOTE — Progress Notes (Signed)
CARE MANAGEMENT NOTE 03/27/2013  Patient:  Patricia [name], Patricia [name]   Account Number:  [id number]  Date Initiated:  03/27/2013  Documentation initiated by:  [facility]  Subjective/Objective Assessment:   79 year old female admitted with diarrhea.     Action/Plan:   From home, needs HH services.   Anticipated DC Date:  03/30/2013   Anticipated DC Plan:  HOME W HOME HEALTH SERVICES      DC Planning Services  CM consult      Choice offered to / List presented to:  C-4 Adult Children   DME arranged  Levan Hurst      DME agency  [facility]     HH arranged  HH-2 PT  HH-3 OT      [facility] agency  [facility]   Status of service:  In process, will continue to follow  Medicare Important Message given?  NA - LOS <3 / Initial given by admissions  Per UR Regulation:  Reviewed for med. necessity/level of care/duration of stay  Comments:  03/27/13 Algernon Huxley RN BSN [phone] Pt's daughter chose [facility] for [facility] services. Referral made. Rolling walker will be delivered by [facility].

## 2013-03-27 NOTE — Plan of Care (Signed)
Problem: Phase III Progression Outcomes Goal: Pain controlled on oral analgesia Outcome: Not Applicable Date Met:  03/27/13 Pt has had no pain

## 2013-03-27 NOTE — Progress Notes (Addendum)
ANTICOAGULATION CONSULT NOTE - Follow up Consult  Pharmacy Consult for Warfarin, Lovenox Indication: atrial fibrillation with bioprosthetic MVR  Allergies  Allergen Reactions  . Ivp Dye [Iodinated Diagnostic Agents] Itching  . Nitrofurantoin Itching  . Ramipril     unknown    Patient Measurements: Height: 5\' 4"  (162.6 cm) Weight: 173 lb 14.4 oz (78.881 kg) IBW/kg (Calculated) : 54.7  Vital Signs: Temp: 98.6 F (37 C) (11/07 0545) Temp src: Oral (11/07 0545) BP: 142/68 mmHg (11/07 0545) Pulse Rate: 69 (11/07 0545)  Labs:  Recent Labs  03/24/13 1225 03/25/13 0535 03/25/13 1343 03/26/13 0540 03/27/13 0538  HGB 12.4 11.7*  --  11.1*  --   HCT 38.0 36.9  --  34.5*  --   PLT 128* 124*  --  101*  --   LABPROT 54.4* 73.6* 53.7* 22.0* 15.8*  INR 6.52* 9.69* 6.41* 1.99* 1.29  CREATININE 1.63* 1.12*  --  0.96 1.06    Estimated Creatinine Clearance: 40.9 ml/min (by C-G formula based on Cr of 1.06).   Medications:  Scheduled:  . amiodarone  100 mg Oral Daily  . levothyroxine  62.5 mcg Oral QAC breakfast  . sertraline  100 mg Oral Daily  . Warfarin - Pharmacist Dosing Inpatient   Does not apply q1800   Infusions:  . sodium chloride 75 mL/hr at 03/26/13 1607    Assessment: 85 yoF admitted with nausea, vomiting, and diarrhea. PMHx significant for CAD, HTN, HLD, COPD, hypothyroidism, anemia, AS, and atrial fibrilliation with bioprosthetic MVR on warfarin s/p successful cardioversion 01/16/13.   Prior to admission dose was warfarin 3.5mg  daily with last dose on 11/3  INR increased from 6.52 on admission to > 9 and was given Vitamin K 5mg  SQ x1 dose on 03/25/13.  Subtherapeutic INR =1.29 today and decreasing  Low dose warfarin resumed 11/6.  Expect that vitamin K administration will cause warfarin resistance and suppress INR for several days.  03/26/13 CBC: Plts 101, hgb = 11.1, no bleeding noted   Noted potential drug-drug interaction with amiodarone, but pt was on  this combination PTA  Diet:  Heart healthy diet and pt is eating 95% of meals.   SCr 1.1 with CrCl ~ 40 ml/min.   Goal of Therapy:  INR 2 - 3  Monitor platelets by anticoagulation protocol: Yes   Plan:   Warfarin 3 mg PO today prior to discharge.  Daily INR  Pharmacy to f/u daily.  Lynann Beaver PharmD, BCPS Pager [phone] 03/27/2013 8:40 AM    Addendum: If discharged, plan to continue warfarin 3mg  daily on Saturday and Sunday.  Recheck INR on Monday, 03/30/13 and adjust dose as needed. No Lovenox bridge necessary per Dr. Tenny Craw d/t bioprosthetic mitral valve and successful cardioversion.  Lynann Beaver PharmD, BCPS Pager [phone] 03/27/2013 1:54 PM

## 2013-03-27 NOTE — Discharge Summary (Signed)
Physician Discharge Summary  Patricia [name] [facility] ZOX:[id number] DOB: 11-Sep-1926 DOA: 03/24/2013  PCP: Londell Moh, MD  Admit date: 03/24/2013 Discharge date: 03/27/2013  Time spent: 65 minutes  Recommendations for Outpatient Follow-up:  1. Follow up with Londell Moh, MD as scheduled. On followup patient need a basic metabolic profile done to followup on electrolytes and renal function. Patient diary also need to be reassessed. 2. Patient is to followup in the Coumadin clinic as scheduled on Monday, 03/30/2013. Patient's INR on admission was supratherapeutic she was given some vitamin K INR on discharge was 1.29. Patient will be discharged on Coumadin 3 mg daily. 3. Patient is to followup with Dr. Elnoria Howard of gastroenterology in one to 2 weeks, for followup on her diarrhea.  Discharge Diagnoses:  Principal Problem:   Diarrhea Active Problems:   HYPERTENSION, UNSPECIFIED   CAD   Mitral valve disorders   Hypertrophic obstructive cardiomyopathy(425.11)   ATRIAL FIBRILLATION, HX OF   COPD, mild   OSA (obstructive sleep apnea)   Chronic diarrhea   Dehydration   AKI (acute kidney injury)   Thrombocytopenia, unspecified   Protein-calorie malnutrition, severe   Discharge Condition: Stable and improved  Diet recommendation: Heart healthy  Filed Weights   03/25/13 0639 03/26/13 0600 03/27/13 0545  Weight: 78.155 kg (172 lb 4.8 oz) 79.334 kg (174 lb 14.4 oz) 78.881 kg (173 lb 14.4 oz)    History of present illness:  The patient is a 79 y.o. year-old female with history of CAD, HTN, HLD, AS, MV, COPD, hypothyroidism, anemia who presents with nausea, vomiting, diarrhea. The patient was last at their baseline health approximately 9 days ago. The patient states that she has had problems with intermittent watery diarrhea for the last several years. She was diagnosed with giardia about 1-2 years ago and her diarrhea temporarily improved and she was tested for cure and was negative. The  source was never identified. She uses city water. She states her bowels never returned to normal after that time and intermittently she develops watery stool which lasts for a few days and resolves with imodium, occasionally associated with nausea and vomiting. She has had watery diarrhea 4-6 times per day with stool incontinence for the last 9 days with vomiting about once per day, nonbilious, nonbloody. She denies blood in her stools. Her stools smell bad but do not float. Denies melena. Denies fevers, chills, and abdominal pain, although she has some cramps occasionally. She was trying to drink gator-ade but was unable to stay hydrated so her PCP recommended she come to the ER.  In the ER, her labs were notable for Na 137, potassium 3.2, BUK 41, creatinine 1.63 elevated from her baseline of 0.8. UA is pending as are lipase and LFTs.      Hospital Course:  #1 diarrhea  Patient stated had been having loose stools for the past 10 days with no significant improvement prior to admission. Patient stated a year ago was diagnosed with Giardia. Patient denies any blood in the stools. Patient states had a colonoscopy a year ago which was negative. Patient stated took a bottle of Imodium on day of admission however no significant improvement. Patient was admitted and placed on IV fluids, supportive care. Stool studies were obtained. C. difficile PCR came back negative. Stool for ova and parasites were negative. Patient was placed on Imodium as needed. Consistency of stool improved her stools were more formed. Patient's diet was advanced did not have any nausea or vomiting and was tolerating a regular  diet. On day of discharge the patient's symptoms had resolved she was back to baseline. It was felt patient's diarrhea was likely secondary to viral gastroenteritis. Patient will followup with PCP and her GI doctor as outpatient.  #2 supratherapeutic INR  ON Admission patient was noted to supratherapeutic INR of 6.52  which further increased to 9.69. Patient's Coumadin was held. Patient was given a dose of 5 mg of IV vitamin K. Patient's INR trended down such that by day of discharge patient's INR was 1.29. Patient's Coumadin has been resumed at 3 mg daily and will followup at the Coumadin clinic as scheduled on Monday, 03/30/2013. It was felt patient supratherapeutic INR was secondary to her diarrhea.  #3 acute renal failure  Likely secondary to prerenal azotemia secondary to dehydration. Patient's ARB was held diuretics were also held. Patient was hydrated with IV fluids. Patient's renal function improved on a daily basis and had resolved by day of discharge. Patient will followup with PCP as outpatient. #4 hypokalemia  Felt to be secondary to GI losses. Repleted.  #5 dehydration  Patient was hydrated with IV fluids and was euvolemic by day of discharge.  #6 coronary artery disease/HOCM with mild subaortic stenosis/  Stable. Blood pressure medications on hold secondary to acute renal failure. Patient's Cozaar was resumed. #7 hypertension/hyperlipidemia  Blood pressure is stable. Blood pressure medications were held secondary to dehydration and acute renal failure. Her blood pressure medications will be resumed on discharge.  #8 atrial fibrillation status post successful cardioversion a month ago  Patient remained in sinus rhythm. Patient was maintained on amiodarone for rate control. On admission patient's Coumadin level/INR was supratherapeutic and a such a Coumadin was held. Patient's son I was reversed with vitamin K as I now went up as high as 9.69. Patient did not have any bleeding episodes. Patient's none trended down such that by day of discharge INR was 1.29. Patient's Coumadin will be resumed at 3 mg daily. Patient will followup in the Coumadin clinic on Monday, 03/30/2013 as previously scheduled.  #9 mitral valve regurgitation with bioprosthetic valve  Stable.  #10 COPD  Stable.  #11 hypothyroidism   TSH within normal limits. Continued on Synthroid.  #12 mild thrombocytopenia  Stable. No signs of bleeding. Follow.  #13 obstructive sleep apnea  CPAP each bedtime.   Procedures:  None  Consultations:  None  Discharge Exam: Filed Vitals:   03/27/13 0900  BP: 123/67  Pulse: 61  Temp:   Resp:     General: NAD Cardiovascular: RRR with 3/6 SEM Respiratory: CTAB  Discharge Instructions      Discharge Orders   Future Orders Complete By Expires   Diet - low sodium heart healthy  As directed    Discharge instructions  As directed    Comments:     Follow up at coumadin clinic on Monday as scheduled. Follow up with Londell Moh, MD as scheduled. Follow up with Dr Elnoria Howard in 1-2 weeks   Increase activity slowly  As directed        Medication List    STOP taking these medications       furosemide 20 MG tablet  Commonly known as:  LASIX      TAKE these medications       amiodarone 200 MG tablet  Commonly known as:  PACERONE  Take 100 mg by mouth daily.     Calcium 600-200 MG-UNIT per tablet  Take 1 tablet by mouth at bedtime.     cholecalciferol  1000 UNITS tablet  Commonly known as:  VITAMIN D  Take 1,000 Units by mouth at bedtime.     COQ10 PO  Take 300 mg by mouth daily.     levothyroxine 125 MCG tablet  Commonly known as:  SYNTHROID, LEVOTHROID  1/2 tab po qd     loperamide 2 MG capsule  Commonly known as:  IMODIUM  Take 2 mg by mouth as needed for diarrhea or loose stools.     losartan 100 MG tablet  Commonly known as:  COZAAR  Take 1 tablet (100 mg total) by mouth daily.     PROBIOTIC DAILY PO  Take 1 tablet by mouth daily.     sertraline 100 MG tablet  Commonly known as:  ZOLOFT  Take 100 mg by mouth daily.     TH FLAX SEED OIL PO  Take 1 capsule by mouth daily.     warfarin 1 MG tablet  Commonly known as:  COUMADIN  Take 3 tablets (3 mg total) by mouth daily at 6 PM. Take 3 tablets ( 3mg ) daily until seen at coumadin  clinic.       Allergies  Allergen Reactions  . Ivp Dye [Iodinated Diagnostic Agents] Itching  . Nitrofurantoin Itching  . Ramipril     unknown   Follow-up Information   Follow up with Londell Moh, MD. (f/u as scheduled)    Specialty:  Internal Medicine   Contact information:   [address] [place] [address] [phone]       Follow up On 03/30/2013. (f/u at coumadin clinic on Monday as scheduled)       Schedule an appointment as soon as possible for a visit with Theda Belfast, MD. (f/u in 1-2 weeks)    Specialty:  Gastroenterology   Contact information:   [address] [place] [address] [phone]        The results of significant diagnostics from this hospitalization (including imaging, microbiology, ancillary and laboratory) are listed below for reference.    Significant Diagnostic Studies: No results found.  Microbiology: Recent Results (from the past 240 hour(s))  OVA AND PARASITE EXAMINATION     Status: None   Collection Time    03/24/13  4:30 PM      Result Value Range Status   Specimen Description STOOL   Final   Special Requests Normal   Final   Ova and parasites     Final   Value: NO OVA OR PARASITES SEEN     Performed at [facility]   Report Status 03/25/2013 FINAL   Final  STOOL CULTURE     Status: None   Collection Time    03/24/13  4:52 PM      Result Value Range Status   Specimen Description STOOL   Final   Special Requests Normal   Final   Culture     Final   Value: NO SUSPICIOUS COLONIES, CONTINUING TO HOLD     Performed at [facility]   Report Status PENDING   Incomplete  CLOSTRIDIUM DIFFICILE BY PCR     Status: None   Collection Time    03/24/13  4:52 PM      Result Value Range Status   C difficile by pcr NEGATIVE  NEGATIVE Final   Comment: Performed at [facility]  OVA AND PARASITE EXAMINATION     Status: None   Collection Time    03/25/13  9:43 AM  Result Value Range Status   Specimen Description STOOL   Final   Special Requests Normal   Final   Ova and parasites     Final   Value: NO OVA OR PARASITES SEEN MODERATE YEAST     Performed at [facility]   Report Status 03/26/2013 FINAL   Final  OVA AND PARASITE EXAMINATION     Status: None   Collection Time    03/26/13 11:20 AM      Result Value Range Status   Specimen Description STOOL   Final   Special Requests Normal   Final   Ova and parasites     Final   Value: NO OVA OR PARASITES SEEN     Performed at [facility]   Report Status 03/27/2013 FINAL   Final     Labs: Basic Metabolic Panel:  Recent Labs Lab 03/24/13 1225 03/25/13 0535 03/26/13 0540 03/27/13 0538  NA 137 139 139 137  K 3.2* 3.7 4.3 4.3  CL 104 109 112 108  CO2 20 21 22 22   GLUCOSE 110* 98 99 91  BUN 41* 27* 17 14  CREATININE 1.63* 1.12* 0.96 1.06  CALCIUM 9.3 8.5 8.0* 8.6   Liver Function Tests:  Recent Labs Lab 03/24/13 1225  AST 23  ALT 14  ALKPHOS 93  BILITOT 0.5  PROT 7.1  ALBUMIN 3.4*    Recent Labs Lab 03/24/13 1225  LIPASE 88*   No results found for this basename: AMMONIA,  in the last 168 hours CBC:  Recent Labs Lab 03/24/13 1225 03/25/13 0535 03/26/13 0540  WBC 9.2 7.8 6.1  NEUTROABS 7.6  --   --   HGB 12.4 11.7* 11.1*  HCT 38.0 36.9 34.5*  MCV 77.1* 78.7 78.6  PLT 128* 124* 101*   Cardiac Enzymes: No results found for this basename: CKTOTAL, CKMB, CKMBINDEX, TROPONINI,  in the last 168 hours BNP: BNP (last 3 results)  Recent Labs  11/28/12 1325  PROBNP 749.0*   CBG: No results found for this basename: GLUCAP,  in the last 168 hours     Signed:  THOMPSON,DANIEL  [facility] 03/27/2013, 2:16 PM

## 2013-03-27 NOTE — Evaluation (Addendum)
Occupational Therapy Evaluation Patient Details Name: Patricia [name] MRN: [id number] DOB: 1926/09/18 Today's Date: 03/27/2013 Time: 1008-1040 OT Time Calculation (min): 32 min  OT Assessment / Plan / Recommendation History of present illness Per H & P:  The patient is a 79 y.o. year-old female with history of CAD, HTN, HLD, AS, MV, COPD, hypothyroidism, anemia who presents with nausea, vomiting, diarrhea.  The patient was last at their baseline health approximately 9 days ago.  The patient states that she has had problems with intermittent watery diarrhea for the last several years.  She was diagnosed with giardia about 1-2 years ago and her diarrhea temporarily improved and she was tested for cure and was negative.  The source was never identified.  She uses city water.  She states her bowels never returned to normal after that time and intermittently she develops watery stool    Clinical Impression   Pt was admitted for the above.  She will benefit from skilled OT to increase safety/balance with mobility related to ADLs and energy conservation during ADL activities.  Goals in acute are supervision to mod I level.      OT Assessment  Patient needs continued OT Services    Follow Up Recommendations  Home health OT    Barriers to Discharge      Equipment Recommendations  None recommended by OT   Has high commode   Recommendations for Other Services    Frequency  Min 2X/week    Precautions / Restrictions Precautions Precautions: Fall Precaution Comments: monitor O2, needs supplemental O2 during amb Restrictions Weight Bearing Restrictions: No   Pertinent Vitals/Pain No pain.  Sats 86% ambulating to bathroom without 02; 95-96% standing with 2 liters 02 for grooming tasks.  Pt had 2/4 dyspnea.  She initiates pursed lip breathing    ADL  Grooming: Supervision/safety;Teeth care;Wash/dry hands Where Assessed - Grooming: Supported standing Upper Body Bathing: Set up Where Assessed -  Upper Body Bathing: Supported sitting Lower Body Bathing: Supervision/safety Where Assessed - Lower Body Bathing: Supported sit to stand Upper Body Dressing: Set up Where Assessed - Upper Body Dressing: Supported sitting Lower Body Dressing: Supervision/safety Where Assessed - Lower Body Dressing: Supported sit to Pharmacist, hospital: Minimal Dentist Method: Sit to Barista: Comfort height toilet Toileting - Architect and Hygiene: Min guard Where Assessed - Engineer, mining and Hygiene: Sit to stand from 3-in-1 or toilet Equipment Used: Rolling walker Transfers/Ambulation Related to ADLs: ambulated to bathroom without 02--pt states she uses it as needed.  Sats 86%.  Reapplied 02 and sats 95-96%.  Pt slightly unsteady but no LOB while ambulating. ADL Comments: broke up grooming activities due to decreased sats.  Educated on energy conservation.  Pt hasn't been using shower seat but will do this when home.  02 used when pt performed denture and mouth care:  sats remained 95% but 2/4 dyspnea present    OT Diagnosis: Generalized weakness  OT Problem List: Decreased strength;Decreased activity tolerance;Cardiopulmonary status limiting activity;Impaired balance (sitting and/or standing) OT Treatment Interventions: Self-care/ADL training;Energy conservation;DME and/or AE instruction;Patient/family education;Balance training   OT Goals(Current goals can be found in the care plan section) Acute Rehab OT Goals Patient Stated Goal: home today OT Goal Formulation: With patient Time For Goal Achievement: 04/10/13 Potential to Achieve Goals: Good ADL Goals Pt Will Transfer to Toilet: with supervision;ambulating (high commode) Pt Will Perform Toileting - Clothing Manipulation and hygiene: with modified independence;sit to/from stand Pt Will Perform Tub/Shower  Transfer: with supervision;ambulating;Shower transfer;shower  seat Additional ADL Goal #1: pt will initiate sitting rest breaks for energy conservation without cues  Visit Information         Prior Functioning     Home Living Family/patient expects to be discharged to:: Private residence Living Arrangements: Spouse/significant other Home Equipment: Shower seat Prior Function Level of Independence: Independent Communication Communication: No difficulties         Vision/Perception     Cognition  Cognition Arousal/Alertness: Awake/alert Behavior During Therapy: WFL for tasks assessed/performed Overall Cognitive Status: Within Functional Limits for tasks assessed    Extremity/Trunk Assessment Upper Extremity Assessment Upper Extremity Assessment: Overall WFL for tasks assessed     Mobility Transfers Sit to Stand: 5: Supervision;With upper extremity assist;With armrests;From chair/3-in-1 Stand to Sit: 5: Supervision;With upper extremity assist;To chair/3-in-1     Exercise     Balance     End of Session OT - End of Session Activity Tolerance: Patient tolerated treatment well Patient left: in chair;with call bell/phone within reach;with family/visitor present  GO     Sherhonda Gaspar 03/27/2013, 11:14 AM Marica Otter, OTR/L [phone] 03/27/2013

## 2013-03-27 NOTE — Progress Notes (Signed)
Pt discharged to home. DC instructions given with daughter at bedside . prescription x 1 also given. No concerns voiced. Left unit in wheelchair pushed by nurse tech accompanied by daughter. Left in good condition. Vwilliams,rn.

## 2013-03-28 LAB — STOOL CULTURE: Special Requests: NORMAL

## 2013-04-09 ENCOUNTER — Ambulatory Visit (INDEPENDENT_AMBULATORY_CARE_PROVIDER_SITE_OTHER): Payer: Medicare Other | Admitting: Internal Medicine

## 2013-04-09 ENCOUNTER — Encounter: Payer: Self-pay | Admitting: Internal Medicine

## 2013-04-09 VITALS — BP 124/62 | HR 62 | Ht 64.0 in | Wt 171.0 lb

## 2013-04-09 DIAGNOSIS — R197 Diarrhea, unspecified: Secondary | ICD-10-CM

## 2013-04-09 DIAGNOSIS — I421 Obstructive hypertrophic cardiomyopathy: Secondary | ICD-10-CM

## 2013-04-09 DIAGNOSIS — R0609 Other forms of dyspnea: Secondary | ICD-10-CM

## 2013-04-09 DIAGNOSIS — I4891 Unspecified atrial fibrillation: Secondary | ICD-10-CM

## 2013-04-09 LAB — BASIC METABOLIC PANEL
CO2: 31 mEq/L (ref 19–32)
Calcium: 8.7 mg/dL (ref 8.4–10.5)
Creatinine, Ser: 1.1 mg/dL (ref 0.4–1.2)
GFR: 50.84 mL/min — ABNORMAL LOW (ref >60.00)
Glucose, Bld: 113 mg/dL — ABNORMAL HIGH (ref 70–99)
Sodium: 140 mEq/L (ref 135–145)

## 2013-04-09 LAB — CBC
HCT: 34.4 % — ABNORMAL LOW (ref 36.0–46.0)
MCHC: 32.5 g/dL (ref 30.0–36.0)
MCV: 78.1 fl (ref 78.0–100.0)
Platelets: 172 10*3/uL (ref 150.0–400.0)
RBC: 4.41 Mil/uL (ref 3.87–5.11)
RDW: 20 % — ABNORMAL HIGH (ref 11.5–14.6)

## 2013-04-09 LAB — IRON AND TIBC
Iron: 32 ug/dL — ABNORMAL LOW (ref 42–145)
UIBC: 345 ug/dL (ref 125–400)

## 2013-04-09 LAB — FERRITIN: Ferritin: 63.6 ng/mL (ref 10.0–291.0)

## 2013-04-09 NOTE — Progress Notes (Addendum)
HPI Patient is a 79 year old with a history of HOCM, very mild CAD hypertension, mitral stenosis (s/p replacement with bioprosthesis), atrial fibrillation  dyslipidemia. Limited stress echo showed no signif increase in LVOT gradient. She has had continued SOB CT of chest showed no fibrosis. R and L heart cath. showed no critical CAD, MR through mitral prosthesis of uncertain severity. PA pressures were increased with RA pressure of 53/10 and PCWP 24  I saw her in clinic in the summer  She was found to be back in atrial fibrllation.  Once her INR was greater than 2 for 4 wks she underwent DCCV.   She was hospitalized about 1 wk ago for N/V and diarrhea  Felt to be food poisoning.    I got a call from her granddaughter earlier this week  She said the patient was SOB labs showed and elevated BNP. On Monday she took 2 torsemide.  On Tues she took 1 torsamid.  . She ate food with K in it Since taking the diuretics she is breathing better  Edema is gone.   Denies palpitations.    Allergies  Allergen Reactions  . Ivp Dye [Iodinated Diagnostic Agents] Itching  . Nitrofurantoin Itching  . Ramipril     unknown    Current Outpatient Prescriptions  Medication Sig Dispense Refill  . amiodarone (PACERONE) 200 MG tablet Take 100 mg by mouth daily.      . Calcium 600-200 MG-UNIT per tablet Take 1 tablet by mouth at bedtime.       . cholecalciferol (VITAMIN D) 1000 UNITS tablet Take 1,000 Units by mouth at bedtime.       . Coenzyme Q10 (COQ10 PO) Take 300 mg by mouth daily.      . Flaxseed, Linseed, (TH FLAX SEED OIL PO) Take 1 capsule by mouth daily.      [name] levothyroxine (SYNTHROID, LEVOTHROID) 125 MCG tablet 1/2 tab po qd      . loperamide (IMODIUM) 2 MG capsule Take 2 mg by mouth as needed for diarrhea or loose stools.      [name] losartan (COZAAR) 100 MG tablet Take 1 tablet (100 mg total) by mouth daily.  30 tablet  6  . Probiotic Product (PROBIOTIC DAILY PO) Take 1 tablet by mouth once.       .  sertraline (ZOLOFT) 100 MG tablet Take 100 mg by mouth daily.      [name] warfarin (COUMADIN) 1 MG tablet Take 3 tablets (3 mg total) by mouth daily at 6 PM. Take 3 tablets ( 3mg ) daily until seen at coumadin clinic.  20 tablet  0   No current facility-administered medications for this visit.    Past Medical History  Diagnosis Date  . CAD (coronary artery disease)   . Mitral valve disorder   . HTN (hypertension)   . Dyslipidemia   . Aortic stenosis, mild   . Breast cyst   . Hypertrophic cardiomyopathy   . Sleep apnea   . COPD (chronic obstructive pulmonary disease)     "a touch of"; prn o2 at home with activity   . Atrial fibrillation   . Hypothyroidism   . Anemia     Past Surgical History  Procedure Laterality Date  . Laparoscopic cholecystectomy  2009  . Cataract extraction  2003    bilateral  . Mitral valve replacement  2007  . Neck mass excision  2003    benign  . Colonoscopy    . Broken wrist  left, metal implanted   . Broken ankle      right, metal implanted   . Cardiac catheterization  06/2011  . Breast surgery      bilateral cysts x3 removed, benign   . Tee without cardioversion  01/31/2012    Procedure: TRANSESOPHAGEAL ECHOCARDIOGRAM (TEE);  Surgeon: Pricilla Riffle, MD;  Location: [facility] ENDOSCOPY;  Service: Cardiovascular;  Laterality: N/A;  . Cardioversion N/A 01/16/2013    Procedure: CARDIOVERSION;  Surgeon: Vesta Mixer, MD;  Location[facility] ENDOSCOPY;  Service: Cardiovascular;  Laterality: N/A;    Family History  Problem Relation Age of Onset  . Diabetes Mother   . CVA Mother   . Other Father   . Diabetes Brother   . Cancer Brother     throat ca, smoker  . Aneurysm Brother     brain aneurysm  . Breast cancer Sister   . Emphysema Sister     smoker    History   Social History  . Marital Status: Married    Spouse Name: N/A    Number of Children: N/A  . Years of Education: N/A   Occupational History  . Not on file.   Social History Main Topics   . Smoking status: Former Smoker -- 1.00 packs/day for 40 years    Types: Cigarettes    Quit date: 05/21/1990  . Smokeless tobacco: Never Used  . Alcohol Use: No  . Drug Use: No  . Sexual Activity: No   Other Topics Concern  . Not on file   Social History Narrative   Lives with her husband.  Drives and walks around without assist device.            Review of Systems:  All systems reviewed.  They are negative to the above problem except as previously stated.  Vital Signs: BP 124/62  Pulse 62  Ht 5\' 4"  (1.626 m)  Wt 171 lb (77.565 kg)  BMI 29.34 kg/m2  Physical Exam Patient is in NAD HEENT:  Normocephalic, atraumatic. EOMI, PERRLA.  Neck: JVP is normal.  No bruits.  Lungs: clear to auscultation. No rales no wheezes.  Heart: Regular rate and rhythm. Normal S1, S2. No S3.   Gr III/Vi systolic murmur LSB to base  Abdomen:  Supple, nontender. Normal bowel sounds. No masses. No hepatomegaly.  Extremities:   Good distal pulses throughout. No lower extremity edema.  Musculoskeletal :moving all extremities.  Neuro:   alert and oriented x3.  CN II-XII grossly intact.  EKG SR 62 bpm  First degree AV block  PR 218 msec.  LVH  ST T wave changes consistent with repol abnormalitiy. Assessment and Plan: 1  Atrial fibrllation Remains in SR  Check INR  Other labs for amio are OK  Keep on current dose of amio for now  2.  CAD  Mild by cath in 07 3.  MV replacement. 4.  GI  Resolved  WIll check electrolytes   Most likely that rehydration lead to volume increase  Patient is fairly fragile.    4.  Pulm  Breathing is OK

## 2013-04-09 NOTE — Patient Instructions (Addendum)
Your physician recommends that you schedule a follow-up appointment for the end of March. Scheduled for March 23,2015 at 11:30

## 2013-04-10 LAB — RETICULOCYTES
ABS Retic: 75.5 10*3/uL (ref 19.0–186.0)
RBC.: 4.44 MIL/uL (ref 3.87–5.11)
Retic Ct Pct: 1.7 % (ref 0.4–2.3)

## 2013-04-21 ENCOUNTER — Telehealth: Payer: Self-pay | Admitting: Internal Medicine

## 2013-04-21 ENCOUNTER — Other Ambulatory Visit: Payer: Self-pay | Admitting: *Deleted

## 2013-04-21 DIAGNOSIS — E876 Hypokalemia: Secondary | ICD-10-CM

## 2013-04-21 DIAGNOSIS — D619 Aplastic anemia, unspecified: Secondary | ICD-10-CM

## 2013-04-21 MED ORDER — POTASSIUM CHLORIDE CRYS ER 20 MEQ PO TBCR: 20.0000 meq | EXTENDED_RELEASE_TABLET | Freq: Every day | ORAL | Status: DC

## 2013-04-21 NOTE — Telephone Encounter (Signed)
Notified of lab results.  Has been out of town.  Will send Rx in for K+ to [facility].  She will pick up the Fe Gluconate to take one tab 2 x a week.

## 2013-04-21 NOTE — Telephone Encounter (Signed)
New Problem:  Pt is requesting someone call her back with her recent test results.

## 2013-07-06 ENCOUNTER — Other Ambulatory Visit: Payer: Self-pay

## 2013-07-06 DIAGNOSIS — I10 Essential (primary) hypertension: Secondary | ICD-10-CM

## 2013-07-06 MED ORDER — LOSARTAN POTASSIUM 100 MG PO TABS: 100.0000 mg | ORAL_TABLET | Freq: Every day | ORAL | Status: DC

## 2013-07-12 ENCOUNTER — Inpatient Hospital Stay (HOSPITAL_COMMUNITY)
Admission: EM | Admit: 2013-07-12 | Discharge: 2013-07-14 | DRG: 292 | Disposition: A | Discharge status: Home / Self Care | Payer: Medicare Other | Attending: Cardiology | Admitting: Cardiology

## 2013-07-12 ENCOUNTER — Encounter (HOSPITAL_COMMUNITY): Payer: Self-pay | Admitting: Emergency Medicine

## 2013-07-12 ENCOUNTER — Emergency Department (HOSPITAL_COMMUNITY): Payer: Medicare Other

## 2013-07-12 DIAGNOSIS — R079 Chest pain, unspecified: Secondary | ICD-10-CM

## 2013-07-12 DIAGNOSIS — Z87891 Personal history of nicotine dependence: Secondary | ICD-10-CM

## 2013-07-12 DIAGNOSIS — Z79899 Other long term (current) drug therapy: Secondary | ICD-10-CM

## 2013-07-12 DIAGNOSIS — Z952 Presence of prosthetic heart valve: Secondary | ICD-10-CM

## 2013-07-12 DIAGNOSIS — I509 Heart failure, unspecified: Secondary | ICD-10-CM | POA: Diagnosis present

## 2013-07-12 DIAGNOSIS — N179 Acute kidney failure, unspecified: Secondary | ICD-10-CM

## 2013-07-12 DIAGNOSIS — G4730 Sleep apnea, unspecified: Secondary | ICD-10-CM | POA: Diagnosis present

## 2013-07-12 DIAGNOSIS — I421 Obstructive hypertrophic cardiomyopathy: Secondary | ICD-10-CM

## 2013-07-12 DIAGNOSIS — Z953 Presence of xenogenic heart valve: Secondary | ICD-10-CM

## 2013-07-12 DIAGNOSIS — I5043 Acute on chronic combined systolic (congestive) and diastolic (congestive) heart failure: Principal | ICD-10-CM | POA: Diagnosis present

## 2013-07-12 DIAGNOSIS — J4489 Other specified chronic obstructive pulmonary disease: Secondary | ICD-10-CM | POA: Diagnosis present

## 2013-07-12 DIAGNOSIS — J449 Chronic obstructive pulmonary disease, unspecified: Secondary | ICD-10-CM

## 2013-07-12 DIAGNOSIS — I200 Unstable angina: Secondary | ICD-10-CM | POA: Insufficient documentation

## 2013-07-12 DIAGNOSIS — I2789 Other specified pulmonary heart diseases: Secondary | ICD-10-CM | POA: Diagnosis present

## 2013-07-12 DIAGNOSIS — E785 Hyperlipidemia, unspecified: Secondary | ICD-10-CM | POA: Diagnosis present

## 2013-07-12 DIAGNOSIS — Z7901 Long term (current) use of anticoagulants: Secondary | ICD-10-CM

## 2013-07-12 DIAGNOSIS — I10 Essential (primary) hypertension: Secondary | ICD-10-CM | POA: Diagnosis present

## 2013-07-12 DIAGNOSIS — IMO0002 Reserved for concepts with insufficient information to code with codable children: Secondary | ICD-10-CM | POA: Diagnosis present

## 2013-07-12 DIAGNOSIS — I2510 Atherosclerotic heart disease of native coronary artery without angina pectoris: Secondary | ICD-10-CM

## 2013-07-12 DIAGNOSIS — I379 Nonrheumatic pulmonary valve disorder, unspecified: Secondary | ICD-10-CM

## 2013-07-12 DIAGNOSIS — R160 Hepatomegaly, not elsewhere classified: Secondary | ICD-10-CM

## 2013-07-12 DIAGNOSIS — I700 Atherosclerosis of aorta: Secondary | ICD-10-CM | POA: Diagnosis present

## 2013-07-12 DIAGNOSIS — I050 Rheumatic mitral stenosis: Secondary | ICD-10-CM | POA: Diagnosis present

## 2013-07-12 DIAGNOSIS — Z7982 Long term (current) use of aspirin: Secondary | ICD-10-CM

## 2013-07-12 DIAGNOSIS — I4891 Unspecified atrial fibrillation: Secondary | ICD-10-CM | POA: Diagnosis present

## 2013-07-12 DIAGNOSIS — I359 Nonrheumatic aortic valve disorder, unspecified: Secondary | ICD-10-CM

## 2013-07-12 DIAGNOSIS — R0789 Other chest pain: Secondary | ICD-10-CM

## 2013-07-12 DIAGNOSIS — I119 Hypertensive heart disease without heart failure: Secondary | ICD-10-CM | POA: Diagnosis present

## 2013-07-12 DIAGNOSIS — E039 Hypothyroidism, unspecified: Secondary | ICD-10-CM | POA: Diagnosis present

## 2013-07-12 HISTORY — DX: Chronic right heart failure: I50.812

## 2013-07-12 LAB — BASIC METABOLIC PANEL
BUN: 18 mg/dL (ref 6–23)
CALCIUM: 8.7 mg/dL (ref 8.4–10.5)
CO2: 24 meq/L (ref 19–32)
CREATININE: 0.85 mg/dL (ref 0.50–1.10)
Chloride: 104 mEq/L (ref 96–112)
GFR, EST AFRICAN AMERICAN: 71 mL/min — AB (ref >90)
GFR, EST NON AFRICAN AMERICAN: 61 mL/min — AB (ref >90)
Glucose, Bld: 111 mg/dL — ABNORMAL HIGH (ref 70–99)
Potassium: 3.6 mEq/L — ABNORMAL LOW (ref 3.7–5.3)
SODIUM: 139 meq/L (ref 137–147)

## 2013-07-12 LAB — MRSA PCR SCREENING: MRSA by PCR: NEGATIVE

## 2013-07-12 LAB — PROTIME-INR
INR: 2.29 — ABNORMAL HIGH (ref 0.00–1.49)
Prothrombin Time: 24.5 seconds — ABNORMAL HIGH (ref 11.6–15.2)

## 2013-07-12 LAB — T4, FREE: Free T4: 1.27 ng/dL (ref 0.80–1.80)

## 2013-07-12 LAB — CBC WITH DIFFERENTIAL/PLATELET
BASOS ABS: 0 10*3/uL (ref 0.0–0.1)
BASOS PCT: 1 % (ref 0–1)
EOS ABS: 0.3 10*3/uL (ref 0.0–0.7)
EOS PCT: 4 % (ref 0–5)
HEMATOCRIT: 32.9 % — AB (ref 36.0–46.0)
Hemoglobin: 10.2 g/dL — ABNORMAL LOW (ref 12.0–15.0)
Lymphocytes Relative: 9 % — ABNORMAL LOW (ref 12–46)
Lymphs Abs: 0.7 10*3/uL (ref 0.7–4.0)
MCH: 24.5 pg — AB (ref 26.0–34.0)
MCHC: 31 g/dL (ref 30.0–36.0)
MCV: 79.1 fL (ref 78.0–100.0)
MONO ABS: 0.4 10*3/uL (ref 0.1–1.0)
Monocytes Relative: 6 % (ref 3–12)
Neutro Abs: 5.7 10*3/uL (ref 1.7–7.7)
Neutrophils Relative %: 81 % — ABNORMAL HIGH (ref 43–77)
PLATELETS: 122 10*3/uL — AB (ref 150–400)
RBC: 4.16 MIL/uL (ref 3.87–5.11)
RDW: 17.4 % — ABNORMAL HIGH (ref 11.5–15.5)
WBC: 7.1 10*3/uL (ref 4.0–10.5)

## 2013-07-12 LAB — TROPONIN I
Troponin I: <0.3 ng/mL (ref <0.30)
Troponin I: <0.3 ng/mL (ref <0.30)
Troponin I: 0.3 ng/mL (ref <0.30)

## 2013-07-12 LAB — I-STAT TROPONIN, ED: TROPONIN I, POC: 0.04 ng/mL (ref 0.00–0.08)

## 2013-07-12 LAB — PRO B NATRIURETIC PEPTIDE: Pro B Natriuretic peptide (BNP): 3163 pg/mL — ABNORMAL HIGH (ref 0–450)

## 2013-07-12 LAB — TSH
TSH: 2.731 u[IU]/mL (ref 0.350–4.500)
TSH: 2.267 u[IU]/mL (ref 0.350–4.500)

## 2013-07-12 MED ORDER — TH FLAX SEED OIL 1000 MG PO CAPS: 1.0000 | ORAL_CAPSULE | Freq: Every day | ORAL | Status: DC

## 2013-07-12 MED ORDER — NITROGLYCERIN IN D5W 200-5 MCG/ML-% IV SOLN
2.0000 ug/min | INTRAVENOUS | Status: DC
Start: 2013-07-12 — End: 2013-07-14
  Administered 2013-07-12: 5 ug/min via INTRAVENOUS
  Filled 2013-07-12: qty 250

## 2013-07-12 MED ORDER — AMIODARONE HCL 100 MG PO TABS
100.0000 mg | ORAL_TABLET | Freq: Every day | ORAL | Status: DC
  Administered 2013-07-12 – 2013-07-14 (×3): 100 mg via ORAL
  Filled 2013-07-12 (×4): qty 1

## 2013-07-12 MED ORDER — WARFARIN SODIUM 3 MG PO TABS
3.5000 mg | ORAL_TABLET | Freq: Every day | ORAL | Status: DC
  Administered 2013-07-12 – 2013-07-13 (×2): 3.5 mg via ORAL
  Filled 2013-07-12 (×4): qty 1

## 2013-07-12 MED ORDER — ONDANSETRON HCL 4 MG/2ML IJ SOLN
4.0000 mg | Freq: Four times a day (QID) | INTRAMUSCULAR | Status: DC | PRN
  Administered 2013-07-12: 4 mg via INTRAVENOUS
  Filled 2013-07-12: qty 2

## 2013-07-12 MED ORDER — NITROGLYCERIN 0.4 MG SL SUBL: 0.4000 mg | SUBLINGUAL_TABLET | SUBLINGUAL | Status: DC | PRN

## 2013-07-12 MED ORDER — LEVOTHYROXINE SODIUM 125 MCG PO TABS
62.5000 ug | ORAL_TABLET | Freq: Every day | ORAL | Status: DC
  Administered 2013-07-12 – 2013-07-14 (×3): 62.5 ug via ORAL
  Filled 2013-07-12 (×6): qty 0.5

## 2013-07-12 MED ORDER — SODIUM CHLORIDE 0.9 % IJ SOLN: 3.0000 mL | INTRAMUSCULAR | Status: DC | PRN

## 2013-07-12 MED ORDER — ZOLPIDEM TARTRATE 5 MG PO TABS: 5.0000 mg | ORAL_TABLET | Freq: Every evening | ORAL | Status: DC | PRN

## 2013-07-12 MED ORDER — MORPHINE SULFATE 2 MG/ML IJ SOLN
2.0000 mg | Freq: Once | INTRAMUSCULAR | Status: AC
  Administered 2013-07-12: 2 mg via INTRAVENOUS
  Filled 2013-07-12: qty 1

## 2013-07-12 MED ORDER — FUROSEMIDE 10 MG/ML IJ SOLN
40.0000 mg | Freq: Once | INTRAMUSCULAR | Status: AC
  Administered 2013-07-12: 40 mg via INTRAVENOUS
  Filled 2013-07-12: qty 4

## 2013-07-12 MED ORDER — FUROSEMIDE 10 MG/ML IJ SOLN: 40.0000 mg | Freq: Two times a day (BID) | INTRAMUSCULAR | Status: DC

## 2013-07-12 MED ORDER — SODIUM CHLORIDE 0.9 % IV SOLN: 250.0000 mL | INTRAVENOUS | Status: DC | PRN

## 2013-07-12 MED ORDER — SERTRALINE HCL 100 MG PO TABS
100.0000 mg | ORAL_TABLET | Freq: Every day | ORAL | Status: DC
  Administered 2013-07-12 – 2013-07-14 (×3): 100 mg via ORAL
  Filled 2013-07-12 (×3): qty 1

## 2013-07-12 MED ORDER — FLORA-Q PO CAPS
1.0000 | ORAL_CAPSULE | Freq: Every day | ORAL | Status: DC
  Administered 2013-07-12 – 2013-07-14 (×3): 1 via ORAL
  Filled 2013-07-12 (×4): qty 1

## 2013-07-12 MED ORDER — FUROSEMIDE 10 MG/ML IJ SOLN
40.0000 mg | Freq: Every day | INTRAMUSCULAR | Status: DC
  Administered 2013-07-13: 40 mg via INTRAVENOUS
  Filled 2013-07-12: qty 4

## 2013-07-12 MED ORDER — LOSARTAN POTASSIUM 50 MG PO TABS
50.0000 mg | ORAL_TABLET | Freq: Every day | ORAL | Status: DC
  Administered 2013-07-12 – 2013-07-14 (×3): 50 mg via ORAL
  Filled 2013-07-12 (×4): qty 1

## 2013-07-12 MED ORDER — GI COCKTAIL ~~LOC~~
30.0000 mL | Freq: Once | ORAL | Status: AC
  Administered 2013-07-12: 30 mL via ORAL
  Filled 2013-07-12: qty 30

## 2013-07-12 MED ORDER — WARFARIN - PHARMACIST DOSING INPATIENT
Freq: Every day | Status: DC
  Administered 2013-07-13: 17:00:00

## 2013-07-12 MED ORDER — ACETAMINOPHEN 325 MG PO TABS
650.0000 mg | ORAL_TABLET | ORAL | Status: DC | PRN
  Administered 2013-07-12 – 2013-07-14 (×4): 650 mg via ORAL
  Filled 2013-07-12 (×3): qty 2

## 2013-07-12 MED ORDER — VITAMIN D3 25 MCG (1000 UNIT) PO TABS
1000.0000 [IU] | ORAL_TABLET | Freq: Every day | ORAL | Status: DC
  Administered 2013-07-12 – 2013-07-13 (×2): 1000 [IU] via ORAL
  Filled 2013-07-12 (×3): qty 1

## 2013-07-12 MED ORDER — SODIUM CHLORIDE 0.9 % IJ SOLN
3.0000 mL | Freq: Two times a day (BID) | INTRAMUSCULAR | Status: DC
  Administered 2013-07-12 – 2013-07-13 (×4): 3 mL via INTRAVENOUS

## 2013-07-12 MED ORDER — COQ10 200 MG PO CAPS: 200.0000 mg | ORAL_CAPSULE | Freq: Every day | ORAL | Status: DC

## 2013-07-12 MED ORDER — NITROGLYCERIN 2 % TD OINT
1.0000 [in_us] | TOPICAL_OINTMENT | Freq: Once | TRANSDERMAL | Status: AC
  Administered 2013-07-12: 1 [in_us] via TOPICAL
  Filled 2013-07-12: qty 1

## 2013-07-12 NOTE — Progress Notes (Signed)
Nitro increased, see MAR.

## 2013-07-12 NOTE — Progress Notes (Signed)
ANTICOAGULATION CONSULT NOTE - Initial Consult  Pharmacy Consult for coumadin Indication: atrial fibrillation  Allergies  Allergen Reactions  . Ivp Dye [Iodinated Diagnostic Agents] Itching  . Nitrofurantoin Itching  . Ramipril     unknown    Patient Measurements: Height: 5\' 4"  (162.6 cm) Weight: 165 lb (74.844 kg) IBW/kg (Calculated) : 54.7   Vital Signs: Temp: 97.8 F (36.6 C) (02/22 78290638) Temp src: Oral (02/22 0638) BP: 140/60 mmHg (02/22 1114) Pulse Rate: 53 (02/22 1114)  Labs:  Recent Labs  07/12/13 0715  HGB 10.2*  HCT 32.9*  PLT 122*  LABPROT 24.5*  INR 2.29*  CREATININE 0.85    Estimated Creatinine Clearance: 48.8 ml/min (by C-G formula based on Cr of 0.85).   Medical History: Past Medical History  Diagnosis Date  . CAD (coronary artery disease)   . Mitral valve disorder   . HTN (hypertension)   . Dyslipidemia   . Aortic stenosis, mild   . Breast cyst   . Hypertrophic cardiomyopathy   . Sleep apnea   . COPD (chronic obstructive pulmonary disease)     "a touch of"; prn o2 at home with activity   . Atrial fibrillation   . Hypothyroidism   . Anemia     Medications:  See med rec  Assessment: 80 yo lady to continue coumadin for h/o afib.  Admit INR therapeutic on home dose of 3.5 mg daily Goal of Therapy:  INR 2-3 Monitor platelets by anticoagulation protocol: Yes   Plan:  Cont home dose coumadin. Daily PT/INR for now  Taequan Stockhausen Poteet 07/12/2013,11:35 AM

## 2013-07-12 NOTE — Progress Notes (Signed)
Nitro decreased, see MAR

## 2013-07-12 NOTE — ED Provider Notes (Signed)
CSN: [id number]     Arrival date & time 07/12/13  4098 History   None    Chief Complaint  Patient presents with  . Chest Pain     (Consider location/radiation/quality/duration/timing/severity/associated sxs/prior Treatment) HPI Comments: Patient with history of mitral valve repair on Coumadin, paroxysmal atrial fibrillation, most recent catheterization in 06/2011, oxygen-dependent COPD -- presents with complaint of chest pain onset between 5:30a and 6a. Patient awoke this morning and ambulated. She developed acute onset of chest pain with radiation to her back. Pain is described as tight and painful. She denies associated nausea, diaphoresis, palpitations. EMS was called. Patient was given aspirin and nitroglycerin x 2. The nitroglycerin improved but did not resolve the pain. Pain is now beginning to worsen. Patient has not had similar pain in the recent days to weeks. The onset of this condition was acute. The course is constant. Aggravating factors: none. Alleviating factors: none.   06/2011 Cath Final Conclusions:   1.  Prosthetic mitral valve with mitral regurgitation of uncertain severity 2.  No critical CAD  3.  Pulmonary hypertension, secondary to MR and possibly diastolic dysfunction   Patient is a 80 y.o. female presenting with chest pain. The history is provided by the patient and medical records.  Chest Pain Associated symptoms: back pain   Associated symptoms: no abdominal pain, no cough, no diaphoresis, no fever, no nausea, no palpitations, no shortness of breath and not vomiting     Past Medical History  Diagnosis Date  . CAD (coronary artery disease)   . Mitral valve disorder   . HTN (hypertension)   . Dyslipidemia   . Aortic stenosis, mild   . Breast cyst   . Hypertrophic cardiomyopathy   . Sleep apnea   . COPD (chronic obstructive pulmonary disease)     "a touch of"; prn o2 at home with activity   . Atrial fibrillation   . Hypothyroidism   . Anemia    Past  Surgical History  Procedure Laterality Date  . Laparoscopic cholecystectomy  2009  . Cataract extraction  2003    bilateral  . Mitral valve replacement  2007  . Neck mass excision  2003    benign  . Colonoscopy    . Broken wrist      left, metal implanted   . Broken ankle      right, metal implanted   . Cardiac catheterization  06/2011  . Breast surgery      bilateral cysts x3 removed, benign   . Tee without cardioversion  01/31/2012    Procedure: TRANSESOPHAGEAL ECHOCARDIOGRAM (TEE);  Surgeon: Pricilla Riffle, MD;  Location: [facility] ENDOSCOPY;  Service: Cardiovascular;  Laterality: N/A;  . Cardioversion N/A 01/16/2013    Procedure: CARDIOVERSION;  Surgeon: Vesta Mixer, MD;  Location: [facility] ENDOSCOPY;  Service: Cardiovascular;  Laterality: N/A;   Family History  Problem Relation Age of Onset  . Diabetes Mother   . CVA Mother   . Other Father   . Diabetes Brother   . Cancer Brother     throat ca, smoker  . Aneurysm Brother     brain aneurysm  . Breast cancer Sister   . Emphysema Sister     smoker   History  Substance Use Topics  . Smoking status: Former Smoker -- 1.00 packs/day for 40 years    Types: Cigarettes    Quit date: 05/21/1990  . Smokeless tobacco: Never Used  . Alcohol Use: No   OB History   Grav Para  Term Preterm Abortions TAB SAB Ect Mult Living                 Review of Systems  Constitutional: Negative for fever and diaphoresis.  Eyes: Negative for redness.  Respiratory: Negative for cough and shortness of breath.   Cardiovascular: Positive for chest pain. Negative for palpitations and leg swelling.  Gastrointestinal: Negative for nausea, vomiting and abdominal pain.  Genitourinary: Negative for dysuria.  Musculoskeletal: Positive for back pain. Negative for neck pain.  Skin: Negative for rash.  Neurological: Negative for syncope and light-headedness.   Allergies  Ivp dye; Nitrofurantoin; and Ramipril  Home Medications   Current Outpatient Rx  Name   Route  Sig  Dispense  Refill  . amiodarone (PACERONE) 200 MG tablet   Oral   Take 100 mg by mouth daily.         . Calcium 600-200 MG-UNIT per tablet   Oral   Take 1 tablet by mouth at bedtime.          . cholecalciferol (VITAMIN D) 1000 UNITS tablet   Oral   Take 1,000 Units by mouth at bedtime.          . Coenzyme Q10 (COQ10 PO)   Oral   Take 300 mg by mouth daily.         . Flaxseed, Linseed, (TH FLAX SEED OIL PO)   Oral   Take 1 capsule by mouth daily.         [name] levothyroxine (SYNTHROID, LEVOTHROID) 125 MCG tablet      1/2 tab po qd         . loperamide (IMODIUM) 2 MG capsule   Oral   Take 2 mg by mouth as needed for diarrhea or loose stools.         [name] losartan (COZAAR) 100 MG tablet   Oral   Take 1 tablet (100 mg total) by mouth daily.   30 tablet   6   . potassium chloride SA (K-DUR,KLOR-CON) 20 MEQ tablet   Oral   Take 1 tablet (20 mEq total) by mouth daily.   30 tablet   3   . Probiotic Product (PROBIOTIC DAILY PO)   Oral   Take 1 tablet by mouth once.          . sertraline (ZOLOFT) 100 MG tablet   Oral   Take 100 mg by mouth daily.         [name] warfarin (COUMADIN) 1 MG tablet   Oral   Take 3 tablets (3 mg total) by mouth daily at 6 PM. Take 3 tablets ( 3mg ) daily until seen at coumadin clinic.   20 tablet   0    BP 149/60  Pulse 54  Temp(Src) 97.8 F (36.6 C) (Oral)  Resp 19  Ht 5\' 4"  (1.626 m)  Wt 165 lb (74.844 kg)  BMI 28.31 kg/m2  SpO2 94% Physical Exam  Nursing note and vitals reviewed. Constitutional: She appears well-developed and well-nourished.  BP similar bilateral arms (performed by Dr. Romeo Apple)  HENT:  Head: Normocephalic and atraumatic.  Mouth/Throat: Oropharynx is clear and moist and mucous membranes are normal. Mucous membranes are not dry.  Eyes: Conjunctivae are normal.  Neck: Trachea normal and normal range of motion. Neck supple. Normal carotid pulses and no JVD present. No muscular tenderness  present. Carotid bruit is not present. No tracheal deviation present.  Cardiovascular: Normal rate, regular rhythm, S1 normal, S2 normal, normal heart sounds and intact  distal pulses.  Exam reveals no decreased pulses.   No murmur heard. Pulses:      Radial pulses are 2+ on the right side, and 2+ on the left side.  Pulmonary/Chest: Effort normal. No respiratory distress. She has no wheezes. She exhibits no tenderness.  Abdominal: Soft. Normal aorta and bowel sounds are normal. There is no tenderness. There is no rebound and no guarding.  Musculoskeletal: Normal range of motion.  Neurological: She is alert.  Skin: Skin is warm and dry. She is not diaphoretic. No cyanosis. No pallor.  Psychiatric: She has a normal mood and affect.    ED Course  Procedures (including critical care time) Labs Review Labs Reviewed  CBC WITH DIFFERENTIAL - Abnormal; Notable for the following:    Hemoglobin 10.2 (*)    HCT 32.9 (*)    MCH 24.5 (*)    RDW 17.4 (*)    Platelets 122 (*)    Neutrophils Relative % 81 (*)    Lymphocytes Relative 9 (*)    All other components within normal limits  BASIC METABOLIC PANEL - Abnormal; Notable for the following:    Potassium 3.6 (*)    Glucose, Bld 111 (*)    GFR calc non Af Amer 61 (*)    GFR calc Af Amer 71 (*)    All other components within normal limits  PROTIME-INR - Abnormal; Notable for the following:    Prothrombin Time 24.5 (*)    INR 2.29 (*)    All other components within normal limits  I-STAT TROPOININ, ED   Imaging Review Dg Chest 2 View  07/12/2013   CLINICAL DATA:  Chest pain, shortness of breath, history of mitral valve replacement and coronary bypass  EXAM: CHEST  2 VIEW  COMPARISON:  06/07/2011, 07/16/2009  FINDINGS: Postop changes from mitral valve replacement. Cardiomegaly evident with diffuse increased interstitial opacities throughout both lungs compatible with edema. No significant effusion or pneumothorax. Associated perihilar and  bibasilar scattered atelectasis. Atherosclerosis of the aorta.  IMPRESSION: Cardiomegaly with new diffuse interstitial pattern compatible with edema/early CHF  Basilar atelectasis   Electronically Signed   By: Ruel Favorsrevor  Shick M.D.   On: 07/12/2013 07:54    EKG Interpretation    Date/Time:  Sunday July 12 2013 06:30:38 EST Ventricular Rate:  54 PR Interval:  239 QRS Duration: 131 QT Interval:  543 QTC Calculation: 515 R Axis:   -53 Text Interpretation:  Age not entered, assumed to be  80 years old for purpose of ECG interpretation Sinus or ectopic atrial rhythm Prolonged PR interval RBBB and LAFB Left ventricular hypertrophy Anterior Q waves, possibly due to LVH Abnormal T, consider ischemia, lateral leads Confirmed by HARRISON  MD, FORREST ([id number]) on 07/12/2013 7:10:49 AM           6:47 AM Patient seen and examined. Work-up initiated. Medications ordered.   Vital signs reviewed and are as follows: Filed Vitals:   07/12/13 0638  BP: 149/60  Pulse: 54  Temp: 97.8 F (36.6 C)  Resp: 19   7:13 AM Pt d/w and seen by Dr. Romeo AppleHarrison. Nitro paste/morphine ordered.   8:01 AM Cardiology paged. Patient continues to have pain. She has nitro paste applied. She has not yet received morphine (was in xray).   8:18 AM Spoke with Dr. Mayford Knifeurner who will see.   11:12 AM Barrett PA-C has seen patient. Pt to be admitted.    MDM   Final diagnoses:  Chest pain   Admit for CP.  Renne Crigler, PA-C 07/12/13 1112

## 2013-07-12 NOTE — H&P (Signed)
Patient interviewed, examined and chart and echoes reviewed.  Started having CP this am shortly after eating 2 bites of breakfast.  Described as a band across her chest that is a pressure associated with SOB.  She denies any LE edema.  Initial cardiac enzymes are normal.  EKG shows NSR with LAFB, LVH and marked diffuse ST abnormality that has not changed since last EKG.  Chest xray showed early CHF.  Her pain improved slightly with NTG and did not improve with GI cocktail.  I suspect her symptoms are from acute diastolic CHF.  She had a cardiac cath in 2013 with minimal nonobstructive ASCAD.  I would recommend rule out with serial cardiac enzymes and if rules out consider a Lexiscan myoview to rule out ischemia.  Will start IV diuretics but with history of HOCM will need to diurese slowly.  Check TSH.  Repeat echo to assess valves and rule out pericardial effusion.  She is already anticoagulated with a therapeutic INR.  Continue Amio for afib.  Continue Warfarin and ASA for MVR. Cannot add beta blocker for diastolic CHF due to bradycardia.  Will continue ARB.

## 2013-07-12 NOTE — ED Notes (Signed)
Cardiology PA at bedside. 

## 2013-07-12 NOTE — Progress Notes (Signed)
  Echocardiogram 2D Echocardiogram has been performed.  Patricia [name] [name] 07/12/2013, 3:53 PM

## 2013-07-12 NOTE — H&P (Signed)
CARDIOLOGY HISTORY AND PHYSICAL   Patient ID: Patricia [name] MRN: [id number] DOB/AGE: 1926/09/15 80 y.o.  Admit date: 07/12/2013  Primary Physician   Londell Moh, MD Primary Cardiologist   PR Reason for Consultation   Chest pain  NWG:NFAOZ Patricia [name] is a 80 y.o. female with a history of non-obstructive CAD, HOCM and CHF. She has also had PAF, s/p DCCV 12/2012. She has been in her usual state of health recently, not aware of any weight gain or edema. Her dyspnea on exertion has been at baseline.  The patient was already awake at 5:30 AM today when she ate 2 bites of food and then had sudden onset of chest pain that goes all the way around her chest and squeezes. The pain became severe up to a 10/10. It seemed to originate between her shoulder blades and is worse when she lays down. She denies any change with deep inspiration in her chest wall was not tender. When her symptoms did not resolve, she called EMS. EMS report is not available but her pain resolved briefly with sublingual nitroglycerin. The pain recurred however, and she was given nitroglycerin paste in the ER which did not help the pain very much. She then got morphine which helped decrease the pain. It is currently an 8/10. She does not routinely have this pain it does not remember having it before. She feels a little short of breath with it but there is no nausea or diaphoresis.   Past Medical History  Diagnosis Date  . CAD (coronary artery disease)   . Mitral valve disorder   . HTN (hypertension)   . Dyslipidemia   . Aortic stenosis, mild   . Breast cyst   . Hypertrophic cardiomyopathy   . Sleep apnea   . COPD (chronic obstructive pulmonary disease)     "a touch of"; prn o2 at home with activity   . Atrial fibrillation   . Hypothyroidism   . Anemia      Past Surgical History  Procedure Laterality Date  . Laparoscopic cholecystectomy  2009  . Cataract extraction  2003    bilateral  . Mitral valve  replacement  2007  . Neck mass excision  2003    benign  . Colonoscopy    . Broken wrist      left, metal implanted   . Broken ankle      right, metal implanted   . Cardiac catheterization  06/2011  . Breast surgery      bilateral cysts x3 removed, benign   . Tee without cardioversion  01/31/2012    Procedure: TRANSESOPHAGEAL ECHOCARDIOGRAM (TEE);  Surgeon: Pricilla Riffle, MD;  Location: [facility] ENDOSCOPY;  Service: Cardiovascular;  Laterality: N/A;  . Cardioversion N/A 01/16/2013    Procedure: CARDIOVERSION;  Surgeon: Vesta Mixer, MD;  Location: [facility] ENDOSCOPY;  Service: Cardiovascular;  Laterality: N/A;    Allergies  Allergen Reactions  . Ivp Dye [Iodinated Diagnostic Agents] Itching  . Nitrofurantoin Itching  . Ramipril     unknown    I have reviewed the patient's current medications Prior to Admission medications   Medication Sig Start Date End Date Taking? Authorizing Provider  amiodarone (PACERONE) 200 MG tablet Take 100 mg by mouth daily. 11/28/12  Yes Pricilla Riffle, MD  cholecalciferol (VITAMIN D) 1000 UNITS tablet Take 1,000 Units by mouth at bedtime.    Yes Historical Provider, MD  Coenzyme Q10 (COQ10 PO) Take 300 mg by mouth daily.  Yes Historical Provider, MD  Flaxseed, Linseed, (TH FLAX SEED OIL PO) Take 1 capsule by mouth daily.   Yes Historical Provider, MD  levothyroxine (SYNTHROID, LEVOTHROID) 125 MCG tablet Take 62.5 mcg by mouth daily before breakfast.   Yes Historical Provider, MD  losartan (COZAAR) 100 MG tablet Take 1 tablet (100 mg total) by mouth daily. 07/06/13  Yes Pricilla Riffle, MD  Probiotic Product (PROBIOTIC DAILY PO) Take 1 tablet by mouth daily.   Yes Historical Provider, MD  sertraline (ZOLOFT) 100 MG tablet Take 100 mg by mouth daily.   Yes Historical Provider, MD  warfarin (COUMADIN) 1 MG tablet Take 1 mg by mouth daily. Take with 0.5 of 5mg  tablet to = 3.5mg /dose   Yes Historical Provider, MD  warfarin (COUMADIN) 5 MG tablet Take 2.5 mg by mouth daily.  Take with 1mg  tablet to = 3.5mg /dose   Yes Historical Provider, MD  aspirin 81 MG chewable tablet Chew 324 mg by mouth once.    Historical Provider, MD  nitroGLYCERIN (NITROSTAT) 0.4 MG SL tablet Place 0.4 mg under the tongue every 5 (five) minutes as needed for chest pain.    Historical Provider, MD     History   Social History  . Marital Status: Married    Spouse Name: N/A    Number of Children: N/A  . Years of Education: N/A   Occupational History  . Retired    Social History Main Topics  . Smoking status: Former Smoker -- 1.00 packs/day for 40 years    Types: Cigarettes    Quit date: 05/21/1990  . Smokeless tobacco: Never Used  . Alcohol Use: No  . Drug Use: No  . Sexual Activity: No   Other Topics Concern  . Not on file   Social History Narrative   Lives with her husband.  Drives and walks around without assist device.            Family Status  Relation Status Death Age  . Mother  22  . Father      accidental death  . Brother  10  . Sister  50  . Sister  29   Family History  Problem Relation Age of Onset  . Diabetes Mother   . CVA Mother   . Other Father   . Diabetes Brother   . Cancer Brother     throat ca, smoker  . Aneurysm Brother     brain aneurysm  . Breast cancer Sister   . Emphysema Sister     smoker     ROS:  Full 14 point review of systems complete and found to be negative unless listed above.  Physical Exam: Blood pressure 123/61, pulse 59, temperature 97.8 F (36.6 C), temperature source Oral, resp. rate 18, height 5\' 4"  (1.626 m), weight 165 lb (74.844 kg), SpO2 92.00%.  General: Well developed, well nourished, elderly female in no acute distress Head: Eyes PERRLA, No xanthomas.   Normocephalic and atraumatic, oropharynx without edema or exudate. Dentition: Poor Lungs: Bibasilar rales Heart: HRRR S1 S2, no rub/gallop, + murmur. pulses are 2+ all 4 extrem.   Neck: No carotid bruits. No lymphadenopathy.  JVD at 10 cm. Abdomen: Bowel  sounds present, abdomen soft and non-tender without masses or hernias noted. Msk:  No spine or cva tenderness. No weakness, no joint deformities or effusions. Extremities: No clubbing or cyanosis. no edema.  Neuro: Alert and oriented X 3. No focal deficits noted. Psych:  Good affect, responds appropriately Skin:  No rashes or lesions noted.  Labs:   Lab Results  Component Value Date   WBC 7.1 07/12/2013   HGB 10.2* 07/12/2013   HCT 32.9* 07/12/2013   MCV 79.1 07/12/2013   PLT 122* 07/12/2013    Recent Labs  07/12/13 0715  INR 2.29*     Recent Labs Lab 07/12/13 0715  NA 139  K 3.6*  CL 104  CO2 24  BUN 18  CREATININE 0.85  CALCIUM 8.7  GLUCOSE 111*    Recent Labs  07/12/13 0722  TROPIPOC 0.04   Pro B Natriuretic peptide (BNP)  Date/Time Value Ref Range Status  04/09/2013 12:38 PM 766.0* 0.0 - 100.0 pg/mL Final  11/28/2012  1:25 PM 749.0* 0.0 - 100.0 pg/mL Final   TSH  Date/Time Value Ref Range Status  03/24/2013  4:00 PM 1.340  0.350 - 4.500 uIU/mL Final     Performed at [facility]     T3, Free  Date/Time Value Ref Range Status  05/30/2010 10:05 AM 1.8* 2.3 - 4.2 pg/mL Final     Cardiac Cath: 06/26/2011 Procedural Findings:  Hemodynamics  RA 10  RV 53/10  PA 53/20 ((35)  PCWP 24  LV 184/23  AO 172/73 (110)  Oxygen saturations:  PA 73  AO 99  SVC 74  Cardiac Output (Fick) 5.4 L/m  Cardiac Index (Fick) 2.9 L/m/m2  Coronary angiography:  Coronary dominance: right  Left mainstem: Large caliber vessel that tapers distally  Left anterior descending (LAD): Diffuse calcification with mild plaquing, but no critical obstruction. Large diagonal has 20% proximal narrowing.  Left circumflex (LCx): There may be mild abnormality at the takeoff of the ramus and av circ. The ramus has mild segmental plaque. The av cir provides a large marginal without obstruction.  Right coronary artery (RCA): Large caliber vessel free of disease.  Left ventriculography:  Left ventricular systolic function is normal is vigorous with EF greater than 65%. There appears to be significant MR, that is difficult to gauge. There is a prosthetic valve with exentsive MAC, and perivavlular leak cannot be excluded.  Final Conclusions:  1. Prosthetic mitral valve with mitral regurgitation of uncertain severity  2. No critical CAD  3. Pulmonary hypertension, secondary to MR and possibly diastolic dysfunction  Recommendations:  1. Consider TEE for better evaluation of the valve.    Echo: 01/31/2012 Conclusions Left ventricle: LV is severely thickened, especially interventricular septum. LV cavity is small. IT is difficult to image LVOT because of reverberation artifact LVEF is normal. Aortic valve: AV is thickened, calcified. There is mildly restricted motion. Mild insufficiency. Aorta: Minimal plaquing of thoracic aorta. Mitral valve: s/p MV repair. MV is mildly thickened. There is mildly restricted motion from the repair. Peak and mean gradients through the valve are 31 and 10 mm Hg respectively. MVA by Pt1/2 is 1.7 cm2. There is mild MR predominantly from 2 jets, one being perivalvular. Doppler: Mean gradient: 9mm Hg (D). Peak gradient: 28mm Hg (D). Right ventricle: RV is normal in size with normal function. Pulmonic valve: PV is normal. Tricuspid valve: TV is normal. Trace TR. Ascending Aorta: - Minimal plaquing of thoracic aorta.  ECG:  12-Jul-2013 06:30:38  Sinus or ectopic atrial rhythm Prolonged PR interval RBBB and LAFB Left ventricular hypertrophy Anterior Q waves, possibly due to LVH Abnormal T, consider ischemia, lateral leads Vent. rate 54 BPM PR interval 239 ms QRS duration 131 ms QT/QTc 543/515 ms P-R-T axes -75 -53 107  Radiology:  Dg Chest 2 View  07/12/2013   CLINICAL DATA:  Chest pain, shortness of breath, history of mitral valve replacement and coronary bypass  EXAM: CHEST  2 VIEW  COMPARISON:  06/07/2011, 07/16/2009  FINDINGS: Postop  changes from mitral valve replacement. Cardiomegaly evident with diffuse increased interstitial opacities throughout both lungs compatible with edema. No significant effusion or pneumothorax. Associated perihilar and bibasilar scattered atelectasis. Atherosclerosis of the aorta.  IMPRESSION: Cardiomegaly with new diffuse interstitial pattern compatible with edema/early CHF  Basilar atelectasis   Electronically Signed   By: Ruel Favors M.D.   On: 07/12/2013 07:54    ASSESSMENT AND PLAN:   The patient was seen today by Dr. Mayford Knife, the patient evaluated and the data reviewed.   Principal Problem:   Chest pain - unclear cause right now, admit stepdown, cycle enzymes, diurese, try GI cocktail if IV nitro does not help. Ck echo, cath if ez elevate, otherwise, per MD. Hold coumadin for now.  Otherwise, continue current Rx. Active Problems:   DYSLIPIDEMIA   HYPERTENSION, UNSPECIFIED   CAD   Mitral valve disorders   AORTIC STENOSIS, MILD   Hypertrophic obstructive cardiomyopathy(425.11)   Signed: Theodore Demark, PA-C 07/12/2013 11:00 AM Beeper [phone]  Co-Sign MD

## 2013-07-12 NOTE — ED Notes (Signed)
Pt got tangled in IV getting on BSC, IV came out.

## 2013-07-12 NOTE — Progress Notes (Signed)
CRITICAL VALUE ALERT  Critical value received: Troponin 0.30  Date of notification: 07/12/13  Time of notification:  2215  Critical value read back:yes  Nurse who received alert:  Exie ParodySandra Niang Mitcheltree RN  MD notified (1st page):  [facility]   Time of first page:  2025  Responding MD: Dr. Donnie Ahoilley   Time MD responded:  2040

## 2013-07-12 NOTE — Progress Notes (Signed)
Nitro increased, see MAR. 

## 2013-07-12 NOTE — ED Notes (Signed)
Patient arrives from home with sudden onset chest pain that started while ambulating to and from restroom. Pain initially circled chest. Currently describes pain as being in right chest wrapping around the right and crossing across back to left side. EMS administered Nitro SL x2, and ASA 324mg . Nitro tabs reduced pain to a 3/10 from initial 10/10.

## 2013-07-12 NOTE — Progress Notes (Signed)
PHARMACIST - PHYSICIAN ORDER COMMUNICATION  CONCERNING: P&T Medication Policy on Herbal Medications  DESCRIPTION:  This patient's order for:  CoQ10 and Flax seed oil  has been noted.  This product(s) is classified as an "herbal" or natural product. Due to a lack of definitive safety studies or FDA approval, nonstandard manufacturing practices, plus the potential risk of unknown drug-drug interactions while on inpatient medications, the Pharmacy and Therapeutics Committee does not permit the use of "herbal" or natural products of this type within [facility].   ACTION TAKEN: The pharmacy department is unable to verify this order at this time and your patient has been informed of this safety policy. Please reevaluate patient's clinical condition at discharge and address if the herbal or natural product(s) should be resumed at that time.

## 2013-07-12 NOTE — Progress Notes (Signed)
Patient ate a few spoons of rice with gravy, a few bites of hamburger, and drank most of her tea before she vomited. Zofran was given, and she denied any further nausea.  Zenovia JordanBridget Burrell Hodapp, RN

## 2013-07-13 DIAGNOSIS — I2510 Atherosclerotic heart disease of native coronary artery without angina pectoris: Secondary | ICD-10-CM

## 2013-07-13 DIAGNOSIS — I359 Nonrheumatic aortic valve disorder, unspecified: Secondary | ICD-10-CM

## 2013-07-13 DIAGNOSIS — I421 Obstructive hypertrophic cardiomyopathy: Secondary | ICD-10-CM

## 2013-07-13 DIAGNOSIS — J449 Chronic obstructive pulmonary disease, unspecified: Secondary | ICD-10-CM

## 2013-07-13 DIAGNOSIS — N179 Acute kidney failure, unspecified: Secondary | ICD-10-CM

## 2013-07-13 DIAGNOSIS — R079 Chest pain, unspecified: Secondary | ICD-10-CM

## 2013-07-13 LAB — COMPREHENSIVE METABOLIC PANEL
ALK PHOS: 74 U/L (ref 39–117)
ALT: 18 U/L (ref 0–35)
AST: 27 U/L (ref 0–37)
Albumin: 3.1 g/dL — ABNORMAL LOW (ref 3.5–5.2)
BILIRUBIN TOTAL: 0.7 mg/dL (ref 0.3–1.2)
BUN: 18 mg/dL (ref 6–23)
CHLORIDE: 99 meq/L (ref 96–112)
CO2: 24 mEq/L (ref 19–32)
Calcium: 8.4 mg/dL (ref 8.4–10.5)
Creatinine, Ser: 0.89 mg/dL (ref 0.50–1.10)
GFR calc Af Amer: 67 mL/min — ABNORMAL LOW (ref >90)
GFR, EST NON AFRICAN AMERICAN: 58 mL/min — AB (ref >90)
GLUCOSE: 65 mg/dL — AB (ref 70–99)
Potassium: 4.2 mEq/L (ref 3.7–5.3)
Sodium: 136 mEq/L — ABNORMAL LOW (ref 137–147)
TOTAL PROTEIN: 6.3 g/dL (ref 6.0–8.3)

## 2013-07-13 LAB — AMYLASE: Amylase: 30 U/L (ref 0–105)

## 2013-07-13 LAB — TROPONIN I
Troponin I: <0.3 ng/mL (ref <0.30)
Troponin I: <0.3 ng/mL (ref <0.30)

## 2013-07-13 LAB — LIPID PANEL
Cholesterol: 156 mg/dL (ref 0–200)
HDL: 31 mg/dL — ABNORMAL LOW (ref >39)
LDL Cholesterol: 109 mg/dL — ABNORMAL HIGH (ref 0–99)
Total CHOL/HDL Ratio: 5 RATIO
Triglycerides: 79 mg/dL (ref <150)
VLDL: 16 mg/dL (ref 0–40)

## 2013-07-13 LAB — PROTIME-INR
INR: 2.83 — ABNORMAL HIGH (ref 0.00–1.49)
PROTHROMBIN TIME: 28.8 s — AB (ref 11.6–15.2)

## 2013-07-13 LAB — LIPASE, BLOOD: Lipase: 12 U/L (ref 11–59)

## 2013-07-13 MED ORDER — FUROSEMIDE 40 MG PO TABS
40.0000 mg | ORAL_TABLET | Freq: Every day | ORAL | Status: DC
  Administered 2013-07-14: 40 mg via ORAL
  Filled 2013-07-13: qty 1

## 2013-07-13 MED ORDER — ENSURE COMPLETE PO LIQD
237.0000 mL | Freq: Two times a day (BID) | ORAL | Status: DC
  Administered 2013-07-13: 237 mL via ORAL

## 2013-07-13 NOTE — Care Management Note (Signed)
    Page 1 of 1   07/13/2013     10:20:07 AM   CARE MANAGEMENT NOTE 07/13/2013  Patient:  Patricia [name],Patricia [name]   Account Number:  [id number]  Date Initiated:  07/13/2013  Documentation initiated by:  Junius CreamerWELL,DEBBIE  Subjective/Objective Assessment:   adm w ch pain     Action/Plan:   lives w husband, pcp dr Radio producerwalter pharr   Anticipated DC Date:     Anticipated DC Plan:           Choice offered to / List presented to:             Status of service:   Medicare Important Message given?   (If response is "NO", the following Medicare IM given date fields will be blank) Date Medicare IM given:   Date Additional Medicare IM given:    Discharge Disposition:    Per UR Regulation:  Reviewed for med. necessity/level of care/duration of stay  If discussed at Long Length of Stay Meetings, dates discussed:    Comments:

## 2013-07-13 NOTE — Progress Notes (Signed)
INITIAL NUTRITION ASSESSMENT  Pt meets criteria for NON-SEVERE (MODERATE) malnutrition in the context of chronic illness as evidenced by mild-moderate muscle depletion and 10% weight loss within 6 months.  DOCUMENTATION CODES Per approved criteria  -Non-severe (moderate) malnutrition in the context of chronic illness   INTERVENTION: Provide Ensure Complete BID between meals, each container provides 350 kcals and 13 grams of protein.  NUTRITION DIAGNOSIS: Increased nutrient needs related to COPD as evidenced by estimated nutrition needs.   Goal: Pt to meet >/= 90% of their estimated nutrition needs.  Monitor:  PO intake, weight trends, labs  Reason for Assessment: Consult- pt identified having poor PO intake/appetite by MD.  80 y.o. female  Admitting Dx: Chest pain  ASSESSMENT: 80 y.o. female with a history of non-obstructive CAD, mitral valve disorder, HTN, dyslipidemia, mild AS, A. Fib, anemia, oxygen-dependent COPD, HOCM, and CHF. She has also had PAF, s/p DCCV 12/2012. Pt admitted with chest pain and SOB.   Pt reports having a 30 lb weight loss over the past 2 years. Pt reports having an "ok" appetite, but explains that her appetite used to be very good last year. Pt reports her appetite being on and off sometimes. At home pt reports she has been eating good with mainly eating 2 meals a day and snacking in between. Pt reports eating a big breakfast and dinner. Pt usually cooks breakfast and her daughter would cook her dinner. Pt reports not eating a lot of meat, but was encouraged and educated on eating more protein containing foods to help prevent muscle loss. Pt reports getting around ok at home, but does not move a lot as her is on oxygen at home. Pt reports drinking Ensure at home sometimes and is willing to drink Ensure when hospitalized. RD will order Ensure for patient. Pt was educated on the importance of making sure she is getting calories and protein when her appetite is  down and that drinking Ensure would be good for those times.   Meal completion 25-100%.  Nutrition Focused Physical Exam:  Subcutaneous Fat:  Orbital Region: N/A Upper Arm Region: WNL-noted loss in muscle tone, and loose skin Thoracic and Lumbar Region: WNL  Muscle:  Temple Region: WNL Clavicle Bone Region: Mild depletion Clavicle and Acromion Bone Region: Mild depletion Scapular Bone Region: N/A Dorsal Hand: Severe depletion- but WNL due to age Patellar Region: NWL Anterior Thigh Region: WNL Posterior Calf Region: Moderate depletion  Edema: none   Height: Ht Readings from Last 1 Encounters:  07/12/13 5\' 4"  (1.626 m)    Weight: Wt Readings from Last 1 Encounters:  07/13/13 164 lb 14.5 oz (74.8 kg)    Ideal Body Weight: 120 lb  % Ideal Body Weight: 137%  Wt Readings from Last 10 Encounters:  07/13/13 164 lb 14.5 oz (74.8 kg)  04/09/13 171 lb (77.565 kg)  03/27/13 173 lb 14.4 oz (78.881 kg)  01/07/13 183 lb 6.4 oz (83.19 kg)  11/28/12 176 lb (79.833 kg)  03/25/12 183 lb 3.2 oz (83.099 kg)  01/31/12 184 lb (83.462 kg)  01/31/12 184 lb (83.462 kg)  01/17/12 188 lb (85.276 kg)  01/10/12 189 lb 12.8 oz (86.093 kg)    Usual Body Weight: 170 lb  % Usual Body Weight: 96%  BMI:  Body mass index is 28.29 kg/(m^2).  Estimated Nutritional Needs: Kcal: 1500-1700 Protein: 75-85 grams of protein Fluid: 1.5 L/day-1.7 L/day  Skin: no issues noted  Diet Order: Cardiac   EDUCATION NEEDS: -Education needs addressed  Intake/Output Summary (Last 24 hours) at 07/13/13 1431 Last data filed at 07/13/13 1400  Gross per 24 hour  Intake 829.69 ml  Output   2050 ml  Net -1220.31 ml    Last BM: 2/21   Labs:   Recent Labs Lab 07/12/13 0715 07/13/13 0130  NA 139 136*  K 3.6* 4.2  CL 104 99  CO2 24 24  BUN 18 18  CREATININE 0.85 0.89  CALCIUM 8.7 8.4  GLUCOSE 111* 65*    CBG (last 3)  No results found for this basename: GLUCAP,  in the last 72  hours  Scheduled Meds: . amiodarone  100 mg Oral Daily  . cholecalciferol  1,000 Units Oral QHS  . FLORA-Q  1 capsule Oral Daily  . furosemide  40 mg Intravenous Daily  . levothyroxine  62.5 mcg Oral QAC breakfast  . losartan  50 mg Oral Daily  . sertraline  100 mg Oral Daily  . sodium chloride  3 mL Intravenous Q12H  . warfarin  3.5 mg Oral q1800  . Warfarin - Pharmacist Dosing Inpatient   Does not apply q1800    Continuous Infusions: . nitroGLYCERIN 10 mcg/min (07/13/13 1230)    Past Medical History  Diagnosis Date  . CAD (coronary artery disease)   . Mitral valve disorder   . HTN (hypertension)   . Dyslipidemia   . Aortic stenosis, mild   . Breast cyst   . Hypertrophic cardiomyopathy   . Sleep apnea   . COPD (chronic obstructive pulmonary disease)     "a touch of"; prn o2 at home with activity   . Atrial fibrillation   . Hypothyroidism   . Anemia     Past Surgical History  Procedure Laterality Date  . Laparoscopic cholecystectomy  2009  . Cataract extraction  2003    bilateral  . Mitral valve replacement  2007  . Neck mass excision  2003    benign  . Colonoscopy    . Broken wrist      left, metal implanted   . Broken ankle      right, metal implanted   . Cardiac catheterization  06/2011  . Breast surgery      bilateral cysts x3 removed, benign   . Tee without cardioversion  01/31/2012    Procedure: TRANSESOPHAGEAL ECHOCARDIOGRAM (TEE);  Surgeon: Pricilla RifflePaula V Ross, MD;  [facility];  Service: Cardiovascular;  Laterality: N/A;  . Cardioversion N/A 01/16/2013    Procedure: CARDIOVERSION;  Surgeon: Vesta MixerPhilip J Nahser, MD;  [facility];  Service: Cardiovascular;  Laterality: N/A;    Marijean NiemannStephanie Gloriana Piltz Dietetic Intern Pager: [phone]

## 2013-07-13 NOTE — Progress Notes (Signed)
Agree with documentation and interventions as specified by dietetic intern.   Pt has lost 10.3% of her usual weight since August 2014 which is clinically significant for the time frame.  Loyce DysKacie Donovin Kraemer, MS RD LDN Clinical Inpatient Dietitian Pager: [phone] Weekend/After hours pager: [phone]

## 2013-07-13 NOTE — Progress Notes (Addendum)
Patient Name: Patricia [name] Date of Encounter: 07/13/2013  Length of Stay: 1  SUBJECTIVE  The patient still has lower chest/abdominal pain that resolve with tylenol. SOB has improved.   CURRENT MEDS . amiodarone  100 mg Oral Daily  . cholecalciferol  1,000 Units Oral QHS  . FLORA-Q  1 capsule Oral Daily  . furosemide  40 mg Intravenous Daily  . levothyroxine  62.5 mcg Oral QAC breakfast  . losartan  50 mg Oral Daily  . sertraline  100 mg Oral Daily  . sodium chloride  3 mL Intravenous Q12H  . warfarin  3.5 mg Oral q1800  . Warfarin - Pharmacist Dosing Inpatient   Does not apply q1800   OBJECTIVE  Filed Vitals:   07/13/13 0800 07/13/13 0900 07/13/13 1000 07/13/13 1220  BP: 140/52 133/62 148/65 136/54  Pulse: 58 56 57 56  Temp:    98.5 F (36.9 C)  TempSrc:    Oral  Resp: 20 17 16 22   Height:      Weight:      SpO2: 91% 92% 94% 95%    Intake/Output Summary (Last 24 hours) at 07/13/13 1341 Last data filed at 07/13/13 1300  Gross per 24 hour  Intake 586.69 ml  Output   2200 ml  Net -1613.31 ml   Filed Weights   07/12/13 0638 07/12/13 1300 07/13/13 0400  Weight: 165 lb (74.844 kg) 166 lb 3.6 oz (75.4 kg) 164 lb 14.5 oz (74.8 kg)    PHYSICAL EXAM  General: Pleasant, NAD. Neuro: Alert and oriented X 3. Moves all extremities spontaneously. Psych: Normal affect. HEENT:  Normal  Neck: Supple without bruits or JVD. Lungs:  Resp regular and unlabored, few crackles at the bases. Heart: RRR no s3, s4, or murmurs. Abdomen: Soft, non-tender, non-distended, BS + x 4.  Extremities: No clubbing, cyanosis or edema. DP/PT/Radials 2+ and equal bilaterally.  Accessory Clinical Findings  CBC  Recent Labs  07/12/13 0715  WBC 7.1  NEUTROABS 5.7  HGB 10.2*  HCT 32.9*  MCV 79.1  PLT 122*   Basic Metabolic Panel  Recent Labs  07/12/13 0715 07/13/13 0130  NA 139 136*  K 3.6* 4.2  CL 104 99  CO2 24 24  GLUCOSE 111* 65*  BUN 18 18  CREATININE 0.85 0.89    CALCIUM 8.7 8.4   Liver Function Tests  Recent Labs  07/13/13 0130  AST 27  ALT 18  ALKPHOS 74  BILITOT 0.7  PROT 6.3  ALBUMIN 3.1*   Cardiac Enzymes  Recent Labs  07/12/13 1857 07/13/13 0028 07/13/13 0705  TROPONINI 0.30* <0.30 <0.30    Recent Labs  07/13/13 0130  CHOL 156  HDL 31*  LDLCALC 109*  TRIG 79  CHOLHDL 5.0   Thyroid Function Tests  Recent Labs  07/12/13 1350  TSH 2.267   Radiology/Studies  Dg Chest 2 View  07/12/2013   CLINICAL DATA:  Chest pain, shortness of breath, history of mitral valve replacement and coronary bypass   IMPRESSION: Cardiomegaly with new diffuse interstitial pattern compatible with edema/early CHF  Basilar atelectasis   Electronically Signed   By: Ruel Favors M.D.   On: 07/12/2013 07:54   TELE: SR, PVCs  ECG:    ASSESSMENT   Principal Problem:   Chest pain Active Problems:   DYSLIPIDEMIA   HYPERTENSION, UNSPECIFIED   CAD   Mitral valve disorders   AORTIC STENOSIS, MILD   Hypertrophic obstructive cardiomyopathy(425.11)   Intermediate coronary syndrome  PLAN  1. Acute on chronic diastolic CHF - continue daily iv Furosemide, - 1.7 L negative fluid balance, SOB has improved, switch to PO lasix tomorrow   2. Chest pain - might be unrelated to her CHF, as it resolves with Tylenol and has back pain as well, very low likehood of ACS, non-obstructive cath in 2013. She also had some nausea, we will check amylase, lipase. No stress test indicated at this point.  3. HCM- not on BB as she is bradycardic at baseline  4. Parox a-fib - on warfarin and amiodarone  Signed, Tobias AlexanderNELSON, Adon Gehlhausen, H MD, [facility] 07/13/2013

## 2013-07-13 NOTE — Progress Notes (Signed)
ANTICOAGULATION CONSULT NOTE - Follow Up Consult  Pharmacy Consult for coumadin Indication: afib w/  Prosthetic mitral valve    Allergies  Allergen Reactions  . Ivp Dye [Iodinated Diagnostic Agents] Itching  . Nitrofurantoin Itching  . Ramipril     unknown    Patient Measurements: Height: 5\' 4"  (162.6 cm) Weight: 164 lb 14.5 oz (74.8 kg) IBW/kg (Calculated) : 54.7  Vital Signs: Temp: 98.7 F (37.1 C) (02/23 0739) Temp src: Oral (02/23 0739) BP: 147/54 mmHg (02/23 0700) Pulse Rate: 55 (02/23 0700)  Labs:  Recent Labs  07/12/13 0715  07/12/13 1857 07/13/13 0028 07/13/13 0130 07/13/13 0705  HGB 10.2*  --   --   --   --   --   HCT 32.9*  --   --   --   --   --   PLT 122*  --   --   --   --   --   LABPROT 24.5*  --   --   --  28.8*  --   INR 2.29*  --   --   --  2.83*  --   CREATININE 0.85  --   --   --  0.89  --   TROPONINI  --   < > 0.30* <0.30  --  <0.30  < > = values in this interval not displayed.  Estimated Creatinine Clearance: 46.6 ml/min (by C-G formula based on Cr of 0.89).   Medications:  Scheduled:  . amiodarone  100 mg Oral Daily  . cholecalciferol  1,000 Units Oral QHS  . FLORA-Q  1 capsule Oral Daily  . furosemide  40 mg Intravenous Daily  . levothyroxine  62.5 mcg Oral QAC breakfast  . losartan  50 mg Oral Daily  . sertraline  100 mg Oral Daily  . sodium chloride  3 mL Intravenous Q12H  . warfarin  3.5 mg Oral q1800  . Warfarin - Pharmacist Dosing Inpatient   Does not apply q1800    Assessment: 80 yo female here with CP and on coumadin for history of afib and prosthetic mitral valve. Patient is at goal on coumadin (INR= 2.83) on home dose (3.5mg /day).   Goal of Therapy:  INR 2-3 Monitor platelets by anticoagulation protocol: Yes   Plan:  -Continue coumadin at home dose -Daily PT/INR for now  Harland Germanndrew Blen Ransome, Pharm D 07/13/2013 8:25 AM

## 2013-07-13 NOTE — ED Provider Notes (Signed)
Medical screening examination/treatment/procedure(s) were conducted as a shared visit with non-physician practitioner(s) and myself.  I personally evaluated the patient during the encounter.    Date: 07/13/2013  Rate: 54  Rhythm: sinus bradycardia  QRS Axis: left  Intervals: PR prolonged  ST/T Wave abnormalities: nonspecific ST/T changes  Conduction Disutrbances:right bundle branch block and left anterior fascicular block  Narrative Interpretation: Non-spec ST/T changes  Old EKG Reviewed: changes noted  I interviewed and examined the patient. Lungs are CTAB. Cardiac exam wnl. Abdomen soft. ASA, nitro paste, morphine for CP. Pt having intermittent relief w/ tx here. Consulted cards who will admit.    Junius ArgyleForrest S Ermelinda Eckert, MD 07/13/13 [id number]

## 2013-07-14 ENCOUNTER — Encounter (HOSPITAL_COMMUNITY): Payer: Self-pay | Admitting: Nurse Practitioner

## 2013-07-14 ENCOUNTER — Inpatient Hospital Stay (HOSPITAL_COMMUNITY): Payer: Medicare Other

## 2013-07-14 DIAGNOSIS — Z7901 Long term (current) use of anticoagulants: Secondary | ICD-10-CM

## 2013-07-14 DIAGNOSIS — I5032 Chronic diastolic (congestive) heart failure: Secondary | ICD-10-CM | POA: Insufficient documentation

## 2013-07-14 DIAGNOSIS — R0789 Other chest pain: Secondary | ICD-10-CM

## 2013-07-14 DIAGNOSIS — R160 Hepatomegaly, not elsewhere classified: Secondary | ICD-10-CM

## 2013-07-14 LAB — PROTIME-INR
INR: 3.19 — AB (ref 0.00–1.49)
Prothrombin Time: 31.5 seconds — ABNORMAL HIGH (ref 11.6–15.2)

## 2013-07-14 MED ORDER — FUROSEMIDE 20 MG PO TABS: ORAL_TABLET | ORAL | Status: DC

## 2013-07-14 NOTE — Progress Notes (Signed)
Pt discharged to home, IV's pulled,cath intact, site ok. Instructions given to pt and family departed floor with all belongings via wheelchair.

## 2013-07-14 NOTE — Progress Notes (Signed)
Patient Name: Patricia [name] Date of Encounter: 07/14/2013  Length of Stay: 2  SUBJECTIVE  The patient feels well. She developed B/L lateral chest wall pain this morning, that she describes as muscle pain.   CURRENT MEDS . amiodarone  100 mg Oral Daily  . cholecalciferol  1,000 Units Oral QHS  . feeding supplement (ENSURE COMPLETE)  237 mL Oral BID BM  . FLORA-Q  1 capsule Oral Daily  . furosemide  40 mg Oral Daily  . levothyroxine  62.5 mcg Oral QAC breakfast  . losartan  50 mg Oral Daily  . sertraline  100 mg Oral Daily  . sodium chloride  3 mL Intravenous Q12H  . warfarin  3.5 mg Oral q1800  . Warfarin - Pharmacist Dosing Inpatient   Does not apply q1800   OBJECTIVE  Filed Vitals:   07/14/13 0408 07/14/13 0420 07/14/13 0726 07/14/13 0800  BP: 145/47  131/61   Pulse: 55  53 55  Temp:  98.6 F (37 C) 99 F (37.2 C)   TempSrc:  Oral Oral   Resp: 18  20 19   Height:      Weight:  164 lb 3.9 oz (74.5 kg)    SpO2: 96%  96% 94%    Intake/Output Summary (Last 24 hours) at 07/14/13 0925 Last data filed at 07/14/13 0600  Gross per 24 hour  Intake 823.15 ml  Output   2150 ml  Net -1326.85 ml   Filed Weights   07/12/13 1300 07/13/13 0400 07/14/13 0420  Weight: 166 lb 3.6 oz (75.4 kg) 164 lb 14.5 oz (74.8 kg) 164 lb 3.9 oz (74.5 kg)    PHYSICAL EXAM  General: Pleasant, NAD. Neuro: Alert and oriented X 3. Moves all extremities spontaneously. Psych: Normal affect. HEENT:  Normal  Neck: Supple without bruits or JVD. Lungs:  Resp regular and unlabored, few crackles at the bases. Heart: RRR no s3, s4, 5/5 holosystolic murmur. Abdomen: Soft, non-tender, non-distended, BS + x 4. Liver palpable 2 cm bellow ribs Extremities: No clubbing, cyanosis or edema. DP/PT/Radials 2+ and equal bilaterally.  Accessory Clinical Findings  CBC  Recent Labs  07/12/13 0715  WBC 7.1  NEUTROABS 5.7  HGB 10.2*  HCT 32.9*  MCV 79.1  PLT 122*   Basic Metabolic Panel  Recent  Labs  16/10/96 0715 07/13/13 0130  NA 139 136*  K 3.6* 4.2  CL 104 99  CO2 24 24  GLUCOSE 111* 65*  BUN 18 18  CREATININE 0.85 0.89  CALCIUM 8.7 8.4   Liver Function Tests  Recent Labs  07/13/13 0130  AST 27  ALT 18  ALKPHOS 74  BILITOT 0.7  PROT 6.3  ALBUMIN 3.1*   Cardiac Enzymes  Recent Labs  07/12/13 1857 07/13/13 0028 07/13/13 0705  TROPONINI 0.30* <0.30 <0.30    Recent Labs  07/13/13 0130  CHOL 156  HDL 31*  LDLCALC 109*  TRIG 79  CHOLHDL 5.0   Thyroid Function Tests  Recent Labs  07/12/13 1350  TSH 2.267   Radiology/Studies  Dg Chest 2 View  07/12/2013   CLINICAL DATA:  Chest pain, shortness of breath, history of mitral valve replacement and coronary bypass   IMPRESSION: Cardiomegaly with new diffuse interstitial pattern compatible with edema/early CHF  Basilar atelectasis   Electronically Signed   By: Ruel Favors M.D.   On: 07/12/2013 07:54   TELE: SR, PVCs  ECG:    ASSESSMENT   Principal Problem:   Chest pain Active Problems:  DYSLIPIDEMIA   HYPERTENSION, UNSPECIFIED   CAD   Mitral valve disorders   AORTIC STENOSIS, MILD   Hypertrophic obstructive cardiomyopathy(425.11)   Intermediate coronary syndrome   PLAN  1. Right hear failure secondary to severe mitral stenosis-  I personally reviewed her echocardiogram, her peak transmitral gradient is 39 mmHg and mean is 13 mmHg. This is consistent with significant (severe) stenosis of the bioprosthesis and is also slightly worse than in 2013 (mean transmitral 10 mmHg then). There is now severe RV dysfunction with moderate TR and severe pulmonary hypertension RVSP 60 mmHg).  She also has severe LVH with LVEF >75%.  This is a difficult situation, the patient is not a candidate for re-do surgery. I discussed it with her and she is not interested. Use of diuretics to compensate for right sided heart failure might worsen intracavitary gradient with HCM.  -She is now euvolemic, we will  send her home with twice weekly Lasix 20 mg daily (Monday and Thursday).  - She is not a candidate for beta-blockers as she is bradycardic as baseline - Use of calcium channel blockers most probably won't be of any benefit as her pulmonary HTN is secondary to elevated left sided filling pressures (MS).  2. Chest pain - seems like a musculoskeletal problem, but can be also caused by hepatic congestion, we will check liver US. Amylase, lipase were negative.  Very low likehood of ACS, non-obstructive cath in 2013. No stress test indicated at this point.  3. HCM- not on BB as she is bradycardic at baseline  4. Parox a-fib - on warfarin and amiodarone  Signed, Tobias AlexanderNELSON, Sufyaan Palma, H MD, [facility] 07/14/2013

## 2013-07-14 NOTE — Discharge Instructions (Signed)
***  PLEASE REMEMBER TO BRING ALL OF YOUR MEDICATIONS TO EACH OF YOUR FOLLOW-UP OFFICE VISITS.  

## 2013-07-14 NOTE — Discharge Summary (Signed)
Discharge Summary   Patient ID: Patricia [name],  MRN: [id number], DOB/AGE: 06-11-1926 80 y.o.  Admit date: 07/12/2013 Discharge date: 07/14/2013  Primary Care Provider: Merri BrunettePHARR,WALTER DAVIDSON Primary Cardiologist: Lovina ReachP. Ross, MD   Discharge Diagnoses Principal Problem:   Midsternal chest pain  Active Problems:   Hypertrophic obstructive cardiomyopathy(425.11)   H/O bioprosthetic MVR with Sev Mitral stenosis by echo this admission.  **Peak transmitral gradient of 39 mmHg with mean gradient of 13 mmHg.   Pulmonary hypertension, secondary   Acute on chronic systolic right heart failure  **Net negative diuresis of 3L this admission.  Discharge weight of 164 lbs.   Pulmonary HTN   DYSLIPIDEMIA   HYPERTENSION, UNSPECIFIED   Hepatomegaly   Aortic sclerosis   Anticoagulated on Coumadin   PAF   Chronic coumadin anticoagulation  Allergies Allergies  Allergen Reactions  . Ivp Dye [Iodinated Diagnostic Agents] Itching  . Nitrofurantoin Itching  . Ramipril     unknown   Procedures  2D Echocardiogram 2.23.2015  Study Conclusions  - Left ventricle: The cavity size was normal. There was   moderate concentric hypertrophy. Systolic function was   normal. The estimated ejection fraction was in the range   of 55% to 60%. Wall motion was normal; there were no   regional wall motion abnormalities. - Aortic valve: Severe thickening and calcification,   consistent with sclerosis. Mild regurgitation. - Mitral valve: Mild regurgitation. - Left atrium: The atrium was moderately dilated. - Right ventricle: The cavity size was severely dilated.   Wall thickness was normal. Systolic function was severely   reduced. - Right atrium: The atrium was severely dilated. - Tricuspid valve: Moderate regurgitation. - Pulmonic valve: Moderate regurgitation. - Pulmonary arteries: PA peak pressure: 60mm Hg (S). _____________   Abdominal Ultrasound 2.24.2015  IMPRESSION: No significant abnormality  in the abdomen. Small bilateral pleural effusions. _____________   History of Present Illness  80 year old female with a prior history of hypertrophic obstructive cardiomyopathy, bioprosthetic mitral valve replacement,  diastolic and right-sided heart failure, and paroxysmal atrial fibrillation on chronic Coumadin.  She was in her usual state of health until the morning of admission when she had sudden onset of chest pressure and squeezing radiating to her upper back. Symptoms were worse with lying down. As symptoms persisted, EMS was called and she was taken to the [facility] where she was treated with nitroglycerin paste without much affect. She was then treated with morphine with some reduction in chest pain. ECG was notable for LVH and marked in diffuse ST abnormality that was not acutely different from prior ECGs. Chest x-ray showed early CHF. Troponin was normal. Patient was felt to have mild volume overload on exam and she was admitted for further evaluation.  Hospital Course  Patient ruled out for myocardial infarction. She did not have any further chest pain and in the setting of previously nonobstructive CAD on catheterization in 2013, without objective evidence of ischemia now, decision was made not to pursue any additional ischemic testing. With intravenous diuresis, she had symptomatic improvement in dyspnea and is -3 L this admission with a 2 pound reduction in weight to 164 pounds at discharge.  2-D echocardiography was carried out on February 23, and this has since been reviewed by Dr. Tobias AlexanderKatarina Nelson. Echocardiography showed severe right heart failure and severe mitral stenosis with a peak transmitral gradient of 39 mm mercury and a mean gradient of 13 mm mercury. This was up from her previous mean transmitral gradient of 10 mm mercury  in 2013. She also had significant pulmonary hypertension with a right ventricular systolic pressure of 60 mm mercury. In the setting of severe left  ventricular hypertrophy, EF was estimated to be greater than 75% upon review. Patient is not felt to be a candidate for redo mitral valve surgery given her advanced age and comorbidities. There was concern that use of chronic diuretics to compensate for right-sided heart failure might worsen intracavitary gradient in the setting of hypertrophic cardiomyopathy. She's felt to be euvolemic today and will be discharged home on Lasix 20 mg every Monday and Thursday. She is not a candidate for beta blocker therapy as she has baseline bradycardia and it was felt that calcium channel blockers would probably be of little benefit in management of pulmonary hypertension as it is secondary to elevated left-sided filling pressures related to mitral stenosis.  Discharge Vitals Blood pressure 109/43, pulse 53, temperature 98.8 F (37.1 C), temperature source Oral, resp. rate 20, height 5\' 4"  (1.626 m), weight 164 lb 3.9 oz (74.5 kg), SpO2 94.00%.  Filed Weights   07/12/13 1300 07/13/13 0400 07/14/13 0420  Weight: 166 lb 3.6 oz (75.4 kg) 164 lb 14.5 oz (74.8 kg) 164 lb 3.9 oz (74.5 kg)   Labs  CBC  Recent Labs  07/12/13 0715  WBC 7.1  NEUTROABS 5.7  HGB 10.2*  HCT 32.9*  MCV 79.1  PLT 122*   Basic Metabolic Panel  Recent Labs  07/12/13 0715 07/13/13 0130  NA 139 136*  K 3.6* 4.2  CL 104 99  CO2 24 24  GLUCOSE 111* 65*  BUN 18 18  CREATININE 0.85 0.89  CALCIUM 8.7 8.4   Liver Function Tests  Recent Labs  07/13/13 0130  AST 27  ALT 18  ALKPHOS 74  BILITOT 0.7  PROT 6.3  ALBUMIN 3.1*    Recent Labs  07/13/13 0705  LIPASE 12  AMYLASE 30   Cardiac Enzymes  Recent Labs  07/12/13 1857 07/13/13 0028 07/13/13 0705  TROPONINI 0.30* <0.30 <0.30   Fasting Lipid Panel  Recent Labs  07/13/13 0130  CHOL 156  HDL 31*  LDLCALC 109*  TRIG 79  CHOLHDL 5.0   Thyroid Function Tests  Recent Labs  07/12/13 1350  TSH 2.267    Disposition  Pt is being discharged home  today in good condition.  Follow-up Plans & Appointments      Follow-up Information   Follow up with Dietrich Pates, MD On 08/10/2013. (11:30 AM)    Specialty:  Cardiology   Contact information:   [address] [place] [address] [phone]       Follow up with Tereso Newcomer, PA-C On 07/22/2013. (2:30 PM)    Specialty:  Physician Assistant   Contact information:   [address] [place] [address] [phone]       Follow up with Londell Moh, MD In 1 week. (You will need your coumadin followed up within the next few day.)    Specialty:  Internal Medicine   Contact information:   [address] [place] [address] [phone]      Discharge Medications    Medication List    STOP taking these medications       aspirin 81 MG chewable tablet      TAKE these medications       amiodarone 200 MG tablet  Commonly known as:  PACERONE  Take 100 mg by mouth daily.     cholecalciferol  1000 UNITS tablet  Commonly known as:  VITAMIN D  Take 1,000 Units by mouth at bedtime.     COQ10 PO  Take 300 mg by mouth daily.     furosemide 20 MG tablet  Commonly known as:  LASIX  20mg  on Mondays and Thursdays.     levothyroxine 125 MCG tablet  Commonly known as:  SYNTHROID, LEVOTHROID  Take 62.5 mcg by mouth daily before breakfast.     losartan 100 MG tablet  Commonly known as:  COZAAR  Take 1 tablet (100 mg total) by mouth daily.     nitroGLYCERIN 0.4 MG SL tablet  Commonly known as:  NITROSTAT  Place 0.4 mg under the tongue every 5 (five) minutes as needed for chest pain.     PROBIOTIC DAILY PO  Take 1 tablet by mouth daily.     sertraline 100 MG tablet  Commonly known as:  ZOLOFT  Take 100 mg by mouth daily.     TH FLAX SEED OIL PO  Take 1 capsule by mouth daily.     warfarin 1 MG tablet  Commonly known as:  COUMADIN  Take 1 mg by mouth daily. Take with 0.5 of 5mg  tablet to = 3.5mg /dose      warfarin 5 MG tablet  Commonly known as:  COUMADIN  Take 2.5 mg by mouth daily. Take with 1mg  tablet to = 3.5mg /dose       Outstanding Labs/Studies  None  Duration of Discharge Encounter   Greater than 30 minutes including physician time.  Signed, Nicolasa Ducking NP 07/14/2013, 3:30 PM

## 2013-07-14 NOTE — Progress Notes (Signed)
ANTICOAGULATION CONSULT NOTE - Follow Up Consult  Pharmacy Consult for coumadin Indication: afib w/  Prosthetic mitral valve    Allergies  Allergen Reactions  . Ivp Dye [Iodinated Diagnostic Agents] Itching  . Nitrofurantoin Itching  . Ramipril     unknown    Patient Measurements: Height: 5\' 4"  (162.6 cm) Weight: 164 lb 3.9 oz (74.5 kg) IBW/kg (Calculated) : 54.7  Vital Signs: Temp: 99 F (37.2 C) (02/24 0726) Temp src: Oral (02/24 0726) BP: 131/61 mmHg (02/24 0726) Pulse Rate: 55 (02/24 0800)  Labs:  Recent Labs  07/12/13 0715  07/12/13 1857 07/13/13 0028 07/13/13 0130 07/13/13 0705 07/14/13 0230  HGB 10.2*  --   --   --   --   --   --   HCT 32.9*  --   --   --   --   --   --   PLT 122*  --   --   --   --   --   --   LABPROT 24.5*  --   --   --  28.8*  --  31.5*  INR 2.29*  --   --   --  2.83*  --  3.19*  CREATININE 0.85  --   --   --  0.89  --   --   TROPONINI  --   < > 0.30* <0.30  --  <0.30  --   < > = values in this interval not displayed.  Estimated Creatinine Clearance: 46.5 ml/min (by C-G formula based on Cr of 0.89).   Medications:  Scheduled:  . amiodarone  100 mg Oral Daily  . cholecalciferol  1,000 Units Oral QHS  . feeding supplement (ENSURE COMPLETE)  237 mL Oral BID BM  . FLORA-Q  1 capsule Oral Daily  . furosemide  40 mg Oral Daily  . levothyroxine  62.5 mcg Oral QAC breakfast  . losartan  50 mg Oral Daily  . sertraline  100 mg Oral Daily  . sodium chloride  3 mL Intravenous Q12H  . warfarin  3.5 mg Oral q1800  . Warfarin - Pharmacist Dosing Inpatient   Does not apply q1800    Assessment: 80 yo female here with CP and on coumadin for history of afib and prosthetic mitral valve. Patient is above goal on coumadin (INR= 3.19 w/ trend up) .   Goal of Therapy:  INR 2-3 Monitor platelets by anticoagulation protocol: Yes   Plan:  -Hold coumadin today -Daily PT/INR for now  Harland Germanndrew Shylin Keizer, Pharm D 07/14/2013 10:43 AM

## 2013-07-15 NOTE — Discharge Summary (Signed)
Patricia [name], H 07/15/2013  

## 2013-07-22 ENCOUNTER — Encounter: Payer: Self-pay | Admitting: Physician Assistant

## 2013-07-22 ENCOUNTER — Ambulatory Visit (INDEPENDENT_AMBULATORY_CARE_PROVIDER_SITE_OTHER): Payer: Medicare Other | Admitting: Physician Assistant

## 2013-07-22 ENCOUNTER — Other Ambulatory Visit: Payer: Self-pay | Admitting: *Deleted

## 2013-07-22 VITALS — BP 140/80 | HR 70 | Ht 64.0 in | Wt 168.0 lb

## 2013-07-22 DIAGNOSIS — I509 Heart failure, unspecified: Secondary | ICD-10-CM

## 2013-07-22 DIAGNOSIS — I2510 Atherosclerotic heart disease of native coronary artery without angina pectoris: Secondary | ICD-10-CM

## 2013-07-22 DIAGNOSIS — I421 Obstructive hypertrophic cardiomyopathy: Secondary | ICD-10-CM

## 2013-07-22 DIAGNOSIS — I059 Rheumatic mitral valve disease, unspecified: Secondary | ICD-10-CM

## 2013-07-22 DIAGNOSIS — I5082 Biventricular heart failure: Secondary | ICD-10-CM

## 2013-07-22 DIAGNOSIS — I10 Essential (primary) hypertension: Secondary | ICD-10-CM

## 2013-07-22 DIAGNOSIS — I4891 Unspecified atrial fibrillation: Secondary | ICD-10-CM

## 2013-07-22 NOTE — Patient Instructions (Signed)
WE WILL SEE YOU ON 08/10/13 FOR YOUR APPT WITH DR. ROSS  Your physician recommends that you continue on your current medications as directed. Please refer to the Current Medication list given to you today.

## 2013-07-22 NOTE — Progress Notes (Signed)
[address] [place], [place]  [address] Phone: [phone] Fax:  [phone]  Date:  07/22/2013   ID:  Patricia [name], DOB 1927-03-01, MRN [id number]  PCP:  Londell Moh, MD  Cardiologist:  Dr. Dietrich Pates     History of Present Illness: Patricia [name] is a 80 y.o. female with a hx of HOCM, mild CAD, mitral stenosis s/p bioprosthetic MVR, diastolic and right sided HF, AFib, HL.  Patient was recently admitted 2/22-2/24 with chest pain and acute on chronic CHF.  Cardiac enzymes were negative. No further ischemic testing was warranted. She was diuresed with symptomatic improvement.  Patient was noted to have significant mitral stenosis and significant pulmonary hypertension on echocardiogram. She is not felt to be a candidate for redo mitral valve surgery due to advanced age and comorbid illnesses. Of note, there was concern that use of chronic diuretics to compensate for right-sided heart failure might worsen intracavitary gradient in the setting of hypertrophic cardiomyopathy. She was discharged on low-dose Lasix (twice weekly).  R/L HC (06/2011):  pDx 20, EF > 65%.  RA 10, RV 53/10, PA 53/20 (35), PCWP 24. Echo (07/12/13):  Mod LVH, EF 55-60%, no RWMA, Ao sclerosis (no stenosis), mild AI, mild MR, mod MS (mean 9-13 mmHg), mod LAE, severe RVE, severe reduced RVSF, severe RAE, mod TR, mod PI, PASP 60.  She continued to have significant back and chest pain until she saw her PCP yesterday.  Back Xray confirmed compression fx.  She is now on Tramadol and feels much better.  Her breathing is improved. She is sleeping on 2 pillows.  No PND.  No edema.  No syncope.  Weights have been stable.   Recent Labs: 07/12/2013: Hemoglobin 10.2*; Pro B Natriuretic peptide (BNP) 3163.0*; TSH 2.267  07/13/2013: ALT 18; Creatinine 0.89; HDL Cholesterol 31*; LDL (calc) 109*; Potassium 4.2   Wt Readings from Last 3 Encounters:  07/22/13 168 lb (76.204 kg)  07/14/13 164 lb 3.9 oz (74.5 kg)    04/09/13 171 lb (77.565 kg)     Past Medical History  Diagnosis Date  . CAD (coronary artery disease)   . Mitral valve disorder     a. s/p MVR;  b. 06/2013 Echo: EF 55-60%, no rwma, Ao sclerosis, mild AI, mild MR, miod dil LA, sev dil RV with reduced fxn, sev dil RA, mod TR/PR, PASP (study reviewed by Dr. Verlin Fester- MS felt to be sev, peak grad 39/mean 13).  [name] HTN (hypertension)   . Dyslipidemia   . Aortic stenosis, mild   . Breast cyst   . Hypertrophic cardiomyopathy   . Sleep apnea   . COPD (chronic obstructive pulmonary disease)     "a touch of"; prn o2 at home with activity   . Atrial fibrillation     a. chronic coumadin.  [name] Hypothyroidism   . Anemia   . Chronic right-sided heart failure     Current Outpatient Prescriptions  Medication Sig Dispense Refill  . amiodarone (PACERONE) 200 MG tablet Take 100 mg by mouth daily.      . calcitonin, salmon, (MIACALCIN/FORTICAL) 200 UNIT/ACT nasal spray       . cholecalciferol (VITAMIN D) 1000 UNITS tablet Take 1,000 Units by mouth at bedtime.       . Coenzyme Q10 (COQ10 PO) Take 300 mg by mouth daily.      . Flaxseed, Linseed, (TH FLAX SEED OIL PO) Take 1 capsule by mouth daily.      [name]  furosemide (LASIX) 20 MG tablet 20mg  on Mondays and Thursdays.  30 tablet  3  . levothyroxine (SYNTHROID, LEVOTHROID) 125 MCG tablet Take 62.5 mcg by mouth daily before breakfast.      . losartan (COZAAR) 100 MG tablet Take 1 tablet (100 mg total) by mouth daily.  30 tablet  6  . nitroGLYCERIN (NITROSTAT) 0.4 MG SL tablet Place 0.4 mg under the tongue every 5 (five) minutes as needed for chest pain.      . Probiotic Product (PROBIOTIC DAILY PO) Take 1 tablet by mouth daily.      . sertraline (ZOLOFT) 100 MG tablet Take 100 mg by mouth daily.      . traMADol (ULTRAM) 50 MG tablet       . warfarin (COUMADIN) 1 MG tablet Take 1 mg by mouth daily. Take with 0.5 of 5mg  tablet to = 3.5mg /dose      . warfarin (COUMADIN) 5 MG tablet Take 2.5 mg by mouth daily.  Take with 1mg  tablet to = 3.5mg /dose       No current facility-administered medications for this visit.    Allergies:   Ivp dye; Nitrofurantoin; and Ramipril   Social History:  The patient  reports that she quit smoking about 23 years ago. Her smoking use included Cigarettes. She has a 40 pack-year smoking history. She has never used smokeless tobacco. She reports that she does not drink alcohol or use illicit drugs.   Family History:  The patient's family history includes Aneurysm in her brother; Breast cancer in her sister; CVA in her mother; Cancer in her brother; Diabetes in her brother and mother; Emphysema in her sister; Other in her father.   ROS:  Please see the history of present illness.      All other systems reviewed and negative.   PHYSICAL EXAM: VS:  BP 140/80  Pulse 70  Ht 5\' 4"  (1.626 m)  Wt 168 lb (76.204 kg)  BMI 28.82 kg/m2 Well nourished, well developed, in no acute distress HEENT: normal Neck: no JVD Cardiac:  normal S1, S2; RRR; 2/6 systolic murmur loudest at RUSB Lungs:  clear to auscultation bilaterally, no wheezing, rhonchi or rales Abd: soft, nontender, no hepatomegaly Ext: no edema Skin: warm and dry Neuro:  CNs 2-12 intact, no focal abnormalities noted  EKG:  NSR, HR 70, LAFB, LVH with repol abnormality, no changes     ASSESSMENT AND PLAN:  1. Chronic Diastolic CHF:  She has L and R sided HF in the setting of significant pulmonary HTN and HOCM.  She is doing well since her recent hospital stay.  Breathing is improved. Volume appears to be stable.  Continue low dose Lasix 2 x per week to avoid increasing the intracavitary gradient.  We discussed the importance of weighing daily and to contact us if her weight increases 3 lbs in one day.   2. Mitral Stenosis, s/p MVR:  She has recurrent MS.  She is not felt to be a surgical candidate.  She is currently stable. 3. CAD:  No angina.  4. Hypertrophic Obstructive CM:  Beta blocker has not been used due to  bradycardia. 5. Atrial Fibrillation:  Maintaining NSR.  She remains on Amiodarone.  Recent LFTs and TSH were ok.  Coumadin in managed by primary care.   6. Hypertension:  Controlled. 7. Disposition:  F/u with Dr. Dietrich PatesPaula Ross as planned, later this month.  Signed, Tereso NewcomerScott Weaver, PA-C  07/22/2013 3:10 PM

## 2013-08-08 ENCOUNTER — Ambulatory Visit (INDEPENDENT_AMBULATORY_CARE_PROVIDER_SITE_OTHER): Payer: Medicare Other | Admitting: Emergency Medicine

## 2013-08-08 VITALS — BP 125/82 | HR 65 | Temp 98.5°F | Resp 18 | Wt 160.0 lb

## 2013-08-08 DIAGNOSIS — B029 Zoster without complications: Secondary | ICD-10-CM

## 2013-08-08 MED ORDER — VALACYCLOVIR HCL 1 G PO TABS: 1000.0000 mg | ORAL_TABLET | Freq: Three times a day (TID) | ORAL | Status: DC

## 2013-08-08 NOTE — Patient Instructions (Signed)
Shingles Shingles (herpes zoster) is an infection that is caused by the same virus that causes chickenpox (varicella). The infection causes a painful skin rash and fluid-filled blisters, which eventually break open, crust over, and heal. It may occur in any area of the body, but it usually affects only one side of the body or face. The pain of shingles usually lasts about 1 month. However, some people with shingles may develop long-term (chronic) pain in the affected area of the body. Shingles often occurs many years after the person had chickenpox. It is more common:  In people older than 50 years.  In people with weakened immune systems, such as those with HIV, AIDS, or cancer.  In people taking medicines that weaken the immune system, such as transplant medicines.  In people under great stress. CAUSES  Shingles is caused by the varicella zoster virus (VZV), which also causes chickenpox. After a person is infected with the virus, it can remain in the person's body for years in an inactive state (dormant). To cause shingles, the virus reactivates and breaks out as an infection in a nerve root. The virus can be spread from person to person (contagious) through contact with open blisters of the shingles rash. It will only spread to people who have not had chickenpox. When these people are exposed to the virus, they may develop chickenpox. They will not develop shingles. Once the blisters scab over, the person is no longer contagious and cannot spread the virus to others. SYMPTOMS  Shingles shows up in stages. The initial symptoms may be pain, itching, and tingling in an area of the skin. This pain is usually described as burning, stabbing, or throbbing.In a few days or weeks, a painful red rash will appear in the area where the pain, itching, and tingling were felt. The rash is usually on one side of the body in a band or belt-like pattern. Then, the rash usually turns into fluid-filled blisters. They  will scab over and dry up in approximately 2 3 weeks. Flu-like symptoms may also occur with the initial symptoms, the rash, or the blisters. These may include:  Fever.  Chills.  Headache.  Upset stomach. DIAGNOSIS  Your caregiver will perform a skin exam to diagnose shingles. Skin scrapings or fluid samples may also be taken from the blisters. This sample will be examined under a microscope or sent to a lab for further testing. TREATMENT  There is no specific cure for shingles. Your caregiver will likely prescribe medicines to help you manage the pain, recover faster, and avoid long-term problems. This may include antiviral drugs, anti-inflammatory drugs, and pain medicines. HOME CARE INSTRUCTIONS   Take a cool bath or apply cool compresses to the area of the rash or blisters as directed. This may help with the pain and itching.   Only take over-the-counter or prescription medicines as directed by your caregiver.   Rest as directed by your caregiver.  Keep your rash and blisters clean with mild soap and cool water or as directed by your caregiver.  Do not pick your blisters or scratch your rash. Apply an anti-itch cream or numbing creams to the affected area as directed by your caregiver.  Keep your shingles rash covered with a loose bandage (dressing).  Avoid skin contact with:  Babies.   Pregnant women.   Children with eczema.   Elderly people with transplants.   People with chronic illnesses, such as leukemia or AIDS.   Wear loose-fitting clothing to help ease   the pain of material rubbing against the rash.  Keep all follow-up appointments with your caregiver.If the area involved is on your face, you may receive a referral for follow-up to a specialist, such as an eye doctor (ophthalmologist) or an ear, nose, and throat (ENT) doctor. Keeping all follow-up appointments will help you avoid eye complications, chronic pain, or disability.  SEEK IMMEDIATE MEDICAL  CARE IF:   You have facial pain, pain around the eye area, or loss of feeling on one side of your face.  You have ear pain or ringing in your ear.  You have loss of taste.  Your pain is not relieved with prescribed medicines.   Your redness or swelling spreads.   You have more pain and swelling.  Your condition is worsening or has changed.   You have a feveror persistent symptoms for more than 2 3 days.  You have a fever and your symptoms suddenly get worse. MAKE SURE YOU:  Understand these instructions.  Will watch your condition.  Will get help right away if you are not doing well or get worse. Document Released: 05/07/2005 Document Revised: 01/30/2012 Document Reviewed: 12/20/2011 Exit[employer] Patient Information 2014 [employer].  

## 2013-08-08 NOTE — Progress Notes (Signed)
[facility] [address], [place] [id number] [phone]  Date:  08/08/2013   Name:  Patricia PattyDoris S [name]   DOB:  1926/07/31   MRN:  [id number]  PCP:  Londell MohPHARR,WALTER DAVIDSON, MD    Chief Complaint: Rash   History of Present Illness:  Patricia PattyDoris S [name] is a 80 y.o. very pleasant female patient who presents with the following:  Ill with pain in her left chest for several weeks in fact was hospitalized to rule out an MI.  She noticed a rash in the left chest yesterday and she is experiencing a pruritis and pain associated with the rash.  No cough or coryza.  No fever or chills.  No increased shortness of breath.  No wheezing.  Had prior episode of chicken pox.  No improvement with over the counter medications or other home remedies. Denies other complaint or health concern today.   Patient Active Problem List   Diagnosis Date Noted  . Biventricular CHF (congestive heart failure) 07/22/2013  . Midsternal chest pain 07/14/2013  . Acute on chronic diastolic CHF (congestive heart failure), NYHA class 3 07/14/2013  . Hepatomegaly 07/14/2013  . Acute on chronic systolic right heart failure 07/14/2013  . Aortic sclerosis 07/14/2013  . Anticoagulated on Coumadin 07/14/2013  . Chest pain 07/12/2013  . Intermediate coronary syndrome 07/12/2013  . Protein-calorie malnutrition, severe 03/26/2013  . Diarrhea 03/25/2013  . Dehydration 03/24/2013  . AKI (acute kidney injury) 03/24/2013  . Thrombocytopenia, unspecified 03/24/2013  . Chronic diarrhea 03/25/2012  . OSA (obstructive sleep apnea) 11/06/2011  . Exercise hypoxemia 07/11/2011  . COPD, mild 07/11/2011  . Pulmonary hypertension, secondary 07/11/2011  . Encounter for long-term (current) use of anticoagulants 06/26/2011  . Dyspnea 10/27/2010  . Hypertrophic obstructive cardiomyopathy(425.11) 11/10/2009  . DYSLIPIDEMIA 01/06/2009  . HYPERTENSION, UNSPECIFIED 01/06/2009  . CAD 01/06/2009  . Mitral valve disorders  01/06/2009  . AORTIC STENOSIS, MILD 01/06/2009  . ATRIAL FIBRILLATION, HX OF 01/06/2009  . BREAST CYST, HX OF 01/06/2009  . MITRAL VALVE REPLACEMENT, HX OF 01/06/2009    Past Medical History  Diagnosis Date  . CAD (coronary artery disease)   . Mitral valve disorder     a. s/p MVR;  b. 06/2013 Echo: EF 55-60%, no rwma, Ao sclerosis, mild AI, mild MR, miod dil LA, sev dil RV with reduced fxn, sev dil RA, mod TR/PR, PASP 60mmHg (study reviewed by Dr. Verlin FesterKN- MS felt to be sev, peak grad 39/mean 13).  [name]. HTN (hypertension)   . Dyslipidemia   . Aortic stenosis, mild   . Breast cyst   . Hypertrophic cardiomyopathy   . Sleep apnea   . COPD (chronic obstructive pulmonary disease)     "a touch of"; prn o2 at home with activity   . Atrial fibrillation     a. chronic coumadin.  [name]. Hypothyroidism   . Anemia   . Chronic right-sided heart failure     Past Surgical History  Procedure Laterality Date  . Laparoscopic cholecystectomy  2009  . Cataract extraction  2003    bilateral  . Mitral valve replacement  2007  . Neck mass excision  2003    benign  . Colonoscopy    . Broken wrist      left, metal implanted   . Broken ankle      right, metal implanted   . Cardiac catheterization  06/2011  . Breast surgery      bilateral cysts x3 removed, benign   .  Tee without cardioversion  01/31/2012    Procedure: TRANSESOPHAGEAL ECHOCARDIOGRAM (TEE);  Surgeon: Pricilla Riffle, MD;  Location: [facility] ENDOSCOPY;  Service: Cardiovascular;  Laterality: N/A;  . Cardioversion N/A 01/16/2013    Procedure: CARDIOVERSION;  Surgeon: Vesta Mixer, MD;  Location: [facility] ENDOSCOPY;  Service: Cardiovascular;  Laterality: N/A;    History  Substance Use Topics  . Smoking status: Former Smoker -- 1.00 packs/day for 40 years    Types: Cigarettes    Quit date: 05/21/1990  . Smokeless tobacco: Never Used  . Alcohol Use: No    Family History  Problem Relation Age of Onset  . Diabetes Mother   . CVA Mother   . Other Father    . Diabetes Brother   . Cancer Brother     throat ca, smoker  . Aneurysm Brother     brain aneurysm  . Breast cancer Sister   . Emphysema Sister     smoker    Allergies  Allergen Reactions  . Ivp Dye [Iodinated Diagnostic Agents] Itching  . Nitrofurantoin Itching  . Ramipril     unknown    Medication list has been reviewed and updated.  Current Outpatient Prescriptions on File Prior to Visit  Medication Sig Dispense Refill  . amiodarone (PACERONE) 200 MG tablet Take 100 mg by mouth daily.      . calcitonin, salmon, (MIACALCIN/FORTICAL) 200 UNIT/ACT nasal spray       . cholecalciferol (VITAMIN D) 1000 UNITS tablet Take 1,000 Units by mouth at bedtime.       . Coenzyme Q10 (COQ10 PO) Take 300 mg by mouth daily.      . Flaxseed, Linseed, (TH FLAX SEED OIL PO) Take 1 capsule by mouth daily.      . furosemide (LASIX) 20 MG tablet 20mg  on Mondays and Thursdays.  30 tablet  3  . levothyroxine (SYNTHROID, LEVOTHROID) 125 MCG tablet Take 62.5 mcg by mouth daily before breakfast.      . losartan (COZAAR) 100 MG tablet Take 1 tablet (100 mg total) by mouth daily.  30 tablet  6  . nitroGLYCERIN (NITROSTAT) 0.4 MG SL tablet Place 0.4 mg under the tongue every 5 (five) minutes as needed for chest pain.      . Probiotic Product (PROBIOTIC DAILY PO) Take 1 tablet by mouth daily.      . sertraline (ZOLOFT) 100 MG tablet Take 100 mg by mouth daily.      . traMADol (ULTRAM) 50 MG tablet       . warfarin (COUMADIN) 1 MG tablet Take 1 mg by mouth daily. Take with 0.5 of 5mg  tablet to = 3.5mg /dose      . warfarin (COUMADIN) 5 MG tablet Take 2.5 mg by mouth daily. Take with 1mg  tablet to = 3.5mg /dose       No current facility-administered medications on file prior to visit.    Review of Systems:  As per HPI, otherwise negative.    Physical Examination: Filed Vitals:   08/08/13 1223  BP: 125/82  Pulse: 65  Temp: 98.5 F (36.9 C)  Resp: 18   Filed Vitals:   08/08/13 1223  Weight: 160  lb (72.576 kg)   Body mass index is 27.45 kg/(m^2). Ideal Body Weight:     GEN: WDWN, NAD, Non-toxic, A & O x 3 HEENT: Atraumatic, Normocephalic. Neck supple. No masses, No LAD. Ears and Nose: No external deformity. CV: RRR, No M/G/R. No JVD. No thrill. No extra heart sounds. PULM: CTA B,  no wheezes, crackles, rhonchi. No retractions. No resp. distress. No accessory muscle use. ABD: S, NT, ND, +BS. No rebound. No HSM. EXTR: No c/c/e NEURO Normal gait.  PSYCH: Normally interactive. Conversant. Not depressed or anxious appearing.  Calm demeanor.  Skin;  Rash from spine to sternum in 5th ICS consistent with shingles   Assessment and Plan: Shingles Valtrex Continue home pain medications  Signed,  Phillips Odor, MD

## 2013-08-10 ENCOUNTER — Encounter: Payer: Self-pay | Admitting: Internal Medicine

## 2013-08-10 ENCOUNTER — Ambulatory Visit (INDEPENDENT_AMBULATORY_CARE_PROVIDER_SITE_OTHER): Payer: Medicare Other | Admitting: Internal Medicine

## 2013-08-10 VITALS — BP 117/50 | HR 71 | Ht 64.0 in | Wt 153.0 lb

## 2013-08-10 DIAGNOSIS — I50813 Acute on chronic right heart failure: Secondary | ICD-10-CM

## 2013-08-10 DIAGNOSIS — I359 Nonrheumatic aortic valve disorder, unspecified: Secondary | ICD-10-CM

## 2013-08-10 DIAGNOSIS — I5023 Acute on chronic systolic (congestive) heart failure: Secondary | ICD-10-CM

## 2013-08-10 DIAGNOSIS — I2510 Atherosclerotic heart disease of native coronary artery without angina pectoris: Secondary | ICD-10-CM

## 2013-08-10 LAB — BASIC METABOLIC PANEL
BUN: 19 mg/dL (ref 6–23)
CALCIUM: 8.7 mg/dL (ref 8.4–10.5)
CO2: 26 mEq/L (ref 19–32)
Chloride: 104 mEq/L (ref 96–112)
Creatinine, Ser: 0.8 mg/dL (ref 0.4–1.2)
GFR: 70.55 mL/min (ref >60.00)
Glucose, Bld: 98 mg/dL (ref 70–99)
Potassium: 3.5 mEq/L (ref 3.5–5.1)
SODIUM: 138 meq/L (ref 135–145)

## 2013-08-10 LAB — BRAIN NATRIURETIC PEPTIDE: Pro B Natriuretic peptide (BNP): 955 pg/mL — ABNORMAL HIGH (ref 0.0–100.0)

## 2013-08-10 NOTE — Progress Notes (Signed)
HPI Patient is a 80 year old with a history of HOCM, very mild CAD hypertension, mitral stenosis (s/p replacement with bioprosthesis), atrial fibrillation  dyslipidemia. Limited stress echo showed no signif increase in LVOT gradient. She has had continued SOB CT of chest showed no fibrosis. R and L heart cath. showed no critical CAD, MR through mitral prosthesis of uncertain severity. PA pressures were increased with RA pressure of 53/10 and PCWP 24  Patient ruled out for myocardial infarction. She did not have any further chest pain and in the setting of previously nonobstructive CAD on catheterization in 2013, without objective evidence of ischemia now, decision was made not to pursue any additional ischemic testing. With intravenous diuresis, she had symptomatic improvement in dyspnea and is -3 L this admission with a 2 pound reduction in weight to 164 pounds at discharge. . Echocardiography showed severe right heart failure and severe mitral stenosis with a peak transmitral gradient of 39 mm mercury and a mean gradient of 13 mm mercury. This was up from her previous mean transmitral gradient of 10 mm mercury in 2013. She also had significant pulmonary hypertension with a right ventricular systolic pressure of 60 mm mercury. In the setting of severe left ventricular hypertrophy, EF was estimated to be greater than 75% upon review. Patient is not felt to be a candidate for redo mitral valve surgery given her advanced age and comorbidities.  She saw Wende Mott once after d/c  Since then she has been seen by Dr Renne Crigler  One week she took Lasix qd  Now she is taking 2x per wk.    Since d/c the patient developed severe R and L sided CP  This weekend she finally developed vesicles consistent with shingles.  Started Valtrex.  Felt better after first dose.   I  Allergies  Allergen Reactions  . Ivp Dye [Iodinated Diagnostic Agents] Itching  . Nitrofurantoin Itching  . Ramipril     unknown    Current Outpatient  Prescriptions  Medication Sig Dispense Refill  . amiodarone (PACERONE) 200 MG tablet Take 100 mg by mouth daily.      . calcitonin, salmon, (MIACALCIN/FORTICAL) 200 UNIT/ACT nasal spray       . cholecalciferol (VITAMIN D) 1000 UNITS tablet Take 1,000 Units by mouth at bedtime.       . Coenzyme Q10 (COQ10 PO) Take 300 mg by mouth daily.      . Flaxseed, Linseed, (TH FLAX SEED OIL PO) Take 1 capsule by mouth daily.      . furosemide (LASIX) 20 MG tablet 20mg  on Mondays and Thursdays.  30 tablet  3  . levothyroxine (SYNTHROID, LEVOTHROID) 125 MCG tablet Take 62.5 mcg by mouth daily before breakfast.      . losartan (COZAAR) 100 MG tablet Take 1 tablet (100 mg total) by mouth daily.  30 tablet  6  . nitroGLYCERIN (NITROSTAT) 0.4 MG SL tablet Place 0.4 mg under the tongue every 5 (five) minutes as needed for chest pain.      . Probiotic Product (PROBIOTIC DAILY PO) Take 1 tablet by mouth daily.      . sertraline (ZOLOFT) 100 MG tablet Take 100 mg by mouth daily.      . traMADol (ULTRAM) 50 MG tablet       . valACYclovir (VALTREX) 1000 MG tablet Take 1 tablet (1,000 mg total) by mouth 3 (three) times daily.  21 tablet  0  . warfarin (COUMADIN) 1 MG tablet Take 1 mg by mouth  daily. Take with 0.5 of 5mg  tablet to = 3.5mg /dose      . warfarin (COUMADIN) 5 MG tablet Take 2.5 mg by mouth daily. Take with 1mg  tablet to = 3.5mg /dose       No current facility-administered medications for this visit.    Past Medical History  Diagnosis Date  . CAD (coronary artery disease)   . Mitral valve disorder     a. s/p MVR;  b. 06/2013 Echo: EF 55-60%, no rwma, Ao sclerosis, mild AI, mild MR, miod dil LA, sev dil RV with reduced fxn, sev dil RA, mod TR/PR, PASP 60mmHg (study reviewed by Dr. Verlin FesterKN- MS felt to be sev, peak grad 39/mean 13).  [name]. HTN (hypertension)   . Dyslipidemia   . Aortic stenosis, mild   . Breast cyst   . Hypertrophic cardiomyopathy   . Sleep apnea   . COPD (chronic obstructive pulmonary disease)      "a touch of"; prn o2 at home with activity   . Atrial fibrillation     a. chronic coumadin.  [name]. Hypothyroidism   . Anemia   . Chronic right-sided heart failure     Past Surgical History  Procedure Laterality Date  . Laparoscopic cholecystectomy  2009  . Cataract extraction  2003    bilateral  . Mitral valve replacement  2007  . Neck mass excision  2003    benign  . Colonoscopy    . Broken wrist      left, metal implanted   . Broken ankle      right, metal implanted   . Cardiac catheterization  06/2011  . Breast surgery      bilateral cysts x3 removed, benign   . Tee without cardioversion  01/31/2012    Procedure: TRANSESOPHAGEAL ECHOCARDIOGRAM (TEE);  Surgeon: Pricilla RifflePaula V Nile Dorning, MD;  Location: [facility] ENDOSCOPY;  Service: Cardiovascular;  Laterality: N/A;  . Cardioversion N/A 01/16/2013    Procedure: CARDIOVERSION;  Surgeon: Vesta MixerPhilip J Nahser, MD;  [facility];  Service: Cardiovascular;  Laterality: N/A;    Family History  Problem Relation Age of Onset  . Diabetes Mother   . CVA Mother   . Other Father   . Diabetes Brother   . Cancer Brother     throat ca, smoker  . Aneurysm Brother     brain aneurysm  . Breast cancer Sister   . Emphysema Sister     smoker    History   Social History  . Marital Status: Married    Spouse Name: N/A    Number of Children: N/A  . Years of Education: N/A   Occupational History  . Retired    Social History Main Topics  . Smoking status: Former Smoker -- 1.00 packs/day for 40 years    Types: Cigarettes    Quit date: 05/21/1990  . Smokeless tobacco: Never Used  . Alcohol Use: No  . Drug Use: No  . Sexual Activity: No   Other Topics Concern  . Not on file   Social History Narrative   Lives with her husband.  Drives and walks around without assist device.            Review of Systems:  All systems reviewed.  They are negative to the above problem except as previously stated.  Vital Signs: BP 117/50  Pulse 71  Ht 5'  4" (1.626 m)  Wt 153 lb (69.4 kg)  BMI 26.25 kg/m2  SpO2 89%  Physical Exam Patient is in NAD  HEENT:  Normocephalic, atraumatic. EOMI, PERRLA.  Neck: JVP is normal.  No bruits.  Lungs: clear to auscultation. No rales no wheezes.  Chest:  Erythema and vesicles on L chest to back (dermatome) Heart: Regular rate and rhythm. Normal S1, S2. No S3.   Gr III/Vi systolic murmur LSB to base  Abdomen:  Supple, nontender. Normal bowel sounds. No masses. No hepatomegaly.  Extremities:   Good distal pulses throughout. No lower extremity edema.  Musculoskeletal :moving all extremities.  Neuro:   alert and oriented x3.  CN II-XII grossly intact.  EKG SR 62 bpm  First degree AV block  PR 218 msec.  LVH  ST T wave changes consistent with repol abnormalitiy. Assessment and Plan: 1  Atrial fibrllation Keep on current meds.   2.  CAD  Mild by cath in 07 3.  MV replacement.  Echo on this admit showed peak and mean gradients of 39 and 13 mm Hg respectivel consistent with severe MS.  RV is larger  Estimated PAP is increased Will check BNP and BMET today  Taking lasix 2x per wk  4.  Hypertrophic cardiomyopathy  Watch volume   4.  GI  Resolved   4.  Pulm  Using CPAP and O2 at night.

## 2013-08-10 NOTE — Patient Instructions (Signed)
Your physician recommends that you HAVE LAB WORK TODAY 

## 2013-08-12 ENCOUNTER — Other Ambulatory Visit: Payer: Self-pay | Admitting: *Deleted

## 2013-08-12 DIAGNOSIS — R7989 Other specified abnormal findings of blood chemistry: Secondary | ICD-10-CM

## 2013-08-12 MED ORDER — FUROSEMIDE 20 MG PO TABS: ORAL_TABLET | ORAL | Status: DC

## 2013-08-15 ENCOUNTER — Ambulatory Visit (INDEPENDENT_AMBULATORY_CARE_PROVIDER_SITE_OTHER): Payer: Medicare Other | Admitting: Internal Medicine

## 2013-08-15 VITALS — BP 124/76 | HR 76 | Temp 98.0°F | Resp 17 | Ht 64.0 in | Wt 158.0 lb

## 2013-08-15 DIAGNOSIS — S61217A Laceration without foreign body of left little finger without damage to nail, initial encounter: Secondary | ICD-10-CM

## 2013-08-15 DIAGNOSIS — S61209A Unspecified open wound of unspecified finger without damage to nail, initial encounter: Secondary | ICD-10-CM | POA: Diagnosis not present

## 2013-08-15 DIAGNOSIS — M79609 Pain in unspecified limb: Secondary | ICD-10-CM | POA: Diagnosis not present

## 2013-08-15 DIAGNOSIS — Z23 Encounter for immunization: Secondary | ICD-10-CM | POA: Diagnosis not present

## 2013-08-15 NOTE — Progress Notes (Signed)
Patient ID: Patricia PattyDoris S [name] MRN: [id number], DOB: April 04, 1927, 80 y.o. Date of Encounter: 08/15/2013, 4:15 PM   PROCEDURE NOTE: Verbal consent obtained. Sterile technique employed. Numbing: Anesthesia obtained with 2% lidocaine plain.   Cleansed with soap and water. Irrigated.  Wound explored, no deep structures involved, no foreign bodies.   Wound repaired with # 5 SI sutures.  Hemostasis obtained. Wound cleansed and dressed.  Wound care instructions including precautions covered with patient. Handout given.  Anticipate suture removal in 7-10 days  Patricia MoodyHeather Goldman Birchall, PA-C 08/15/2013 4:15 PM

## 2013-08-15 NOTE — Progress Notes (Signed)
Subjective:   This chart was scribed for Patricia Siaobert Cleora Karnik, MD by Patricia [name], [facility] Scribe. This patient was seen in room 6 and the patient's care was started 2:45 PM.    Patient ID: Patricia [name], female    DOB: 04-Apr-1927, 80 y.o.   MRN: [id number]  Chief Complaint  Patient presents with  . Finger Injury     HPI  HPI Comments: Patricia [name] is a 80 y.o. female who [facility] complaining of a laceration to L 5th digit sustained just prior to arrival. Pt states she is able to bend her finger without difficulty. States she injured her finger after falling to the ground and catching her finger in on a door latch. Pt presents with the area wrapped in a bandage with bleeding controlled. At this time she denies any weakness, numbness, fever, or chills. Pt has no other concerns or complaints this visit. She landed on her left posterior hip area and is tender but has no change in her gait. The back of her head also struck the ground but there was no loss of consciousness.  Past Medical History  Diagnosis Date  . CAD (coronary artery disease)   . Mitral valve disorder     a. s/p MVR;  b. 06/2013 Echo: EF 55-60%, no rwma, Ao sclerosis, mild AI, mild MR, miod dil LA, sev dil RV with reduced fxn, sev dil RA, mod TR/PR, PASP 60mmHg (study reviewed by Dr. Verlin FesterKN- MS felt to be sev, peak grad 39/mean 13).  Patricia [name]. HTN (hypertension)   . Dyslipidemia   . Aortic stenosis, mild   . Breast cyst   . Hypertrophic cardiomyopathy   . Sleep apnea   . COPD (chronic obstructive pulmonary disease)     "a touch of"; prn o2 at home with activity   . Atrial fibrillation     a. chronic coumadin.  Patricia [name]. Hypothyroidism   . Anemia   . Chronic right-sided heart failure     Review of Systems  Constitutional: Negative for fever and chills.  HENT: Negative for congestion.   Eyes: Negative for redness.  Respiratory: Negative for cough.   Skin: Positive for wound.  Negative for rash.  Psychiatric/Behavioral: Negative for confusion.    Triage Vitals: BP 124/76  Pulse 76  Temp(Src) 98 F (36.7 C) (Oral)  Resp 17  Ht 5\' 4"  (1.626 m)  Wt 158 lb (71.668 kg)  BMI 27.11 kg/m2  SpO2 85%   Objective:   Physical Exam  Nursing note and vitals reviewed. Constitutional: She is oriented to person, place, and time. She appears well-developed and well-nourished.  HENT:  Head: Normocephalic and atraumatic.  Mouth/Throat: Oropharynx is clear and moist.  There is no abrasion on the back of the head  Eyes: Conjunctivae and EOM are normal. Pupils are equal, round, and reactive to light.  Neck: Normal range of motion.  Cardiovascular: Normal rate.   Pulmonary/Chest: Effort normal.  Musculoskeletal: Normal range of motion.  The left hip has a good range of motion without swelling or ecchymoses  Neurological: She is alert and oriented to person, place, and time. No cranial nerve deficit.  Sensation intact  Skin: Skin is warm and dry.  L 5th finger has an avulsion laceration over the palm aspect of the middle phalynx that is 1 cm x 2 cm Finger has FROM No pain with resisted flexion  Psychiatric: She has a normal mood and affect. Her behavior  is normal. Thought content normal.       Assessment & Plan:  Laceration of fifth finger, left - Plan: Tdap vaccine greater than or equal to 7yo IM  Minimal abrasions  Plan-wound care with suture removal as noted Tdap

## 2013-08-20 ENCOUNTER — Inpatient Hospital Stay (HOSPITAL_COMMUNITY)
Admission: EM | Admit: 2013-08-20 | Discharge: 2013-08-25 | DRG: 551 | Disposition: A | Discharge status: Skilled Nursing Facility | Payer: Medicare Other | Attending: Pulmonary Disease | Admitting: Pulmonary Disease

## 2013-08-20 ENCOUNTER — Encounter (HOSPITAL_COMMUNITY): Payer: Self-pay | Admitting: Emergency Medicine

## 2013-08-20 ENCOUNTER — Emergency Department (HOSPITAL_COMMUNITY): Payer: Medicare Other

## 2013-08-20 DIAGNOSIS — Z9119 Patient's noncompliance with other medical treatment and regimen: Secondary | ICD-10-CM

## 2013-08-20 DIAGNOSIS — J9620 Acute and chronic respiratory failure, unspecified whether with hypoxia or hypercapnia: Secondary | ICD-10-CM | POA: Diagnosis present

## 2013-08-20 DIAGNOSIS — K625 Hemorrhage of anus and rectum: Secondary | ICD-10-CM | POA: Diagnosis present

## 2013-08-20 DIAGNOSIS — S22009A Unspecified fracture of unspecified thoracic vertebra, initial encounter for closed fracture: Principal | ICD-10-CM | POA: Diagnosis present

## 2013-08-20 DIAGNOSIS — IMO0002 Reserved for concepts with insufficient information to code with codable children: Secondary | ICD-10-CM | POA: Diagnosis present

## 2013-08-20 DIAGNOSIS — K529 Noninfective gastroenteritis and colitis, unspecified: Secondary | ICD-10-CM

## 2013-08-20 DIAGNOSIS — E860 Dehydration: Secondary | ICD-10-CM

## 2013-08-20 DIAGNOSIS — I5082 Biventricular heart failure: Secondary | ICD-10-CM

## 2013-08-20 DIAGNOSIS — Z952 Presence of prosthetic heart valve: Secondary | ICD-10-CM

## 2013-08-20 DIAGNOSIS — I200 Unstable angina: Secondary | ICD-10-CM

## 2013-08-20 DIAGNOSIS — I5033 Acute on chronic diastolic (congestive) heart failure: Secondary | ICD-10-CM

## 2013-08-20 DIAGNOSIS — IMO0001 Reserved for inherently not codable concepts without codable children: Secondary | ICD-10-CM

## 2013-08-20 DIAGNOSIS — R791 Abnormal coagulation profile: Secondary | ICD-10-CM | POA: Diagnosis present

## 2013-08-20 DIAGNOSIS — R0789 Other chest pain: Secondary | ICD-10-CM

## 2013-08-20 DIAGNOSIS — Z87891 Personal history of nicotine dependence: Secondary | ICD-10-CM

## 2013-08-20 DIAGNOSIS — Z872 Personal history of diseases of the skin and subcutaneous tissue: Secondary | ICD-10-CM

## 2013-08-20 DIAGNOSIS — W19XXXA Unspecified fall, initial encounter: Secondary | ICD-10-CM

## 2013-08-20 DIAGNOSIS — R0902 Hypoxemia: Secondary | ICD-10-CM

## 2013-08-20 DIAGNOSIS — I959 Hypotension, unspecified: Secondary | ICD-10-CM | POA: Diagnosis not present

## 2013-08-20 DIAGNOSIS — I2510 Atherosclerotic heart disease of native coronary artery without angina pectoris: Secondary | ICD-10-CM

## 2013-08-20 DIAGNOSIS — Z9181 History of falling: Secondary | ICD-10-CM

## 2013-08-20 DIAGNOSIS — I440 Atrioventricular block, first degree: Secondary | ICD-10-CM | POA: Diagnosis present

## 2013-08-20 DIAGNOSIS — B029 Zoster without complications: Secondary | ICD-10-CM | POA: Diagnosis present

## 2013-08-20 DIAGNOSIS — E039 Hypothyroidism, unspecified: Secondary | ICD-10-CM | POA: Diagnosis present

## 2013-08-20 DIAGNOSIS — I2789 Other specified pulmonary heart diseases: Secondary | ICD-10-CM | POA: Diagnosis present

## 2013-08-20 DIAGNOSIS — I482 Chronic atrial fibrillation, unspecified: Secondary | ICD-10-CM

## 2013-08-20 DIAGNOSIS — Z7901 Long term (current) use of anticoagulants: Secondary | ICD-10-CM

## 2013-08-20 DIAGNOSIS — I7101 Dissection of thoracic aorta: Secondary | ICD-10-CM | POA: Diagnosis present

## 2013-08-20 DIAGNOSIS — J4489 Other specified chronic obstructive pulmonary disease: Secondary | ICD-10-CM | POA: Diagnosis present

## 2013-08-20 DIAGNOSIS — Z6827 Body mass index (BMI) 27.0-27.9, adult: Secondary | ICD-10-CM

## 2013-08-20 DIAGNOSIS — R0600 Dyspnea, unspecified: Secondary | ICD-10-CM

## 2013-08-20 DIAGNOSIS — W010XXA Fall on same level from slipping, tripping and stumbling without subsequent striking against object, initial encounter: Secondary | ICD-10-CM | POA: Diagnosis present

## 2013-08-20 DIAGNOSIS — I421 Obstructive hypertrophic cardiomyopathy: Secondary | ICD-10-CM

## 2013-08-20 DIAGNOSIS — I71012 Dissection of descending thoracic aorta: Secondary | ICD-10-CM

## 2013-08-20 DIAGNOSIS — R5381 Other malaise: Secondary | ICD-10-CM | POA: Diagnosis present

## 2013-08-20 DIAGNOSIS — Z515 Encounter for palliative care: Secondary | ICD-10-CM

## 2013-08-20 DIAGNOSIS — I359 Nonrheumatic aortic valve disorder, unspecified: Secondary | ICD-10-CM

## 2013-08-20 DIAGNOSIS — Z66 Do not resuscitate: Secondary | ICD-10-CM

## 2013-08-20 DIAGNOSIS — N179 Acute kidney failure, unspecified: Secondary | ICD-10-CM

## 2013-08-20 DIAGNOSIS — D696 Thrombocytopenia, unspecified: Secondary | ICD-10-CM

## 2013-08-20 DIAGNOSIS — I10 Essential (primary) hypertension: Secondary | ICD-10-CM

## 2013-08-20 DIAGNOSIS — E41 Nutritional marasmus: Secondary | ICD-10-CM | POA: Diagnosis present

## 2013-08-20 DIAGNOSIS — J449 Chronic obstructive pulmonary disease, unspecified: Secondary | ICD-10-CM

## 2013-08-20 DIAGNOSIS — R079 Chest pain, unspecified: Secondary | ICD-10-CM

## 2013-08-20 DIAGNOSIS — J9610 Chronic respiratory failure, unspecified whether with hypoxia or hypercapnia: Secondary | ICD-10-CM

## 2013-08-20 DIAGNOSIS — G4733 Obstructive sleep apnea (adult) (pediatric): Secondary | ICD-10-CM

## 2013-08-20 DIAGNOSIS — I4891 Unspecified atrial fibrillation: Secondary | ICD-10-CM | POA: Diagnosis present

## 2013-08-20 DIAGNOSIS — Z8679 Personal history of other diseases of the circulatory system: Secondary | ICD-10-CM

## 2013-08-20 DIAGNOSIS — E43 Unspecified severe protein-calorie malnutrition: Secondary | ICD-10-CM

## 2013-08-20 DIAGNOSIS — I5023 Acute on chronic systolic (congestive) heart failure: Secondary | ICD-10-CM

## 2013-08-20 DIAGNOSIS — Z91199 Patient's noncompliance with other medical treatment and regimen due to unspecified reason: Secondary | ICD-10-CM

## 2013-08-20 DIAGNOSIS — Z9981 Dependence on supplemental oxygen: Secondary | ICD-10-CM

## 2013-08-20 DIAGNOSIS — Z01810 Encounter for preprocedural cardiovascular examination: Secondary | ICD-10-CM

## 2013-08-20 DIAGNOSIS — R160 Hepatomegaly, not elsewhere classified: Secondary | ICD-10-CM

## 2013-08-20 DIAGNOSIS — R197 Diarrhea, unspecified: Secondary | ICD-10-CM

## 2013-08-20 DIAGNOSIS — Z9849 Cataract extraction status, unspecified eye: Secondary | ICD-10-CM

## 2013-08-20 DIAGNOSIS — I509 Heart failure, unspecified: Secondary | ICD-10-CM | POA: Diagnosis present

## 2013-08-20 DIAGNOSIS — E785 Hyperlipidemia, unspecified: Secondary | ICD-10-CM

## 2013-08-20 DIAGNOSIS — I059 Rheumatic mitral valve disease, unspecified: Secondary | ICD-10-CM

## 2013-08-20 DIAGNOSIS — I50813 Acute on chronic right heart failure: Secondary | ICD-10-CM

## 2013-08-20 DIAGNOSIS — I71019 Dissection of thoracic aorta, unspecified: Secondary | ICD-10-CM | POA: Diagnosis present

## 2013-08-20 DIAGNOSIS — Z79899 Other long term (current) drug therapy: Secondary | ICD-10-CM

## 2013-08-20 DIAGNOSIS — S22080A Wedge compression fracture of T11-T12 vertebra, initial encounter for closed fracture: Secondary | ICD-10-CM

## 2013-08-20 DIAGNOSIS — Z9889 Other specified postprocedural states: Secondary | ICD-10-CM

## 2013-08-20 LAB — CBC WITH DIFFERENTIAL/PLATELET
Basophils Absolute: 0 10*3/uL (ref 0.0–0.1)
Basophils Relative: 0 % (ref 0–1)
Eosinophils Absolute: 0.1 10*3/uL (ref 0.0–0.7)
Eosinophils Relative: 1 % (ref 0–5)
HCT: 35.6 % — ABNORMAL LOW (ref 36.0–46.0)
HEMOGLOBIN: 11 g/dL — AB (ref 12.0–15.0)
LYMPHS ABS: 0.6 10*3/uL — AB (ref 0.7–4.0)
Lymphocytes Relative: 5 % — ABNORMAL LOW (ref 12–46)
MCH: 24.2 pg — ABNORMAL LOW (ref 26.0–34.0)
MCHC: 30.9 g/dL (ref 30.0–36.0)
MCV: 78.4 fL (ref 78.0–100.0)
MONOS PCT: 6 % (ref 3–12)
Monocytes Absolute: 0.7 10*3/uL (ref 0.1–1.0)
Neutro Abs: 10.3 10*3/uL — ABNORMAL HIGH (ref 1.7–7.7)
Neutrophils Relative %: 88 % — ABNORMAL HIGH (ref 43–77)
Platelets: 168 10*3/uL (ref 150–400)
RBC: 4.54 MIL/uL (ref 3.87–5.11)
RDW: 20 % — ABNORMAL HIGH (ref 11.5–15.5)
WBC: 11.8 10*3/uL — AB (ref 4.0–10.5)

## 2013-08-20 LAB — PROTIME-INR
INR: 2.05 — ABNORMAL HIGH (ref 0.00–1.49)
Prothrombin Time: 22.5 seconds — ABNORMAL HIGH (ref 11.6–15.2)

## 2013-08-20 LAB — BASIC METABOLIC PANEL
BUN: 25 mg/dL — ABNORMAL HIGH (ref 6–23)
CO2: 24 mEq/L (ref 19–32)
Calcium: 9.3 mg/dL (ref 8.4–10.5)
Chloride: 100 mEq/L (ref 96–112)
Creatinine, Ser: 0.82 mg/dL (ref 0.50–1.10)
GFR calc Af Amer: 74 mL/min — ABNORMAL LOW (ref >90)
GFR, EST NON AFRICAN AMERICAN: 64 mL/min — AB (ref >90)
Glucose, Bld: 117 mg/dL — ABNORMAL HIGH (ref 70–99)
Potassium: 4.5 mEq/L (ref 3.7–5.3)
SODIUM: 138 meq/L (ref 137–147)

## 2013-08-20 LAB — TYPE AND SCREEN
ABO/RH(D): O POS
Antibody Screen: NEGATIVE

## 2013-08-20 MED ORDER — IOHEXOL 350 MG/ML SOLN
100.0000 mL | Freq: Once | INTRAVENOUS | Status: AC | PRN
  Administered 2013-08-20: 100 mL via INTRAVENOUS

## 2013-08-20 MED ORDER — FENTANYL CITRATE 0.05 MG/ML IJ SOLN
25.0000 ug | INTRAMUSCULAR | Status: AC | PRN
  Administered 2013-08-20 (×2): 25 ug via INTRAVENOUS
  Filled 2013-08-20 (×2): qty 2

## 2013-08-20 MED ORDER — FENTANYL CITRATE 0.05 MG/ML IJ SOLN: 50.0000 ug | INTRAMUSCULAR | Status: DC | PRN

## 2013-08-20 MED ORDER — DIPHENHYDRAMINE HCL 50 MG/ML IJ SOLN
50.0000 mg | Freq: Once | INTRAMUSCULAR | Status: AC
  Administered 2013-08-20: 50 mg via INTRAVENOUS
  Filled 2013-08-20: qty 1

## 2013-08-20 NOTE — ED Notes (Signed)
Pt returned from imaging. Pain 6/10 due to movement.

## 2013-08-20 NOTE — ED Notes (Signed)
Dr. Pollina at bedside   

## 2013-08-20 NOTE — ED Notes (Signed)
Patient stated she was standing waiting for her husband to get her breathing machine and she just spun around and fell backwards.  Denies LOC  Was able to get up by herself.  C/o right buttock pain and lower back pain

## 2013-08-20 NOTE — ED Notes (Signed)
Report given to Kim K., RN 

## 2013-08-20 NOTE — ED Notes (Signed)
Pt started discussing her shingles; RN asked pt if she had shingles. Pt actively has shingles but rash has cleared. Pt taking meds; Precautions placed on door and [facility] staff alerted.

## 2013-08-20 NOTE — ED Provider Notes (Signed)
CSN: [id number]     Arrival date & time 08/20/13  2046 History  This chart was scribed for non-physician practitioner, Marlon Pel, PA-C working with Gilda Crease, MD by Greggory Stallion, ED scribe. This patient was seen in room TR11C/TR11C and the patient's care was started at 9:01 PM.   Chief Complaint  Patient presents with  . Fall   The history is provided by the patient. No language interpreter was used.   DAUGHTER IS POA- SAYS PATIENT HAS DNR/DNI, will bring Advanced Directive tomorrow  HPI Comments: Patricia [name] is a 80 y.o. female who is on palliative care for multiple medical issues including HOCM, very mild CAD hypertension, mitral stenosis (s/p replacement with bioprosthesis), atrial fibrillation dyslipidemia. Limited stress echo showed no signif increase in LVOT gradient.chronic CHF  presents to the Emergency Department complaining of a fall that occurred earlier tonight around 7:30 PM. She states she was trying to get her breathing machine, got caught up and fell backwards onto her buttocks. Pt states she also hit the back of her head on a metal door but denies LOC. She has sudden onset right buttock pain and lower back pain. Certain movements worsen the pain. Pt is also complaining of mild chest tenderness but states she always has it. Denies visual disturbance, neck pain, abdominal pain. Pt is currently on coumadin.   Past Medical History  Diagnosis Date  . CAD (coronary artery disease)   . Mitral valve disorder     a. s/p MVR;  b. 06/2013 Echo: EF 55-60%, no rwma, Ao sclerosis, mild AI, mild MR, miod dil LA, sev dil RV with reduced fxn, sev dil RA, mod TR/PR, PASP (study reviewed by Dr. Verlin Fester- MS felt to be sev, peak grad 39/mean 13).  [name] HTN (hypertension)   . Dyslipidemia   . Aortic stenosis, mild   . Breast cyst   . Hypertrophic cardiomyopathy   . Sleep apnea   . COPD (chronic obstructive pulmonary disease)     "a touch of"; prn o2 at home with activity   .  Atrial fibrillation     a. chronic coumadin.  [name] Hypothyroidism   . Anemia   . Chronic right-sided heart failure    Past Surgical History  Procedure Laterality Date  . Laparoscopic cholecystectomy  2009  . Cataract extraction  2003    bilateral  . Mitral valve replacement  2007  . Neck mass excision  2003    benign  . Colonoscopy    . Broken wrist      left, metal implanted   . Broken ankle      right, metal implanted   . Cardiac catheterization  06/2011  . Breast surgery      bilateral cysts x3 removed, benign   . Tee without cardioversion  01/31/2012    Procedure: TRANSESOPHAGEAL ECHOCARDIOGRAM (TEE);  Surgeon: Pricilla Riffle, MD;  Location: [facility] ENDOSCOPY;  Service: Cardiovascular;  Laterality: N/A;  . Cardioversion N/A 01/16/2013    Procedure: CARDIOVERSION;  Surgeon: Vesta Mixer, MD;  Location: [facility] ENDOSCOPY;  Service: Cardiovascular;  Laterality: N/A;   Family History  Problem Relation Age of Onset  . Diabetes Mother   . CVA Mother   . Other Father   . Diabetes Brother   . Cancer Brother     throat ca, smoker  . Aneurysm Brother     brain aneurysm  . Breast cancer Sister   . Emphysema Sister     smoker  History  Substance Use Topics  . Smoking status: Former Smoker -- 1.00 packs/day for 40 years    Types: Cigarettes    Quit date: 05/21/1990  . Smokeless tobacco: Never Used  . Alcohol Use: No   OB History   Grav Para Term Preterm Abortions TAB SAB Ect Mult Living                 Review of Systems  Eyes: Negative for visual disturbance.  Gastrointestinal: Negative for abdominal pain.  Musculoskeletal: Positive for back pain and myalgias. Negative for neck pain.  All other systems reviewed and are negative.   Allergies  Ivp dye; Nitrofurantoin; and Ramipril  Home Medications   Current Outpatient Rx  Name  Route  Sig  Dispense  Refill  . amiodarone (PACERONE) 200 MG tablet   Oral   Take 100 mg by mouth daily.         . calcitonin, salmon,  (MIACALCIN/FORTICAL) 200 UNIT/ACT nasal spray   Alternating Nares   Place 1 spray into alternate nostrils daily.          . Calcium Carbonate-Vitamin D (CALCIUM 500 + D PO)   Oral   Take 1 tablet by mouth daily.         . cholecalciferol (VITAMIN D) 1000 UNITS tablet   Oral   Take 1,000 Units by mouth at bedtime.          . Coenzyme Q10 (COQ10 PO)   Oral   Take 300 mg by mouth daily.         . Flaxseed, Linseed, (TH FLAX SEED OIL PO)   Oral   Take 1 capsule by mouth daily.         . furosemide (LASIX) 20 MG tablet   Oral   Take 20 mg by mouth 3 (three) times a week. Monday, Wednesday and friday         . levothyroxine (SYNTHROID, LEVOTHROID) 125 MCG tablet   Oral   Take 62.5 mcg by mouth daily before breakfast.         . lidocaine (XYLOCAINE) 2 % jelly   Topical   Apply 1 application topically daily as needed (pain).         [name]. losartan (COZAAR) 100 MG tablet   Oral   Take 1 tablet (100 mg total) by mouth daily.   30 tablet   6   . nitroGLYCERIN (NITROSTAT) 0.4 MG SL tablet   Sublingual   Place 0.4 mg under the tongue every 5 (five) minutes as needed for chest pain.         [name]. OVER THE COUNTER MEDICATION   Topical   Apply 1 application topically daily as needed (pain). Shingles rescue         . Probiotic Product (PROBIOTIC DAILY PO)   Oral   Take 1 tablet by mouth daily.         . sertraline (ZOLOFT) 100 MG tablet   Oral   Take 100 mg by mouth daily.         . traMADol (ULTRAM) 50 MG tablet   Oral   Take 50 mg by mouth 3 (three) times daily as needed for moderate pain.          [name]. warfarin (COUMADIN) 1 MG tablet   Oral   Take 1 mg by mouth 2 (two) times a week. Take along with the 2.5mg  only on Tuesday and Thursday         .  warfarin (COUMADIN) 5 MG tablet   Oral   Take 2.5 mg by mouth daily. On Tuesday and Thursday take along with the 1mg  tablet         . valACYclovir (VALTREX) 1000 MG tablet   Oral   Take 1 tablet (1,000  mg total) by mouth 3 (three) times daily.   21 tablet   0    BP 143/64  Pulse 64  Temp(Src) 98.7 F (37.1 C) (Oral)  Resp 22  Ht 5\' 4"  (1.626 m)  Wt 156 lb (70.761 kg)  BMI 26.76 kg/m2  SpO2 98%  Physical Exam  Nursing note and vitals reviewed. Constitutional: She is oriented to person, place, and time. She appears well-developed and well-nourished. No distress.  HENT:  Head: Normocephalic and atraumatic.  Eyes: EOM are normal.  Neck: Neck supple. No tracheal deviation present.  Cardiovascular: Normal rate.   Pulmonary/Chest: Effort normal. No respiratory distress. She has wheezes.  Tenderness to palpation of her chest, pt reports it is chronic  Abdominal: Soft. She exhibits no distension. There is no tenderness. There is no rebound.  Musculoskeletal: Normal range of motion.       Lumbar back: She exhibits tenderness (to thoracic spine and left hip), bony tenderness and pain. She exhibits no swelling, no deformity and normal pulse.       Back:  Bilateral femoral pulses palpable.  Neurological: She is alert and oriented to person, place, and time.  Skin: Skin is warm and dry.  Psychiatric: She has a normal mood and affect. Her behavior is normal.    ED Course  Procedures (including critical care time)  DIAGNOSTIC STUDIES: Oxygen Saturation is 98% on [place], normal by my interpretation.    COORDINATION OF CARE: 9:04 PM-Discussed treatment plan which includes pain management, xrays and head CT with pt at bedside and pt agreed to plan.   Radiologist called and informed me that the patient has what appears to be a thoracic dissection. This scan is without contrast. I discussed this with doctor Pollina who wants CT scans of her chest, abdomen and pelvis to evaluate. The patient has a contrast dye allergy, radiology was consulted and since her allergy is Benadryl they recommend 50 mg IV Benadryl for pretreatment.  11:52 pm- patients hip xrays negative. Currently waiting for scans  of her chest, abdomen, pelvis. Dr. Blinda Leatherwood is still monitoring patient with me.   12:52 pm- The radiologist called and spoke with Dr. Blinda Leatherwood and myself. She has an incidental findings of a complex aortic aneurysm/dissection. She also has a T12 fracture which is causing her back pain. Dr. Blinda Leatherwood.   Labs Review Labs Reviewed  BASIC METABOLIC PANEL - Abnormal; Notable for the following:    Glucose, Bld 117 (*)    BUN 25 (*)    GFR calc non Af Amer 64 (*)    GFR calc Af Amer 74 (*)    All other components within normal limits  CBC WITH DIFFERENTIAL - Abnormal; Notable for the following:    WBC 11.8 (*)    Hemoglobin 11.0 (*)    HCT 35.6 (*)    MCH 24.2 (*)    RDW 20.0 (*)    Neutrophils Relative % 88 (*)    Neutro Abs 10.3 (*)    Lymphocytes Relative 5 (*)    Lymphs Abs 0.6 (*)    All other components within normal limits  PROTIME-INR - Abnormal; Notable for the following:    Prothrombin Time 22.5 (*)  INR 2.05 (*)    All other components within normal limits  TYPE AND SCREEN   Imaging Review Dg Hip Complete Left  08/20/2013   CLINICAL DATA:  Status post fall; left hip pain.  EXAM: LEFT HIP - COMPLETE 2+ VIEW  COMPARISON:  None.  FINDINGS: There is no evidence of fracture or dislocation. Both femoral heads are seated normally within their respective acetabula. The proximal left femur appears intact. No significant degenerative change is appreciated. The sacroiliac joints are unremarkable in appearance.  The visualized bowel gas pattern is grossly unremarkable in appearance.  IMPRESSION: No evidence of fracture or dislocation.   Electronically Signed   By: Roanna Raider M.D.   On: 08/20/2013 22:11   Ct Head Wo Contrast  08/20/2013   CLINICAL DATA:  Status post fall; hit back of head. Concern for cervical spine injury.  EXAM: CT HEAD WITHOUT CONTRAST  CT CERVICAL SPINE WITHOUT CONTRAST  TECHNIQUE: Multidetector CT imaging of the head and cervical spine was performed following the  standard protocol without intravenous contrast. Multiplanar CT image reconstructions of the cervical spine were also generated.  COMPARISON:  CT of the head performed 02/15/2012, and CT of the cervical spine performed 08/25/2011  FINDINGS: CT HEAD FINDINGS  There is no evidence of acute infarction, mass lesion, or intra- or extra-axial hemorrhage on CT.  Prominence of the sulci reflects moderate cortical volume loss. Scattered periventricular and subcortical white matter change likely reflects small vessel ischemic microangiopathy. Cerebellar atrophy is noted, with multiple small chronic infarcts at the right cerebellar hemisphere.  The brainstem and fourth ventricle are within normal limits. The basal ganglia are unremarkable in appearance. The cerebral hemispheres demonstrate grossly normal gray-white differentiation. No mass effect or midline shift is seen.  There is no evidence of fracture; visualized osseous structures are unremarkable in appearance. The orbits are within normal limits. The paranasal sinuses and mastoid air cells are well-aerated. No significant soft tissue abnormalities are seen.  CT CERVICAL SPINE FINDINGS  There is no evidence of fracture or subluxation. Vertebral bodies demonstrate normal height and alignment. Intervertebral disc spaces are preserved. Prevertebral soft tissues are within normal limits. The visualized neural foramina are grossly unremarkable.  The thyroid gland is unremarkable in appearance. Mild atelectasis is noted at the lung apices. Calcification is noted at the carotid bifurcations bilaterally.  There appears to be new dilatation of the descending thoracic aorta to somewhere between 4.8 cm and 6.1 cm in maximal diameter, with blood seen tracking outside of the calcified lumen of the descending thoracic aorta. This appears to be new from 2013, and may reflect interval dissection or worsening aneurysmal dilatation. There is corresponding dilatation of the aortic arch to  4.2 cm in maximal diameter. Trace left-sided pleural fluid is seen.  IMPRESSION: 1. No evidence of traumatic intracranial injury or fracture. 2. No evidence of fracture or subluxation along the cervical spine. 3. Moderate cortical volume loss and scattered small vessel ischemic microangiopathy. 4. Multiple small chronic infarcts noted at the right cerebellar hemisphere. 5. New dilatation of the descending thoracic aorta to somewhere between 4.8 cm and 6.1 cm in maximal diameter, with blood seen tracking outside of the calcified lumen of the descending thoracic aorta. This appears new from 2013, and may reflect interval acute or chronic dissection, or worsening aneurysmal dilatation. Corresponding dilatation of the aortic arch to 4.2 cm in maximal diameter; trace left pleural fluid seen, of uncertain significance. CTA of the chest is recommended for further evaluation, when and  as deemed clinically appropriate. 6. Calcification noted at the carotid bifurcations bilaterally.  These results were called by telephone at the time of interpretation on 08/20/2013 at 9:56 PM to [facility] PA, who verbally acknowledged these results.   Electronically Signed   By: Roanna Raider M.D.   On: 08/20/2013 22:03   Ct Cervical Spine Wo Contrast  08/20/2013   CLINICAL DATA:  Status post fall; hit back of head. Concern for cervical spine injury.  EXAM: CT HEAD WITHOUT CONTRAST  CT CERVICAL SPINE WITHOUT CONTRAST  TECHNIQUE: Multidetector CT imaging of the head and cervical spine was performed following the standard protocol without intravenous contrast. Multiplanar CT image reconstructions of the cervical spine were also generated.  COMPARISON:  CT of the head performed 02/15/2012, and CT of the cervical spine performed 08/25/2011  FINDINGS: CT HEAD FINDINGS  There is no evidence of acute infarction, mass lesion, or intra- or extra-axial hemorrhage on CT.  Prominence of the sulci reflects moderate cortical volume loss. Scattered  periventricular and subcortical white matter change likely reflects small vessel ischemic microangiopathy. Cerebellar atrophy is noted, with multiple small chronic infarcts at the right cerebellar hemisphere.  The brainstem and fourth ventricle are within normal limits. The basal ganglia are unremarkable in appearance. The cerebral hemispheres demonstrate grossly normal gray-white differentiation. No mass effect or midline shift is seen.  There is no evidence of fracture; visualized osseous structures are unremarkable in appearance. The orbits are within normal limits. The paranasal sinuses and mastoid air cells are well-aerated. No significant soft tissue abnormalities are seen.  CT CERVICAL SPINE FINDINGS  There is no evidence of fracture or subluxation. Vertebral bodies demonstrate normal height and alignment. Intervertebral disc spaces are preserved. Prevertebral soft tissues are within normal limits. The visualized neural foramina are grossly unremarkable.  The thyroid gland is unremarkable in appearance. Mild atelectasis is noted at the lung apices. Calcification is noted at the carotid bifurcations bilaterally.  There appears to be new dilatation of the descending thoracic aorta to somewhere between 4.8 cm and 6.1 cm in maximal diameter, with blood seen tracking outside of the calcified lumen of the descending thoracic aorta. This appears to be new from 2013, and may reflect interval dissection or worsening aneurysmal dilatation. There is corresponding dilatation of the aortic arch to 4.2 cm in maximal diameter. Trace left-sided pleural fluid is seen.  IMPRESSION: 1. No evidence of traumatic intracranial injury or fracture. 2. No evidence of fracture or subluxation along the cervical spine. 3. Moderate cortical volume loss and scattered small vessel ischemic microangiopathy. 4. Multiple small chronic infarcts noted at the right cerebellar hemisphere. 5. New dilatation of the descending thoracic aorta to  somewhere between 4.8 cm and 6.1 cm in maximal diameter, with blood seen tracking outside of the calcified lumen of the descending thoracic aorta. This appears new from 2013, and may reflect interval acute or chronic dissection, or worsening aneurysmal dilatation. Corresponding dilatation of the aortic arch to 4.2 cm in maximal diameter; trace left pleural fluid seen, of uncertain significance. CTA of the chest is recommended for further evaluation, when and as deemed clinically appropriate. 6. Calcification noted at the carotid bifurcations bilaterally.  These results were called by telephone at the time of interpretation on 08/20/2013 at 9:56 PM to [facility] PA, who verbally acknowledged these results.   Electronically Signed   By: Roanna Raider M.D.   On: 08/20/2013 22:03   Ct Angio Chest Aorta W/cm &/or Wo/cm  08/21/2013   ADDENDUM  REPORT: 08/21/2013 00:56  ADDENDUM: To be more clear, the focal disruption into the aortic media and associated intramural contrast pool occurs from the medial aspect of the true lumen. It is unlikely that this represents an acute injury related to the patient's fall from standing. This is likely chronic/subacute having occurred at some point since the prior imaging in January of 2013.  Regardless, there has been a marked interval increase in the size of the transverse and proximal descending thoracic aorta and the combination of dissection and intramural contrast extravasation is high risk for rupture. Recommend consultation with cardiothoracic and/or vascular surgery prior to discharge for further evaluation as the patient may be a candidate for thoracic endovascular aortic repair.   Electronically Signed   By: Malachy Moan M.D.   On: 08/21/2013 00:56   08/21/2013   CLINICAL DATA:  Fall, aortic dissection a seen on CT scan of the cervical spine  EXAM: CT ANGIOGRAPHY CHEST, ABDOMEN AND PELVIS  TECHNIQUE: Multidetector CT imaging through the chest, abdomen and pelvis was  performed using the standard protocol during bolus administration of intravenous contrast. Multiplanar reconstructed images and MIPs were obtained and reviewed to evaluate the vascular anatomy.  CONTRAST:  OMNIPAQUE IOHEXOL 350 MG/ML SOLN  COMPARISON:  CT scan of the head and cervical spine obtained earlier today  Prior CT scan of the chest 06/07/2011  FINDINGS: CTA CHEST FINDINGS  Mediastinum: Unremarkable appearance of the thyroid gland. Stable mild mediastinal adenopathy. Contained aortic rupture with extravasated intravenous contrast in the periaortic space just lateral to the esophagus and posterior to tracheal air column. No significant extension of the mediastinal hematoma.  Heart/Vascular: Interval development of Stanford type B aortic dissection compared to 06/07/2011. The dissection flap arises from the proximal descending thoracic aorta and extends inferiorly to just above the aortic hiatus. The dissection does not extend into the abdominal aortic aneurysm. In addition to the main dissection flap, there is a focal breech in the media with a relatively large collection of intramural extravasation of contrast material. The pool of intramural contrast gives rise to a right intercostal bronchial trunk. The greatest diameter of the transverse segment of the aorta is 6.4 cm which is enlarged compared to 3.9 cm previously. Stable ectasia of the ascending thoracic aorta with a maximal diameter 4.5 cm. The descending thoracic aorta has slightly enlarged with the dissection measuring 3.8 cm at the level of the pulmonary veins compared to 3.5 cm previously. Unchanged aortic caliber at the hiatus. The aorta remains tortuous.  Mild cardiomegaly with left atrial enlargement. Markedly enlarged main and central pulmonary arteries consistent with pulmonary arterial hypertension. The main pulmonary artery measures 4.6 cm in diameter. Extensive atherosclerotic vascular calcifications in the coronary arteries. Mitral  valve replacement. Mild thickening and calcification of the aortic valve. No pericardial effusion.  Lungs/Pleura: Interlobular septal thickening and scattered areas of patchy ground-glass attenuation airspace opacity consistent with mild pulmonary edema. Dependent atelectasis noted bilaterally. Chronic bronchial wall thickening suggests chronic bronchitis/ COPD. There are a few emphysematous blebs.  Bones/Soft Tissues: No acute fracture or aggressive appearing lytic or blastic osseous lesion. Healed median sternotomy.  Review of the MIP images confirms the above findings.  CTA ABDOMEN AND PELVIS FINDINGS  VASCULAR  Aorta: Below the diaphragm, the aorta demonstrates no evidence of acute dissection. There is aneurysmal dilatation of the infrarenal abdominal aorta with a maximal transverse diameter of 2.7 cm. Scattered atherosclerotic vascular calcifications. No significant mural or wall adherent thrombus.  Celiac: Atherosclerotic plaque at  the origin. The vessel remains widely patent. Right hepatic artery is replaced to the SMA.  SMA: Widely patent.  Replaced right hepatic artery.  Renals: Single dominant renal arteries bilaterally. Mild to moderate stenosis of the proximal left renal artery. No changes of fibromuscular dysplasia.  IMA: Patent and slightly atretic.  Inflow: Atherosclerotic vascular calcifications without aneurysmal dilatation or significant stenosis.  Proximal Outflow: Mild atherosclerotic calcifications in the common femoral arteries. The visualized superficial and profunda femoral arteries are patent.  Veins: New focal venous abnormality identified given the limitations of non venous phase timing.  NON-VASCULAR  Abdomen: Unremarkable CT appearance of the stomach, duodenum, spleen, and pancreas save for fatty atrophy. Similar appearance of a mild adreniform thickening of the adrenal glands bilaterally likely reflecting adrenal hyperplasia. Normal hepatic contour and morphology. No discrete hepatic  lesion. Gallbladder is surgically absent. No intra or extrahepatic biliary ductal dilatation. 5.3 cm exophytic simple cyst from the upper pole of the right kidney. No hydronephrosis. No enhancing renal mass. 1.2 cm exophytic cyst from the interpolar left kidney.  Moderate volume of formed retained stool in the rectum consistent with constipation. Colonic diverticulosis without evidence of active diverticulitis. Normal appendix in the right lower quadrant. No free fluid or suspicious adenopathy.  Pelvis: Unremarkable appearance of the postmenopausal uterus and ovaries. Normal bladder. No free fluid or suspicious adenopathy.  Bones/Soft Tissues: Acute compression fracture of the superior endplate of T12. There is approximately 30% height loss. 3 mm of posterior bony retropulsion from the superior endplate. Chronic bilateral L4 pars defects with mild grade 1 anterolisthesis of L4 on L5.  Review of the MIP images confirms the above findings.  IMPRESSION: CTA CHEST  1. 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 The dissection is complex, in addition to the main true and false lumens there is a focal breech of the media medially with extravasation of an irregular pool of contrast material into the aortic wall. This occurs at the origin of a right intercostal bronchial trunk which opacifies with contrast material from the irregular intramural contrast collection. In this region, the aorta has markedly increased in size currently measuring 6.4 cm compared to 3.9 cm previously. The dissection flap does not extends into the abdominal aorta. 2. Cardiomegaly with left atrial enlargement. 3. Mitral valve replacement. 4. Atherosclerosis including coronary artery disease. 5. Marked enlargement of the main and central pulmonary arteries consistent with pulmonary arterial hypertension. 6. Pulmonary interstitial edema. 7. COPD. CTA ABD/PELVIS  1. No acute  abdominal aortic pathology. 2. Acute T12 compression fracture with approximately 30% height loss and 3 mm posterior bony retropulsion. 3. Mild fusiform aneurysmal dilatation of the infrarenal abdominal aorta with a maximal transverse diameter of 2.7 cm. 4. Colonic diverticulosis without evidence of active diverticulitis. 5. Chronic bilateral L4 pars defects with grade 1 anterolisthesis of L4 on L5. Critical Value/emergent results were called by telephone at the time of interpretation on 08/21/2013 at 12:45 AM to Dr. Blinda Leatherwood, who verbally acknowledged these results.  Electronically Signed: By: Malachy Moan M.D. On: 08/21/2013 00:48   Ct Angio Abd/pel W/ And/or W/o  08/21/2013   ADDENDUM REPORT: 08/21/2013 00:56  ADDENDUM: To be more clear, the focal disruption into the aortic media and associated intramural contrast pool occurs from the medial aspect of the true lumen. It is unlikely that this represents an acute injury related to the patient's fall from standing. This is likely chronic/subacute having occurred at some point  since the prior imaging in January of 2013.  Regardless, there has been a marked interval increase in the size of the transverse and proximal descending thoracic aorta and the combination of dissection and intramural contrast extravasation is high risk for rupture. Recommend consultation with cardiothoracic and/or vascular surgery prior to discharge for further evaluation as the patient may be a candidate for thoracic endovascular aortic repair.   Electronically Signed   By: Malachy Moan M.D.   On: 08/21/2013 00:56   08/21/2013   CLINICAL DATA:  Fall, aortic dissection a seen on CT scan of the cervical spine  EXAM: CT ANGIOGRAPHY CHEST, ABDOMEN AND PELVIS  TECHNIQUE: Multidetector CT imaging through the chest, abdomen and pelvis was performed using the standard protocol during bolus administration of intravenous contrast. Multiplanar reconstructed images and MIPs were obtained and  reviewed to evaluate the vascular anatomy.  CONTRAST:  OMNIPAQUE IOHEXOL 350 MG/ML SOLN  COMPARISON:  CT scan of the head and cervical spine obtained earlier today  Prior CT scan of the chest 06/07/2011  FINDINGS: CTA CHEST FINDINGS  Mediastinum: Unremarkable appearance of the thyroid gland. Stable mild mediastinal adenopathy. Contained aortic rupture with extravasated intravenous contrast in the periaortic space just lateral to the esophagus and posterior to tracheal air column. No significant extension of the mediastinal hematoma.  Heart/Vascular: Interval development of Stanford type B aortic dissection compared to 06/07/2011. The dissection flap arises from the proximal descending thoracic aorta and extends inferiorly to just above the aortic hiatus. The dissection does not extend into the abdominal aortic aneurysm. In addition to the main dissection flap, there is a focal breech in the media with a relatively large collection of intramural extravasation of contrast material. The pool of intramural contrast gives rise to a right intercostal bronchial trunk. The greatest diameter of the transverse segment of the aorta is 6.4 cm which is enlarged compared to 3.9 cm previously. Stable ectasia of the ascending thoracic aorta with a maximal diameter 4.5 cm. The descending thoracic aorta has slightly enlarged with the dissection measuring 3.8 cm at the level of the pulmonary veins compared to 3.5 cm previously. Unchanged aortic caliber at the hiatus. The aorta remains tortuous.  Mild cardiomegaly with left atrial enlargement. Markedly enlarged main and central pulmonary arteries consistent with pulmonary arterial hypertension. The main pulmonary artery measures 4.6 cm in diameter. Extensive atherosclerotic vascular calcifications in the coronary arteries. Mitral valve replacement. Mild thickening and calcification of the aortic valve. No pericardial effusion.  Lungs/Pleura: Interlobular septal thickening and  scattered areas of patchy ground-glass attenuation airspace opacity consistent with mild pulmonary edema. Dependent atelectasis noted bilaterally. Chronic bronchial wall thickening suggests chronic bronchitis/ COPD. There are a few emphysematous blebs.  Bones/Soft Tissues: No acute fracture or aggressive appearing lytic or blastic osseous lesion. Healed median sternotomy.  Review of the MIP images confirms the above findings.  CTA ABDOMEN AND PELVIS FINDINGS  VASCULAR  Aorta: Below the diaphragm, the aorta demonstrates no evidence of acute dissection. There is aneurysmal dilatation of the infrarenal abdominal aorta with a maximal transverse diameter of 2.7 cm. Scattered atherosclerotic vascular calcifications. No significant mural or wall adherent thrombus.  Celiac: Atherosclerotic plaque at the origin. The vessel remains widely patent. Right hepatic artery is replaced to the SMA.  SMA: Widely patent.  Replaced right hepatic artery.  Renals: Single dominant renal arteries bilaterally. Mild to moderate stenosis of the proximal left renal artery. No changes of fibromuscular dysplasia.  IMA: Patent and slightly atretic.  Inflow: Atherosclerotic  vascular calcifications without aneurysmal dilatation or significant stenosis.  Proximal Outflow: Mild atherosclerotic calcifications in the common femoral arteries. The visualized superficial and profunda femoral arteries are patent.  Veins: New focal venous abnormality identified given the limitations of non venous phase timing.  NON-VASCULAR  Abdomen: Unremarkable CT appearance of the stomach, duodenum, spleen, and pancreas save for fatty atrophy. Similar appearance of a mild adreniform thickening of the adrenal glands bilaterally likely reflecting adrenal hyperplasia. Normal hepatic contour and morphology. No discrete hepatic lesion. Gallbladder is surgically absent. No intra or extrahepatic biliary ductal dilatation. 5.3 cm exophytic simple cyst from the upper pole of the  right kidney. No hydronephrosis. No enhancing renal mass. 1.2 cm exophytic cyst from the interpolar left kidney.  Moderate volume of formed retained stool in the rectum consistent with constipation. Colonic diverticulosis without evidence of active diverticulitis. Normal appendix in the right lower quadrant. No free fluid or suspicious adenopathy.  Pelvis: Unremarkable appearance of the postmenopausal uterus and ovaries. Normal bladder. No free fluid or suspicious adenopathy.  Bones/Soft Tissues: Acute compression fracture of the superior endplate of T12. There is approximately 30% height loss. 3 mm of posterior bony retropulsion from the superior endplate. Chronic bilateral L4 pars defects with mild grade 1 anterolisthesis of L4 on L5.  Review of the MIP images confirms the above findings.  IMPRESSION: CTA CHEST  1. 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 The dissection is complex, in addition to the main true and false lumens there is a focal breech of the media medially with extravasation of an irregular pool of contrast material into the aortic wall. This occurs at the origin of a right intercostal bronchial trunk which opacifies with contrast material from the irregular intramural contrast collection. In this region, the aorta has markedly increased in size currently measuring 6.4 cm compared to 3.9 cm previously. The dissection flap does not extends into the abdominal aorta. 2. Cardiomegaly with left atrial enlargement. 3. Mitral valve replacement. 4. Atherosclerosis including coronary artery disease. 5. Marked enlargement of the main and central pulmonary arteries consistent with pulmonary arterial hypertension. 6. Pulmonary interstitial edema. 7. COPD. CTA ABD/PELVIS  1. No acute abdominal aortic pathology. 2. Acute T12 compression fracture with approximately 30% height loss and 3 mm posterior bony retropulsion. 3. Mild fusiform  aneurysmal dilatation of the infrarenal abdominal aorta with a maximal transverse diameter of 2.7 cm. 4. Colonic diverticulosis without evidence of active diverticulitis. 5. Chronic bilateral L4 pars defects with grade 1 anterolisthesis of L4 on L5. Critical Value/emergent results were called by telephone at the time of interpretation on 08/21/2013 at 12:45 AM to Dr. Blinda Leatherwood, who verbally acknowledged these results.  Electronically Signed: By: Malachy Moan M.D. On: 08/21/2013 00:48     EKG Interpretation None      MDM   Final diagnoses:  T12 compression fracture  Dissection of descending thoracic aorta    CRITICAL CARE Performed by: Dorthula Matas Total critical care time: 35 Critical care time was exclusive of separately billable procedures and treating other patients. Critical care was necessary to treat or prevent imminent or life-threatening deterioration. Critical care was time spent personally by me on the following activities: development of treatment plan with patient and/or surrogate as well as nursing, discussions with consultants, evaluation of patient's response to treatment, examination of patient, obtaining history from patient or surrogate, ordering and performing treatments and interventions, ordering and review of laboratory studies, ordering and review  of radiographic studies, pulse oximetry and re-evaluation of patient's condition.  I spoke with Dr. Myra Gianotti- Vascular. He reviewed the images and then called me back. He says that this is not going to require emergent intervention. He is not entirely sure that he would do more than medical management unless she became unstable or developed symptoms. Unsure if this is acute or chronic. He says that he will see her first thing in the morning but does not believe intervention will be required.  I discussed case with [facility], pt is DNR/DNI, vascular will see first thing in the morning. Will place on inpatient  telemetry. Discussed with the patients family and they understand what the plan is for this patient.   Inpatient, team 10, tele, [facility], Dr. Allena Katz  I personally performed the services described in this documentation, which was scribed in my presence. The recorded information has been reviewed and is accurate.  Dorthula Matas, PA-C 08/21/13 [id number]

## 2013-08-21 DIAGNOSIS — I4891 Unspecified atrial fibrillation: Secondary | ICD-10-CM

## 2013-08-21 DIAGNOSIS — I712 Thoracic aortic aneurysm, without rupture, unspecified: Secondary | ICD-10-CM

## 2013-08-21 DIAGNOSIS — I7103 Dissection of thoracoabdominal aorta: Secondary | ICD-10-CM

## 2013-08-21 DIAGNOSIS — I7101 Dissection of thoracic aorta: Secondary | ICD-10-CM | POA: Diagnosis not present

## 2013-08-21 DIAGNOSIS — I71019 Dissection of thoracic aorta, unspecified: Secondary | ICD-10-CM | POA: Diagnosis not present

## 2013-08-21 DIAGNOSIS — I71012 Dissection of descending thoracic aorta: Secondary | ICD-10-CM | POA: Diagnosis present

## 2013-08-21 DIAGNOSIS — S22009A Unspecified fracture of unspecified thoracic vertebra, initial encounter for closed fracture: Secondary | ICD-10-CM | POA: Diagnosis not present

## 2013-08-21 DIAGNOSIS — I421 Obstructive hypertrophic cardiomyopathy: Secondary | ICD-10-CM

## 2013-08-21 DIAGNOSIS — Z01810 Encounter for preprocedural cardiovascular examination: Secondary | ICD-10-CM

## 2013-08-21 LAB — CBC
HCT: 32.3 % — ABNORMAL LOW (ref 36.0–46.0)
Hemoglobin: 10 g/dL — ABNORMAL LOW (ref 12.0–15.0)
MCH: 24.3 pg — ABNORMAL LOW (ref 26.0–34.0)
MCHC: 31 g/dL (ref 30.0–36.0)
MCV: 78.4 fL (ref 78.0–100.0)
PLATELETS: 147 10*3/uL — AB (ref 150–400)
RBC: 4.12 MIL/uL (ref 3.87–5.11)
RDW: 19.9 % — AB (ref 11.5–15.5)
WBC: 9.8 10*3/uL (ref 4.0–10.5)

## 2013-08-21 LAB — URINALYSIS, ROUTINE W REFLEX MICROSCOPIC
Bilirubin Urine: NEGATIVE
GLUCOSE, UA: NEGATIVE mg/dL
Hgb urine dipstick: NEGATIVE
KETONES UR: NEGATIVE mg/dL
LEUKOCYTES UA: NEGATIVE
Nitrite: NEGATIVE
Protein, ur: NEGATIVE mg/dL
Specific Gravity, Urine: 1.015 (ref 1.005–1.030)
Urobilinogen, UA: 1 mg/dL (ref 0.0–1.0)
pH: 5 (ref 5.0–8.0)

## 2013-08-21 LAB — BASIC METABOLIC PANEL
BUN: 20 mg/dL (ref 6–23)
CHLORIDE: 101 meq/L (ref 96–112)
CO2: 26 mEq/L (ref 19–32)
CREATININE: 0.72 mg/dL (ref 0.50–1.10)
Calcium: 8.8 mg/dL (ref 8.4–10.5)
GFR calc non Af Amer: 77 mL/min — ABNORMAL LOW (ref >90)
GFR, EST AFRICAN AMERICAN: 89 mL/min — AB (ref >90)
Glucose, Bld: 103 mg/dL — ABNORMAL HIGH (ref 70–99)
Potassium: 4.4 mEq/L (ref 3.7–5.3)
SODIUM: 138 meq/L (ref 137–147)

## 2013-08-21 LAB — MRSA PCR SCREENING: MRSA by PCR: NEGATIVE

## 2013-08-21 MED ORDER — SACCHAROMYCES BOULARDII 250 MG PO CAPS
250.0000 mg | ORAL_CAPSULE | Freq: Every day | ORAL | Status: DC
  Administered 2013-08-21 – 2013-08-25 (×5): 250 mg via ORAL
  Filled 2013-08-21 (×5): qty 1

## 2013-08-21 MED ORDER — PROBIOTIC DAILY PO CAPS: 1.0000 | ORAL_CAPSULE | Freq: Every day | ORAL | Status: DC

## 2013-08-21 MED ORDER — SERTRALINE HCL 100 MG PO TABS
100.0000 mg | ORAL_TABLET | Freq: Every day | ORAL | Status: DC
  Administered 2013-08-21 – 2013-08-25 (×5): 100 mg via ORAL
  Filled 2013-08-21 (×5): qty 1

## 2013-08-21 MED ORDER — LEVOTHYROXINE SODIUM 125 MCG PO TABS
62.5000 ug | ORAL_TABLET | Freq: Every day | ORAL | Status: DC
  Administered 2013-08-21 – 2013-08-24 (×4): 62.5 ug via ORAL
  Administered 2013-08-25: 0.5 ug via ORAL
  Filled 2013-08-21 (×9): qty 0.5

## 2013-08-21 MED ORDER — AMIODARONE HCL 100 MG PO TABS
100.0000 mg | ORAL_TABLET | Freq: Every day | ORAL | Status: DC
  Administered 2013-08-21 – 2013-08-25 (×5): 100 mg via ORAL
  Filled 2013-08-21 (×5): qty 1

## 2013-08-21 MED ORDER — OXYCODONE-ACETAMINOPHEN 5-325 MG PO TABS
1.0000 | ORAL_TABLET | Freq: Four times a day (QID) | ORAL | Status: DC | PRN
  Administered 2013-08-21 – 2013-08-25 (×5): 1 via ORAL
  Filled 2013-08-21 (×5): qty 1

## 2013-08-21 MED ORDER — LOSARTAN POTASSIUM 50 MG PO TABS
50.0000 mg | ORAL_TABLET | Freq: Every day | ORAL | Status: DC
  Administered 2013-08-21 – 2013-08-25 (×5): 50 mg via ORAL
  Filled 2013-08-21 (×5): qty 1

## 2013-08-21 MED ORDER — SODIUM CHLORIDE 0.9 % IV SOLN
INTRAVENOUS | Status: DC
  Administered 2013-08-21 (×2): via INTRAVENOUS
  Administered 2013-08-22 (×2): 10 mL/h via INTRAVENOUS

## 2013-08-21 MED ORDER — SODIUM CHLORIDE 0.9 % IV SOLN: 250.0000 mL | INTRAVENOUS | Status: DC | PRN

## 2013-08-21 NOTE — Consult Note (Signed)
Consult Note  Patient name: Patricia [name] MRN: [id number] DOB: 1927-01-12 Sex: female  Consulting Physician:  ER  Reason for Consult:  Chief Complaint  Patient presents with  . Fall    HISTORY OF PRESENT ILLNESS: This is an 80 yo female with multiple comorbidities including severe MS with hypertrophic cardiomyopathy and heart failure. She was admitted for her 3rd fall recently with T12 compression fracture. She is on coumadin for AFIB. An incentendal finding on CT scan was a type III dissection. This was not seen on her most recent CT scan in 2013. She reports an episode of severe chest pain about one month ago which resolved and was thought to be secondary to shingles for which she takes valtrex. She denies current chest of abdominal pain. She does not have renal insufficiency.  She is a DNR/DNI.  She has COPD and is on pm O2.   Past Medical History  Diagnosis Date  . CAD (coronary artery disease)   . Mitral valve disorder     a. s/p MVR;  b. 06/2013 Echo: EF 55-60%, no rwma, Ao sclerosis, mild AI, mild MR, miod dil LA, sev dil RV with reduced fxn, sev dil RA, mod TR/PR, PASP (study reviewed by Dr. Verlin Fester- MS felt to be sev, peak grad 39/mean 13).  [name] HTN (hypertension)   . Dyslipidemia   . Aortic stenosis, mild   . Breast cyst   . Hypertrophic cardiomyopathy   . Sleep apnea   . COPD (chronic obstructive pulmonary disease)     "a touch of"; prn o2 at home with activity   . Atrial fibrillation     a. chronic coumadin.  [name] Hypothyroidism   . Anemia   . Chronic right-sided heart failure     Past Surgical History  Procedure Laterality Date  . Laparoscopic cholecystectomy  2009  . Cataract extraction  2003    bilateral  . Mitral valve replacement  2007  . Neck mass excision  2003    benign  . Colonoscopy    . Broken wrist      left, metal implanted   . Broken ankle      right, metal implanted   . Cardiac catheterization  06/2011  . Breast surgery      bilateral  cysts x3 removed, benign   . Tee without cardioversion  01/31/2012    Procedure: TRANSESOPHAGEAL ECHOCARDIOGRAM (TEE);  Surgeon: Pricilla Riffle, MD;  Location: [facility] ENDOSCOPY;  Service: Cardiovascular;  Laterality: N/A;  . Cardioversion N/A 01/16/2013    Procedure: CARDIOVERSION;  Surgeon: Vesta Mixer, MD;  Location: [facility] ENDOSCOPY;  Service: Cardiovascular;  Laterality: N/A;    History   Social History  . Marital Status: Married    Spouse Name: N/A    Number of Children: N/A  . Years of Education: N/A   Occupational History  . Retired    Social History Main Topics  . Smoking status: Former Smoker -- 1.00 packs/day for 40 years    Types: Cigarettes    Quit date: 05/21/1990  . Smokeless tobacco: Never Used  . Alcohol Use: No  . Drug Use: No  . Sexual Activity: No   Other Topics Concern  . Not on file   Social History Narrative   Lives with her husband.  Drives and walks around without assist device.            Family History  Problem Relation Age of Onset  .  Diabetes Mother   . CVA Mother   . Other Father   . Diabetes Brother   . Cancer Brother     throat ca, smoker  . Aneurysm Brother     brain aneurysm  . Breast cancer Sister   . Emphysema Sister     smoker    Allergies as of 08/20/2013 - Review Complete 08/20/2013  Allergen Reaction Noted  . Ivp dye [iodinated diagnostic agents] Itching 06/22/2011  . Nitrofurantoin Itching 01/06/2009  . Ramipril  03/24/2013    No current facility-administered medications on file prior to encounter.   Current Outpatient Prescriptions on File Prior to Encounter  Medication Sig Dispense Refill  . amiodarone (PACERONE) 200 MG tablet Take 100 mg by mouth daily.      . calcitonin, salmon, (MIACALCIN/FORTICAL) 200 UNIT/ACT nasal spray Place 1 spray into alternate nostrils daily.       . cholecalciferol (VITAMIN D) 1000 UNITS tablet Take 1,000 Units by mouth at bedtime.       . Coenzyme Q10 (COQ10 PO) Take 300 mg by mouth daily.       . Flaxseed, Linseed, (TH FLAX SEED OIL PO) Take 1 capsule by mouth daily.      [name]. levothyroxine (SYNTHROID, LEVOTHROID) 125 MCG tablet Take 62.5 mcg by mouth daily before breakfast.      . losartan (COZAAR) 100 MG tablet Take 1 tablet (100 mg total) by mouth daily.  30 tablet  6  . nitroGLYCERIN (NITROSTAT) 0.4 MG SL tablet Place 0.4 mg under the tongue every 5 (five) minutes as needed for chest pain.      . Probiotic Product (PROBIOTIC DAILY PO) Take 1 tablet by mouth daily.      . sertraline (ZOLOFT) 100 MG tablet Take 100 mg by mouth daily.      . traMADol (ULTRAM) 50 MG tablet Take 50 mg by mouth 3 (three) times daily as needed for moderate pain.       [name]. warfarin (COUMADIN) 1 MG tablet Take 1 mg by mouth 2 (two) times a week. Take along with the 2.5mg  only on Tuesday and Thursday      . warfarin (COUMADIN) 5 MG tablet Take 2.5 mg by mouth daily. On Tuesday and Thursday take along with the 1mg  tablet      . valACYclovir (VALTREX) 1000 MG tablet Take 1 tablet (1,000 mg total) by mouth 3 (three) times daily.  21 tablet  0     REVIEW OF SYSTEMS: See admission H&P.  She denies chest or abdominal pain  PHYSICAL EXAMINATION: General: The patient appears their stated age.  Vital signs are BP 168/72  Pulse 63  Temp(Src) 99.1 F (37.3 C) (Oral)  Resp 21  Ht 5\' 4"  (1.626 m)  Wt 156 lb 15.5 oz (71.2 kg)  BMI 26.93 kg/m2  SpO2 93% Pulmonary: Respirations are non-labored HEENT:  No gross abnormalities Abdomen: Soft and non-tender  Musculoskeletal: There are no major deformities.   Neurologic: No focal weakness or paresthesias are detected, Skin: There are no ulcer or rashes noted. Psychiatric: The patient has normal affect. Cardiovascular: palpable pedal pulses  Diagnostic Studies: 1. Compared to 06/07/2011 there is been interval development of an  aneurysmal Stanford type B thoracic aortic dissection. The  dissection is complex, in addition to the main true and false lumens  there  is a focal breech of the media medially with extravasation of  an irregular pool of contrast material into the aortic wall. This  occurs at the  origin of a right intercostal bronchial trunk which  opacifies with contrast material from the irregular intramural  contrast collection. In this region, the aorta has markedly  increased in size currently measuring 6.4 cm compared to 3.9 cm  previously. The dissection flap does not extends into the abdominal  aorta.    Assessment:  Type III aortic dissection of unknown duration Plan: I discussed with the patient and daughter the CT findings. It is unknown when she developed her type III dissection. It could potentially have been one month ago. Regardless, she is currently asymptomatic. The only indication for repair currently would be the size (6.2cm). She has no symptoms of malperfusion. Repair is complicated by her co-morbidities. I have asked cardiology to evaluate her risk for general anesthesia. She needs medical management currently which would consist of blood pressure control. I would not recommend endovascular repair during this admission as long as she remains asymptomatic. I would like her to follow up in clinic in 4 weeks with a repeat CTA of the chest to see if there has been any interval increase in size of the aneurysm.      Jorge Ny, M.D. [facility] Office: [phone] Pager:  [phone]

## 2013-08-21 NOTE — Progress Notes (Signed)
Patient trasfered from [id number] to [phone] via bed; alert and oriented x 4; no complaints of pain; IV in RFA - infusing; skin intact, dry with dry shingles rash on chest. Orient patient to room and unit; watch safety video; gave patient care guide; instructed how to use the call bell and  fall risk precautions. Will continue to monitor the patient.

## 2013-08-21 NOTE — Progress Notes (Signed)
[facility] PROGRESS NOTE  Patricia [name]  RUE:[id number]:[id number]  DOB: [birth year]  DOS: 08/21/2013 PCP: Londell MohPHARR,WALTER DAVIDSON, MD  Assessment and Plan: Type b descending thoracic dissection  HOCM Severe pulmonary hypertension Aortic stenosis Atrial fibrillation On Coumadin COPD After the sleep apnea on chronic oxygen Recent Shingles Coronary artery disease Mitral valve replacement The patient was initially discussed with me for admission. As per the ED physician The patient wanted to be remain DNR/DNI, but wanted to be treated for her type B descending thoracic aortic dissection as long as it does not require open heart surgery. On reviewing of her chart, she has multiple comorbidities limiting her care, especially options are limited to control her blood pressure due to biventricular heart failure, and HOCM. Patient was discussed with critical care service, who graciously accepted the patient to their care.  On discussion with the patient the patient is currently seeing palliative care services with Dr. Olegario MessierKathy since last one week, although there is no plan to go on hospice or comfort measures as per my discussion with the patient and she would like to be treated within the limits of DNR/DNI.  She currently remains chest pain-free blood pressure 135/65. She complains of pain in her lower back, does not have any radicular symptoms.   Highly Appreciate PCCM input.  Author: Lynden OxfordPranav Areona Homer, MD [facility] Pager: [phone] 08/21/2013 3:05 AM   If 7PM-7AM, please contact night-coverage at [email or web address], password [email or web address]

## 2013-08-21 NOTE — H&P (Signed)
PULMONARY / CRITICAL CARE MEDICINE   Name: Patricia [name] MRN: [id number] DOB: 12/05/1926    ADMISSION DATE:  08/20/2013  REFERRING MD :  EDP  PRIMARY SERVICE: PCCM   CHIEF COMPLAINT:  S/P Fall on anticoagulation   BRIEF PATIENT DESCRIPTION: 80 y/o F with PMH of AFib on coumadin who was admitted 4/3 after mechanical fall striking her head and back.  Fall work up found interval development of Type B aortic dissection.  PCCM called for admission.    SIGNIFICANT EVENTS: 4/3 Admit after mechanical fall, found to have interval development of Type B aortic dissection   STUDIES:  4/3 CTA Chest, Abd / Pelvis >> Interval development (since 05/2011) Type B aortic dissection, complex with focal disruption into the aortic media & associated intramural contrast pool occurs from the medial aspect of the true lumen.   Marked interval increase in the size of the transverse & proximal descending thoracic aorta.  No abdominal aortic pathology.  Acute T12 compression fx  LINES / TUBES:   CULTURES: UA 4/3>>> UC  4/3>>>  ANTIBIOTICS:   HISTORY OF PRESENT ILLNESS:   80 y/o F with PMH of OSA (non-compliant with CPAP), COPD on O2 2L (she wears "when she needs it" / non-compliant), PAH, hypothyroidism, Anemia, CAD, HTN, AS, s/p MVR, Hypertrophic Cardiomyopathy who presented 4/3 after mechanical fall striking her head and back.  She reports she was getting into the lift recliner and fell backward onto her buttock striking her back and head on the door as she fell in the seated position. Patient relays a second fall one week prior to admit while walking up the sidewalk with similar circumstances - "I was down before I knew it and could not stop myself".  Patient remembers the details of both falls and denies syncope or pre-syncopal symptoms.  She currently denies chest pain, headache, nausea, vomiting, diarrhea, dizziness, lightheadedness, shortness of breath.  She endorses low back without radicular symptoms /  hip pain.     ER work up included CTA of chest, abd / pelvis with an interval development (since 05/2011) Type B aortic dissection, complex with focal disruption into the aortic media & associated intramural contrast pool occurs from the medial aspect of the true lumen.   Marked interval increase in the size of the transverse & proximal descending thoracic aorta.  No abdominal aortic pathology.  Acute T12 compression fx.  PCCM called for admission.   She is followed by palliative care services at home and is DNR / DNI but otherwise full scope medical care.   PAST MEDICAL HISTORY :  Past Medical History  Diagnosis Date  . CAD (coronary artery disease)   . Mitral valve disorder     a. s/p MVR;  b. 06/2013 Echo: EF 55-60%, no rwma, Ao sclerosis, mild AI, mild MR, miod dil LA, sev dil RV with reduced fxn, sev dil RA, mod TR/PR, PASP (study reviewed by Dr. Verlin Fester- MS felt to be sev, peak grad 39/mean 13).  [name] HTN (hypertension)   . Dyslipidemia   . Aortic stenosis, mild   . Breast cyst   . Hypertrophic cardiomyopathy   . Sleep apnea   . COPD (chronic obstructive pulmonary disease)     "a touch of"; prn o2 at home with activity   . Atrial fibrillation     a. chronic coumadin.  [name] Hypothyroidism   . Anemia   . Chronic right-sided heart failure    Past Surgical History  Procedure Laterality Date  .  Laparoscopic cholecystectomy  2009  . Cataract extraction  2003    bilateral  . Mitral valve replacement  2007  . Neck mass excision  2003    benign  . Colonoscopy    . Broken wrist      left, metal implanted   . Broken ankle      right, metal implanted   . Cardiac catheterization  06/2011  . Breast surgery      bilateral cysts x3 removed, benign   . Tee without cardioversion  01/31/2012    Procedure: TRANSESOPHAGEAL ECHOCARDIOGRAM (TEE);  Surgeon: Pricilla Riffle, MD;  Location: [facility] ENDOSCOPY;  Service: Cardiovascular;  Laterality: N/A;  . Cardioversion N/A 01/16/2013    Procedure:  CARDIOVERSION;  Surgeon: Vesta Mixer, MD;  Location: [facility] ENDOSCOPY;  Service: Cardiovascular;  Laterality: N/A;   Prior to Admission medications   Medication Sig Start Date End Date Taking? Authorizing Provider  amiodarone (PACERONE) 200 MG tablet Take 100 mg by mouth daily. 11/28/12  Yes Pricilla Riffle, MD  calcitonin, salmon, (MIACALCIN/FORTICAL) 200 UNIT/ACT nasal spray Place 1 spray into alternate nostrils daily.  07/21/13  Yes Historical Provider, MD  Calcium Carbonate-Vitamin D (CALCIUM 500 + D PO) Take 1 tablet by mouth daily.   Yes Historical Provider, MD  cholecalciferol (VITAMIN D) 1000 UNITS tablet Take 1,000 Units by mouth at bedtime.    Yes Historical Provider, MD  Coenzyme Q10 (COQ10 PO) Take 300 mg by mouth daily.   Yes Historical Provider, MD  Flaxseed, Linseed, (TH FLAX SEED OIL PO) Take 1 capsule by mouth daily.   Yes Historical Provider, MD  furosemide (LASIX) 20 MG tablet Take 20 mg by mouth 3 (three) times a week. Monday, Wednesday and friday   Yes Historical Provider, MD  levothyroxine (SYNTHROID, LEVOTHROID) 125 MCG tablet Take 62.5 mcg by mouth daily before breakfast.   Yes Historical Provider, MD  lidocaine (XYLOCAINE) 2 % jelly Apply 1 application topically daily as needed (pain).   Yes Historical Provider, MD  losartan (COZAAR) 100 MG tablet Take 1 tablet (100 mg total) by mouth daily. 07/06/13  Yes Pricilla Riffle, MD  nitroGLYCERIN (NITROSTAT) 0.4 MG SL tablet Place 0.4 mg under the tongue every 5 (five) minutes as needed for chest pain.   Yes Historical Provider, MD  OVER THE COUNTER MEDICATION Apply 1 application topically daily as needed (pain). Shingles rescue   Yes Historical Provider, MD  Probiotic Product (PROBIOTIC DAILY PO) Take 1 tablet by mouth daily.   Yes Historical Provider, MD  sertraline (ZOLOFT) 100 MG tablet Take 100 mg by mouth daily.   Yes Historical Provider, MD  traMADol (ULTRAM) 50 MG tablet Take 50 mg by mouth 3 (three) times daily as needed for  moderate pain.  07/21/13  Yes Historical Provider, MD  warfarin (COUMADIN) 1 MG tablet Take 1 mg by mouth 2 (two) times a week. Take along with the 2.5mg  only on Tuesday and Thursday   Yes Historical Provider, MD  warfarin (COUMADIN) 5 MG tablet Take 2.5 mg by mouth daily. On Tuesday and Thursday take along with the 1mg  tablet   Yes Historical Provider, MD  valACYclovir (VALTREX) 1000 MG tablet Take 1 tablet (1,000 mg total) by mouth 3 (three) times daily. 08/08/13   Phillips Odor, MD   Allergies  Allergen Reactions  . Ivp Dye [Iodinated Diagnostic Agents] Itching  . Nitrofurantoin Itching  . Ramipril     unknown    FAMILY HISTORY:  Family History  Problem  Relation Age of Onset  . Diabetes Mother   . CVA Mother   . Other Father   . Diabetes Brother   . Cancer Brother     throat ca, smoker  . Aneurysm Brother     brain aneurysm  . Breast cancer Sister   . Emphysema Sister     smoker   SOCIAL HISTORY:  reports that she quit smoking about 23 years ago. Her smoking use included Cigarettes. She has a 40 pack-year smoking history. She has never used smokeless tobacco. She reports that she does not drink alcohol or use illicit drugs.  REVIEW OF SYSTEMS:  See HPI for pertinent positives.  All systems reviewed and otherwise negative.   SUBJECTIVE:   VITAL SIGNS: Temp:  [98.7 F (37.1 C)] 98.7 F (37.1 C) (04/02 2048) Pulse Rate:  [63-69] 65 (04/03 0200) Resp:  [18-23] 22 (04/03 0200) BP: (124-146)/(56-64) 130/59 mmHg (04/03 0200) SpO2:  [89 %-98 %] 91 % (04/03 0200) Weight:  [156 lb (70.761 kg)] 156 lb (70.761 kg) (04/02 2048)  INTAKE / OUTPUT: Intake/Output   None     PHYSICAL EXAMINATION: General:  wdwn elderly female in NAD Neuro:  AAOx4, speech clear, MAE HEENT:  Mm pink/moist, no jvd Cardiovascular:  s1s2 rrr, no m/r/g Lungs:  resp's even/non-labored, lungs bilaterally clear Abdomen:  Round/soft, bsx4 active, non-tender to palpation  Musculoskeletal:  No acute  deformities  Skin:  Warm/dry, no edema.  Dry shingles rash to anterior L chest and left side, no open vesicles / dry & crusty  LABS: ASSESSMENT / PLAN:  PULMONARY No results found for this basename: PHART, PCO2, PCO2ART, PO2, PO2ART, HCO3, TCO2, O2SAT,  in the last 168 hours  CBC  Recent Labs Lab 08/20/13 2114 08/21/13 0740  HGB 11.0* 10.0*  HCT 35.6* 32.3*  WBC 11.8* 9.8  PLT 168 147*    COAGULATION  Recent Labs Lab 08/20/13 2114  INR 2.05*    CARDIAC  No results found for this basename: TROPONINI,  in the last 168 hours No results found for this basename: PROBNP,  in the last 168 hours   CHEMISTRY  Recent Labs Lab 08/20/13 2114 08/21/13 0740  NA 138 138  K 4.5 4.4  CL 100 101  CO2 24 26  GLUCOSE 117* 103*  BUN 25* 20  CREATININE 0.82 0.72  CALCIUM 9.3 8.8   Estimated Creatinine Clearance: 50.7 ml/min (by C-G formula based on Cr of 0.72).   LIVER  Recent Labs Lab 08/20/13 2114  INR 2.05*     INFECTIOUS No results found for this basename: LATICACIDVEN, PROCALCITON,  in the last 168 hours   ENDOCRINE CBG (last 3)  No results found for this basename: GLUCAP,  in the last 72 hours       IMAGING x48h  Dg Hip Complete Left  08/20/2013   CLINICAL DATA:  Status post fall; left hip pain.  EXAM: LEFT HIP - COMPLETE 2+ VIEW  COMPARISON:  None.  FINDINGS: There is no evidence of fracture or dislocation. Both femoral heads are seated normally within their respective acetabula. The proximal left femur appears intact. No significant degenerative change is appreciated. The sacroiliac joints are unremarkable in appearance.  The visualized bowel gas pattern is grossly unremarkable in appearance.  IMPRESSION: No evidence of fracture or dislocation.   Electronically Signed   By: Roanna Raider M.D.   On: 08/20/2013 22:11   Ct Head Wo Contrast  08/20/2013   CLINICAL DATA:  Status post fall; hit  back of head. Concern for cervical spine injury.  EXAM: CT HEAD  WITHOUT CONTRAST  CT CERVICAL SPINE WITHOUT CONTRAST  TECHNIQUE: Multidetector CT imaging of the head and cervical spine was performed following the standard protocol without intravenous contrast. Multiplanar CT image reconstructions of the cervical spine were also generated.  COMPARISON:  CT of the head performed 02/15/2012, and CT of the cervical spine performed 08/25/2011  FINDINGS: CT HEAD FINDINGS  There is no evidence of acute infarction, mass lesion, or intra- or extra-axial hemorrhage on CT.  Prominence of the sulci reflects moderate cortical volume loss. Scattered periventricular and subcortical white matter change likely reflects small vessel ischemic microangiopathy. Cerebellar atrophy is noted, with multiple small chronic infarcts at the right cerebellar hemisphere.  The brainstem and fourth ventricle are within normal limits. The basal ganglia are unremarkable in appearance. The cerebral hemispheres demonstrate grossly normal gray-white differentiation. No mass effect or midline shift is seen.  There is no evidence of fracture; visualized osseous structures are unremarkable in appearance. The orbits are within normal limits. The paranasal sinuses and mastoid air cells are well-aerated. No significant soft tissue abnormalities are seen.  CT CERVICAL SPINE FINDINGS  There is no evidence of fracture or subluxation. Vertebral bodies demonstrate normal height and alignment. Intervertebral disc spaces are preserved. Prevertebral soft tissues are within normal limits. The visualized neural foramina are grossly unremarkable.  The thyroid gland is unremarkable in appearance. Mild atelectasis is noted at the lung apices. Calcification is noted at the carotid bifurcations bilaterally.  There appears to be new dilatation of the descending thoracic aorta to somewhere between 4.8 cm and 6.1 cm in maximal diameter, with blood seen tracking outside of the calcified lumen of the descending thoracic aorta. This appears  to be new from 2013, and may reflect interval dissection or worsening aneurysmal dilatation. There is corresponding dilatation of the aortic arch to 4.2 cm in maximal diameter. Trace left-sided pleural fluid is seen.  IMPRESSION: 1. No evidence of traumatic intracranial injury or fracture. 2. No evidence of fracture or subluxation along the cervical spine. 3. Moderate cortical volume loss and scattered small vessel ischemic microangiopathy. 4. Multiple small chronic infarcts noted at the right cerebellar hemisphere. 5. New dilatation of the descending thoracic aorta to somewhere between 4.8 cm and 6.1 cm in maximal diameter, with blood seen tracking outside of the calcified lumen of the descending thoracic aorta. This appears new from 2013, and may reflect interval acute or chronic dissection, or worsening aneurysmal dilatation. Corresponding dilatation of the aortic arch to 4.2 cm in maximal diameter; trace left pleural fluid seen, of uncertain significance. CTA of the chest is recommended for further evaluation, when and as deemed clinically appropriate. 6. Calcification noted at the carotid bifurcations bilaterally.  These results were called by telephone at the time of interpretation on 08/20/2013 at 9:56 PM to [facility] PA, who verbally acknowledged these results.   Electronically Signed   By: Roanna Raider M.D.   On: 08/20/2013 22:03   Ct Cervical Spine Wo Contrast  08/20/2013   CLINICAL DATA:  Status post fall; hit back of head. Concern for cervical spine injury.  EXAM: CT HEAD WITHOUT CONTRAST  CT CERVICAL SPINE WITHOUT CONTRAST  TECHNIQUE: Multidetector CT imaging of the head and cervical spine was performed following the standard protocol without intravenous contrast. Multiplanar CT image reconstructions of the cervical spine were also generated.  COMPARISON:  CT of the head performed 02/15/2012, and CT of the cervical spine performed 08/25/2011  FINDINGS: CT HEAD FINDINGS  There is no evidence of  acute infarction, mass lesion, or intra- or extra-axial hemorrhage on CT.  Prominence of the sulci reflects moderate cortical volume loss. Scattered periventricular and subcortical white matter change likely reflects small vessel ischemic microangiopathy. Cerebellar atrophy is noted, with multiple small chronic infarcts at the right cerebellar hemisphere.  The brainstem and fourth ventricle are within normal limits. The basal ganglia are unremarkable in appearance. The cerebral hemispheres demonstrate grossly normal gray-white differentiation. No mass effect or midline shift is seen.  There is no evidence of fracture; visualized osseous structures are unremarkable in appearance. The orbits are within normal limits. The paranasal sinuses and mastoid air cells are well-aerated. No significant soft tissue abnormalities are seen.  CT CERVICAL SPINE FINDINGS  There is no evidence of fracture or subluxation. Vertebral bodies demonstrate normal height and alignment. Intervertebral disc spaces are preserved. Prevertebral soft tissues are within normal limits. The visualized neural foramina are grossly unremarkable.  The thyroid gland is unremarkable in appearance. Mild atelectasis is noted at the lung apices. Calcification is noted at the carotid bifurcations bilaterally.  There appears to be new dilatation of the descending thoracic aorta to somewhere between 4.8 cm and 6.1 cm in maximal diameter, with blood seen tracking outside of the calcified lumen of the descending thoracic aorta. This appears to be new from 2013, and may reflect interval dissection or worsening aneurysmal dilatation. There is corresponding dilatation of the aortic arch to 4.2 cm in maximal diameter. Trace left-sided pleural fluid is seen.  IMPRESSION: 1. No evidence of traumatic intracranial injury or fracture. 2. No evidence of fracture or subluxation along the cervical spine. 3. Moderate cortical volume loss and scattered small vessel ischemic  microangiopathy. 4. Multiple small chronic infarcts noted at the right cerebellar hemisphere. 5. New dilatation of the descending thoracic aorta to somewhere between 4.8 cm and 6.1 cm in maximal diameter, with blood seen tracking outside of the calcified lumen of the descending thoracic aorta. This appears new from 2013, and may reflect interval acute or chronic dissection, or worsening aneurysmal dilatation. Corresponding dilatation of the aortic arch to 4.2 cm in maximal diameter; trace left pleural fluid seen, of uncertain significance. CTA of the chest is recommended for further evaluation, when and as deemed clinically appropriate. 6. Calcification noted at the carotid bifurcations bilaterally.  These results were called by telephone at the time of interpretation on 08/20/2013 at 9:56 PM to [facility] PA, who verbally acknowledged these results.   Electronically Signed   By: Roanna Raider M.D.   On: 08/20/2013 22:03   Ct Angio Chest Aorta W/cm &/or Wo/cm  08/21/2013   ADDENDUM REPORT: 08/21/2013 00:56  ADDENDUM: To be more clear, the focal disruption into the aortic media and associated intramural contrast pool occurs from the medial aspect of the true lumen. It is unlikely that this represents an acute injury related to the patient's fall from standing. This is likely chronic/subacute having occurred at some point since the prior imaging in January of 2013.  Regardless, there has been a marked interval increase in the size of the transverse and proximal descending thoracic aorta and the combination of dissection and intramural contrast extravasation is high risk for rupture. Recommend consultation with cardiothoracic and/or vascular surgery prior to discharge for further evaluation as the patient may be a candidate for thoracic endovascular aortic repair.   Electronically Signed   By: Malachy Moan M.D.   On: 08/21/2013 00:56   08/21/2013  CLINICAL DATA:  Fall, aortic dissection a seen on CT scan of  the cervical spine  EXAM: CT ANGIOGRAPHY CHEST, ABDOMEN AND PELVIS  TECHNIQUE: Multidetector CT imaging through the chest, abdomen and pelvis was performed using the standard protocol during bolus administration of intravenous contrast. Multiplanar reconstructed images and MIPs were obtained and reviewed to evaluate the vascular anatomy.  CONTRAST:  OMNIPAQUE IOHEXOL 350 MG/ML SOLN  COMPARISON:  CT scan of the head and cervical spine obtained earlier today  Prior CT scan of the chest 06/07/2011  FINDINGS: CTA CHEST FINDINGS  Mediastinum: Unremarkable appearance of the thyroid gland. Stable mild mediastinal adenopathy. Contained aortic rupture with extravasated intravenous contrast in the periaortic space just lateral to the esophagus and posterior to tracheal air column. No significant extension of the mediastinal hematoma.  Heart/Vascular: Interval development of Stanford type B aortic dissection compared to 06/07/2011. The dissection flap arises from the proximal descending thoracic aorta and extends inferiorly to just above the aortic hiatus. The dissection does not extend into the abdominal aortic aneurysm. In addition to the main dissection flap, there is a focal breech in the media with a relatively large collection of intramural extravasation of contrast material. The pool of intramural contrast gives rise to a right intercostal bronchial trunk. The greatest diameter of the transverse segment of the aorta is 6.4 cm which is enlarged compared to 3.9 cm previously. Stable ectasia of the ascending thoracic aorta with a maximal diameter 4.5 cm. The descending thoracic aorta has slightly enlarged with the dissection measuring 3.8 cm at the level of the pulmonary veins compared to 3.5 cm previously. Unchanged aortic caliber at the hiatus. The aorta remains tortuous.  Mild cardiomegaly with left atrial enlargement. Markedly enlarged main and central pulmonary arteries consistent with pulmonary arterial  hypertension. The main pulmonary artery measures 4.6 cm in diameter. Extensive atherosclerotic vascular calcifications in the coronary arteries. Mitral valve replacement. Mild thickening and calcification of the aortic valve. No pericardial effusion.  Lungs/Pleura: Interlobular septal thickening and scattered areas of patchy ground-glass attenuation airspace opacity consistent with mild pulmonary edema. Dependent atelectasis noted bilaterally. Chronic bronchial wall thickening suggests chronic bronchitis/ COPD. There are a few emphysematous blebs.  Bones/Soft Tissues: No acute fracture or aggressive appearing lytic or blastic osseous lesion. Healed median sternotomy.  Review of the MIP images confirms the above findings.  CTA ABDOMEN AND PELVIS FINDINGS  VASCULAR  Aorta: Below the diaphragm, the aorta demonstrates no evidence of acute dissection. There is aneurysmal dilatation of the infrarenal abdominal aorta with a maximal transverse diameter of 2.7 cm. Scattered atherosclerotic vascular calcifications. No significant mural or wall adherent thrombus.  Celiac: Atherosclerotic plaque at the origin. The vessel remains widely patent. Right hepatic artery is replaced to the SMA.  SMA: Widely patent.  Replaced right hepatic artery.  Renals: Single dominant renal arteries bilaterally. Mild to moderate stenosis of the proximal left renal artery. No changes of fibromuscular dysplasia.  IMA: Patent and slightly atretic.  Inflow: Atherosclerotic vascular calcifications without aneurysmal dilatation or significant stenosis.  Proximal Outflow: Mild atherosclerotic calcifications in the common femoral arteries. The visualized superficial and profunda femoral arteries are patent.  Veins: New focal venous abnormality identified given the limitations of non venous phase timing.  NON-VASCULAR  Abdomen: Unremarkable CT appearance of the stomach, duodenum, spleen, and pancreas save for fatty atrophy. Similar appearance of a mild  adreniform thickening of the adrenal glands bilaterally likely reflecting adrenal hyperplasia. Normal hepatic contour and morphology. No discrete hepatic lesion. Gallbladder  is surgically absent. No intra or extrahepatic biliary ductal dilatation. 5.3 cm exophytic simple cyst from the upper pole of the right kidney. No hydronephrosis. No enhancing renal mass. 1.2 cm exophytic cyst from the interpolar left kidney.  Moderate volume of formed retained stool in the rectum consistent with constipation. Colonic diverticulosis without evidence of active diverticulitis. Normal appendix in the right lower quadrant. No free fluid or suspicious adenopathy.  Pelvis: Unremarkable appearance of the postmenopausal uterus and ovaries. Normal bladder. No free fluid or suspicious adenopathy.  Bones/Soft Tissues: Acute compression fracture of the superior endplate of T12. There is approximately 30% height loss. 3 mm of posterior bony retropulsion from the superior endplate. Chronic bilateral L4 pars defects with mild grade 1 anterolisthesis of L4 on L5.  Review of the MIP images confirms the above findings.  IMPRESSION: CTA CHEST  1. 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 The dissection is complex, in addition to the main true and false lumens there is a focal breech of the media medially with extravasation of an irregular pool of contrast material into the aortic wall. This occurs at the origin of a right intercostal bronchial trunk which opacifies with contrast material from the irregular intramural contrast collection. In this region, the aorta has markedly increased in size currently measuring 6.4 cm compared to 3.9 cm previously. The dissection flap does not extends into the abdominal aorta. 2. Cardiomegaly with left atrial enlargement. 3. Mitral valve replacement. 4. Atherosclerosis including coronary artery disease. 5. Marked enlargement of the main  and central pulmonary arteries consistent with pulmonary arterial hypertension. 6. Pulmonary interstitial edema. 7. COPD. CTA ABD/PELVIS  1. No acute abdominal aortic pathology. 2. Acute T12 compression fracture with approximately 30% height loss and 3 mm posterior bony retropulsion. 3. Mild fusiform aneurysmal dilatation of the infrarenal abdominal aorta with a maximal transverse diameter of 2.7 cm. 4. Colonic diverticulosis without evidence of active diverticulitis. 5. Chronic bilateral L4 pars defects with grade 1 anterolisthesis of L4 on L5. Critical Value/emergent results were called by telephone at the time of interpretation on 08/21/2013 at 12:45 AM to Dr. Blinda Leatherwood, who verbally acknowledged these results.  Electronically Signed: By: Malachy Moan M.D. On: 08/21/2013 00:48   Ct Angio Abd/pel W/ And/or W/o  08/21/2013   ADDENDUM REPORT: 08/21/2013 00:56  ADDENDUM: To be more clear, the focal disruption into the aortic media and associated intramural contrast pool occurs from the medial aspect of the true lumen. It is unlikely that this represents an acute injury related to the patient's fall from standing. This is likely chronic/subacute having occurred at some point since the prior imaging in January of 2013.  Regardless, there has been a marked interval increase in the size of the transverse and proximal descending thoracic aorta and the combination of dissection and intramural contrast extravasation is high risk for rupture. Recommend consultation with cardiothoracic and/or vascular surgery prior to discharge for further evaluation as the patient may be a candidate for thoracic endovascular aortic repair.   Electronically Signed   By: Malachy Moan M.D.   On: 08/21/2013 00:56   08/21/2013   CLINICAL DATA:  Fall, aortic dissection a seen on CT scan of the cervical spine  EXAM: CT ANGIOGRAPHY CHEST, ABDOMEN AND PELVIS  TECHNIQUE: Multidetector CT imaging through the chest, abdomen and pelvis was  performed using the standard protocol during bolus administration of intravenous contrast. Multiplanar reconstructed images and MIPs were obtained and  reviewed to evaluate the vascular anatomy.  CONTRAST:  OMNIPAQUE IOHEXOL 350 MG/ML SOLN  COMPARISON:  CT scan of the head and cervical spine obtained earlier today  Prior CT scan of the chest 06/07/2011  FINDINGS: CTA CHEST FINDINGS  Mediastinum: Unremarkable appearance of the thyroid gland. Stable mild mediastinal adenopathy. Contained aortic rupture with extravasated intravenous contrast in the periaortic space just lateral to the esophagus and posterior to tracheal air column. No significant extension of the mediastinal hematoma.  Heart/Vascular: Interval development of Stanford type B aortic dissection compared to 06/07/2011. The dissection flap arises from the proximal descending thoracic aorta and extends inferiorly to just above the aortic hiatus. The dissection does not extend into the abdominal aortic aneurysm. In addition to the main dissection flap, there is a focal breech in the media with a relatively large collection of intramural extravasation of contrast material. The pool of intramural contrast gives rise to a right intercostal bronchial trunk. The greatest diameter of the transverse segment of the aorta is 6.4 cm which is enlarged compared to 3.9 cm previously. Stable ectasia of the ascending thoracic aorta with a maximal diameter 4.5 cm. The descending thoracic aorta has slightly enlarged with the dissection measuring 3.8 cm at the level of the pulmonary veins compared to 3.5 cm previously. Unchanged aortic caliber at the hiatus. The aorta remains tortuous.  Mild cardiomegaly with left atrial enlargement. Markedly enlarged main and central pulmonary arteries consistent with pulmonary arterial hypertension. The main pulmonary artery measures 4.6 cm in diameter. Extensive atherosclerotic vascular calcifications in the coronary arteries. Mitral  valve replacement. Mild thickening and calcification of the aortic valve. No pericardial effusion.  Lungs/Pleura: Interlobular septal thickening and scattered areas of patchy ground-glass attenuation airspace opacity consistent with mild pulmonary edema. Dependent atelectasis noted bilaterally. Chronic bronchial wall thickening suggests chronic bronchitis/ COPD. There are a few emphysematous blebs.  Bones/Soft Tissues: No acute fracture or aggressive appearing lytic or blastic osseous lesion. Healed median sternotomy.  Review of the MIP images confirms the above findings.  CTA ABDOMEN AND PELVIS FINDINGS  VASCULAR  Aorta: Below the diaphragm, the aorta demonstrates no evidence of acute dissection. There is aneurysmal dilatation of the infrarenal abdominal aorta with a maximal transverse diameter of 2.7 cm. Scattered atherosclerotic vascular calcifications. No significant mural or wall adherent thrombus.  Celiac: Atherosclerotic plaque at the origin. The vessel remains widely patent. Right hepatic artery is replaced to the SMA.  SMA: Widely patent.  Replaced right hepatic artery.  Renals: Single dominant renal arteries bilaterally. Mild to moderate stenosis of the proximal left renal artery. No changes of fibromuscular dysplasia.  IMA: Patent and slightly atretic.  Inflow: Atherosclerotic vascular calcifications without aneurysmal dilatation or significant stenosis.  Proximal Outflow: Mild atherosclerotic calcifications in the common femoral arteries. The visualized superficial and profunda femoral arteries are patent.  Veins: New focal venous abnormality identified given the limitations of non venous phase timing.  NON-VASCULAR  Abdomen: Unremarkable CT appearance of the stomach, duodenum, spleen, and pancreas save for fatty atrophy. Similar appearance of a mild adreniform thickening of the adrenal glands bilaterally likely reflecting adrenal hyperplasia. Normal hepatic contour and morphology. No discrete hepatic  lesion. Gallbladder is surgically absent. No intra or extrahepatic biliary ductal dilatation. 5.3 cm exophytic simple cyst from the upper pole of the right kidney. No hydronephrosis. No enhancing renal mass. 1.2 cm exophytic cyst from the interpolar left kidney.  Moderate volume of formed retained stool in the rectum consistent with constipation. Colonic diverticulosis without evidence  of active diverticulitis. Normal appendix in the right lower quadrant. No free fluid or suspicious adenopathy.  Pelvis: Unremarkable appearance of the postmenopausal uterus and ovaries. Normal bladder. No free fluid or suspicious adenopathy.  Bones/Soft Tissues: Acute compression fracture of the superior endplate of T12. There is approximately 30% height loss. 3 mm of posterior bony retropulsion from the superior endplate. Chronic bilateral L4 pars defects with mild grade 1 anterolisthesis of L4 on L5.  Review of the MIP images confirms the above findings.  IMPRESSION: CTA CHEST  1. 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 The dissection is complex, in addition to the main true and false lumens there is a focal breech of the media medially with extravasation of an irregular pool of contrast material into the aortic wall. This occurs at the origin of a right intercostal bronchial trunk which opacifies with contrast material from the irregular intramural contrast collection. In this region, the aorta has markedly increased in size currently measuring 6.4 cm compared to 3.9 cm previously. The dissection flap does not extends into the abdominal aorta. 2. Cardiomegaly with left atrial enlargement. 3. Mitral valve replacement. 4. Atherosclerosis including coronary artery disease. 5. Marked enlargement of the main and central pulmonary arteries consistent with pulmonary arterial hypertension. 6. Pulmonary interstitial edema. 7. COPD. CTA ABD/PELVIS  1. No acute  abdominal aortic pathology. 2. Acute T12 compression fracture with approximately 30% height loss and 3 mm posterior bony retropulsion. 3. Mild fusiform aneurysmal dilatation of the infrarenal abdominal aorta with a maximal transverse diameter of 2.7 cm. 4. Colonic diverticulosis without evidence of active diverticulitis. 5. Chronic bilateral L4 pars defects with grade 1 anterolisthesis of L4 on L5. Critical Value/emergent results were called by telephone at the time of interpretation on 08/21/2013 at 12:45 AM to Dr. Blinda LeatherwoodPollina, who verbally acknowledged these results.  Electronically Signed: By: Malachy MoanHeath  McCullough M.D. On: 08/21/2013 00:48       PULMONARY A: COPD OSA - intermittent compliance with CPAP at home Oxygen Dependent  PAH P:   -oxygen to support sats > 93% -nocturnal CPAP -pulmonary hygiene -DNI  CARDIOVASCULAR A:  Type B Aortic Dissection  - interval development since 05/2013 Atrial Fibrillation  HTN Hypertrophic Cardiomyopathy  Right Heart Failure P:  -VVS consult -continue amiodarone -hold Cozaar with CTA dye load -hold coumadin, will need to discuss with Cardiology appropriateness of restart with recent falls -DNR   RENAL A:   At Risk AKI - in setting of contrast media P:   -monitor BMP, trend sr cr -gentle hydration  -hold lasix (on m/w/f 20 mg), cozaar, tramadol   GASTROINTESTINAL A:   No acute process P:   -allow heart healthy diet as tolerated  -continue probiotic   HEMATOLOGIC A:   Coagulopathy - in setting of coumadin administration P:  -hold coumadin -no reversal of INR, will allow to drift down  INFECTIOUS A:   R/O UTI - as source of falls in elderly  Shingles - recent outbreak on L chest wall, s/p valtrex  P:   -assess UA -trend WBC / fever curve  ENDOCRINE A:   Hypothyroidism - last TSH 2/22 2.267 P:   -continue synthroid  NEUROLOGIC A:   Pain - post fall, T12 compression fx T12 Compression Fx P:   -PRN percocet for pain   -Neuro checks Q4 for 12 hours then PRN   Canary BrimBrandi Ollis, NP-C [facility] Pgr: [phone] or [phone] 08/21/2013,  3:38 AM   STAFF NOTE: I, Dr Lavinia Sharps have personally reviewed patient's available data, including medical history, events of note, physical examination and test results as part of my evaluation. I have discussed with resident/NP and other care providers such as pharmacist, RN and RRT.  In addition,  I personally evaluated patient and elicited key findings of Type B dissection. Agree with cards she is too high risk for surgical repair. Per VVS , meets indication for endovascular repair due to size but ia asymptomatic so on expectant folllowup. Currently plan is to control BP and get her home if VVS and cards on board for 08/22/13. NP/medical resident whose note is outlined above and that I agree with   Dr. Kalman Shan, M.D., [facility].C.P Pulmonary and Critical Care Medicine Staff Physician [facility] Pager: [phone], If no answer or between  15:00h - 7:00h: call [phone]  08/21/2013 4:00 PM

## 2013-08-21 NOTE — ED Notes (Addendum)
Per Dr. Allena KatzPatel, Pt being referred to Critical Care and will be getting another bed assignment.

## 2013-08-21 NOTE — Care Management Note (Signed)
    Page 1 of 1   08/21/2013     8:18:19 AM   CARE MANAGEMENT NOTE 08/21/2013  Patient:  Patricia [name],Patricia [name]   Account Number:  [id number]  Date Initiated:  08/21/2013  Documentation initiated by:  Junius CreamerWELL,DEBBIE  Subjective/Objective Assessment:   fall, disecting thoracic aneurysm     Action/Plan:   lives w husband, pcp dr Radio producerwalter pharr   Anticipated DC Date:     Anticipated DC Plan:           Choice offered to / List presented to:             Status of service:   Medicare Important Message given?   (If response is "NO", the following Medicare IM given date fields will be blank) Date Medicare IM given:   Date Additional Medicare IM given:    Discharge Disposition:    Per UR Regulation:  Reviewed for med. necessity/level of care/duration of stay  If discussed at Long Length of Stay Meetings, dates discussed:    Comments:

## 2013-08-21 NOTE — Progress Notes (Signed)
Report called to 5W 

## 2013-08-21 NOTE — Consult Note (Addendum)
Full consult note to be dictated  Briefly, this is an 80 yo female with multiple comorbidities including severe MS with hypertrophic cardiomyopathy and heart failure.  She was admitted for her 3rd fall recently with T12 compression fracture.  She is on coumadin for AFIB.  An incentendal finding on CT scan was a type III dissection.   This was not seen on her most recent CT scan in 2013.  She reports an episode of severe chest pain about one month ago which resolved and was thought to be secondary to shingles for which she takes valtrex.  She denies current chest of abdominal pain.  She does not have renal insufficiency .  She has palpable pedal pulses.    I discussed with the patient and daughter the CT findings.  It is unknown when she developed her type III dissection.  It could potentially have been one month ago.  Regardless, she is currently asymptomatic.  The only indication for repair currently would be the size (6.2cm).  She has no symptoms of malperfusion.  Repair is complicated by her co-morbidities.  I have asked cardiology to evaluate her risk for general anesthesia.  She needs medical management currently which would consist of blood pressure control.  I would not recommend endovascular repair during this admission as long as she remains asymptomatic.  I would like her to follow up in clinic in 4 weeks with a repeat CTA of the chest to see if there has been any interval increase in size of the aneurysm.   Durene CalWells Fredrica Capano

## 2013-08-21 NOTE — ED Notes (Signed)
Patricia [name] Daughter @ [phone]

## 2013-08-21 NOTE — Consult Note (Signed)
HPI: 80 year old female with past medical history of paroxysmal atrial fibrillation, prior mitral valve replacement, hypertrophic obstructive cardiomyopathy, severe home O2 dependent COPD for preoperative evaluation prior to thoracic aortic aneurysm repair. Patient is status post mitral valve replacement with a tissue valve in 2007. Patient had cardiac catheterization in February 2013. This revealed no obstructive coronary disease. Ejection fraction 65%. PA pressure 53/20. Pulmonary capillary wedge pressure 24. Most recent echocardiogram in February 2015 showed normal LV function, moderate left ventricular hypertrophy, mild aortic insufficiency, mild mitral regurgitation, biatrial enlargement, severe right ventricular enlargement and severe pulmonary hypertension. Mean gradient across the mitral valve was 9-13 mm of mercury. Patient fell yesterday. She denies syncope. Workup included CT scans one of which revealed a thoracic aortic aneurysm. There is aortic dissection of the descending thoracic aorta. The aorta measures 6.4 cm compared to previous at 3.9 cm. There is atherosclerosis involving the coronary arteries as well. Patient has been seen by vascular surgery and cardiology is asked to evaluate preoperatively. She has chronic significant dyspnea on exertion. Occasional orthopnea. No PND. Occasional pedal edema. She denies exertional chest pain at present.  Medications Prior to Admission  Medication Sig Dispense Refill  . amiodarone (PACERONE) 200 MG tablet Take 100 mg by mouth daily.      . calcitonin, salmon, (MIACALCIN/FORTICAL) 200 UNIT/ACT nasal spray Place 1 spray into alternate nostrils daily.       . Calcium Carbonate-Vitamin D (CALCIUM 500 + D PO) Take 1 tablet by mouth daily.      . cholecalciferol (VITAMIN D) 1000 UNITS tablet Take 1,000 Units by mouth at bedtime.       . Coenzyme Q10 (COQ10 PO) Take 300 mg by mouth daily.      . Flaxseed, Linseed, (TH FLAX SEED OIL PO) Take 1 capsule  by mouth daily.      . furosemide (LASIX) 20 MG tablet Take 20 mg by mouth 3 (three) times a week. Monday, Wednesday and friday      . levothyroxine (SYNTHROID, LEVOTHROID) 125 MCG tablet Take 62.5 mcg by mouth daily before breakfast.      . lidocaine (XYLOCAINE) 2 % jelly Apply 1 application topically daily as needed (pain).      [name] losartan (COZAAR) 100 MG tablet Take 1 tablet (100 mg total) by mouth daily.  30 tablet  6  . nitroGLYCERIN (NITROSTAT) 0.4 MG SL tablet Place 0.4 mg under the tongue every 5 (five) minutes as needed for chest pain.      [name] OVER THE COUNTER MEDICATION Apply 1 application topically daily as needed (pain). Shingles rescue      . Probiotic Product (PROBIOTIC DAILY PO) Take 1 tablet by mouth daily.      . sertraline (ZOLOFT) 100 MG tablet Take 100 mg by mouth daily.      . traMADol (ULTRAM) 50 MG tablet Take 50 mg by mouth 3 (three) times daily as needed for moderate pain.       [name] warfarin (COUMADIN) 1 MG tablet Take 1 mg by mouth 2 (two) times a week. Take along with the 2.37m only on Tuesday and Thursday      . warfarin (COUMADIN) 5 MG tablet Take 2.5 mg by mouth daily. On Tuesday and Thursday take along with the 130mtablet      . valACYclovir (VALTREX) 1000 MG tablet Take 1 tablet (1,000 mg total) by mouth 3 (three) times daily.  21 tablet  0    Allergies  Allergen Reactions  .  Ivp Dye [Iodinated Diagnostic Agents] Itching  . Nitrofurantoin Itching  . Ramipril     unknown    Past Medical History  Diagnosis Date  . CAD (coronary artery disease)   . Mitral valve disorder     a. s/p MVR;  b. 06/2013 Echo: EF 55-60%, no rwma, Ao sclerosis, mild AI, mild MR, miod dil LA, sev dil RV with reduced fxn, sev dil RA, mod TR/PR, PASP 63mHg (study reviewed by Dr. KLesly Dukes MS felt to be sev, peak grad 39/mean 13).  .Marland KitchenHTN (hypertension)   . Dyslipidemia   . Aortic stenosis, mild   . Breast cyst   . Hypertrophic cardiomyopathy   . Sleep apnea   . COPD (chronic obstructive  pulmonary disease)     "a touch of"; prn o2 at home with activity   . Atrial fibrillation     a. chronic coumadin.  .Marland KitchenHypothyroidism   . Anemia   . Chronic right-sided heart failure     Past Surgical History  Procedure Laterality Date  . Laparoscopic cholecystectomy  2009  . Cataract extraction  2003    bilateral  . Mitral valve replacement  2007  . Neck mass excision  2003    benign  . Colonoscopy    . Broken wrist      left, metal implanted   . Broken ankle      right, metal implanted   . Cardiac catheterization  06/2011  . Breast surgery      bilateral cysts x3 removed, benign   . Tee without cardioversion  01/31/2012    Procedure: TRANSESOPHAGEAL ECHOCARDIOGRAM (TEE);  Surgeon: PFay Records MD;  [facility]  Service: Cardiovascular;  Laterality: N/A;  . Cardioversion N/A 01/16/2013    Procedure: CARDIOVERSION;  Surgeon: PThayer Headings MD;  [facility]  Service: Cardiovascular;  Laterality: N/A;    History   Social History  . Marital Status: Married    Spouse Name: N/A    Number of Children: N/A  . Years of Education: N/A   Occupational History  . Retired    Social History Main Topics  . Smoking status: Former Smoker -- 1.00 packs/day for 40 years    Types: Cigarettes    Quit date: 05/21/1990  . Smokeless tobacco: Never Used  . Alcohol Use: No  . Drug Use: No  . Sexual Activity: No   Other Topics Concern  . Not on file   Social History Narrative   Lives with her husband.  Drives and walks around without assist device.            Family History  Problem Relation Age of Onset  . Diabetes Mother   . CVA Mother   . Other Father   . Diabetes Brother   . Cancer Brother     throat ca, smoker  . Aneurysm Brother     brain aneurysm  . Breast cancer Sister   . Emphysema Sister     smoker    ROS: recent back pain but no fevers or chills, productive cough, hemoptysis, dysphasia, odynophagia, melena, hematochezia, dysuria,  hematuria, rash, seizure activity, claudication. Remaining systems are negative.  Physical Exam:   Blood pressure 168/72, pulse 63, temperature 99.1 F (37.3 C), temperature source Oral, resp. rate 21, height _0  (1.626 m), weight 156 lb 15.5 oz (71.2 kg), SpO2 93.00%.  General:  Well developed/well nourished in NAD Skin warm/dry Patient not depressed No peripheral clubbing Back-normal HEENT-normal/normal eyelids Neck supple/normal  carotid upstroke bilaterally; no bruits; no JVD; no thyromegaly chest - CTA/ normal expansion CV - RRR/normal S1 and S2; 3/6 systolic murmur left sternal border  Increasing with Valsalva. 2/6 systolic murmur apex.;  PMI nondisplaced Abdomen -NT/ND, no HSM, no mass, + bowel sounds, no bruit 2+ femoral pulses, no bruits Ext-no edema, no chords, 2+ DP Neuro-grossly nonfocal  ECG 07/22/2013-sinus rhythm, first degree AV block, left ventricular hypertrophy with QRS widening and repolarization abnormality.  Results for orders placed during the hospital encounter of 08/20/13 (from the past 48 hour(s))  BASIC METABOLIC PANEL     Status: Abnormal   Collection Time    08/20/13  9:14 PM      Result Value Ref Range   Sodium 138  137 - 147 mEq/L   Potassium 4.5  3.7 - 5.3 mEq/L   Chloride 100  96 - 112 mEq/L   CO2 24  19 - 32 mEq/L   Glucose, Bld 117 (*) 70 - 99 mg/dL   BUN 25 (*) 6 - 23 mg/dL   Creatinine, Ser 0.82  0.50 - 1.10 mg/dL   Calcium 9.3  8.4 - 10.5 mg/dL   GFR calc non Af Amer 64 (*) >90 mL/min   GFR calc Af Amer 74 (*) >90 mL/min   Comment: (NOTE)     The eGFR has been calculated using the CKD EPI equation.     This calculation has not been validated in all clinical situations.     eGFR's persistently <90 mL/min signify possible Chronic Kidney     Disease.  CBC WITH DIFFERENTIAL     Status: Abnormal   Collection Time    08/20/13  9:14 PM      Result Value Ref Range   WBC 11.8 (*) 4.0 - 10.5 K/uL   RBC 4.54  3.87 - 5.11 MIL/uL   Hemoglobin  11.0 (*) 12.0 - 15.0 g/dL   HCT 35.6 (*) 36.0 - 46.0 %   MCV 78.4  78.0 - 100.0 fL   MCH 24.2 (*) 26.0 - 34.0 pg   MCHC 30.9  30.0 - 36.0 g/dL   RDW 20.0 (*) 11.5 - 15.5 %   Platelets 168  150 - 400 K/uL   Neutrophils Relative % 88 (*) 43 - 77 %   Neutro Abs 10.3 (*) 1.7 - 7.7 K/uL   Lymphocytes Relative 5 (*) 12 - 46 %   Lymphs Abs 0.6 (*) 0.7 - 4.0 K/uL   Monocytes Relative 6  3 - 12 %   Monocytes Absolute 0.7  0.1 - 1.0 K/uL   Eosinophils Relative 1  0 - 5 %   Eosinophils Absolute 0.1  0.0 - 0.7 K/uL   Basophils Relative 0  0 - 1 %   Basophils Absolute 0.0  0.0 - 0.1 K/uL  PROTIME-INR     Status: Abnormal   Collection Time    08/20/13  9:14 PM      Result Value Ref Range   Prothrombin Time 22.5 (*) 11.6 - 15.2 seconds   INR 2.05 (*) 0.00 - 1.49  TYPE AND SCREEN     Status: None   Collection Time    08/20/13  9:14 PM      Result Value Ref Range   ABO/RH(D) O POS     Antibody Screen NEG     Sample Expiration 08/23/2013    MRSA PCR SCREENING     Status: None   Collection Time    08/21/13  5:10 AM  Result Value Ref Range   MRSA by PCR NEGATIVE  NEGATIVE   Comment:            The GeneXpert MRSA Assay (FDA     approved for NASAL specimens     only), is one component of a     comprehensive MRSA colonization     surveillance program. It is not     intended to diagnose MRSA     infection nor to guide or     monitor treatment for     MRSA infections.  URINALYSIS, ROUTINE W REFLEX MICROSCOPIC     Status: None   Collection Time    08/21/13  7:30 AM      Result Value Ref Range   Color, Urine YELLOW  YELLOW   APPearance CLEAR  CLEAR   Specific Gravity, Urine 1.015  1.005 - 1.030   pH 5.0  5.0 - 8.0   Glucose, UA NEGATIVE  NEGATIVE mg/dL   Hgb urine dipstick NEGATIVE  NEGATIVE   Bilirubin Urine NEGATIVE  NEGATIVE   Ketones, ur NEGATIVE  NEGATIVE mg/dL   Protein, ur NEGATIVE  NEGATIVE mg/dL   Urobilinogen, UA 1.0  0.0 - 1.0 mg/dL   Nitrite NEGATIVE  NEGATIVE    Leukocytes, UA NEGATIVE  NEGATIVE   Comment: MICROSCOPIC NOT DONE ON URINES WITH NEGATIVE PROTEIN, BLOOD, LEUKOCYTES, NITRITE, OR GLUCOSE <1000 mg/dL.  CBC     Status: Abnormal   Collection Time    08/21/13  7:40 AM      Result Value Ref Range   WBC 9.8  4.0 - 10.5 K/uL   RBC 4.12  3.87 - 5.11 MIL/uL   Hemoglobin 10.0 (*) 12.0 - 15.0 g/dL   HCT 32.3 (*) 36.0 - 46.0 %   MCV 78.4  78.0 - 100.0 fL   MCH 24.3 (*) 26.0 - 34.0 pg   MCHC 31.0  30.0 - 36.0 g/dL   RDW 19.9 (*) 11.5 - 15.5 %   Platelets 147 (*) 150 - 400 K/uL  BASIC METABOLIC PANEL     Status: Abnormal   Collection Time    08/21/13  7:40 AM      Result Value Ref Range   Sodium 138  137 - 147 mEq/L   Potassium 4.4  3.7 - 5.3 mEq/L   Chloride 101  96 - 112 mEq/L   CO2 26  19 - 32 mEq/L   Glucose, Bld 103 (*) 70 - 99 mg/dL   BUN 20  6 - 23 mg/dL   Creatinine, Ser 0.72  0.50 - 1.10 mg/dL   Calcium 8.8  8.4 - 10.5 mg/dL   GFR calc non Af Amer 77 (*) >90 mL/min   GFR calc Af Amer 89 (*) >90 mL/min   Comment: (NOTE)     The eGFR has been calculated using the CKD EPI equation.     This calculation has not been validated in all clinical situations.     eGFR's persistently <90 mL/min signify possible Chronic Kidney     Disease.    Dg Hip Complete Left  08/20/2013   CLINICAL DATA:  Status post fall; left hip pain.  EXAM: LEFT HIP - COMPLETE 2+ VIEW  COMPARISON:  None.  FINDINGS: There is no evidence of fracture or dislocation. Both femoral heads are seated normally within their respective acetabula. The proximal left femur appears intact. No significant degenerative change is appreciated. The sacroiliac joints are unremarkable in appearance.  The visualized bowel gas pattern is  grossly unremarkable in appearance.  IMPRESSION: No evidence of fracture or dislocation.   Electronically Signed   By: Garald Balding M.D.   On: 08/20/2013 22:11   Ct Head Wo Contrast  08/20/2013   CLINICAL DATA:  Status post fall; hit back of head. Concern  for cervical spine injury.  EXAM: CT HEAD WITHOUT CONTRAST  CT CERVICAL SPINE WITHOUT CONTRAST  TECHNIQUE: Multidetector CT imaging of the head and cervical spine was performed following the standard protocol without intravenous contrast. Multiplanar CT image reconstructions of the cervical spine were also generated.  COMPARISON:  CT of the head performed 02/15/2012, and CT of the cervical spine performed 08/25/2011  FINDINGS: CT HEAD FINDINGS  There is no evidence of acute infarction, mass lesion, or intra- or extra-axial hemorrhage on CT.  Prominence of the sulci reflects moderate cortical volume loss. Scattered periventricular and subcortical white matter change likely reflects small vessel ischemic microangiopathy. Cerebellar atrophy is noted, with multiple small chronic infarcts at the right cerebellar hemisphere.  The brainstem and fourth ventricle are within normal limits. The basal ganglia are unremarkable in appearance. The cerebral hemispheres demonstrate grossly normal gray-white differentiation. No mass effect or midline shift is seen.  There is no evidence of fracture; visualized osseous structures are unremarkable in appearance. The orbits are within normal limits. The paranasal sinuses and mastoid air cells are well-aerated. No significant soft tissue abnormalities are seen.  CT CERVICAL SPINE FINDINGS  There is no evidence of fracture or subluxation. Vertebral bodies demonstrate normal height and alignment. Intervertebral disc spaces are preserved. Prevertebral soft tissues are within normal limits. The visualized neural foramina are grossly unremarkable.  The thyroid gland is unremarkable in appearance. Mild atelectasis is noted at the lung apices. Calcification is noted at the carotid bifurcations bilaterally.  There appears to be new dilatation of the descending thoracic aorta to somewhere between 4.8 cm and 6.1 cm in maximal diameter, with blood seen tracking outside of the calcified lumen of the  descending thoracic aorta. This appears to be new from 2013, and may reflect interval dissection or worsening aneurysmal dilatation. There is corresponding dilatation of the aortic arch to 4.2 cm in maximal diameter. Trace left-sided pleural fluid is seen.  IMPRESSION: 1. No evidence of traumatic intracranial injury or fracture. 2. No evidence of fracture or subluxation along the cervical spine. 3. Moderate cortical volume loss and scattered small vessel ischemic microangiopathy. 4. Multiple small chronic infarcts noted at the right cerebellar hemisphere. 5. New dilatation of the descending thoracic aorta to somewhere between 4.8 cm and 6.1 cm in maximal diameter, with blood seen tracking outside of the calcified lumen of the descending thoracic aorta. This appears new from 2013, and may reflect interval acute or chronic dissection, or worsening aneurysmal dilatation. Corresponding dilatation of the aortic arch to 4.2 cm in maximal diameter; trace left pleural fluid seen, of uncertain significance. CTA of the chest is recommended for further evaluation, when and as deemed clinically appropriate. 6. Calcification noted at the carotid bifurcations bilaterally.  These results were called by telephone at the time of interpretation on 08/20/2013 at 9:56 PM to [employer] PA, who verbally acknowledged these results.   Electronically Signed   By: Garald Balding M.D.   On: 08/20/2013 22:03   Ct Cervical Spine Wo Contrast  08/20/2013   CLINICAL DATA:  Status post fall; hit back of head. Concern for cervical spine injury.  EXAM: CT HEAD WITHOUT CONTRAST  CT CERVICAL SPINE WITHOUT CONTRAST  TECHNIQUE: Multidetector  CT imaging of the head and cervical spine was performed following the standard protocol without intravenous contrast. Multiplanar CT image reconstructions of the cervical spine were also generated.  COMPARISON:  CT of the head performed 02/15/2012, and CT of the cervical spine performed 08/25/2011  FINDINGS: CT  HEAD FINDINGS  There is no evidence of acute infarction, mass lesion, or intra- or extra-axial hemorrhage on CT.  Prominence of the sulci reflects moderate cortical volume loss. Scattered periventricular and subcortical white matter change likely reflects small vessel ischemic microangiopathy. Cerebellar atrophy is noted, with multiple small chronic infarcts at the right cerebellar hemisphere.  The brainstem and fourth ventricle are within normal limits. The basal ganglia are unremarkable in appearance. The cerebral hemispheres demonstrate grossly normal gray-white differentiation. No mass effect or midline shift is seen.  There is no evidence of fracture; visualized osseous structures are unremarkable in appearance. The orbits are within normal limits. The paranasal sinuses and mastoid air cells are well-aerated. No significant soft tissue abnormalities are seen.  CT CERVICAL SPINE FINDINGS  There is no evidence of fracture or subluxation. Vertebral bodies demonstrate normal height and alignment. Intervertebral disc spaces are preserved. Prevertebral soft tissues are within normal limits. The visualized neural foramina are grossly unremarkable.  The thyroid gland is unremarkable in appearance. Mild atelectasis is noted at the lung apices. Calcification is noted at the carotid bifurcations bilaterally.  There appears to be new dilatation of the descending thoracic aorta to somewhere between 4.8 cm and 6.1 cm in maximal diameter, with blood seen tracking outside of the calcified lumen of the descending thoracic aorta. This appears to be new from 2013, and may reflect interval dissection or worsening aneurysmal dilatation. There is corresponding dilatation of the aortic arch to 4.2 cm in maximal diameter. Trace left-sided pleural fluid is seen.  IMPRESSION: 1. No evidence of traumatic intracranial injury or fracture. 2. No evidence of fracture or subluxation along the cervical spine. 3. Moderate cortical volume loss  and scattered small vessel ischemic microangiopathy. 4. Multiple small chronic infarcts noted at the right cerebellar hemisphere. 5. New dilatation of the descending thoracic aorta to somewhere between 4.8 cm and 6.1 cm in maximal diameter, with blood seen tracking outside of the calcified lumen of the descending thoracic aorta. This appears new from 2013, and may reflect interval acute or chronic dissection, or worsening aneurysmal dilatation. Corresponding dilatation of the aortic arch to 4.2 cm in maximal diameter; trace left pleural fluid seen, of uncertain significance. CTA of the chest is recommended for further evaluation, when and as deemed clinically appropriate. 6. Calcification noted at the carotid bifurcations bilaterally.  These results were called by telephone at the time of interpretation on 08/20/2013 at 9:56 PM to [facility] PA, who verbally acknowledged these results.   Electronically Signed   By: Garald Balding M.D.   On: 08/20/2013 22:03   Ct Angio Chest Aorta W/cm &/or Wo/cm  08/21/2013   ADDENDUM REPORT: 08/21/2013 00:56  ADDENDUM: To be more clear, the focal disruption into the aortic media and associated intramural contrast pool occurs from the medial aspect of the true lumen. It is unlikely that this represents an acute injury related to the patient's fall from standing. This is likely chronic/subacute having occurred at some point since the prior imaging in January of 2013.  Regardless, there has been a marked interval increase in the size of the transverse and proximal descending thoracic aorta and the combination of dissection and intramural contrast extravasation is high risk  for rupture. Recommend consultation with cardiothoracic and/or vascular surgery prior to discharge for further evaluation as the patient may be a candidate for thoracic endovascular aortic repair.   Electronically Signed   By: Jacqulynn Cadet M.D.   On: 08/21/2013 00:56   08/21/2013   CLINICAL DATA:  Fall,  aortic dissection a seen on CT scan of the cervical spine  EXAM: CT ANGIOGRAPHY CHEST, ABDOMEN AND PELVIS  TECHNIQUE: Multidetector CT imaging through the chest, abdomen and pelvis was performed using the standard protocol during bolus administration of intravenous contrast. Multiplanar reconstructed images and MIPs were obtained and reviewed to evaluate the vascular anatomy.  CONTRAST:  181m OMNIPAQUE IOHEXOL 350 MG/ML SOLN  COMPARISON:  CT scan of the head and cervical spine obtained earlier today  Prior CT scan of the chest 06/07/2011  FINDINGS: CTA CHEST FINDINGS  Mediastinum: Unremarkable appearance of the thyroid gland. Stable mild mediastinal adenopathy. Contained aortic rupture with extravasated intravenous contrast in the periaortic space just lateral to the esophagus and posterior to tracheal air column. No significant extension of the mediastinal hematoma.  Heart/Vascular: Interval development of Stanford type B aortic dissection compared to 06/07/2011. The dissection flap arises from the proximal descending thoracic aorta and extends inferiorly to just above the aortic hiatus. The dissection does not extend into the abdominal aortic aneurysm. In addition to the main dissection flap, there is a focal breech in the media with a relatively large collection of intramural extravasation of contrast material. The pool of intramural contrast gives rise to a right intercostal bronchial trunk. The greatest diameter of the transverse segment of the aorta is 6.4 cm which is enlarged compared to 3.9 cm previously. Stable ectasia of the ascending thoracic aorta with a maximal diameter 4.5 cm. The descending thoracic aorta has slightly enlarged with the dissection measuring 3.8 cm at the level of the pulmonary veins compared to 3.5 cm previously. Unchanged aortic caliber at the hiatus. The aorta remains tortuous.  Mild cardiomegaly with left atrial enlargement. Markedly enlarged main and central pulmonary arteries  consistent with pulmonary arterial hypertension. The main pulmonary artery measures 4.6 cm in diameter. Extensive atherosclerotic vascular calcifications in the coronary arteries. Mitral valve replacement. Mild thickening and calcification of the aortic valve. No pericardial effusion.  Lungs/Pleura: Interlobular septal thickening and scattered areas of patchy ground-glass attenuation airspace opacity consistent with mild pulmonary edema. Dependent atelectasis noted bilaterally. Chronic bronchial wall thickening suggests chronic bronchitis/ COPD. There are a few emphysematous blebs.  Bones/Soft Tissues: No acute fracture or aggressive appearing lytic or blastic osseous lesion. Healed median sternotomy.  Review of the MIP images confirms the above findings.  CTA ABDOMEN AND PELVIS FINDINGS  VASCULAR  Aorta: Below the diaphragm, the aorta demonstrates no evidence of acute dissection. There is aneurysmal dilatation of the infrarenal abdominal aorta with a maximal transverse diameter of 2.7 cm. Scattered atherosclerotic vascular calcifications. No significant mural or wall adherent thrombus.  Celiac: Atherosclerotic plaque at the origin. The vessel remains widely patent. Right hepatic artery is replaced to the SMA.  SMA: Widely patent.  Replaced right hepatic artery.  Renals: Single dominant renal arteries bilaterally. Mild to moderate stenosis of the proximal left renal artery. No changes of fibromuscular dysplasia.  IMA: Patent and slightly atretic.  Inflow: Atherosclerotic vascular calcifications without aneurysmal dilatation or significant stenosis.  Proximal Outflow: Mild atherosclerotic calcifications in the common femoral arteries. The visualized superficial and profunda femoral arteries are patent.  Veins: New focal venous abnormality identified given the limitations of non  venous phase timing.  NON-VASCULAR  Abdomen: Unremarkable CT appearance of the stomach, duodenum, spleen, and pancreas save for fatty  atrophy. Similar appearance of a mild adreniform thickening of the adrenal glands bilaterally likely reflecting adrenal hyperplasia. Normal hepatic contour and morphology. No discrete hepatic lesion. Gallbladder is surgically absent. No intra or extrahepatic biliary ductal dilatation. 5.3 cm exophytic simple cyst from the upper pole of the right kidney. No hydronephrosis. No enhancing renal mass. 1.2 cm exophytic cyst from the interpolar left kidney.  Moderate volume of formed retained stool in the rectum consistent with constipation. Colonic diverticulosis without evidence of active diverticulitis. Normal appendix in the right lower quadrant. No free fluid or suspicious adenopathy.  Pelvis: Unremarkable appearance of the postmenopausal uterus and ovaries. Normal bladder. No free fluid or suspicious adenopathy.  Bones/Soft Tissues: Acute compression fracture of the superior endplate of B86. There is approximately 30% height loss. 3 mm of posterior bony retropulsion from the superior endplate. Chronic bilateral L4 pars defects with mild grade 1 anterolisthesis of L4 on L5.  Review of the MIP images confirms the above findings.  IMPRESSION: CTA CHEST  1. 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 The dissection is complex, in addition to the main true and false lumens there is a focal breech of the media medially with extravasation of an irregular pool of contrast material into the aortic wall. This occurs at the origin of a right intercostal bronchial trunk which opacifies with contrast material from the irregular intramural contrast collection. In this region, the aorta has markedly increased in size currently measuring 6.4 cm compared to 3.9 cm previously. The dissection flap does not extends into the abdominal aorta. 2. Cardiomegaly with left atrial enlargement. 3. Mitral valve replacement. 4. Atherosclerosis including coronary artery  disease. 5. Marked enlargement of the main and central pulmonary arteries consistent with pulmonary arterial hypertension. 6. Pulmonary interstitial edema. 7. COPD. CTA ABD/PELVIS  1. No acute abdominal aortic pathology. 2. Acute T12 compression fracture with approximately 30% height loss and 3 mm posterior bony retropulsion. 3. Mild fusiform aneurysmal dilatation of the infrarenal abdominal aorta with a maximal transverse diameter of 2.7 cm. 4. Colonic diverticulosis without evidence of active diverticulitis. 5. Chronic bilateral L4 pars defects with grade 1 anterolisthesis of L4 on L5. Critical Value/emergent results were called by telephone at the time of interpretation on 08/21/2013 at 12:45 AM to Dr. Betsey Holiday, who verbally acknowledged these results.  Electronically Signed: By: Jacqulynn Cadet M.D. On: 08/21/2013 00:48   Ct Angio Abd/pel W/ And/or W/o  08/21/2013   ADDENDUM REPORT: 08/21/2013 00:56  ADDENDUM: To be more clear, the focal disruption into the aortic media and associated intramural contrast pool occurs from the medial aspect of the true lumen. It is unlikely that this represents an acute injury related to the patient's fall from standing. This is likely chronic/subacute having occurred at some point since the prior imaging in January of 2013.  Regardless, there has been a marked interval increase in the size of the transverse and proximal descending thoracic aorta and the combination of dissection and intramural contrast extravasation is high risk for rupture. Recommend consultation with cardiothoracic and/or vascular surgery prior to discharge for further evaluation as the patient may be a candidate for thoracic endovascular aortic repair.   Electronically Signed   By: Jacqulynn Cadet M.D.   On: 08/21/2013 00:56   08/21/2013   CLINICAL DATA:  Fall, aortic dissection a  seen on CT scan of the cervical spine  EXAM: CT ANGIOGRAPHY CHEST, ABDOMEN AND PELVIS  TECHNIQUE: Multidetector CT imaging  through the chest, abdomen and pelvis was performed using the standard protocol during bolus administration of intravenous contrast. Multiplanar reconstructed images and MIPs were obtained and reviewed to evaluate the vascular anatomy.  CONTRAST:  176m OMNIPAQUE IOHEXOL 350 MG/ML SOLN  COMPARISON:  CT scan of the head and cervical spine obtained earlier today  Prior CT scan of the chest 06/07/2011  FINDINGS: CTA CHEST FINDINGS  Mediastinum: Unremarkable appearance of the thyroid gland. Stable mild mediastinal adenopathy. Contained aortic rupture with extravasated intravenous contrast in the periaortic space just lateral to the esophagus and posterior to tracheal air column. No significant extension of the mediastinal hematoma.  Heart/Vascular: Interval development of Stanford type B aortic dissection compared to 06/07/2011. The dissection flap arises from the proximal descending thoracic aorta and extends inferiorly to just above the aortic hiatus. The dissection does not extend into the abdominal aortic aneurysm. In addition to the main dissection flap, there is a focal breech in the media with a relatively large collection of intramural extravasation of contrast material. The pool of intramural contrast gives rise to a right intercostal bronchial trunk. The greatest diameter of the transverse segment of the aorta is 6.4 cm which is enlarged compared to 3.9 cm previously. Stable ectasia of the ascending thoracic aorta with a maximal diameter 4.5 cm. The descending thoracic aorta has slightly enlarged with the dissection measuring 3.8 cm at the level of the pulmonary veins compared to 3.5 cm previously. Unchanged aortic caliber at the hiatus. The aorta remains tortuous.  Mild cardiomegaly with left atrial enlargement. Markedly enlarged main and central pulmonary arteries consistent with pulmonary arterial hypertension. The main pulmonary artery measures 4.6 cm in diameter. Extensive atherosclerotic vascular  calcifications in the coronary arteries. Mitral valve replacement. Mild thickening and calcification of the aortic valve. No pericardial effusion.  Lungs/Pleura: Interlobular septal thickening and scattered areas of patchy ground-glass attenuation airspace opacity consistent with mild pulmonary edema. Dependent atelectasis noted bilaterally. Chronic bronchial wall thickening suggests chronic bronchitis/ COPD. There are a few emphysematous blebs.  Bones/Soft Tissues: No acute fracture or aggressive appearing lytic or blastic osseous lesion. Healed median sternotomy.  Review of the MIP images confirms the above findings.  CTA ABDOMEN AND PELVIS FINDINGS  VASCULAR  Aorta: Below the diaphragm, the aorta demonstrates no evidence of acute dissection. There is aneurysmal dilatation of the infrarenal abdominal aorta with a maximal transverse diameter of 2.7 cm. Scattered atherosclerotic vascular calcifications. No significant mural or wall adherent thrombus.  Celiac: Atherosclerotic plaque at the origin. The vessel remains widely patent. Right hepatic artery is replaced to the SMA.  SMA: Widely patent.  Replaced right hepatic artery.  Renals: Single dominant renal arteries bilaterally. Mild to moderate stenosis of the proximal left renal artery. No changes of fibromuscular dysplasia.  IMA: Patent and slightly atretic.  Inflow: Atherosclerotic vascular calcifications without aneurysmal dilatation or significant stenosis.  Proximal Outflow: Mild atherosclerotic calcifications in the common femoral arteries. The visualized superficial and profunda femoral arteries are patent.  Veins: New focal venous abnormality identified given the limitations of non venous phase timing.  NON-VASCULAR  Abdomen: Unremarkable CT appearance of the stomach, duodenum, spleen, and pancreas save for fatty atrophy. Similar appearance of a mild adreniform thickening of the adrenal glands bilaterally likely reflecting adrenal hyperplasia. Normal  hepatic contour and morphology. No discrete hepatic lesion. Gallbladder is surgically absent. No intra or extrahepatic  biliary ductal dilatation. 5.3 cm exophytic simple cyst from the upper pole of the right kidney. No hydronephrosis. No enhancing renal mass. 1.2 cm exophytic cyst from the interpolar left kidney.  Moderate volume of formed retained stool in the rectum consistent with constipation. Colonic diverticulosis without evidence of active diverticulitis. Normal appendix in the right lower quadrant. No free fluid or suspicious adenopathy.  Pelvis: Unremarkable appearance of the postmenopausal uterus and ovaries. Normal bladder. No free fluid or suspicious adenopathy.  Bones/Soft Tissues: Acute compression fracture of the superior endplate of V78. There is approximately 30% height loss. 3 mm of posterior bony retropulsion from the superior endplate. Chronic bilateral L4 pars defects with mild grade 1 anterolisthesis of L4 on L5.  Review of the MIP images confirms the above findings.  IMPRESSION: CTA CHEST  1. 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 The dissection is complex, in addition to the main true and false lumens there is a focal breech of the media medially with extravasation of an irregular pool of contrast material into the aortic wall. This occurs at the origin of a right intercostal bronchial trunk which opacifies with contrast material from the irregular intramural contrast collection. In this region, the aorta has markedly increased in size currently measuring 6.4 cm compared to 3.9 cm previously. The dissection flap does not extends into the abdominal aorta. 2. Cardiomegaly with left atrial enlargement. 3. Mitral valve replacement. 4. Atherosclerosis including coronary artery disease. 5. Marked enlargement of the main and central pulmonary arteries consistent with pulmonary arterial hypertension. 6. Pulmonary  interstitial edema. 7. COPD. CTA ABD/PELVIS  1. No acute abdominal aortic pathology. 2. Acute T12 compression fracture with approximately 30% height loss and 3 mm posterior bony retropulsion. 3. Mild fusiform aneurysmal dilatation of the infrarenal abdominal aorta with a maximal transverse diameter of 2.7 cm. 4. Colonic diverticulosis without evidence of active diverticulitis. 5. Chronic bilateral L4 pars defects with grade 1 anterolisthesis of L4 on L5. Critical Value/emergent results were called by telephone at the time of interpretation on 08/21/2013 at 12:45 AM to Dr. Betsey Holiday, who verbally acknowledged these results.  Electronically Signed: By: Jacqulynn Cadet M.D. On: 08/21/2013 00:48    Assessment/Plan 1 preoperative evaluation prior to repair of thoracic aortic aneurysm/dissection- patient would be considered high risk for any procedure given age, severe home O2 dependent COPD, pulmonary hypertension, history of hypertrophic obstructive cardiomyopathy, prior mitral valve replacement with elevated mean gradient. She does not have significant coronary disease. The fact that this is a vascular procedure increases her risk further. If surgery felt to be necessary would proceed without further cardiac evaluation understanding she would be high risk. 2 paroxysmal atrial fibrillation-the patient is in sinus rhythm on telemetry. Given aortic dissection would discontinue Coumadin. Continue amiodarone. 3 thoracic aortic dissection-management per vascular surgery. Patient needs blood pressure control. Resume Cozaar at previous dose. I have not added a beta blocker given baseline heart rate and first degree AV block. 4 hypertension-as outlined above resume Cozaar at 50 mg daily and increase as needed. 5 history of mitral valve replacement 6 hypertrophic cardiomyopathy  Kirk Ruths MD 08/21/2013, 10:19 AM

## 2013-08-22 DIAGNOSIS — I7101 Dissection of thoracic aorta: Secondary | ICD-10-CM

## 2013-08-22 DIAGNOSIS — Z5181 Encounter for therapeutic drug level monitoring: Secondary | ICD-10-CM

## 2013-08-22 DIAGNOSIS — Z7901 Long term (current) use of anticoagulants: Secondary | ICD-10-CM

## 2013-08-22 DIAGNOSIS — I71019 Dissection of thoracic aorta, unspecified: Secondary | ICD-10-CM

## 2013-08-22 DIAGNOSIS — J449 Chronic obstructive pulmonary disease, unspecified: Secondary | ICD-10-CM

## 2013-08-22 LAB — URINE CULTURE: Colony Count: 70000

## 2013-08-22 MED ORDER — ONDANSETRON HCL 4 MG/2ML IJ SOLN
4.0000 mg | Freq: Three times a day (TID) | INTRAMUSCULAR | Status: DC | PRN
  Administered 2013-08-22 – 2013-08-23 (×3): 4 mg via INTRAVENOUS
  Filled 2013-08-22 (×4): qty 2

## 2013-08-22 MED ORDER — PHYTONADIONE 5 MG PO TABS
5.0000 mg | ORAL_TABLET | Freq: Once | ORAL | Status: AC
  Administered 2013-08-22: 5 mg via ORAL
  Filled 2013-08-22: qty 1

## 2013-08-22 MED ORDER — ENSURE COMPLETE PO LIQD
237.0000 mL | Freq: Two times a day (BID) | ORAL | Status: DC
  Administered 2013-08-22 – 2013-08-25 (×6): 237 mL via ORAL

## 2013-08-22 MED ORDER — CLONIDINE HCL 0.1 MG PO TABS
0.1000 mg | ORAL_TABLET | Freq: Three times a day (TID) | ORAL | Status: DC
Start: 2013-08-22 — End: 2013-08-24
  Administered 2013-08-22 – 2013-08-24 (×7): 0.1 mg via ORAL
  Filled 2013-08-22 (×10): qty 1

## 2013-08-22 NOTE — Progress Notes (Signed)
Patient ID: Patricia [name], female   DOB: Jan 25, 1927, 80 y.o.   MRN: [id number] The patient remains hemodynamically stable. Denies any symptoms suggestive of her thoracic aneurysm. Discussed with the patient and daughter present this morning. Explain this is an incidental finding and certainly will be a difficult decision to recommend elective repair versus continued observation. He will see Dr. Myra GianottiBrabham in the office in one month for repeat CT scan to rule out any change in her dissection.

## 2013-08-22 NOTE — ED Provider Notes (Signed)
Medical screening examination/treatment/procedure(s) were conducted as a shared visit with non-physician practitioner(s) and myself.  I personally evaluated the patient during the encounter.   EKG Interpretation None      Mechanical fall on coumadin. CT head and c-spine showed no injury, but did reveal incidental thracic aortic dissection. Further evaluation with CTA - vascular consulted. No immediate intervention, admit to medicine and will follow.  Gilda Creasehristopher J. Pollina, MD 08/22/13 [phone]

## 2013-08-22 NOTE — Evaluation (Signed)
Physical Therapy Evaluation Patient Details Name: Patricia [name] MRN: [id number] DOB: 1926-08-31 Today's Date: 08/22/2013   History of Present Illness  80 y.o. female admitted to [facility] on 08/20/13 with a fall (her third fall in 2 weeks) wiyh T12 compression fx and TypeB aortic dissection that is being treated conservatively for now.  Pt with significant PMHx of CAD, s/p MVR, HTN, COPD (on 2 L O2 [place] 24/7 at home), A-fib, left wirst and right ankle ORIF.    Clinical Impression  Pt is slow moving, painful, nauseated and has significant recent history of falls.  I spoke at length with the patient and her daughters about discharge therapy options and her recent history and need for 24/7 assist at d/c for safety.  Everyone agrees that the pt's husband cannot provide the assistance as he is also unsteady on his feet and would likely himself fall trying to prevent her from falling.  Pt and family are interested in pursuing SNF level rehab at this time.  They were active at home with hospice RN to monitor her CHF (per family report).   PT to follow acutely for deficits listed below.     Follow Up Recommendations SNF    Equipment Recommendations  None recommended by PT    Recommendations for Other Services   None    Precautions / Restrictions Precautions Precautions: Fall Precaution Comments: recent h/o multiple falls Restrictions Weight Bearing Restrictions: No      Mobility     Transfers Overall transfer level: Needs assistance Equipment used: Rolling walker (2 wheeled) Transfers: Sit to/from Stand Sit to Stand: Min assist         General transfer comment: Min assist to support trunk during transitions to stand.  Pt using armrests on [facility] for support.    Ambulation/Gait Ambulation/Gait assistance: Min assist Ambulation Distance (Feet): 15 Feet Assistive device: Rolling walker (2 wheeled) Gait Pattern/deviations: Step-through pattern;Shuffle;Trunk flexed Gait velocity: decreased Gait  velocity interpretation: Below normal speed for age/gender General Gait Details: pt moving slowly, grimicing due to back pain.  Verbal cues to stay closer/inside of RW during gait. DOE with gait 2/4.           Balance Overall balance assessment: Needs assistance         Standing balance support: Bilateral upper extremity supported Standing balance-Leahy Scale: Poor                               Pertinent Vitals/Pain  08/22/13 1152  Vital Signs  Pulse Rate 67  BP ! 160/77 mmHg  BP Location Left arm  BP Method Automatic  Patient Position, if appropriate Sitting (reclined in recliner after working with PT)  Pain Assessment  Pain Assessment Faces  Faces Pain Scale 4  Pain Type Acute pain  Pain Location Back  Pain Orientation Mid  Pain Intervention(s) Repositioned;Ambulation/increased activity  Oxygen Therapy  SpO2 90 %  O2 Device Nasal cannula  O2 Flow Rate (L/min) 4 L/min       Home Living Family/patient expects to be discharged to:: Private residence Living Arrangements: Spouse/significant other (per family he is just as unsteady as she is) Available Help at Discharge: Family;Available 24 hours/day Type of Home: House Home Access: Stairs to enter Entrance Stairs-Rails: Right;Left;Can reach both Entrance Stairs-Number of Steps: 2 Home Layout: One level Home Equipment: Emergency planning/management officer - 4 wheels      Prior Function Level of Independence: Independent  Comments: h/o recent falls.  Pt also reports not eating well recently, poor appetitie and on/off nausea.          Extremity/Trunk Assessment   Upper Extremity Assessment: Generalized weakness           Lower Extremity Assessment: Generalized weakness      Cervical / Trunk Assessment: Kyphotic;Other exceptions  Communication   Communication: No difficulties  Cognition Arousal/Alertness: Awake/alert Behavior During Therapy: Flat affect Overall Cognitive Status: Within  Functional Limits for tasks assessed                      General Comments General comments (skin integrity, edema, etc.): Poor tolerance of activity due to DOE, nausea, and pain.            Assessment/Plan    PT Assessment Patient needs continued PT services  PT Diagnosis Difficulty walking;Abnormality of gait;Generalized weakness;Acute pain   PT Problem List Decreased strength;Decreased activity tolerance;Decreased mobility;Decreased balance;Decreased knowledge of use of DME;Decreased safety awareness;Decreased knowledge of precautions;Cardiopulmonary status limiting activity;Pain  PT Treatment Interventions DME instruction;Gait training;Stair training;Functional mobility training;Therapeutic activities;Balance training;Therapeutic exercise;Neuromuscular re-education;Patient/family education;Modalities   PT Goals (Current goals can be found in the Care Plan section) Acute Rehab PT Goals Patient Stated Goal: to stop falling, feel better PT Goal Formulation: With patient/family Time For Goal Achievement: 09/05/13 Potential to Achieve Goals: Fair    Frequency Min 3X/week   Barriers to discharge Decreased caregiver support husband is unsteady himself and she really needs 24 hour min assist.  I am afraid they would pull each other over.         End of Session Equipment Utilized During Treatment: Oxygen Activity Tolerance: Patient limited by fatigue;Patient limited by pain;Treatment limited secondary to medical complications (Comment) (by nausea/DOE) Patient left: in chair;with call bell/phone within reach;with family/visitor present (two daughters and son (in law?)) Nurse Communication: Mobility status         Time: 1137-1207 PT Time Calculation (min): 30 min   Charges:   PT Evaluation $Initial PT Evaluation Tier I: 1 Procedure PT Treatments $Therapeutic Activity: 8-22 mins        Bernie Ransford B. Atha Mcbain, PT, DPT [phone]   08/22/2013, 12:44 PM

## 2013-08-22 NOTE — Progress Notes (Addendum)
PULMONARY / CRITICAL CARE MEDICINE   Name: Patricia [name] MRN: [id number] DOB: 1926-12-24    ADMISSION DATE:  08/20/2013  REFERRING MD :  EDP  PRIMARY SERVICE: PCCM   CHIEF COMPLAINT:  S/P Fall on anticoagulation   BRIEF PATIENT DESCRIPTION: 80 y/o F with PMH of AFib on coumadin who was admitted 4/3 after mechanical fall striking her head and back.  Fall work up found interval development of Type B aortic dissection.  PCCM called for admission.    SIGNIFICANT EVENTS: 4/3 Admit after mechanical fall, found to have Type B aortic dissection   STUDIES:  4/3 CTA Chest, Abd / Pelvis >> Interval development (since 05/2011) Type B aortic dissection, complex with focal disruption into the aortic media & associated intramural contrast pool occurs from the medial aspect of the true lumen.   Marked interval increase in the size of the transverse & proximal descending thoracic aorta.  No abdominal aortic pathology.  Acute T12 compression fx      CULTURES: UA 4/3>>> UC  4/3>>>  ANTIBIOTICS:     SUBJECTIVE:   N and V p got up to use bsc with bloody stool "it's my hemorrhoids again" No chest or abd pain - also denies back pain    VITAL SIGNS: Temp:  [97.2 F (36.2 C)-98.5 F (36.9 C)] 97.2 F (36.2 C) (04/04 0550) Pulse Rate:  [62-68] 68 (04/04 0550) Resp:  [15-20] 20 (04/04 0550) BP: (139-166)/(56-76) 166/69 mmHg (04/04 0550) SpO2:  [92 %-96 %] 96 % (04/04 0550) Weight:  [159 lb 2.8 oz (72.2 kg)] 159 lb 2.8 oz (72.2 kg) (04/04 0100)  INTAKE / OUTPUT: Intake/Output     04/03 0701 - 04/04 0700 04/04 0701 - 04/05 0700   P.O. 360    Total Intake(mL/kg) 360 (5)    Urine (mL/kg/hr) 850 (0.5)    Total Output 850     Net -490          Urine Occurrence 2 x      PHYSICAL EXAMINATION: General:  wdwn elderly female in NAD more chronically than acutely ill appearing  Neuro:  AAOx4, speech clear, MAE HEENT:  Mm pink/moist, no jvd Cardiovascular:  s1s2 rrr, no m/r/g Lungs:  resp's  even/non-labored, lungs bilaterally clear Abdomen:  Round/soft, bsx4 active, non-tender to palpation  Musculoskeletal:  No acute deformities  Skin:  Warm/dry, no edema.  Dry shingles rash to anterior L chest and left side, no open vesicles / dry & crusty  LABS: ASSESSMENT / PLAN:  PULMONARY No results found for this basename: PHART, PCO2, PCO2ART, PO2, PO2ART, HCO3, TCO2, O2SAT,  in the last 168 hours  CBC  Recent Labs Lab 08/20/13 2114 08/21/13 0740  HGB 11.0* 10.0*  HCT 35.6* 32.3*  WBC 11.8* 9.8  PLT 168 147*    COAGULATION  Recent Labs Lab 08/20/13 2114  INR 2.05*    CARDIAC  No results found for this basename: TROPONINI,  in the last 168 hours No results found for this basename: PROBNP,  in the last 168 hours   CHEMISTRY  Recent Labs Lab 08/20/13 2114 08/21/13 0740  NA 138 138  K 4.5 4.4  CL 100 101  CO2 24 26  GLUCOSE 117* 103*  BUN 25* 20  CREATININE 0.82 0.72  CALCIUM 9.3 8.8   Estimated Creatinine Clearance: 51 ml/min (by C-G formula based on Cr of 0.72).   LIVER  Recent Labs Lab 08/20/13 2114  INR 2.05*        PULMONARY  A: COPD OSA - intermittent compliance with CPAP at home Oxygen Dependent  PAH P:   -oxygen to support sats > 93% -nocturnal CPAP -pulmonary hygiene -DNI  CARDIOVASCULAR A:  Type B Aortic Dissection  - interval development since 05/2013 Atrial Fibrillation  HTN Hypertrophic Cardiomyopathy  Right Heart Failure P:  -VVS consulted> poor candidate for elective repair > f/u one month as outpt rec -continue amiodarone  -reverse coumadin 4/4 due to BRBPR  - added clonidine 4/4 as cards wants to avoid BB but needs it if tolerated (can also slow HR/AVB but not as much as BB)   RENAL A:   At Risk AKI - in setting of contrast media P:   -monitor BMP, trend sr cr - d/c iv NS  4/4 as bp up     GASTROINTESTINAL A:   BRB PR  Not op candidate Nausea s pain  P:   - PPI/ fully reverse coumadin 4/4 - Zofran  IV prn   HEMATOLOGIC A:   Coagulopathy - in setting of coumadin administration P:  Reversed 4/4 as above    INFECTIOUS A:   R/O UTI - as source of falls in elderly (unikely with u/a clear 4/3) Shingles - recent outbreak on L chest wall, s/p valtrex  P:   -trend WBC / fever curve  ENDOCRINE A:   Hypothyroidism  Lab Results  Component Value Date   TSH 2.267 07/12/2013    P:   -continue synthroid  NEUROLOGIC A:     T12 Compression Fx, pain improved P:   -PRN percocet for pain      Sandrea Hughs, MD Pulmonary and Critical Care Medicine [facility] Cell [phone] After 5:30 PM or weekends, call [phone]

## 2013-08-22 NOTE — Progress Notes (Signed)
Removed 4 sutures from pts L fifth digit per MD request.

## 2013-08-23 DIAGNOSIS — S22009A Unspecified fracture of unspecified thoracic vertebra, initial encounter for closed fracture: Principal | ICD-10-CM

## 2013-08-23 DIAGNOSIS — I10 Essential (primary) hypertension: Secondary | ICD-10-CM

## 2013-08-23 LAB — CBC
HEMATOCRIT: 31.5 % — AB (ref 36.0–46.0)
Hemoglobin: 9.7 g/dL — ABNORMAL LOW (ref 12.0–15.0)
MCH: 24.1 pg — AB (ref 26.0–34.0)
MCHC: 30.8 g/dL (ref 30.0–36.0)
MCV: 78.2 fL (ref 78.0–100.0)
PLATELETS: 132 10*3/uL — AB (ref 150–400)
RBC: 4.03 MIL/uL (ref 3.87–5.11)
RDW: 19.8 % — ABNORMAL HIGH (ref 11.5–15.5)
WBC: 9.4 10*3/uL (ref 4.0–10.5)

## 2013-08-23 LAB — BASIC METABOLIC PANEL
BUN: 13 mg/dL (ref 6–23)
CO2: 27 meq/L (ref 19–32)
CREATININE: 0.61 mg/dL (ref 0.50–1.10)
Calcium: 8.7 mg/dL (ref 8.4–10.5)
Chloride: 98 mEq/L (ref 96–112)
GFR calc Af Amer: >90 mL/min (ref >90)
GFR calc non Af Amer: 81 mL/min — ABNORMAL LOW (ref >90)
Glucose, Bld: 127 mg/dL — ABNORMAL HIGH (ref 70–99)
Potassium: 4 mEq/L (ref 3.7–5.3)
Sodium: 136 mEq/L — ABNORMAL LOW (ref 137–147)

## 2013-08-23 MED ORDER — AMLODIPINE BESYLATE 5 MG PO TABS
5.0000 mg | ORAL_TABLET | Freq: Every day | ORAL | Status: DC
  Administered 2013-08-23 – 2013-08-24 (×2): 5 mg via ORAL
  Filled 2013-08-23 (×2): qty 1

## 2013-08-23 NOTE — Progress Notes (Signed)
Patient offered CPAP twice and both times patient refused CPAP. Patient on 4lpm with Sp02 level of 97% at this time. Will continue to monitor patient.

## 2013-08-23 NOTE — Progress Notes (Addendum)
PULMONARY / CRITICAL CARE MEDICINE   Name: Patricia [name] MRN: [id number] DOB: 05-26-1926    ADMISSION DATE:  08/20/2013  REFERRING MD :  EDP  PRIMARY SERVICE: PCCM   CHIEF COMPLAINT:  S/P Fall on anticoagulation   BRIEF PATIENT DESCRIPTION: 80 y/o F with PMH of AFib on coumadin who was admitted 4/3 after mechanical fall striking her head and back.  Fall work up found interval development of Type B aortic dissection.  PCCM called for admission.    SIGNIFICANT EVENTS: 4/3 Admit after mechanical fall, found to have Type B aortic dissection   STUDIES:  4/3 CTA Chest, Abd / Pelvis >> Interval development (since 05/2011) Type B aortic dissection, complex with focal disruption into the aortic media & associated intramural contrast pool occurs from the medial aspect of the true lumen.   Marked interval increase in the size of the transverse & proximal descending thoracic aorta.  No abdominal aortic pathology.  Acute T12 compression fx      CULTURES:   UC  4/3> multiple species  ANTIBIOTICS:     SUBJECTIVE:  Nausea wax and wane x 2 weeks but in retrospect present x 2 y   VITAL SIGNS: Temp:  [97.8 F (36.6 C)-98.5 F (36.9 C)] 97.8 F (36.6 C) (04/05 0508) Pulse Rate:  [56-67] 60 (04/05 0508) Resp:  [18-20] 20 (04/05 0508) BP: (105-160)/(62-78) 159/78 mmHg (04/05 0508) SpO2:  [90 %-96 %] 94 % (04/05 0508) Weight:  [154 lb 5.2 oz (70 kg)] 154 lb 5.2 oz (70 kg) (04/05 0508)  INTAKE / OUTPUT: Intake/Output     04/04 0701 - 04/05 0700 04/05 0701 - 04/06 0700   P.O.     I.V. (mL/kg) 360.6 (5.2)    Total Intake(mL/kg) 360.6 (5.2)    Urine (mL/kg/hr)     Total Output       Net +360.6          Urine Occurrence 1 x    Stool Occurrence 1 x      PHYSICAL EXAMINATION: General:  wdwn elderly female in NAD more chronically than acutely ill appearing  Neuro:  AAOx4, speech clear, MAE HEENT:  Mm pink/moist, no jvd Cardiovascular:  s1s2 rrr, no m/r/g Lungs:  resp's  even/non-labored, lungs bilaterally clear Abdomen:  Round/soft, bsx4 active, non-tender to palpation  Musculoskeletal:  No acute deformities  Skin:  Warm/dry, no edema.  Dry shingles rash to anterior L chest and left side, no open vesicles / dry & crusty  LABS: ASSESSMENT / PLAN:  PULMONARY No results found for this basename: PHART, PCO2, PCO2ART, PO2, PO2ART, HCO3, TCO2, O2SAT,  in the last 168 hours  CBC  Recent Labs Lab 08/20/13 2114 08/21/13 0740  HGB 11.0* 10.0*  HCT 35.6* 32.3*  WBC 11.8* 9.8  PLT 168 147*    COAGULATION  Recent Labs Lab 08/20/13 2114  INR 2.05*    CARDIAC  No results found for this basename: TROPONINI,  in the last 168 hours No results found for this basename: PROBNP,  in the last 168 hours   CHEMISTRY  Recent Labs Lab 08/20/13 2114 08/21/13 0740  NA 138 138  K 4.5 4.4  CL 100 101  CO2 24 26  GLUCOSE 117* 103*  BUN 25* 20  CREATININE 0.82 0.72  CALCIUM 9.3 8.8   Estimated Creatinine Clearance: 50.2 ml/min (by C-G formula based on Cr of 0.72).   LIVER  Recent Labs Lab 08/20/13 2114  INR 2.05*  PULMONARY A: COPD OSA - intermittent compliance with CPAP at home Oxygen Dependent  PAH P:   -oxygen to support sats > 93% -nocturnal CPAP -pulmonary hygiene -DNI  CARDIOVASCULAR A:  Type B Aortic Dissection  - interval development since 06/07/2011 CT Atrial Fibrillation  HTN Hypertrophic Cardiomyopathy  Right Heart Failure P:  -VVS consulted> poor candidate for elective repair > f/u one month as outpt rec -continue amiodarone  -reversed coumadin 4/4 due to BRBPR  - added clonidine 4/4 as cards wants to avoid BB but needs it if tolerated (can also slow HR/AVB but not as much as BB)  - next step add vasodilator = amlodipine 4/5   RENAL A:   At Risk AKI - in setting of contrast media P:   -monitor BMP, trend sr cr - d/cd iv NS  4/4 as bp up     GASTROINTESTINAL A:   BRB PR  Not op candidate Nausea s  pain  P:   - PPI/ fully reversed coumadin 4/4 - Zofran IV prn   HEMATOLOGIC A:   Coagulopathy - in setting of coumadin administration P:  Reversed 4/4 as above    INFECTIOUS A:   R/O UTI - as source of falls in elderly (unikely with u/a clear 4/3> see dashboard above) Shingles - recent outbreak on L chest wall, s/p valtrex  P:   -trend WBC / fever curve  ENDOCRINE A:   Hypothyroidism  Lab Results  Component Value Date   TSH 2.267 07/12/2013    P:   -continue synthroid  NEUROLOGIC A:     T12 Compression Fx, pain improved P:   -PRN percocet for pain     Discussed with daughter at bedside, greatest concern now is variable nausea Controlled with prn Zofran but present for ? Sev years?   Sandrea HughsMichael Geryl Dohn, MD Pulmonary and Critical Care Medicine [facility] Cell [phone] After 5:30 PM or weekends, call [phone]

## 2013-08-23 NOTE — Progress Notes (Signed)
Resp Care Note;Spoke with pt about Cpap,she states she does'nt want to wear one of our Cpap machines.

## 2013-08-24 ENCOUNTER — Other Ambulatory Visit: Payer: Self-pay | Admitting: *Deleted

## 2013-08-24 DIAGNOSIS — W19XXXA Unspecified fall, initial encounter: Secondary | ICD-10-CM | POA: Diagnosis present

## 2013-08-24 DIAGNOSIS — I7100 Dissection of unspecified site of aorta: Secondary | ICD-10-CM

## 2013-08-24 DIAGNOSIS — Z9181 History of falling: Secondary | ICD-10-CM

## 2013-08-24 DIAGNOSIS — I509 Heart failure, unspecified: Secondary | ICD-10-CM

## 2013-08-24 DIAGNOSIS — G4733 Obstructive sleep apnea (adult) (pediatric): Secondary | ICD-10-CM

## 2013-08-24 DIAGNOSIS — N179 Acute kidney failure, unspecified: Secondary | ICD-10-CM

## 2013-08-24 DIAGNOSIS — Z66 Do not resuscitate: Secondary | ICD-10-CM | POA: Diagnosis present

## 2013-08-24 DIAGNOSIS — I2789 Other specified pulmonary heart diseases: Secondary | ICD-10-CM

## 2013-08-24 DIAGNOSIS — E43 Unspecified severe protein-calorie malnutrition: Secondary | ICD-10-CM

## 2013-08-24 DIAGNOSIS — J9620 Acute and chronic respiratory failure, unspecified whether with hypoxia or hypercapnia: Secondary | ICD-10-CM | POA: Diagnosis present

## 2013-08-24 DIAGNOSIS — J9610 Chronic respiratory failure, unspecified whether with hypoxia or hypercapnia: Secondary | ICD-10-CM

## 2013-08-24 DIAGNOSIS — I5033 Acute on chronic diastolic (congestive) heart failure: Secondary | ICD-10-CM

## 2013-08-24 MED ORDER — FUROSEMIDE 20 MG PO TABS
20.0000 mg | ORAL_TABLET | ORAL | Status: DC
  Administered 2013-08-24: 20 mg via ORAL
  Filled 2013-08-24 (×2): qty 1

## 2013-08-24 NOTE — Clinical Documentation Improvement (Signed)
Possible Clinical Conditions? Chronic Respiratory Failure Acute Respiratory Insufficiency Other Condition Supporting Information: Risk Factors:OSA, COPD, CHF Signs & Symptoms:oxygen dependent per progress notes  Diagnostics: 89% on 2 l/m n/c; increased to 4 l/m via n/c  Treatment:oxygen to support sats > 93%  Thank You, Patricia [name] ,RN Clinical Documentation Specialist:  [phone] [facility]

## 2013-08-24 NOTE — Progress Notes (Addendum)
Notified MD of pt blood pressure of 90/44. Orders received. Will continue to monitor.

## 2013-08-24 NOTE — Discharge Summary (Addendum)
Physician Discharge Summary  Patient ID: [name] MRN: [id number] DOB/AGE: 1927/04/24 80 y.o.  Admit date: 08/20/2013 Discharge date: 08/25/2013    Discharge Diagnoses:  Chronic Respiratory Failure Acute Respiratory Insufficiency COPD OSA Oxygen Dependence PAH Type B Aortic Dissection  Atrial Fibrillation  HTN  Hypertrophic Cardiomyopathy  Right Heart Failure BRB PR Not op candidate  Nausea Coagulopathy R/O UTI  Shingles  Hypothyroidism T12 Compression Fx Pain Deconditioning                                                                        DISCHARGE PLAN BY DIAGNOSIS     Chronic Respiratory Failure Acute Respiratory Insufficiency COPD OSA Oxygen Dependence PAH - PAS 60 06/2013  Discharge Plan: -continue oxygen 2-4 L with activity -Nocturnal CPAP >pt has not tolerated this very well in the past -continue PT efforts as tolerated -continue DNR/DNI, otherwise full medical care  Type B Aortic Dissection  Atrial Fibrillation  HTN  Hypertrophic Cardiomyopathy  Right Heart Failure  Discharge Plan: -HOLD Norvasc & clonidine post discharge as low normal BP's prior to d/c -Follow up with Cardiology for post-hospital & BP review -continue Amiodarone -No further Coumadin as high fall risk -continue M/W/F Lasix 20 mg -follow up with Dr. Trula Slade for review of dissection as scheduled  BRB PR Not op candidate  Nausea Coagulopathy  Discharge Plan: -No further coumadin as above -no further bleeding / resolved  Shingles   Discharge Plan: -UTI ruled out -shingles resolved  Hypothyroidism  Discharge Plan: -continue synthroid  T12 Compression Fx Pain Deconditioning   Discharge Plan: -continue percocet for pain                  DISCHARGE SUMMARY   Patricia [name] is a 80 y.o. y/o female with a PMH of OSA (non-compliant with CPAP), COPD on O2 2L (she wears "when she needs it" / non-compliant), PAH, hypothyroidism, Anemia, CAD, HTN, AS, s/p MVR,  Hypertrophic Cardiomyopathy who presented 4/3 after mechanical fall striking her head and back. She reports she was getting into the lift recliner and fell backward onto her buttock striking her back and head on the door as she fell in the seated position. Patient relays a second fall one week prior to admit while walking up the sidewalk with similar circumstances - "I was down before I knew it and could not stop myself". Patient remembers the details of both falls and denies syncope or pre-syncopal symptoms. On admit, she endorsed low back without radicular symptoms / hip pain. She is followed by palliative care services at home and is DNR / DNI but otherwise full scope medical care.   ER work up included CTA of chest, abd / pelvis with an interval development (since 05/2011) Type B aortic dissection, complex with focal disruption into the aortic media & associated intramural contrast pool occurs from the medial aspect of the true lumen. Marked interval increase in the size of the transverse & proximal descending thoracic aorta. No abdominal aortic pathology. Acute T12 compression fx.  Patient was admitted to SDU to be monitored from he standpoint. VVS consulted for surgical evaluation and she was felt to be a poor surgical candidate. Hospital course complicated by bright red blood per  rectum (4/4) and mild hypotension on 4/6 pm.  Coumadin was reversed and decision was made not to continue in the setting of high fall risk.  Clonidine & Norvasc held at time of discharge in setting of low normal BP.  See discharge plan as above.        SIGNIFICANT EVENTS:  4/3 Admit after mechanical fall, found to have Type B aortic dissection   STUDIES:  4/3 CTA Chest, Abd / Pelvis >> Interval development (since 05/2011) Type B aortic dissection, complex with focal disruption into the aortic media & associated intramural contrast pool occurs from the medial aspect of the true lumen. Marked interval increase in the size of  the transverse & proximal descending thoracic aorta. No abdominal aortic pathology. Acute T12 compression fx   CULTURES:  UC 4/3> multiple species  Discharge Exam: General: wdwn elderly female in NAD more chronically than acutely ill appearing  Neuro: AAOx4, speech clear, MAE  HEENT: Mm pink/moist, no jvd  Cardiovascular: s1s2 rrr, no m/r/g  Lungs: resp's even/non-labored, basilar rales  Abdomen: Round/soft, bsx4 active, non-tender to palpation  Musculoskeletal: No acute deformities  Skin: Warm/dry, no edema. Dry shingles rash to anterior L chest and left side, no open vesicles / dry & crusty    Filed Vitals:   08/24/13 2146 08/25/13 0521 08/25/13 0908 08/25/13 1400  BP: 133/56 146/58 106/61 128/79  Pulse: 79 60 100 100  Temp: 98.5 F (36.9 C) 97.7 F (36.5 C) 99.2 F (37.3 C) 98.2 F (36.8 C)  TempSrc: Oral Oral Oral Oral  Resp: _0 Height:      Weight:  72.4 kg (159 lb 9.8 oz)    SpO2: 99% 98% 99% 96%    Discharge Labs  BMET  Recent Labs Lab 08/20/13 2114 08/21/13 0740 08/23/13 1010 08/25/13 0700  NA 138 138 136* 139  K 4.5 4.4 4.0 5.3  CL 100 101 98 100  CO2 _1 GLUCOSE 117* 103* 127* 97  BUN 25* _2 CREATININE 0.82 0.72 0.61 0.81  CALCIUM 9.3 8.8 8.7 8.8   CBC  Recent Labs Lab 08/20/13 2114 08/21/13 0740 08/23/13 1010  HGB 11.0* 10.0* 9.7*  HCT 35.6* 32.3* 31.5*  WBC 11.8* 9.8 9.4  PLT 168 147* 132*   Anti-Coagulation  Recent Labs Lab 08/20/13 2114  INR 2.05*    Discharge Orders   Future Appointments Provider Department Dept Phone   09/02/2013 11:15 AM Imogene Burn, PA-C [place] [phone]   09/10/2013 3:45 PM Collene Gobble, MD [facility] [phone]   09/22/2013 3:00 PM Rosetta Posner, MD [facility] [place] [phone]   Future Orders Complete By Expires   Call MD for:  difficulty breathing, headache or visual disturbances  As directed    Call MD for:   extreme fatigue  As directed    Call MD for:  persistant dizziness or light-headedness  As directed    Call MD for:  persistant nausea and vomiting  As directed    Call MD for:  severe uncontrolled pain  As directed    Call MD for:  temperature >100.4  As directed    Diet - low sodium heart healthy  As directed    Discharge instructions  As directed    Comments:     1. Oxygen 2-4L with activity & PRN 2. Follow up as above   Increase activity slowly  As directed  Follow-up Information   Follow up with Ermalinda Barrios, PA-C On 09/02/2013. (11:15 AM)    Specialty:  Cardiology   Contact information:   [address] [place] [address] [phone]       Follow up with EARLY, TODD, MD On 09/22/2013. (Appt at 3:00 PM --[facility])    Specialty:  Vascular Surgery   Contact information:   [address] [place] [address] [phone]       Follow up with Collene Gobble., MD On 09/10/2013. (Appt at 3:45 )    Specialty:  Pulmonary Disease   Contact information:   [address] [address] [phone]        Medication List    STOP taking these medications       losartan 100 MG tablet  Commonly known as:  COZAAR     traMADol 50 MG tablet  Commonly known as:  ULTRAM     warfarin 1 MG tablet  Commonly known as:  COUMADIN     warfarin 5 MG tablet  Commonly known as:  COUMADIN      TAKE these medications       amiodarone 200 MG tablet  Commonly known as:  PACERONE  Take 100 mg by mouth daily.     calcitonin (salmon) 200 UNIT/ACT nasal spray  Commonly known as:  MIACALCIN/FORTICAL  Place 1 spray into alternate nostrils daily.     CALCIUM 500 + D PO  Take 1 tablet by mouth daily.     cholecalciferol 1000 UNITS tablet  Commonly known as:  VITAMIN D  Take 1,000 Units by mouth at bedtime.     COQ10 PO  Take 300 mg by mouth daily.     feeding supplement (ENSURE COMPLETE) Liqd  Take 237 mLs by mouth 2  (two) times daily between meals.     furosemide 20 MG tablet  Commonly known as:  LASIX  Take 20 mg by mouth 3 (three) times a week. Monday, Wednesday and friday     levothyroxine 125 MCG tablet  Commonly known as:  SYNTHROID, LEVOTHROID  Take 62.5 mcg by mouth daily before breakfast.     lidocaine 2 % jelly  Commonly known as:  XYLOCAINE  Apply 1 application topically daily as needed (pain).     nitroGLYCERIN 0.4 MG SL tablet  Commonly known as:  NITROSTAT  Place 0.4 mg under the tongue every 5 (five) minutes as needed for chest pain.     OVER THE COUNTER MEDICATION  Apply 1 application topically daily as needed (pain). Shingles rescue     oxyCODONE-acetaminophen 5-325 MG per tablet  Commonly known as:  PERCOCET/ROXICET  Take 1 tablet by mouth every 6 (six) hours as needed for moderate pain or severe pain.     PROBIOTIC DAILY PO  Take 1 tablet by mouth daily.     sertraline 100 MG tablet  Commonly known as:  ZOLOFT  Take 100 mg by mouth daily.     TH FLAX SEED OIL PO  Take 1 capsule by mouth daily.     valACYclovir 1000 MG tablet  Commonly known as:  VALTREX  Take 1 tablet (1,000 mg total) by mouth 3 (three) times daily.       Disposition: SNF for rehab efforts.  Hold home palliative efforts while inpatient at SNF, can resume upon discharge.   Discharged Condition: Patricia [name] has met maximum benefit of inpatient care and is medically stable and  cleared for discharge.  Patient is pending follow up as above.      Time spent on disposition:  Greater than 35 minutes.   Signed: Noe Gens, NP-C [place] [facility] Pgr: [phone] Office: [phone]

## 2013-08-24 NOTE — Progress Notes (Signed)
INITIAL NUTRITION ASSESSMENT  DOCUMENTATION CODES Per approved criteria  -Severe malnutrition in the context of chronic illness   INTERVENTION: Continue Ensure Complete po BID, each supplement provides 350 kcal and 13 grams of protein. RD to continue to follow nutrition care plan.  NUTRITION DIAGNOSIS: Inadequate oral intake related to limited appetite as evidenced by patient report.   Goal: Intake to meet >90% of estimated nutrition needs.  Monitor:  weight trends, lab trends, I/O's, PO intake, supplement tolerance  Reason for Assessment: Malnutrition Screening Tool  80 y.o. female  Admitting Dx: Dissecting aneurysm of descending thoracic aorta  ASSESSMENT: PMHx significant for OSA, COPD, PAH, hypothyroidism, anemia, CAD, HTN. Admitted s/p mechanical fall. Work-up reveals Type B aortic dissection, COPD and shingles.  She is followed by palliative care services at home and is DNR / DNI but otherwise full scope medical care.   Currently ordered for a Heart Healthy diet, pt with variable appetite at meal times. Per daughter at bedside, pt will eat 25-50% of meals. Ate all of her breakfast this morning, pt consumed a bowl of cheerios. Pt reports that her intake has been diminished for about 2 years. Pt eats about 2 meals daily at home - breakfast around noon and dinner around 5-6pm.   Ordered for Ensure Complete and drinking about 1 daily.  Nutrition Focused Physical Exam:  Subcutaneous Fat:  Orbital Region: WNL Upper Arm Region: moderate depletion Thoracic and Lumbar Region: n/a  Muscle:  Temple Region: moderate depletion Clavicle Bone Region: moderate depletion Clavicle and Acromion Bone Region: moderate depletion Scapular Bone Region: WNL Dorsal Hand: severe depletion Patellar Region: moderate depletion Anterior Thigh Region: moderate depletion Posterior Calf Region: moderate depletion  Edema: none  Pt meets criteria for severe MALNUTRITION in the context of  chronic illness as evidenced by 8.5% x 5 months, intake of <75% x at least 1 month.  Height: Ht Readings from Last 1 Encounters:  08/20/13 5\' 4"  (1.626 m)    Weight: Wt Readings from Last 1 Encounters:  08/24/13 156 lb 1.6 oz (70.806 kg)    Ideal Body Weight: 120 lb  % Ideal Body Weight: 130%  Wt Readings from Last 10 Encounters:  08/24/13 156 lb 1.6 oz (70.806 kg)  08/15/13 158 lb (71.668 kg)  08/10/13 153 lb (69.4 kg)  08/08/13 160 lb (72.576 kg)  07/22/13 168 lb (76.204 kg)  07/14/13 164 lb 3.9 oz (74.5 kg)  04/09/13 171 lb (77.565 kg)  03/27/13 173 lb 14.4 oz (78.881 kg)  01/07/13 183 lb 6.4 oz (83.19 kg)  11/28/12 176 lb (79.833 kg)    Usual Body Weight: 184 lb (per patient)  % Usual Body Weight: 85%  BMI:  Body mass index is 26.78 kg/(m^2). Overweight  Estimated Nutritional Needs: Kcal: 1300 - 1500 Protein: 80-90 g Fluid: approx 1.5 liters daily  Skin: intact  Diet Order: Cardiac  EDUCATION NEEDS: -No education needs identified at this time   Intake/Output Summary (Last 24 hours) at 08/24/13 0921 Last data filed at 08/23/13 1900  Gross per 24 hour  Intake    241 ml  Output      0 ml  Net    241 ml    Last BM: 4/5  Labs:   Recent Labs Lab 08/20/13 2114 08/21/13 0740 08/23/13 1010  NA 138 138 136*  K 4.5 4.4 4.0  CL 100 101 98  CO2 24 26 27   BUN 25* 20 13  CREATININE 0.82 0.72 0.61  CALCIUM 9.3 8.8 8.7  GLUCOSE 117* 103* 127*    CBG (last 3)  No results found for this basename: GLUCAP,  in the last 72 hours  Scheduled Meds: . amiodarone  100 mg Oral Daily  . amLODipine  5 mg Oral Daily  . cloNIDine  0.1 mg Oral TID  . feeding supplement (ENSURE COMPLETE)  237 mL Oral BID BM  . furosemide  20 mg Oral Once per day on Mon Wed Fri  . levothyroxine  62.5 mcg Oral QAC breakfast  . losartan  50 mg Oral Daily  . saccharomyces boulardii  250 mg Oral Daily  . sertraline  100 mg Oral Daily    Continuous Infusions: . sodium chloride  10 mL/hr (08/22/13 2254)    Past Medical History  Diagnosis Date  . CAD (coronary artery disease)   . Mitral valve disorder     a. s/p MVR;  b. 06/2013 Echo: EF 55-60%, no rwma, Ao sclerosis, mild AI, mild MR, miod dil LA, sev dil RV with reduced fxn, sev dil RA, mod TR/PR, PASP 60mmHg (study reviewed by Dr. Verlin FesterKN- MS felt to be sev, peak grad 39/mean 13).  [name]. HTN (hypertension)   . Dyslipidemia   . Aortic stenosis, mild   . Breast cyst   . Hypertrophic cardiomyopathy   . Sleep apnea   . COPD (chronic obstructive pulmonary disease)     "a touch of"; prn o2 at home with activity   . Atrial fibrillation     a. chronic coumadin.  [name]. Hypothyroidism   . Anemia   . Chronic right-sided heart failure     Past Surgical History  Procedure Laterality Date  . Laparoscopic cholecystectomy  2009  . Cataract extraction  2003    bilateral  . Mitral valve replacement  2007  . Neck mass excision  2003    benign  . Colonoscopy    . Broken wrist      left, metal implanted   . Broken ankle      right, metal implanted   . Cardiac catheterization  06/2011  . Breast surgery      bilateral cysts x3 removed, benign   . Tee without cardioversion  01/31/2012    Procedure: TRANSESOPHAGEAL ECHOCARDIOGRAM (TEE);  Surgeon: Pricilla RifflePaula V Ross, MD;  [facility];  Service: Cardiovascular;  Laterality: N/A;  . Cardioversion N/A 01/16/2013    Procedure: CARDIOVERSION;  Surgeon: Vesta MixerPhilip J Nahser, MD;  [facility];  Service: Cardiovascular;  Laterality: N/A;    Jarold MottoSamantha Melisssa Donner MS, RD, LDN Inpatient Registered Dietitian Pager: [phone] After-hours pager: [phone]

## 2013-08-24 NOTE — Progress Notes (Addendum)
PULMONARY / CRITICAL CARE MEDICINE   Name: Patricia [name] MRN: [id number] DOB: 12-25-1926    ADMISSION DATE:  08/20/2013  REFERRING MD :  EDP  PRIMARY SERVICE: PCCM   CHIEF COMPLAINT:  S/P Fall on anticoagulation   BRIEF PATIENT DESCRIPTION: 80 y/o F with PMH of AFib on coumadin who was admitted 4/3 after mechanical fall striking her head and back.  Fall work up found interval development of Type B aortic dissection.  PCCM called for admission.    SIGNIFICANT EVENTS: 4/3 Admit after mechanical fall, found to have Type B aortic dissection   STUDIES:  4/3 CTA Chest, Abd / Pelvis >> Interval development (since 05/2011) Type B aortic dissection, complex with focal disruption into the aortic media & associated intramural contrast pool occurs from the medial aspect of the true lumen.   Marked interval increase in the size of the transverse & proximal descending thoracic aorta.  No abdominal aortic pathology.  Acute T12 compression fx   CULTURES:   UC  4/3> multiple species  ANTIBIOTICS:     SUBJECTIVE:  Less nausea   VITAL SIGNS: Temp:  [97.6 F (36.4 C)-97.7 F (36.5 C)] 97.6 F (36.4 C) (04/06 0500) Pulse Rate:  [59-65] 65 (04/06 0500) Resp:  [19-20] 19 (04/06 0500) BP: (119-133)/(65-72) 126/65 mmHg (04/06 0500) SpO2:  [97 %] 97 % (04/06 0500) Weight:  [70.806 kg (156 lb 1.6 oz)] 70.806 kg (156 lb 1.6 oz) (04/06 0446)  INTAKE / OUTPUT: Intake/Output     04/05 0701 - 04/06 0700 04/06 0701 - 04/07 0700   I.V. (mL/kg) 241 (3.4)    Total Intake(mL/kg) 241 (3.4)    Net +241            PHYSICAL EXAMINATION: General:  wdwn elderly female in NAD more chronically than acutely ill appearing  Neuro:  AAOx4, speech clear, MAE HEENT:  Mm pink/moist, no jvd Cardiovascular:  s1s2 rrr, no m/r/g Lungs:  resp's even/non-labored, basilar rales Abdomen:  Round/soft, bsx4 active, non-tender to palpation  Musculoskeletal:  No acute deformities  Skin:  Warm/dry, no edema.  Dry shingles  rash to anterior L chest and left side, no open vesicles / dry & crusty  LABS: ASSESSMENT / PLAN:  PULMONARY No results found for this basename: PHART, PCO2, PCO2ART, PO2, PO2ART, HCO3, TCO2, O2SAT,  in the last 168 hours  CBC  Recent Labs Lab 08/20/13 2114 08/21/13 0740 08/23/13 1010  HGB 11.0* 10.0* 9.7*  HCT 35.6* 32.3* 31.5*  WBC 11.8* 9.8 9.4  PLT 168 147* 132*    COAGULATION  Recent Labs Lab 08/20/13 2114  INR 2.05*    CARDIAC  No results found for this basename: TROPONINI,  in the last 168 hours No results found for this basename: PROBNP,  in the last 168 hours   CHEMISTRY  Recent Labs Lab 08/20/13 2114 08/21/13 0740 08/23/13 1010  NA 138 138 136*  K 4.5 4.4 4.0  CL 100 101 98  CO2 24 26 27   GLUCOSE 117* 103* 127*  BUN 25* 20 13  CREATININE 0.82 0.72 0.61  CALCIUM 9.3 8.8 8.7   Estimated Creatinine Clearance: 50.5 ml/min (by C-G formula based on Cr of 0.61).   LIVER  Recent Labs Lab 08/20/13 2114  INR 2.05*    PULMONARY A: Chronic Respiratory Failure  Acute Respiratory Insufficiency  COPD OSA - intermittent compliance with CPAP at home Oxygen Dependent  PAH P:   -oxygen to support sats > 93% -nocturnal CPAP -pulmonary hygiene -DNI  DNR -pt has home hospice (community hospice) -pt needs SNF Rehab placement  CARDIOVASCULAR A:  Type B Aortic Dissection  - interval development since 06/07/2011 CT Atrial Fibrillation  HTN Hypertrophic Cardiomyopathy  Right Heart Failure P:  -VVS consulted> poor candidate for elective repair > f/u one month as outpt rec -continue amiodarone  -reversed coumadin 4/4 due to BRBPR>>NO FURTHER VKA   - added clonidine 4/4 as cards wants to avoid BB but needs it if tolerated (can also slow HR/AVB but not as much as BB)  - next step add vasodilator = amlodipine 4/5  -add back lasix   RENAL A:   At Risk AKI - in setting of contrast media P:   -monitor BMP, trend sr cr - -resume lasix    GASTROINTESTINAL A:   BRB PR  Not op candidate Nausea s pain >>improved P:   - PPI/ fully reversed coumadin 4/4 - Zofran IV prn   HEMATOLOGIC A:   Coagulopathy - in setting of coumadin administration P:  Reversed 4/4 as above No further coumadin    INFECTIOUS A:   R/O UTI - as source of falls in elderly (unikely with u/a clear 4/3> see dashboard above) Shingles - recent outbreak on L chest wall, s/p valtrex  P:   -trend WBC / fever curve  ENDOCRINE A:   Hypothyroidism  Lab Results  Component Value Date   TSH 2.267 07/12/2013    P:   -continue synthroid  NEUROLOGIC A:     T12 Compression Fx, pain improved P:   -PRN percocet for pain and d/c tramadol    Plan SNF transfer for rehab.  Pt prefers [facility]   Dorcas Carrowatrick WrightMD Beeper  [phone]  Cell  [phone]  If no response or cell goes to voicemail, call beeper [phone] 08/24/2013 9:14 AM

## 2013-08-24 NOTE — Clinical Social Work Psychosocial (Signed)
Clinical Social Work Department BRIEF PSYCHOSOCIAL ASSESSMENT 08/24/2013  Patient:  Patricia [name], Patricia [name]     Account Number:  [id number]     Admit date:  08/20/2013  Clinical Social Worker:  Lovey Newcomer  Date/Time:  08/24/2013 01:30 PM  Referred by:  Physician  Date Referred:  08/24/2013 Referred for  SNF Placement   Other Referral:   Interview type:  Patient Other interview type:   Patient and family (two daughters and husband) interviewed at bedside to complete assessment.    PSYCHOSOCIAL DATA Living Status:  HUSBAND Admitted from facility:   Level of care:   Primary support name:  Charles Primary support relationship to patient:  SPOUSE Degree of support available:   Support is strong. Patient has several family members at bedside.    CURRENT CONCERNS Current Concerns  Post-Acute Placement   Other Concerns:    SOCIAL WORK ASSESSMENT / PLAN CSW met with patient at bedside to complete assessment. CSW explained that PT has recommended that patient go to SNF for rehab prior to returning home. Patient and family are in agreement with this recommendation. Patient states that she would like to go to [facility] if possible. CSW explained search/placement process and explained that patient would need to go to a SNF that contracts with her insurance unless she wanted to pay privately and that her insurance would have to authorize SNF if she is going to use her SNF benefits.    Patient states that she is from home with her husband Patricia [name] who is at bedside. Patient introduced CSW to her two daughters at bedside. CSW inquired about patient's feelings of the recommendation and going to SNF. Patient is indifferent about SNF and the recommendation doesn't bother her. She states, "If it's something I need to do then I'm going to do it." CSW inquired about any questions that patient and family had and answered them appropriately.   Assessment/plan status:  Psychosocial  Support/Ongoing Assessment of Needs Other assessment/ plan:   Complete FL2, Fax, PASRR   Information/referral to community resources:   CSW contact information and SNF list given to patient.    PATIENT'S/FAMILY'S RESPONSE TO PLAN OF CARE: Patient is agreeable to SNF placement at DC and has a preference for [facility]. Patient and family were pleasant and engaged in assessment. Patient and family were appreciative of CSW's assistance. CSW will follow up with bed offers and assist with SNF placement when appropriate.       Liz Beach MSW, [place], [place], [phone]

## 2013-08-24 NOTE — Clinical Documentation Improvement (Signed)
Possible Clinical Conditions? Severe Malnutrition   Severe Protein Calorie Malnutrition Other Condition Supporting Information: INITIAL NUTRITION ASSESSMENT   DOCUMENTATION CODES  Per approved criteria   -Severe malnutrition in the context of chronic illness    Per daughter at bedside, pt will eat 25-50% of meals. Ate all of her breakfast this morning, pt consumed a bowl of cheerios. Pt reports that her intake has been diminished for about 2 years. Pt eats about 2 meals daily at home - breakfast around noon and dinner around 5-6pm.  Ordered for Ensure Complete and drinking about 1 daily.  Nutrition Focused Physical Exam:  Subcutaneous Fat:  Upper Arm Region: moderate depletion  Muscle:  Temple Region: moderate depletion  Clavicle Bone Region: moderate depletion  Clavicle and Acromion Bone Region: moderate depletion  Dorsal Hand: severe depletion  Patellar Region: moderate depletion  Anterior Thigh Region: moderate depletion  Posterior Calf Region: moderate depletion  NUTRITION DIAGNOSIS: Inadequate oral intake related to limited appetite as evidenced by patient report.DOCUMENTATION CODES Per approved criteria -Severe malnutrition in the context of chronic illness; Treatment:INTERVENTION: Continue Ensure Complete po BID, each supplement provides 350 kcal and 13 grams of protein. RD to continue to follow nutrition care plan. Monitor:  weight trends, lab trends, I/O's, PO intake, supplement tolerance Thank You, Amada Kingfisherebra J Hayes ,RN Clinical Documentation Specialist:  [phone] [facility] Information Management

## 2013-08-24 NOTE — Progress Notes (Signed)
Subjective: No Orthopnea.  Some back pain when she moves.  Objective: Vital signs in last 24 hours: Temp:  [97.6 F (36.4 C)-97.7 F (36.5 C)] 97.6 F (36.4 C) (04/06 0500) Pulse Rate:  [59-65] 65 (04/06 0500) Resp:  [19-20] 19 (04/06 0500) BP: (119-133)/(65-72) 126/65 mmHg (04/06 0500) SpO2:  [97 %] 97 % (04/06 0500) Weight:  [156 lb 1.6 oz (70.806 kg)] 156 lb 1.6 oz (70.806 kg) (04/06 0446) Last BM Date: 08/23/13  Intake/Output from previous day: 04/05 0701 - 04/06 0700 In: 241 [I.V.:241] Out: -  Intake/Output this shift:    Medications Current Facility-Administered Medications  Medication Dose Route Frequency Provider Last Rate Last Dose  . 0.9 %  sodium chloride infusion  250 mL Intravenous PRN Duayne CalPaul W Hoffman, NP      . 0.9 %  sodium chloride infusion   Intravenous Continuous Nyoka CowdenMichael B Wert, MD 10 mL/hr at 08/22/13 2254 10 mL/hr at 08/22/13 2254  . amiodarone (PACERONE) tablet 100 mg  100 mg Oral Daily Jeanella CrazeBrandi L Ollis, NP   100 mg at 08/23/13 1012  . amLODipine (NORVASC) tablet 5 mg  5 mg Oral Daily Nyoka CowdenMichael B Wert, MD   5 mg at 08/23/13 1250  . cloNIDine (CATAPRES) tablet 0.1 mg  0.1 mg Oral TID Nyoka CowdenMichael B Wert, MD   0.1 mg at 08/23/13 2223  . feeding supplement (ENSURE COMPLETE) (ENSURE COMPLETE) liquid 237 mL  237 mL Oral BID BM Nyoka CowdenMichael B Wert, MD   237 mL at 08/23/13 1353  . furosemide (LASIX) tablet 20 mg  20 mg Oral Once per day on Mon Wed Fri Storm FriskPatrick E Wright, MD      . levothyroxine (SYNTHROID, LEVOTHROID) tablet 62.5 mcg  62.5 mcg Oral QAC breakfast Jeanella CrazeBrandi L Ollis, NP   62.5 mcg at 08/24/13 0854  . losartan (COZAAR) tablet 50 mg  50 mg Oral Daily Lewayne BuntingBrian S Crenshaw, MD   50 mg at 08/23/13 1009  . ondansetron (ZOFRAN) injection 4 mg  4 mg Intravenous Q8H PRN Nyoka CowdenMichael B Wert, MD   4 mg at 08/23/13 1009  . oxyCODONE-acetaminophen (PERCOCET/ROXICET) 5-325 MG per tablet 1 tablet  1 tablet Oral Q6H PRN Jeanella CrazeBrandi L Ollis, NP   1 tablet at 08/23/13 2225  . saccharomyces  boulardii (FLORASTOR) capsule 250 mg  250 mg Oral Daily Leslye Peerobert S Byrum, MD   250 mg at 08/23/13 1009  . sertraline (ZOLOFT) tablet 100 mg  100 mg Oral Daily Jeanella CrazeBrandi L Ollis, NP   100 mg at 08/23/13 1009    PE: General appearance: alert, cooperative and no distress Lungs: Mild rales Heart: regular rate and rhythm and 1/6 Sys MM Abdomen: +BS, soft nontender Extremities: No LEE Pulses: 2+ and symmetric Skin: Warm and dry Neurologic: Grossly normal  Lab Results:   Recent Labs  08/23/13 1010  WBC 9.4  HGB 9.7*  HCT 31.5*  PLT 132*   BMET  Recent Labs  08/23/13 1010  NA 136*  K 4.0  CL 98  CO2 27  GLUCOSE 127*  BUN 13  CREATININE 0.61  CALCIUM 8.7     Assessment/Plan  80 year old female with past medical history of paroxysmal atrial fibrillation, prior mitral valve replacement, hypertrophic obstructive cardiomyopathy, severe home O2 dependent COPD for preoperative evaluation prior to thoracic aortic aneurysm repair. Patient is status post mitral valve replacement with a tissue valve in 2007. Patient had cardiac catheterization in February 2013. This revealed no obstructive coronary disease. Ejection fraction 65%. PA pressure 53/20.  Pulmonary capillary wedge pressure 24. Most recent echocardiogram in February 2015 showed normal LV function, moderate left ventricular hypertrophy, mild aortic insufficiency, mild mitral regurgitation, biatrial enlargement, severe right ventricular enlargement and severe pulmonary hypertension. Mean gradient across the mitral valve was 9-13 mm of mercury.   Patient presented after a fall. She denies syncope. Workup included CT scans one of which incidentally revealed atype B thoracic aortic dissection. the aorta has markedly increased in size currently measuring 6.4 cm compared to 3.9 cm previously. The dissection flap does not extend into the abdominal aorta.   Principal Problem:   Dissecting aneurysm of descending thoracic aorta  Follow up CT  planned and FU with Dr. Myra Gianotti.  Active Problems:   ATRIAL FIBRILLATION, HX OF Maintaining SR. Very stable rate trend.  Coumadin reversed and will not be restarted due to above and fall risk.  BP stable.  Amiodarone 100mg , Clonidine 0.1mg , Cozaar 50.    Hypertrophic obstructive cardiomyopathy(425.11)  Restarting M, W, F lasix 20mg .  Wt stable.    COPD, mild  Per pulmonary   Pulmonary hypertension, secondary  CPAP   OSA (obstructive sleep apnea)   Fall   At high risk for falls   DNR (do not resuscitate)  Will Sign Off.   LOS: 4 days    HAGER, BRYAN PA-C 08/24/2013 9:47 AM  Patient seen, examined. Available data reviewed. Agree with findings, assessment, and plan as outlined by Wilburt Finlay, PA-C. Labs/vitals/notes reviewed. Appears stable from CV perspective on current med Rx which I have reviewed. Would continue same without changes.   Plans noted for further outpatient discussion regarding treatment options for her descending thoracic aortic aneurysm. She is at high-risk of surgical intervention, thus facing a difficult management decision. Continued observation is being considered.  Tonny Bollman, M.D. 08/24/2013 11:45 AM

## 2013-08-24 NOTE — Progress Notes (Signed)
eLink Physician-Brief Progress Note Patient Name: Patricia PattyDoris S Levels DOB: October 24, 1926 MRN: [id number]  Date of Service  08/24/2013   HPI/Events of Note   Called for hypotension.  eICU Interventions  Stop norvasc and clonidene May have to also stop cozaar   Intervention Category Major Interventions: Hypotension - evaluation and management  [name] 08/24/2013, 3:42 PM

## 2013-08-25 DIAGNOSIS — I5033 Acute on chronic diastolic (congestive) heart failure: Secondary | ICD-10-CM

## 2013-08-25 DIAGNOSIS — S22009A Unspecified fracture of unspecified thoracic vertebra, initial encounter for closed fracture: Secondary | ICD-10-CM

## 2013-08-25 LAB — BASIC METABOLIC PANEL WITH GFR
BUN: 18 mg/dL (ref 6–23)
CO2: 29 meq/L (ref 19–32)
Calcium: 8.8 mg/dL (ref 8.4–10.5)
Chloride: 100 meq/L (ref 96–112)
Creatinine, Ser: 0.81 mg/dL (ref 0.50–1.10)
GFR calc Af Amer: 75 mL/min — ABNORMAL LOW (ref >90)
GFR calc non Af Amer: 65 mL/min — ABNORMAL LOW (ref >90)
Glucose, Bld: 97 mg/dL (ref 70–99)
Potassium: 5.3 meq/L (ref 3.7–5.3)
Sodium: 139 meq/L (ref 137–147)

## 2013-08-25 MED ORDER — ENSURE COMPLETE PO LIQD: 237.0000 mL | Freq: Two times a day (BID) | ORAL | Status: DC

## 2013-08-25 MED ORDER — OXYCODONE-ACETAMINOPHEN 5-325 MG PO TABS: 1.0000 | ORAL_TABLET | Freq: Four times a day (QID) | ORAL | Status: DC | PRN

## 2013-08-25 NOTE — Clinical Social Work Placement (Signed)
Clinical Social Work Department CLINICAL SOCIAL WORK PLACEMENT NOTE 08/25/2013  Patient:  [name],Patricia S  Account Number:  [id number] Admit date:  08/20/2013  Clinical Social Worker:  Lavell LusterJOSEPH BRYANT Chaquita Basques, LCSWA  Date/time:  08/25/2013 02:45 PM  Clinical Social Work is seeking post-discharge placement for this patient at the following level of care:   SKILLED NURSING   (*CSW will update this form in Epic as items are completed)   08/25/2013  Patient/family provided with [facility] of Clinical Social Work's list of facilities offering this level of care within the geographic area requested by the patient (or if unable, by the patient's family).  08/25/2013  Patient/family informed of their freedom to choose among providers that offer the needed level of care, that participate in Medicare, Medicaid or managed care program needed by the patient, have an available bed and are willing to accept the patient.  08/25/2013  Patient/family informed of [facility]' ownership interest in [facility], as well as of the fact that they are under no obligation to receive care at this facility.  PASARR submitted to EDS on 08/25/2013 PASARR number received from EDS on 08/25/2013  FL2 transmitted to all facilities in geographic area requested by pt/family on  08/25/2013 FL2 transmitted to all facilities within larger geographic area on   Patient informed that his/her managed care company has contracts with or will negotiate with  certain facilities, including the following:     Patient/family informed of bed offers received:  08/25/2013 [facility] Physician recommends and patient chooses bed at    [facility]  08/25/2013 Patient to be transferred to facility by Daughter's vehicle  The following physician request were entered in Epic:   Additional Comments: Per MD patient  ready to DC to [facility], RN given number for report. DC packet on chart. RN instructed to give patient and family sealed packet for them to give to facility. CSW signing off at this time.   Roddie McBryant Jai Bear MSW, LambertLCSWA, [place], [phone]

## 2013-08-25 NOTE — Discharge Summary (Deleted)
Analiah Drum, MD Pulmonary and Critical Care Medicine [facility] Pager: [phone]  

## 2013-08-25 NOTE — Progress Notes (Addendum)
Patient refused CPAP at this time.  Patient encouraged to call if CPAP desired.  RN aware.

## 2013-08-25 NOTE — Clinical Social Work Note (Signed)
Updated DC summary faxed to facility.  Roddie McBryant Hetvi Shawhan MSW, Shoal Creek DriveLCSWA, [place], [phone]

## 2013-08-25 NOTE — Progress Notes (Signed)
Physical Therapy Treatment Patient Details Name: Patricia [name] MRN: [id number] DOB: [birth year] Today's Date: 08/25/2013    History of Present Illness 80 y.o. female admitted to [facility] on 08/20/13 with a fall (her third fall in 2 weeks) wiyh T12 compression fx and TypeB aortic dissection that is being treated conservatively for now.  Pt with significant PMHx of CAD, s/p MVR, HTN, COPD (on 2 L O2 [place] 24/7 at home), A-fib, left wirst and right ankle ORIF.      PT Comments    Pt able to gait 60' today, requires min guard for balance and safety with RW, cues for deep breathing.  Pt continues to require motivation to perform mobility.  Educated pt/daughter of importance of sitting up in chair and taking short walks to the bathroom with nursing to improve activity tolerance.  Follow Up Recommendations  SNF     Equipment Recommendations  None recommended by PT    Recommendations for Other Services       Precautions / Restrictions Precautions Precautions: Fall Restrictions Weight Bearing Restrictions: No    Mobility  Bed Mobility Overal bed mobility: Needs Assistance Bed Mobility: Sidelying to Sit   Sidelying to sit: Supervision       General bed mobility comments: able to perform without physical assist, cues for technique  Transfers Overall transfer level: Needs assistance Equipment used: Rolling walker (2 wheeled) Transfers: Sit to/from Stand Sit to Stand: Min guard         General transfer comment: lifting assist at trunk to transition to stand, cues for UE placement  Ambulation/Gait Ambulation/Gait assistance: Min guard Ambulation Distance (Feet): 60 Feet Assistive device: Rolling walker (2 wheeled)     Gait velocity interpretation: Below normal speed for age/gender General Gait Details: pt able to gait 60' with 1 standing rest break, cues for deep breathing, slight forward flexion of trunk during gait.  DOE 2/4   Radiographer, therapeutic Rankin (Stroke Patients Only)       Balance Overall balance assessment: Needs assistance         Standing balance support: During functional activity   Standing balance comment: pt able to stand to brush and put in dentures with supervision but requires leaning on the sink due to fatigue                    Cognition Arousal/Alertness: Awake/alert Behavior During Therapy: WFL for tasks assessed/performed Overall Cognitive Status: Within Functional Limits for tasks assessed                      Exercises      General Comments        Pertinent Vitals/Pain Pt c/o 5/10 B hip pain, RN made aware.  Treatment performed on 4L O2 [place], spO2 87-90% throughout    Home Living                      Prior Function            PT Goals (current goals can now be found in the care plan section) Progress towards PT goals: Progressing toward goals    Frequency  Min 3X/week    PT Plan Current plan remains appropriate    Co-evaluation             End of Session Equipment Utilized During Treatment: Oxygen Activity Tolerance: Patient  limited by fatigue Patient left: in chair;with family/visitor present;with call bell/phone within reach     Time: [id number] PT Time Calculation (min): 23 min  Charges:  $Gait Training: 8-22 mins $Therapeutic Activity: 8-22 mins                    G Codes:      Shaneen Reeser 08/25/2013, 9:43 AM

## 2013-09-01 ENCOUNTER — Encounter: Payer: Self-pay | Admitting: Physician Assistant

## 2013-09-02 ENCOUNTER — Encounter: Payer: Self-pay | Admitting: Physician Assistant

## 2013-09-02 ENCOUNTER — Ambulatory Visit (INDEPENDENT_AMBULATORY_CARE_PROVIDER_SITE_OTHER): Payer: Medicare Other | Admitting: Physician Assistant

## 2013-09-02 ENCOUNTER — Telehealth: Payer: Self-pay | Admitting: Physician Assistant

## 2013-09-02 ENCOUNTER — Encounter: Payer: Medicare Other | Admitting: Physician Assistant

## 2013-09-02 VITALS — BP 85/48 | HR 91 | Ht 64.0 in

## 2013-09-02 DIAGNOSIS — I509 Heart failure, unspecified: Secondary | ICD-10-CM

## 2013-09-02 DIAGNOSIS — I5033 Acute on chronic diastolic (congestive) heart failure: Secondary | ICD-10-CM

## 2013-09-02 DIAGNOSIS — I10 Essential (primary) hypertension: Secondary | ICD-10-CM

## 2013-09-02 DIAGNOSIS — Z5181 Encounter for therapeutic drug level monitoring: Secondary | ICD-10-CM

## 2013-09-02 DIAGNOSIS — Z8679 Personal history of other diseases of the circulatory system: Secondary | ICD-10-CM

## 2013-09-02 DIAGNOSIS — I71019 Dissection of thoracic aorta, unspecified: Secondary | ICD-10-CM

## 2013-09-02 DIAGNOSIS — I7101 Dissection of thoracic aorta: Secondary | ICD-10-CM

## 2013-09-02 DIAGNOSIS — I71012 Dissection of descending thoracic aorta: Secondary | ICD-10-CM

## 2013-09-02 DIAGNOSIS — Z7901 Long term (current) use of anticoagulants: Secondary | ICD-10-CM

## 2013-09-02 NOTE — Patient Instructions (Addendum)
Your physician recommends that you schedule a follow-up appointment in: 2-3 WEEKS WITH DR. ROSS  Your physician has recommended you make the following change in your medication:   DECREASE LASIX 20 MG Monday, Wednesday, Friday AND INCREASE AS NEEDED FOR YOUR CHF

## 2013-09-02 NOTE — Telephone Encounter (Signed)
call pt daughter to inform her that Patricia [name] reviewed labs from rehab facility and she still do not want any changes that she made during her office visit. Called rehab facility and spoke with Antionette to inform her that PA looked at pt's labs and would like to keep the same changes she made when she was in office. Antionette agreed. Per pt rehab nurse Antionette, she would like for office to fax over a statement stating that PA did not want any changes after looking at lab. Informed her that PA had left office for the day but she signed the lab sheets and Antionette stated that's ok. She can take the signature she put on the labs. Faxed over copy of labs that Patricia [name] signed.

## 2013-09-02 NOTE — Assessment & Plan Note (Signed)
Coumadin stopped after most recent fall.

## 2013-09-02 NOTE — Assessment & Plan Note (Signed)
Patient's blood pressures been running quite low. Decreased diuretics as described above.

## 2013-09-02 NOTE — Assessment & Plan Note (Signed)
Patient is still on amiodarone. Coumadin stopped because of recent fall.

## 2013-09-02 NOTE — Progress Notes (Signed)
HPI: This is an 80 year old female patient with history of paroxysmal atrial fibrillation on Coumadin who was admitted on 08/21/13 with a mechanical fall striking her head and back. Full workup found interval development of a type B. aortic dissection measuring 6.4. She also has prior mitral valve replacement with tissue valve in 2007, hypertrophic obstructive cardiomyopathy, severe home O2 dependent COPD. Patient had cardiac catheterization in February 2013 revealing no obstructive coronary disease ejection fraction 65% PA pressure 53/20 pulmonary capillary wedge pressure 24. Most recent echo on February 2015 showed normal LV function, moderate LVH with mild 80 I., mild MR, biatrial enlargement, severe right ventricular enlargement and severe pulmonary hypertension. Mean gradient across the mitral 12 was 9-13 mm mercury.  Patient's Norvasc and clonidine were stopped because of low normal blood pressures, amiodarone was continued, Coumadin was stopped because of high risk fall risk and she was placed on Lasix 20 mg Monday Wednesday and Fridays which was increased to 40 mg daily for 3 days for apparent increase in BNP.  The patient is at nursing home and doing physical therapy. She denies any chest pain, palpitations, dyspnea, dyspnea on exertion, dizziness, or presyncope. She can't remember if she's had any edema. She is on oxygen but not wearing it now because her tank is low. She doesn't feel like she needs it all the time. Her blood pressure is quite low.  Allergies -- Ivp Dye [Iodinated Diagnostic Agents] -- Itching  -- Nitrofurantoin -- Itching  -- Ramipril    --  unknown  Current Outpatient Prescriptions on File Prior to Visit: amiodarone (PACERONE) 200 MG tablet, Take 100 mg by mouth daily., Disp: , Rfl:  calcitonin, salmon, (MIACALCIN/FORTICAL) 200 UNIT/ACT nasal spray, Place 1 spray into alternate nostrils daily. , Disp: , Rfl:  Calcium Carbonate-Vitamin D (CALCIUM 500 + D PO), Take 1 tablet by  mouth daily., Disp: , Rfl:  cholecalciferol (VITAMIN D) 1000 UNITS tablet, Take 1,000 Units by mouth at bedtime. , Disp: , Rfl:  Coenzyme Q10 (COQ10 PO), Take 300 mg by mouth daily., Disp: , Rfl:  feeding supplement, ENSURE COMPLETE, (ENSURE COMPLETE) LIQD, Take 237 mLs by mouth 2 (two) times daily between meals., Disp: , Rfl:  Flaxseed, Linseed, (TH FLAX SEED OIL PO), Take 1 capsule by mouth daily., Disp: , Rfl:  furosemide (LASIX) 20 MG tablet, Take 20 mg by mouth 3 (three) times a week. Monday, Wednesday and friday, Disp: , Rfl:  levothyroxine (SYNTHROID, LEVOTHROID) 125 MCG tablet, Take 62.5 mcg by mouth daily before breakfast., Disp: , Rfl:  lidocaine (XYLOCAINE) 2 % jelly, Apply 1 application topically daily as needed (pain)., Disp: , Rfl:  nitroGLYCERIN (NITROSTAT) 0.4 MG SL tablet, Place 0.4 mg under the tongue every 5 (five) minutes as needed for chest pain., Disp: , Rfl:  OVER THE COUNTER MEDICATION, Apply 1 application topically daily as needed (pain). Shingles rescue, Disp: , Rfl:  oxyCODONE-acetaminophen (PERCOCET/ROXICET) 5-325 MG per tablet, Take 1 tablet by mouth every 6 (six) hours as needed for moderate pain or severe pain., Disp: 30 tablet, Rfl: 0 Probiotic Product (PROBIOTIC DAILY PO), Take 1 tablet by mouth daily., Disp: , Rfl:  sertraline (ZOLOFT) 100 MG tablet, Take 100 mg by mouth daily., Disp: , Rfl:  valACYclovir (VALTREX) 1000 MG tablet, Take 1 tablet (1,000 mg total) by mouth 3 (three) times daily., Disp: 21 tablet, Rfl: 0  No current facility-administered medications on file prior to visit.   Past Medical History:   CAD (coronary artery disease)  Mitral valve disorder                                          Comment:a. s/p MVR;  b. 06/2013 Echo: EF 55-60%, no               rwma, Ao sclerosis, mild AI, mild MR, miod dil               LA, sev dil RV with reduced fxn, sev dil RA,               mod TR/PR, PASP 60mmHg (study reviewed by Dr.                Verlin FesterKN- MS felt to be sev, peak grad 39/mean 13).   HTN (hypertension)                                           Dyslipidemia                                                 Aortic stenosis, mild                                        Breast cyst                                                  Hypertrophic cardiomyopathy                                  Sleep apnea                                                  COPD (chronic obstructive pulmonary disease)                   Comment:"a touch of"; prn o2 at home with activity    Atrial fibrillation                                            Comment:a. chronic coumadin.   Hypothyroidism                                               Anemia  Chronic right-sided heart failure                           Past Surgical History:   LAPAROSCOPIC CHOLECYSTECTOMY                     2009         CATARACT EXTRACTION                              2003           Comment:bilateral   MITRAL VALVE REPLACEMENT                         2007         NECK MASS EXCISION                               2003           Comment:benign   COLONOSCOPY                                                   broken wrist                                                    Comment:left, metal implanted    broken ankle                                                    Comment:right, metal implanted    CARDIAC CATHETERIZATION                          06/2011       BREAST SURGERY                                                  Comment:bilateral cysts x3 removed, benign    TEE WITHOUT CARDIOVERSION                        01/31/2012      Comment:Procedure: TRANSESOPHAGEAL ECHOCARDIOGRAM               (TEE);  Surgeon: Pricilla RifflePaula V Ross, MD;  [facility];  Service: Cardiovascular;                Laterality: N/A;   CARDIOVERSION                                   N/A 01/16/2013      Comment:Procedure:  CARDIOVERSION;  Surgeon: Loistine ChancePhilip  Earnstine Regal, MD;  Location: [facility] ENDOSCOPY;  Service:               Cardiovascular;  Laterality: N/A;  Review of patient's family history indicates:   Diabetes                       Mother                   CVA                            Mother                   Other                          Father                   Diabetes                       Brother                  Cancer                         Brother                    Comment: throat ca, smoker   Aneurysm                       Brother                    Comment: brain aneurysm   Breast cancer                  Sister                   Emphysema                      Sister                     Comment: smoker   Social History   Marital Status: Married             Spouse Name:                      Years of Education:                 Number of children:             Occupational History Occupation          Associate Professor            Comment              Retired                                   Social History Main Topics   Smoking Status: Former Smoker                   Packs/Day: 1.00  Years: 40        Types: Cigarettes  Quit date: 05/21/1990   Smokeless Status: Never Used                       Alcohol Use: No             Drug Use: No             Sexual Activity: No                 Other Topics            Concern   None on file  Social History Narrative   Lives with her husband.  Drives and walks around without assist device.        ROS: In a wheelchair, on oxygen, in but overall feeling well. See history of present illness otherwise negative   PHYSICAL EXAM: Well-nournished, in no acute distress. Neck: No JVD, HJR, Bruit, or thyroid enlargement  Lungs: No tachypnea, clear without wheezing, rales, or rhonchi  Cardiovascular: RRR, PMI not displaced, 2/6 systolic murmur at the left sternal border, no gallops, bruit, thrill, or heave.  Abdomen: Pulsatile widened aorta, BS  normal. Soft without organomegaly, masses, lesions or tenderness.  Extremities: without cyanosis, clubbing or edema. Good distal pulses bilateral  SKin: Warm, no lesions or rashes   Musculoskeletal: No deformities  Neuro: no focal signs  BP 85/48  Pulse 91  Ht 5\' 4"  (1.626 m)  SpO2 80%

## 2013-09-02 NOTE — Assessment & Plan Note (Signed)
Aneurysm increased to 6.4 cm. To be seen by the surgeons in may. Currently being followed medically.

## 2013-09-02 NOTE — Assessment & Plan Note (Signed)
There is no evidence of heart failure on exam today. Blood pressure is quite low. With aortic dissection we have to be careful to avoid hypotension. Decrease Lasix to 20 mg Monday Wednesday Friday. Increase as needed for heart failure.

## 2013-09-02 NOTE — Telephone Encounter (Signed)
New message     They faxed a BMET lab result to our office hoping it would get to you before pt left.  Patients instructions does not reflect the labs already done.  Please call so that they can give you the BMET results and get instructions

## 2013-09-03 ENCOUNTER — Telehealth: Payer: Self-pay | Admitting: Internal Medicine

## 2013-09-03 NOTE — Telephone Encounter (Signed)
New message    Need to check on conflict information she getting from nursing home.

## 2013-09-03 NOTE — Telephone Encounter (Addendum)
Spoke with pt dtr, aware of notes recorded in the pt chart. Will discuss with octavia, the covering MA, to make sure I read this correctly and call the dtr back. dtr agreed with this plan

## 2013-09-03 NOTE — Telephone Encounter (Signed)
Spoke with pt dtr, aware the furosemide is 3x weekly.

## 2013-09-06 ENCOUNTER — Emergency Department (HOSPITAL_COMMUNITY): Payer: Medicare Other

## 2013-09-06 ENCOUNTER — Emergency Department (HOSPITAL_COMMUNITY)
Admission: EM | Admit: 2013-09-06 | Discharge: 2013-09-06 | Disposition: A | Discharge status: Home / Self Care | Payer: Medicare Other | Attending: Emergency Medicine | Admitting: Emergency Medicine

## 2013-09-06 ENCOUNTER — Encounter (HOSPITAL_COMMUNITY): Payer: Self-pay | Admitting: Emergency Medicine

## 2013-09-06 DIAGNOSIS — I059 Rheumatic mitral valve disease, unspecified: Secondary | ICD-10-CM

## 2013-09-06 DIAGNOSIS — I5033 Acute on chronic diastolic (congestive) heart failure: Secondary | ICD-10-CM

## 2013-09-06 DIAGNOSIS — I10 Essential (primary) hypertension: Secondary | ICD-10-CM | POA: Insufficient documentation

## 2013-09-06 DIAGNOSIS — E785 Hyperlipidemia, unspecified: Secondary | ICD-10-CM | POA: Insufficient documentation

## 2013-09-06 DIAGNOSIS — Z862 Personal history of diseases of the blood and blood-forming organs and certain disorders involving the immune mechanism: Secondary | ICD-10-CM | POA: Insufficient documentation

## 2013-09-06 DIAGNOSIS — G4730 Sleep apnea, unspecified: Secondary | ICD-10-CM | POA: Insufficient documentation

## 2013-09-06 DIAGNOSIS — I71012 Dissection of descending thoracic aorta: Secondary | ICD-10-CM

## 2013-09-06 DIAGNOSIS — I4891 Unspecified atrial fibrillation: Secondary | ICD-10-CM | POA: Insufficient documentation

## 2013-09-06 DIAGNOSIS — E039 Hypothyroidism, unspecified: Secondary | ICD-10-CM | POA: Insufficient documentation

## 2013-09-06 DIAGNOSIS — I2510 Atherosclerotic heart disease of native coronary artery without angina pectoris: Secondary | ICD-10-CM | POA: Insufficient documentation

## 2013-09-06 DIAGNOSIS — J441 Chronic obstructive pulmonary disease with (acute) exacerbation: Secondary | ICD-10-CM | POA: Insufficient documentation

## 2013-09-06 DIAGNOSIS — Z8742 Personal history of other diseases of the female genital tract: Secondary | ICD-10-CM | POA: Insufficient documentation

## 2013-09-06 DIAGNOSIS — Z9889 Other specified postprocedural states: Secondary | ICD-10-CM | POA: Insufficient documentation

## 2013-09-06 DIAGNOSIS — Z9981 Dependence on supplemental oxygen: Secondary | ICD-10-CM | POA: Insufficient documentation

## 2013-09-06 DIAGNOSIS — I509 Heart failure, unspecified: Secondary | ICD-10-CM | POA: Insufficient documentation

## 2013-09-06 DIAGNOSIS — I7101 Dissection of thoracic aorta: Secondary | ICD-10-CM

## 2013-09-06 DIAGNOSIS — Z87891 Personal history of nicotine dependence: Secondary | ICD-10-CM | POA: Insufficient documentation

## 2013-09-06 DIAGNOSIS — IMO0002 Reserved for concepts with insufficient information to code with codable children: Secondary | ICD-10-CM

## 2013-09-06 DIAGNOSIS — Z7901 Long term (current) use of anticoagulants: Secondary | ICD-10-CM | POA: Insufficient documentation

## 2013-09-06 DIAGNOSIS — Z79899 Other long term (current) drug therapy: Secondary | ICD-10-CM | POA: Insufficient documentation

## 2013-09-06 DIAGNOSIS — IMO0001 Reserved for inherently not codable concepts without codable children: Secondary | ICD-10-CM

## 2013-09-06 LAB — CBC
HEMATOCRIT: 32.6 % — AB (ref 36.0–46.0)
HEMOGLOBIN: 10 g/dL — AB (ref 12.0–15.0)
MCH: 25.1 pg — ABNORMAL LOW (ref 26.0–34.0)
MCHC: 30.7 g/dL (ref 30.0–36.0)
MCV: 81.7 fL (ref 78.0–100.0)
Platelets: 207 10*3/uL (ref 150–400)
RBC: 3.99 MIL/uL (ref 3.87–5.11)
RDW: 20.8 % — AB (ref 11.5–15.5)
WBC: 11.1 10*3/uL — AB (ref 4.0–10.5)

## 2013-09-06 LAB — I-STAT CHEM 8, ED
BUN: 26 mg/dL — ABNORMAL HIGH (ref 6–23)
CALCIUM ION: 1.14 mmol/L (ref 1.13–1.30)
CHLORIDE: 101 meq/L (ref 96–112)
CREATININE: 0.9 mg/dL (ref 0.50–1.10)
Glucose, Bld: 99 mg/dL (ref 70–99)
HCT: 33 % — ABNORMAL LOW (ref 36.0–46.0)
Hemoglobin: 11.2 g/dL — ABNORMAL LOW (ref 12.0–15.0)
POTASSIUM: 3.8 meq/L (ref 3.7–5.3)
Sodium: 141 mEq/L (ref 137–147)
TCO2: 29 mmol/L (ref 0–100)

## 2013-09-06 LAB — I-STAT TROPONIN, ED: Troponin i, poc: 0.01 ng/mL (ref 0.00–0.08)

## 2013-09-06 LAB — PRO B NATRIURETIC PEPTIDE: PRO B NATRI PEPTIDE: 6779 pg/mL — AB (ref 0–450)

## 2013-09-06 MED ORDER — VANCOMYCIN HCL IN DEXTROSE 1-5 GM/200ML-% IV SOLN
1000.0000 mg | Freq: Once | INTRAVENOUS | Status: AC
  Administered 2013-09-06: 1000 mg via INTRAVENOUS
  Filled 2013-09-06: qty 200

## 2013-09-06 MED ORDER — PIPERACILLIN-TAZOBACTAM 3.375 G IVPB
3.3750 g | Freq: Once | INTRAVENOUS | Status: AC
  Administered 2013-09-06: 3.375 g via INTRAVENOUS
  Filled 2013-09-06: qty 50

## 2013-09-06 MED ORDER — FUROSEMIDE 10 MG/ML IJ SOLN
40.0000 mg | Freq: Once | INTRAMUSCULAR | Status: AC
  Administered 2013-09-06: 40 mg via INTRAVENOUS
  Filled 2013-09-06: qty 4

## 2013-09-06 MED ORDER — FUROSEMIDE 20 MG PO TABS: 20.0000 mg | ORAL_TABLET | Freq: Every day | ORAL | Status: DC

## 2013-09-06 NOTE — ED Notes (Signed)
Dr. Opitz at the bedside.  

## 2013-09-06 NOTE — ED Notes (Signed)
Portable x-ray at the bedside.  

## 2013-09-06 NOTE — Discharge Instructions (Signed)
Take Lasix daily for the next 5 days as discussed. Follow up with your cardiologist for recheck and further evaluation.  Atrial Fibrillation  Atrial fibrillation is a type of irregular heart rhythm (arrhythmia). During atrial fibrillation, the upper chambers of the heart (atria) quiver continuously in a chaotic pattern. This causes an irregular and often rapid heart rate.  Atrial fibrillation is the result of the heart becoming overloaded with disorganized signals that tell it to beat. These signals are normally released one at a time by a part of the right atrium called the sinoatrial node. They then travel from the atria to the lower chambers of the heart (ventricles), causing the atria and ventricles to contract and pump blood as they pass. In atrial fibrillation, parts of the atria outside of the sinoatrial node also release these signals. This results in two problems. First, the atria receive so many signals that they do not have time to fully contract. Second, the ventricles, which can only receive one signal at a time, beat irregularly and out of rhythm with the atria.  There are three types of atrial fibrillation:  Paroxysmal Paroxysmal atrial fibrillation starts suddenly and stops on its own within a week.  Persistent Persistent atrial fibrillation lasts for more than a week. It may stop on its own or with treatment.  Permanent Permanent atrial fibrillation does not go away. Episodes of atrial fibrillation may lead to permanent atrial fibrillation.  Atrial fibrillation can prevent your heart from pumping blood normally. It increases your risk of stroke and can lead to heart failure.  CAUSES  Heart conditions, including a heart attack, heart failure, coronary artery disease, and heart valve conditions.  Inflammation of the sac that surrounds the heart (pericarditis).  Blockage of an artery in the lungs (pulmonary embolism).  Pneumonia or other infections.  Chronic lung disease.  Thyroid  problems, especially if the thyroid is overactive (hyperthyroidism).  Caffeine, excessive alcohol use, and use of some illegal drugs.  Use of some medications, including certain decongestants and diet pills.  Heart surgery.  Birth defects.  Sometimes, no cause can be found. When this happens, the atrial fibrillation is called lone atrial fibrillation. The risk of complications from atrial fibrillation increases if you have lone atrial fibrillation and you are age 78 years or older.  RISK FACTORS  Heart failure.  Coronary artery disease  Diabetes mellitus.  High blood pressure (hypertension).  Obesity.  Other arrhythmias.  Increased age. SYMPTOMS  A feeling that your heart is beating rapidly or irregularly.  A feeling of discomfort or pain in your chest.  Shortness of breath.  Sudden lightheadedness or weakness.  Getting tired easily when exercising.  Urinating more often than normal (mainly when atrial fibrillation first begins).  In paroxysmal atrial fibrillation, symptoms may start and suddenly stop.  DIAGNOSIS  Your caregiver may be able to detect atrial fibrillation when taking your pulse. Usually, testing is needed to diagnosis atrial fibrillation. Tests may include:  Electrocardiography. During this test, the electrical impulses of your heart are recorded while you are lying down.  Echocardiography. During echocardiography, sound waves are used to evaluate how blood flows through your heart.  Stress test. There is more than one type of stress test. If a stress test is needed, ask your caregiver about which type is best for you.  Chest X-ray exam.  Blood tests.  Computed tomography (CT).  TREATMENT  Treating any underlying conditions. For example, if you have an overactive thyroid, treating the condition may correct  atrial fibrillation.  Medication. Medications may be given to control a rapid heart rate or to prevent blood clots, heart failure, or a stroke.  Procedure to correct  the rhythm of the heart:  Electrical cardioversion. During electrical cardioversion, a controlled, low-energy shock is delivered to the heart through your skin. If you have chest pain, very low pressure blood pressure, or sudden heart failure, this procedure may need to be done as an emergency.  Catheter ablation. During this procedure, heart tissues that send the signals that cause atrial fibrillation are destroyed.  Maze or minimaze procedure. During this surgery, thin lines of heart tissue that carry the abnormal signals are destroyed. The maze procedure is an open-heart surgery. The minimaze procedure is a minimally invasive surgery. This means that small cuts are made to access the heart instead of a large opening.  Pulmonary venous isolation. During this surgery, tissue around the veins that carry blood from the lungs (pulmonary veins) is destroyed. This tissue is thought to carry the abnormal signals. HOME CARE INSTRUCTIONS  Take medications as directed by your caregiver.  Only take medications that your caregiver approves. Some medications can make atrial fibrillation worse or recur.  If blood thinners were prescribed by your caregiver, take them exactly as directed. Too much can cause bleeding. Too little and you will not have the needed protection against stroke and other problems.  Perform blood tests at home if directed by your caregiver.  Perform blood tests exactly as directed.  Quit smoking if you smoke.  Do not drink alcohol.  Do not drink caffeinated beverages such as coffee, soda, and some teas. You may drink decaffeinated coffee, soda, or tea.  Maintain a healthy weight. Do not use diet pills unless your caregiver approves. They may make heart problems worse.  Follow diet instructions as directed by your caregiver.  Exercise regularly as directed by your caregiver.  Keep all follow-up appointments. PREVENTION  The following substances can cause atrial fibrillation to recur:    Caffeinated beverages.  Alcohol.  Certain medications, especially those used for breathing problems.  Certain herbs and herbal medications, such as those containing ephedra or ginseng.  Illegal drugs such as cocaine and amphetamines. Sometimes medications are given to prevent atrial fibrillation from recurring. Proper treatment of any underlying condition is also important in helping prevent recurrence.  SEEK MEDICAL CARE IF:  You notice a change in the rate, rhythm, or strength of your heartbeat.  You suddenly begin urinating more frequently.  You tire more easily when exerting yourself or exercising.  SEEK IMMEDIATE MEDICAL CARE IF:  You develop chest pain, abdominal pain, sweating, or weakness.  You feel sick to your stomach (nauseous).  You develop shortness of breath.  You suddenly develop swollen feet and ankles.  You feel dizzy.  You face or limbs feel numb or weak.  There is a change in your vision or speech. MAKE SURE YOU:  Understand these instructions.  Will watch your condition.  Will get help right away if you are not doing well or get worse. Document Released: 05/07/2005 Document Revised: 09/01/2012 Document Reviewed: 06/17/2012  [facility] Patient Information 2014 [place], [place].

## 2013-09-06 NOTE — ED Provider Notes (Signed)
CSN: [id number]     Arrival date & time 09/06/13  0153 History   First MD Initiated Contact with Patient 09/06/13 0246     Chief Complaint  Patient presents with  . Shortness of Breath     (Consider location/radiation/quality/duration/timing/severity/associated sxs/prior Treatment) HPI History provided by patient and family bedside. History of COPD, heart disease, CHF, atrial fibrillation and recent diagnosis of thoracic aneurysm this month, was taken off of Coumadin.  She sleeps with CPAP at nighttime. Tonight after going to sleep, staff at nursing home noted her pulse ox to be in the 80s. Patient did not have any difficulty breathing or respiratory complaints. No chest pain or palpitations. Her heart rate was noted to be 90-120. She was switched to nasal cannula oxygen and her pulse ox improved. She was transported by EMS to the emergency room for further evaluation. She denies any complaints. No weight gain or leg swelling. No recent cough or fevers. Patient is prescribed Lasix that she takes 3 times a week.    Past Medical History  Diagnosis Date  . CAD (coronary artery disease)   . Mitral valve disorder     a. s/p MVR;  b. 06/2013 Echo: EF 55-60%, no rwma, Ao sclerosis, mild AI, mild MR, miod dil LA, sev dil RV with reduced fxn, sev dil RA, mod TR/PR, PASP 60mmHg (study reviewed by Dr. Verlin FesterKN- MS felt to be sev, peak grad 39/mean 13).  [name]. HTN (hypertension)   . Dyslipidemia   . Aortic stenosis, mild   . Breast cyst   . Hypertrophic cardiomyopathy   . Sleep apnea   . COPD (chronic obstructive pulmonary disease)     "a touch of"; prn o2 at home with activity   . Atrial fibrillation     a. chronic coumadin.  [name]. Hypothyroidism   . Anemia   . Chronic right-sided heart failure    Past Surgical History  Procedure Laterality Date  . Laparoscopic cholecystectomy  2009  . Cataract extraction  2003    bilateral  . Mitral valve replacement  2007  . Neck mass excision  2003    benign  .  Colonoscopy    . Broken wrist      left, metal implanted   . Broken ankle      right, metal implanted   . Cardiac catheterization  06/2011  . Breast surgery      bilateral cysts x3 removed, benign   . Tee without cardioversion  01/31/2012    Procedure: TRANSESOPHAGEAL ECHOCARDIOGRAM (TEE);  Surgeon: Pricilla RifflePaula V Ross, MD;  [facility];  Service: Cardiovascular;  Laterality: N/A;  . Cardioversion N/A 01/16/2013    Procedure: CARDIOVERSION;  Surgeon: Vesta MixerPhilip J Nahser, MD;  [facility];  Service: Cardiovascular;  Laterality: N/A;   Family History  Problem Relation Age of Onset  . Diabetes Mother   . CVA Mother   . Other Father   . Diabetes Brother   . Cancer Brother     throat ca, smoker  . Aneurysm Brother     brain aneurysm  . Breast cancer Sister   . Emphysema Sister     smoker   History  Substance Use Topics  . Smoking status: Former Smoker -- 1.00 packs/day for 40 years    Types: Cigarettes    Quit date: 05/21/1990  . Smokeless tobacco: Never Used  . Alcohol Use: No   OB History   Grav Para Term Preterm Abortions TAB SAB Ect Mult Living  Review of Systems  Constitutional: Negative for fever and chills.  Respiratory: Negative for shortness of breath.   Cardiovascular: Negative for chest pain, palpitations and leg swelling.  Gastrointestinal: Negative for abdominal pain.  Genitourinary: Negative for dysuria.  Musculoskeletal: Negative for back pain, neck pain and neck stiffness.  Skin: Negative for rash.  Neurological: Negative for weakness and headaches.  All other systems reviewed and are negative.     Allergies  Ivp dye; Nitrofurantoin; and Ramipril  Home Medications   Prior to Admission medications   Medication Sig Start Date End Date Taking? Authorizing Provider  amiodarone (PACERONE) 200 MG tablet Take 100 mg by mouth daily. 11/28/12  Yes Pricilla Riffle, MD  Calcium Carbonate-Vitamin D (CALCIUM 500 + D PO) Take 1 tablet by  mouth daily.   Yes Historical Provider, MD  cholecalciferol (VITAMIN D) 1000 UNITS tablet Take 1,000 Units by mouth at bedtime.    Yes Historical Provider, MD  Coenzyme Q10 (COQ10 PO) Take 300 mg by mouth daily.   Yes Historical Provider, MD  Flaxseed, Linseed, (TH FLAX SEED OIL PO) Take 1 capsule by mouth daily.   Yes Historical Provider, MD  levothyroxine (SYNTHROID, LEVOTHROID) 125 MCG tablet Take 62.5 mcg by mouth daily before breakfast.   Yes Historical Provider, MD  lidocaine (XYLOCAINE) 2 % jelly Apply 1 application topically daily as needed (pain).   Yes Historical Provider, MD  losartan (COZAAR) 100 MG tablet Take 100 mg by mouth daily.  08/07/13  Yes Historical Provider, MD  oxyCODONE-acetaminophen (PERCOCET/ROXICET) 5-325 MG per tablet Take 1 tablet by mouth every 6 (six) hours as needed for moderate pain or severe pain. 08/25/13  Yes Jeanella Craze, NP  polyethylene glycol (MIRALAX / GLYCOLAX) packet Take 17 g by mouth daily as needed for mild constipation.    Yes Historical Provider, MD  Probiotic Product (PROBIOTIC DAILY PO) Take 1 tablet by mouth daily.   Yes Historical Provider, MD  sertraline (ZOLOFT) 100 MG tablet Take 100 mg by mouth daily.   Yes Historical Provider, MD  furosemide (LASIX) 20 MG tablet Take 20 mg by mouth 3 (three) times a week. Monday, Wednesday and friday    Historical Provider, MD  nitroGLYCERIN (NITROSTAT) 0.4 MG SL tablet Place 0.4 mg under the tongue every 5 (five) minutes as needed for chest pain.    Historical Provider, MD   BP 125/77  Pulse 106  Temp(Src) 99.5 F (37.5 C) (Rectal)  Resp 25  Ht 5\' 4"  (1.626 m)  Wt 150 lb (68.04 kg)  BMI 25.73 kg/m2  SpO2 93% Physical Exam  Constitutional: She is oriented to person, place, and time. She appears well-developed and well-nourished.  HENT:  Head: Normocephalic and atraumatic.  Eyes: EOM are normal. Pupils are equal, round, and reactive to light.  Neck: Neck supple.  Cardiovascular: Normal rate and  intact distal pulses.   Irregular with rate in the 90s  Pulmonary/Chest: Effort normal. No respiratory distress.  Abdominal: Soft. She exhibits no distension. There is no tenderness.  Musculoskeletal: Normal range of motion. She exhibits no edema and no tenderness.  No lower extremity edema  Neurological: She is alert and oriented to person, place, and time.  Skin: Skin is warm and dry.    ED Course  Procedures (including critical care time) Labs Review Labs Reviewed  CBC - Abnormal; Notable for the following:    WBC 11.1 (*)    Hemoglobin 10.0 (*)    HCT 32.6 (*)    [facility]  25.1 (*)    RDW 20.8 (*)    All other components within normal limits  PRO B NATRIURETIC PEPTIDE - Abnormal; Notable for the following:    Pro B Natriuretic peptide (BNP) 6779.0 (*)    All other components within normal limits  I-STAT CHEM 8, ED - Abnormal; Notable for the following:    BUN 26 (*)    Hemoglobin 11.2 (*)    HCT 33.0 (*)    All other components within normal limits  I-STAT TROPOININ, ED    Imaging Review Dg Chest Portable 1 View  09/06/2013   CLINICAL DATA:  Shortness of breath  EXAM: PORTABLE CHEST - 1 VIEW  COMPARISON:  07/12/2013  FINDINGS: Cardiac enlargement with mild pulmonary vascular congestion. Prominent perihilar opacities are increasing since previous study and could represent central vascular prominence or lymphadenopathy. Prominence of the pulmonary arteries was demonstrated previously. This could indicate pulmonary arterial hypertension with increased since previous studies. There is diffuse interstitial changes suggesting edema. Developing small left pleural effusion with basilar atelectasis.  IMPRESSION: Cardiac enlargement with pulmonary vascular congestion, probable interstitial edema, and small left pleural effusion. Increasing perihilar opacities since previous study probably represent prominent pulmonary arteries but developing lymphadenopathy could also have this appearance.    Electronically Signed   By: Burman NievesWilliam  Stevens M.D.   On: 09/06/2013 02:30     EKG Interpretation   Date/Time:  Sunday September 06 2013 03:14:22 EDT Ventricular Rate:  84 PR Interval:    QRS Duration: 131 QT Interval:  461 QTC Calculation: 545 R Axis:   -72 Text Interpretation:  Atrial fibrillation Non-specific intra-ventricular  conduction delay Confirmed by Azzie Thiem  MD, Jennae Hakeem ([id number]) on 09/06/2013  3:27:59 AM     IV Lasix provided. IV antibiotics for questionable infiltrates on chest x-ray.  3:43 AM d/w CAR - will evaluate bedside. After evaluating, recommends okay for discharge back to nursing facility with plan take Lasix 20 mg daily for the next 5 days and followup with her cardiologist Dr. Tenny Crawoss. Patient and family agreeable to plan.  MDM   Diagnosis: Atrial fibrillation, history of CHF, low pulse ox at nursing home  Primary EMS for low pulse ox at nursing facility. Patient has no complaints. She was evaluated with labs, EKG and imaging reviewed as above. IV Lasix given for pulmonary edema. Cardiology consult in and evaluated patient in the ER, recommended increase Lasix and okay for discharge back to facility with close outpatient cardiology followup. Vital signs and nursing notes reviewed and considered. Previous EMR records reviewed. Patient very much prefers to be discharged and not admitted to the hospital.     Sunnie NielsenBrian Bayan Kushnir, MD 09/06/13 [phone]

## 2013-09-06 NOTE — ED Notes (Signed)
Patient sent home with MOST form back to facility.

## 2013-09-06 NOTE — ED Notes (Signed)
Per EMS, patient is from [facility].  Last rounding at 2300 last night, the patient was fine, wearing CPAP for sleep.  Later on that night, CPAP was removed, and she was placed on nasal cannula.  Her O2 sats were in the 80's, and would not increase with oxygen support.  Patient had fall on 4/3, and Type B "aortic dissection" was diagnosed.  Patient currently has no complaints on arrival, lungs are diminishes with O2 sat reading of 79 on room air. Oxygen support applied via nasal cannula, with increase up to 4L to reach O2 sat level of 94%.  No treatments en route with EMS, and patient was showing A-fib on the monitor.  VS en route: 122/78, P 80, O2 sat 98% on 2L.

## 2013-09-07 ENCOUNTER — Telehealth: Payer: Self-pay | Admitting: Internal Medicine

## 2013-09-07 NOTE — Telephone Encounter (Signed)
New Message:  Pt's daughter, Galen Dafteresa Walker, is requesting the pt be worked in sooner to see Dr. Tenny Crawoss. She does not want her mom to see the PA. Pt is scheduled for 5/12 w/ Dr. Tenny Crawoss... Mrs Dan HumphreysWalker states that is not soon enough. She is requesting a call back from the nurse.

## 2013-09-07 NOTE — Telephone Encounter (Signed)
Spoke with pt dtr, the pt was taken to the ER over the weekend due to heart failure. They increased her furosemide to 20 mg daily for 5 days. She is feeling better today. She has a follow up with dr Tenny Crawross 09-29-13 and her dtr wants to know if that needs to be moved to sooner. Will forward for dr Tenny Crawross review

## 2013-09-08 ENCOUNTER — Encounter: Payer: Medicare Other | Admitting: Physician Assistant

## 2013-09-08 NOTE — Telephone Encounter (Signed)
Spoke with pt dtr. Follow up scheduled

## 2013-09-08 NOTE — Telephone Encounter (Signed)
Pt's daughter Galen Dafteresa Walker would appreciate a call back from you today. She said she waited on you all day yesterday to call her back. Please call and advise. Daughter wasn't very pleasant.

## 2013-09-10 ENCOUNTER — Encounter: Payer: Self-pay | Admitting: Emergency Medicine

## 2013-09-10 ENCOUNTER — Ambulatory Visit (INDEPENDENT_AMBULATORY_CARE_PROVIDER_SITE_OTHER): Payer: Medicare Other | Admitting: Emergency Medicine

## 2013-09-10 VITALS — BP 130/84 | HR 81 | Ht 64.0 in | Wt 160.0 lb

## 2013-09-10 DIAGNOSIS — J9611 Chronic respiratory failure with hypoxia: Secondary | ICD-10-CM

## 2013-09-10 DIAGNOSIS — J9610 Chronic respiratory failure, unspecified whether with hypoxia or hypercapnia: Secondary | ICD-10-CM

## 2013-09-10 DIAGNOSIS — R0902 Hypoxemia: Secondary | ICD-10-CM

## 2013-09-10 NOTE — Patient Instructions (Signed)
Stop CPAP for now. We will use [place] oxygen at 4L/min at night Follow with Dr Tenny Crawoss 4/24 as planned Follow with Dr Delton CoombesByrum in 4-6 weeks

## 2013-09-10 NOTE — Progress Notes (Signed)
Subjective:    Patient ID: Patricia [name], female    DOB: [birth year], 80 y.o.   MRN: [id number]  HPI Pleasant 80 yo woman followed by Drs Renne CriglerPharr and Tenny Crawoss. She is a former smoker (40 pack-yrs) and has a history of mechanical MVR, atrial fibrillation, HTN with a hypertrophic LV and diastolic dysfunction w HOCM physiology. She has had some difficulty and functional decline in the past when either tachycardic or bradycardic, has been successfully managed with amiodarone.   Tells me that she has noticed slow progressive dyspnea over approximately 4-5 months, such that previously easy tasks like walking > 19065m, shopping, have become difficult. She has needed to rest after about 14065m, can't walk up hills. She has NOT heard wheeze, experienced CP or cough either at rest or with exertion. Her daughter has not heard any wheeze or upper airway noise. A review of Cardiology notes reveals that she has had SOB to a lesser degree farther back than Fall 2012 >> clonidine 0.1mg  was added to HTN regimen 5/25 but had to be stopped 6/8 due to symptomatic (dyspneic) bradycardia. She initially rebounded off the clonidine, but then experienced more SOB despite improvement in volume status and mild edema.    The subsequent evaluation included Stress TTE 12/14 showed low exercise tolerance but a small change in gradient w exertion:mLVOT gradient of 9mmHg at rest, 14mmHg w exertion; peakLVOT gradient 14mmHg at rest, to 23mmHg w exertion.  Unfortunately in late December she had an apparent URI that precipitated sx consistent with an AE-COPD that was treated with abx + prednisone (first and only time this has happened). She states that this illness dragged her down significantly, took several weeks for her to recover, experienced cough, dyspnea, malaise, decreased functional capacity.  PFT done 06/05/11 showed Mild AFL without BD response, normal volumes, decreased DLCO that corrects to normal range for Va.  She experienced a fall 1/17  with impact on her R chest, prompted a CT scan of the chest that showed some very mild basilar changes that could suggest interstitial process, but these are very subtle and could reflect atx or old mild scar. Based on her continued and now progressive dyspnea, and on coronary calcifications seen on chest CT, she had L and R heart cath on 06/26/11. I do not have the L heart data available but the patient tells me that her CAD has not progressed significantly compared with 2008 study. The R heart study showed no significant gradient across the AV, LVEDP 24, CO Fick 5.4 and Thermodil 4.0, mean PAOP between 20 - 26, RVP 53/5, PAP 50/20 (35). PVR 1.7 (Fick) and 2.3 (Thermodil).  She was evaluated by Dr Tenny Crawoss 2/18 and noted to be hypoxemic with ambulation.  ROV 08/08/11 -- Pleasant 80 yo woman w A fib, diastolic heart dz, MV disease and secondary PAH. 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 Last time we started O2 with exertion, trial xopenex. Plan to schedule PSG as a possible contributor to secondary PAH. BP was high today, 140/90. She has been using the O2 at home, but only on reserve when she is out of the house. She has used the xopenex prior to significant exertion, thinks it may have helped her some.   ROV 10/11/11 -- A fib, diastolic heart dz, MV disease and secondary PAH. 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 PSG showed OSA, CPAP titration ordered.  She is getting over a URI over the last 3 weeks. Overall she may feel better. She is wearing her  oxygen with some exertion, not all.   ROV 01/10/12 -- A fib, diastolic heart dz, MV disease and secondary PAH. 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 PSG showed OSA > started CPAP and believes she sleeps better. She feels that her breathing is better since last time. Not really sure what has changed  - she didn't notice a difference on the SABA. Anemia workup has shown no source GI bleeding. She is having some vision changes - blurred vision when she gets up in the am.   ROV 03/25/12 --  Pleasant 80 yo woman, hx of A fib, diastolic heart dz, MV disease and secondary PAH. Also a former smoker w mild AFL on spirometry.  Since last time she had repeat TEE 01/31/12 by Dr Tenny Craw >> no real change in her MV or her HOCM. She returns today and reports some pain in her upper quadrants vs her upper ribs, about 9 lbs wt loss (unintentional) since last visit, more DOE. She has been going to PT, but recently stopped it because as the workload increased she had more SOB, had documented hypoxemia. She has been dealing with chronic intermittent diarrhea, has a workup underway w Dr Renne Crigler. Her other chronic sx include frequent HA's, some imbalance (she has fallen 3 times in the last year!). She never had a clinical response to SABA prn. She remains on amiodarone.   ROV 01/07/13 -- 80, secondary PAH due to diastolic fxn/HOCM, MV disease and decreased fxn when she is in A Fib. We have also identified OSA and exertional hypoxemia as contributors. She has been having more trouble, more dyspnea and chest pressure since July when she went back into A fib. She wants a Designer, jewellery, is interested in starting to wear it more reliably.   ROV 09/10/13 -- this is a f/u visit for secondary PAH due to diastolic fxn/HOCM, MV disease and decreased fxn when she is in A Fib, OSA and chronic hypoxemia. She has been in SNF for rehab since hospitalization following fall in early April. She is stronger, improving. Has had difficulty getting her CPAP correctly - the CNA's at SNF have not been familiar w the device. Was taken to ED for tachycardia and labile SpO2 recently.      Objective:   Physical Exam Filed Vitals:   09/10/13 1555  BP: 130/84  Pulse: 81  Height: 5\' 4"  (1.626 m)  Weight: 160 lb (72.576 kg)  SpO2: 90%    Gen: Pleasant, well-nourished, slightly overweight, mild kyphosis, in no distress,  normal affect  ENT: No lesions,  mouth clear,  oropharynx clear, no postnasal drip, Mal Class 4 airway  Neck: No  JVD, no TMG, no carotid bruits  Lungs: No use of accessory muscles, no dullness to percussion, clear without rales or rhonchi, no wheeze w normal breath or on forced exp  Cardiovascular: RRR, heart sounds normal, holosystolic M with intact mechanical S2, no LE edema  Musculoskeletal: No deformities, no cyanosis or clubbing, scar L wrist, intact pulses  Neuro: alert, non focal  Skin: Warm, no lesions or rashes, normal cap refill    TEE 01/31/12 --  Indications: Mitral regurgitation 424.0. Transesophageal echocardiography. 2D and color Doppler. Height: Height: 162.6cm. Height: 64in. Weight: Weight: 83.5kg. Weight: 183.7lb. Body mass index: BMI: 31.6kg/m^2. Body surface area: BSA: 1.69m^2. Blood pressure: 179/73. Patient status: Outpatient. Location: Endoscopy. ------------------------------------------------------------ Left ventricle: LV is severely thickened, especially interventricular septum. LV cavity is small. IT is difficult to image LVOT because of reverberation artifact LVEF is  normal. ------------------------------------------------------------ Aortic valve: AV is thickened, calcified. There is mildly restricted motion. Mild insufficiency. ------------------------------------------------------------ Aorta: Minimal plaquing of thoracic aorta. ------------------------------------------------------------ Mitral valve: s/p MV repair. MV is mildly thickened. There is mildly restricted motion from the repair. Peak and mean gradients through the valve are 31 and 10 mm Hg respectively. MVA by Pt1/2 is 1.7 cm2. There is mild MR predominantly from 2 jets, one being perivalvular. Doppler: Mean gradient: 9mm Hg (D). Peak gradient: 28mm Hg (D). ------------------------------------------------------------ Right ventricle: RV is normal in size with normal function. ------------------------------------------------------------ Pulmonic valve: PV is  normal. ------------------------------------------------------------ Tricuspid valve: TV is normal. Trace TR. ----------------------------------------------------------- Post procedure conclusions Ascending Aorta: - Minimal plaquing of thoracic aorta.     Assessment & Plan:  Chronic respiratory failure Stop CPAP for now. We will use [place] oxygen at 4L/min at night Follow with Dr Tenny Crawoss 4/24 as planned Follow with Dr Delton CoombesByrum in 4-6 weeks

## 2013-09-11 ENCOUNTER — Encounter: Payer: Self-pay | Admitting: Internal Medicine

## 2013-09-11 ENCOUNTER — Ambulatory Visit (INDEPENDENT_AMBULATORY_CARE_PROVIDER_SITE_OTHER): Payer: Medicare Other | Admitting: Internal Medicine

## 2013-09-11 VITALS — BP 112/73 | HR 84 | Ht 64.0 in | Wt 161.0 lb

## 2013-09-11 DIAGNOSIS — I4891 Unspecified atrial fibrillation: Secondary | ICD-10-CM

## 2013-09-11 LAB — BASIC METABOLIC PANEL WITH GFR
BUN: 24 mg/dL — ABNORMAL HIGH (ref 6–23)
CHLORIDE: 101 meq/L (ref 96–112)
CO2: 29 meq/L (ref 19–32)
Calcium: 8.7 mg/dL (ref 8.4–10.5)
Creat: 0.83 mg/dL (ref 0.50–1.10)
GFR, Est African American: 75 mL/min
GFR, Est Non African American: 65 mL/min
GLUCOSE: 104 mg/dL — AB (ref 70–99)
Potassium: 3.8 mEq/L (ref 3.5–5.3)
SODIUM: 140 meq/L (ref 135–145)

## 2013-09-11 LAB — CBC
HEMATOCRIT: 32.2 % — AB (ref 36.0–46.0)
HEMOGLOBIN: 10.3 g/dL — AB (ref 12.0–15.0)
MCH: 24.6 pg — AB (ref 26.0–34.0)
MCHC: 32 g/dL (ref 30.0–36.0)
MCV: 77 fL — AB (ref 78.0–100.0)
Platelets: 210 10*3/uL (ref 150–400)
RBC: 4.18 MIL/uL (ref 3.87–5.11)
RDW: 20 % — ABNORMAL HIGH (ref 11.5–15.5)
WBC: 9.7 10*3/uL (ref 4.0–10.5)

## 2013-09-11 LAB — BRAIN NATRIURETIC PEPTIDE: Brain Natriuretic Peptide: 745.7 pg/mL — ABNORMAL HIGH (ref 0.0–100.0)

## 2013-09-11 NOTE — Progress Notes (Signed)
HPI Patient is an 80 yo with a history of HOCM, atrial fib, mitral stenosis s/p MVR, mild CAD, secondary pulm HTN.  I saw her earlier this year.  She was admitted in early April after a fall  She hit her head and back.  W/U was signif for finding a Type B aortic dissection with aorta measuring 6.4 cm.  Coumadin was stopped.  Meds adjusted because of low normal BP. Since dc she was seen by Leda GauzeM Lenze  [facility] has [facility]. She went to ER on 4/19 because of increased SOB  Found to have pulm edema.  Given IV lasix and lasix increased as outpatient  In afib with rapid rates at time.   Breathing is fair  No CP  Back pain improved.     Allergies  Allergen Reactions  . Ivp Dye [Iodinated Diagnostic Agents] Itching  . Nitrofurantoin Itching  . Ramipril Other (See Comments)    unknown    Current Outpatient Prescriptions  Medication Sig Dispense Refill  . amiodarone (PACERONE) 200 MG tablet Take 100 mg by mouth daily.      . Calcium Carbonate-Vitamin D (CALCIUM 500 + D PO) Take 1 tablet by mouth daily.      . cholecalciferol (VITAMIN D) 1000 UNITS tablet Take 1,000 Units by mouth at bedtime.       . Coenzyme Q10 (COQ10 PO) Take 300 mg by mouth daily.      . Flaxseed, Linseed, (TH FLAX SEED OIL PO) Take 1 capsule by mouth daily.      . furosemide (LASIX) 20 MG tablet Take 20 mg by mouth 3 (three) times a week. Monday, Wednesday and friday      . furosemide (LASIX) 20 MG tablet Take 1 tablet (20 mg total) by mouth daily.  5 tablet  0  . levothyroxine (SYNTHROID, LEVOTHROID) 125 MCG tablet Take 62.5 mcg by mouth daily before breakfast.      . losartan (COZAAR) 100 MG tablet Take 100 mg by mouth daily.       . nitroGLYCERIN (NITROSTAT) 0.4 MG SL tablet Place 0.4 mg under the tongue every 5 (five) minutes as needed for chest pain.      [name]. oxyCODONE-acetaminophen (PERCOCET/ROXICET) 5-325 MG per tablet Take 1 tablet by mouth every 6 (six) hours as needed for moderate pain or severe pain.  30  tablet  0  . polyethylene glycol (MIRALAX / GLYCOLAX) packet Take 17 g by mouth daily as needed for mild constipation.       . Probiotic Product (PROBIOTIC DAILY PO) Take 1 tablet by mouth daily.      . sertraline (ZOLOFT) 100 MG tablet Take 100 mg by mouth daily.       No current facility-administered medications for this visit.    Past Medical History  Diagnosis Date  . CAD (coronary artery disease)   . Mitral valve disorder     a. s/p MVR;  b. 06/2013 Echo: EF 55-60%, no rwma, Ao sclerosis, mild AI, mild MR, miod dil LA, sev dil RV with reduced fxn, sev dil RA, mod TR/PR, PASP 60mmHg (study reviewed by Dr. Verlin FesterKN- MS felt to be sev, peak grad 39/mean 13).  [name]. HTN (hypertension)   . Dyslipidemia   . Aortic stenosis, mild   . Breast cyst   . Hypertrophic cardiomyopathy   . Sleep apnea   . COPD (chronic obstructive pulmonary disease)     "a touch of"; prn o2 at home with activity   .  Atrial fibrillation     a. chronic coumadin.  [name]. Hypothyroidism   . Anemia   . Chronic right-sided heart failure     Past Surgical History  Procedure Laterality Date  . Laparoscopic cholecystectomy  2009  . Cataract extraction  2003    bilateral  . Mitral valve replacement  2007  . Neck mass excision  2003    benign  . Colonoscopy    . Broken wrist      left, metal implanted   . Broken ankle      right, metal implanted   . Cardiac catheterization  06/2011  . Breast surgery      bilateral cysts x3 removed, benign   . Tee without cardioversion  01/31/2012    Procedure: TRANSESOPHAGEAL ECHOCARDIOGRAM (TEE);  Surgeon: Pricilla RifflePaula V Junette Bernat, MD;  [facility];  Service: Cardiovascular;  Laterality: N/A;  . Cardioversion N/A 01/16/2013    Procedure: CARDIOVERSION;  Surgeon: Vesta MixerPhilip J Nahser, MD;  [facility];  Service: Cardiovascular;  Laterality: N/A;    Family History  Problem Relation Age of Onset  . Diabetes Mother   . CVA Mother   . Other Father   . Diabetes Brother   . Cancer  Brother     throat ca, smoker  . Aneurysm Brother     brain aneurysm  . Breast cancer Sister   . Emphysema Sister     smoker    History   Social History  . Marital Status: Married    Spouse Name: N/A    Number of Children: N/A  . Years of Education: N/A   Occupational History  . Retired    Social History Main Topics  . Smoking status: Former Smoker -- 1.00 packs/day for 40 years    Types: Cigarettes    Quit date: 05/21/1990  . Smokeless tobacco: Never Used  . Alcohol Use: No  . Drug Use: No  . Sexual Activity: No   Other Topics Concern  . Not on file   Social History Narrative   Lives with her husband.  Drives and walks around without assist device.            Review of Systems:  All systems reviewed.  They are negative to the above problem except as previously stated.  Vital Signs: BP 112/73  Pulse 84  Ht 5\' 4"  (1.626 m)  Wt 161 lb (73.029 kg)  BMI 27.62 kg/m2  Physical Exam Patient is in NAD HEENT:  Normocephalic, atraumatic. EOMI, PERRLA.  Neck: JVP is normal.  No bruits.  Lungs: clear to auscultation. No rales no wheezes.  Heart: Irregular rate and rhythm. Normal S1, S2. No S3.   No significant murmurs. PMI not displaced.  Abdomen:  Supple, nontender. Normal bowel sounds. No masses. No hepatomegaly.  Extremities:   Good distal pulses throughout. No lower extremity edema.  Musculoskeletal :moving all extremities.  Neuro:   alert and oriented x3.  CN II-XII grossly intact.  EKG Atrial fibrillation  84 bpm  LVH  ST T wave changes consistent with strain, cannot exclude ischemia.   Assessment and Plan:  1.  Atrial fib  This is probably exacerbating her volume overload  Unfort, she is not a good candidate for coumadin with falls and dissection.  Would recomm rate control   2.  Acute on chronic diastolic CHF  As above  Will check labs  Vol does not appear to be too bad today  3.  MV disease.  S/p MVR  With mitral stenosis.  She is not a candidate for  intervention  4.  HOCM  5.  Dissecting aortic aneurysm.  Will review all records  Patient has f/u in vasc surgery  Patient to continue to be followed by hospice/palliative care once back at home.

## 2013-09-11 NOTE — Patient Instructions (Addendum)
Your physician has recommended that you wear a 24 hour holter monitor. Holter monitors are medical devices that record the heart's electrical activity. Doctors most often use these monitors to diagnose arrhythmias. Arrhythmias are problems with the speed or rhythm of the heartbeat. The monitor is a small, portable device. You can wear one while you do your normal daily activities. This is usually used to diagnose what is causing palpitations/syncope (passing out).   Your physician recommends that you HAVE LAB WORK TODAY

## 2013-09-15 ENCOUNTER — Encounter: Payer: Self-pay | Admitting: *Deleted

## 2013-09-15 ENCOUNTER — Encounter (INDEPENDENT_AMBULATORY_CARE_PROVIDER_SITE_OTHER): Payer: Medicare Other

## 2013-09-15 DIAGNOSIS — I4891 Unspecified atrial fibrillation: Secondary | ICD-10-CM

## 2013-09-15 NOTE — Progress Notes (Signed)
Patient ID: Patricia [name], female   DOB: June 22, 1926, 80 y.o.   MRN: [id number] EVO 48 hour holter monitor( to run 24 hours) given to grand daughter Cloyde Reamsrika Johnson.  She will apply the 24 hour holter monitor to her grandmother in the nursing home.

## 2013-09-16 ENCOUNTER — Other Ambulatory Visit: Payer: Self-pay | Admitting: *Deleted

## 2013-09-16 ENCOUNTER — Telehealth: Payer: Self-pay | Admitting: *Deleted

## 2013-09-16 DIAGNOSIS — Z01810 Encounter for preprocedural cardiovascular examination: Secondary | ICD-10-CM

## 2013-09-16 DIAGNOSIS — I7101 Dissection of thoracic aorta: Secondary | ICD-10-CM

## 2013-09-16 DIAGNOSIS — I71019 Dissection of thoracic aorta, unspecified: Secondary | ICD-10-CM

## 2013-09-16 DIAGNOSIS — T508X5A Adverse effect of diagnostic agents, initial encounter: Secondary | ICD-10-CM

## 2013-09-16 MED ORDER — PREDNISONE 50 MG PO TABS: ORAL_TABLET | ORAL | Status: DC

## 2013-09-16 MED ORDER — DIPHENHYDRAMINE HCL 50 MG PO CAPS: ORAL_CAPSULE | ORAL | Status: DC

## 2013-09-16 NOTE — Telephone Encounter (Signed)
Patient is to have a CTA Chest per Dr. Estanislado SpireBrabham's orders. She is allergic to contrast dye so I faxed these protocol meds to [facility].

## 2013-09-18 ENCOUNTER — Telehealth: Payer: Self-pay

## 2013-09-18 NOTE — Telephone Encounter (Signed)
Please clarify what dosage of Lasix pt should presently be taking.  Pt was given 20mg  QD x 5 days, but was previously on 20mg  MWF.  Please advise.  Thanks

## 2013-09-18 NOTE — Telephone Encounter (Signed)
Per [facility] nursing facility pt is currently getting Furosemide 20mg  daily. Is this the correct dosage pt should stay on?

## 2013-09-20 NOTE — Telephone Encounter (Signed)
Yes Will get labs as outpatient .  Discussed with Granddaughter Alcario DroughtErica

## 2013-09-21 ENCOUNTER — Telehealth: Payer: Self-pay | Admitting: *Deleted

## 2013-09-21 MED ORDER — METOPROLOL TARTRATE 25 MG PO TABS: 25.0000 mg | ORAL_TABLET | Freq: Two times a day (BID) | ORAL | Status: AC

## 2013-09-21 NOTE — Telephone Encounter (Signed)
Monitor reviewed by dr Tenny Crawross shows atrial fib 81 to 127 bpm Average hr= 99 bpm  Rec: decreasing cozaar to 50 mg once daily Add metoprolol tart 25 mg bid Hospice to follow bp and pulse Hospice also to get bmp and bnp   Also discussed with dtr teresa She reports the pt is no longer on the cozaar due to low bp She will start the metoprolol

## 2013-09-22 ENCOUNTER — Ambulatory Visit: Payer: Medicare Other | Admitting: Vascular Surgery

## 2013-09-22 ENCOUNTER — Other Ambulatory Visit: Payer: Medicare Other

## 2013-09-24 ENCOUNTER — Ambulatory Visit (INDEPENDENT_AMBULATORY_CARE_PROVIDER_SITE_OTHER): Payer: Medicare Other | Admitting: *Deleted

## 2013-09-24 DIAGNOSIS — R7989 Other specified abnormal findings of blood chemistry: Secondary | ICD-10-CM

## 2013-09-24 DIAGNOSIS — R799 Abnormal finding of blood chemistry, unspecified: Secondary | ICD-10-CM | POA: Diagnosis not present

## 2013-09-24 DIAGNOSIS — D619 Aplastic anemia, unspecified: Secondary | ICD-10-CM | POA: Diagnosis not present

## 2013-09-24 LAB — CBC WITH DIFFERENTIAL/PLATELET
BASOS ABS: 0 10*3/uL (ref 0.0–0.1)
Basophils Relative: 0.2 % (ref 0.0–3.0)
Eosinophils Absolute: 0.2 10*3/uL (ref 0.0–0.7)
Eosinophils Relative: 2 % (ref 0.0–5.0)
HEMATOCRIT: 33.6 % — AB (ref 36.0–46.0)
Hemoglobin: 10.6 g/dL — ABNORMAL LOW (ref 12.0–15.0)
LYMPHS ABS: 0.6 10*3/uL — AB (ref 0.7–4.0)
Lymphocytes Relative: 7.1 % — ABNORMAL LOW (ref 12.0–46.0)
MCHC: 31.5 g/dL (ref 30.0–36.0)
MCV: 79.3 fl (ref 78.0–100.0)
MONO ABS: 0.6 10*3/uL (ref 0.1–1.0)
MONOS PCT: 6.6 % (ref 3.0–12.0)
Neutro Abs: 7.5 10*3/uL (ref 1.4–7.7)
Neutrophils Relative %: 84.1 % — ABNORMAL HIGH (ref 43.0–77.0)
Platelets: 149 10*3/uL — ABNORMAL LOW (ref 150.0–400.0)
RBC: 4.23 Mil/uL (ref 3.87–5.11)
RDW: 21.8 % — AB (ref 11.5–15.5)
WBC: 8.9 10*3/uL (ref 4.0–10.5)

## 2013-09-24 LAB — BASIC METABOLIC PANEL
BUN: 17 mg/dL (ref 6–23)
CHLORIDE: 106 meq/L (ref 96–112)
CO2: 26 meq/L (ref 19–32)
Calcium: 8.6 mg/dL (ref 8.4–10.5)
Creatinine, Ser: 0.8 mg/dL (ref 0.4–1.2)
GFR: 72.56 mL/min (ref >60.00)
Glucose, Bld: 96 mg/dL (ref 70–99)
POTASSIUM: 3.3 meq/L — AB (ref 3.5–5.1)
Sodium: 139 mEq/L (ref 135–145)

## 2013-09-24 LAB — IBC PANEL
Iron: 31 ug/dL — ABNORMAL LOW (ref 42–145)
Saturation Ratios: 7.9 % — ABNORMAL LOW (ref 20.0–50.0)
Transferrin: 281.9 mg/dL (ref 212.0–360.0)

## 2013-09-24 LAB — BRAIN NATRIURETIC PEPTIDE: Pro B Natriuretic peptide (BNP): 1238 pg/mL — ABNORMAL HIGH (ref 0.0–100.0)

## 2013-09-24 LAB — IRON: Iron: 31 ug/dL — ABNORMAL LOW (ref 42–145)

## 2013-09-28 ENCOUNTER — Other Ambulatory Visit: Payer: Medicare Other

## 2013-09-28 ENCOUNTER — Ambulatory Visit: Payer: Medicare Other | Admitting: Surgery

## 2013-09-29 ENCOUNTER — Ambulatory Visit (INDEPENDENT_AMBULATORY_CARE_PROVIDER_SITE_OTHER): Payer: Medicare Other | Admitting: Internal Medicine

## 2013-09-29 ENCOUNTER — Encounter: Payer: Self-pay | Admitting: Internal Medicine

## 2013-09-29 VITALS — BP 112/68 | HR 78 | Ht 64.0 in | Wt 153.0 lb

## 2013-09-29 DIAGNOSIS — I2789 Other specified pulmonary heart diseases: Secondary | ICD-10-CM

## 2013-09-29 DIAGNOSIS — I4891 Unspecified atrial fibrillation: Secondary | ICD-10-CM

## 2013-09-29 DIAGNOSIS — I050 Rheumatic mitral stenosis: Secondary | ICD-10-CM

## 2013-09-29 DIAGNOSIS — I421 Obstructive hypertrophic cardiomyopathy: Secondary | ICD-10-CM

## 2013-09-29 DIAGNOSIS — I7100 Dissection of unspecified site of aorta: Secondary | ICD-10-CM

## 2013-09-29 DIAGNOSIS — I272 Pulmonary hypertension, unspecified: Secondary | ICD-10-CM

## 2013-09-29 NOTE — Progress Notes (Signed)
HPI Patient is an 80 yo with a history of HOCM, atrial fib, mitral stenosis s/p MVR, mild CAD, secondary pulm HTN and Type B aortic dissection  I saw her seeral weeks ago. Since I saw her [place] is back [place] [facility]  I recomm that she start metoprolol 25 bid as holter monitor showed average daily rate of 99 bpm The patient denies dizziness  No CP  Breathing is steady     Allergies  Allergen Reactions  . Ivp Dye [Iodinated Diagnostic Agents] Itching  . Nitrofurantoin Itching  . Ramipril Other (See Comments)    unknown    Current Outpatient Prescriptions  Medication Sig Dispense Refill  . amiodarone (PACERONE) 200 MG tablet Take 100 mg by mouth daily.      . Calcium Carbonate-Vitamin D (CALCIUM 500 + D PO) Take 1 tablet by mouth daily.      . cholecalciferol (VITAMIN D) 1000 UNITS tablet Take 1,000 Units by mouth at bedtime.       . Coenzyme Q10 (COQ10 PO) Take 300 mg by mouth daily.      . Flaxseed, Linseed, (TH FLAX SEED OIL PO) Take 1 capsule by mouth daily.      . furosemide (LASIX) 20 MG tablet Take 1 tablet (20 mg total) by mouth daily.  5 tablet  0  . Glucosamine HCl (GLUCOSAMINE PO) Take by mouth daily.      [name]. levothyroxine (SYNTHROID, LEVOTHROID) 125 MCG tablet Take 62.5 mcg by mouth daily before breakfast.      . metoprolol tartrate (LOPRESSOR) 25 MG tablet Take 1 tablet (25 mg total) by mouth 2 (two) times daily.  60 tablet  12  . nitroGLYCERIN (NITROSTAT) 0.4 MG SL tablet Place 0.4 mg under the tongue every 5 (five) minutes as needed for chest pain.      [name]. oxyCODONE-acetaminophen (PERCOCET/ROXICET) 5-325 MG per tablet Take 1 tablet by mouth every 6 (six) hours as needed for moderate pain or severe pain.  30 tablet  0  . polyethylene glycol (MIRALAX / GLYCOLAX) packet Take 17 g by mouth daily as needed for mild constipation.       . potassium chloride SA (K-DUR,KLOR-CON) 20 MEQ tablet Take 20 mEq by mouth daily.      . Probiotic Product (PROBIOTIC DAILY PO) Take 1 tablet  by mouth daily.      . sertraline (ZOLOFT) 100 MG tablet Take 100 mg by mouth daily.       No current facility-administered medications for this visit.    Past Medical History  Diagnosis Date  . CAD (coronary artery disease)   . Mitral valve disorder     a. s/p MVR;  b. 06/2013 Echo: EF 55-60%, no rwma, Ao sclerosis, mild AI, mild MR, miod dil LA, sev dil RV with reduced fxn, sev dil RA, mod TR/PR, PASP 60mmHg (study reviewed by Dr. Verlin FesterKN- MS felt to be sev, peak grad 39/mean 13).  [name]. HTN (hypertension)   . Dyslipidemia   . Aortic stenosis, mild   . Breast cyst   . Hypertrophic cardiomyopathy   . Sleep apnea   . COPD (chronic obstructive pulmonary disease)     "a touch of"; prn o2 at home with activity   . Atrial fibrillation     a. chronic coumadin.  [name]. Hypothyroidism   . Anemia   . Chronic right-sided heart failure     Past Surgical History  Procedure Laterality Date  . Laparoscopic cholecystectomy  2009  . Cataract  extraction  2003    bilateral  . Mitral valve replacement  2007  . Neck mass excision  2003    benign  . Colonoscopy    . Broken wrist      left, metal implanted   . Broken ankle      right, metal implanted   . Cardiac catheterization  06/2011  . Breast surgery      bilateral cysts x3 removed, benign   . Tee without cardioversion  01/31/2012    Procedure: TRANSESOPHAGEAL ECHOCARDIOGRAM (TEE);  Surgeon: Pricilla Riffle, MD;  Location[facility] ENDOSCOPY;  Service: Cardiovascular;  Laterality: N/A;  . Cardioversion N/A 01/16/2013    Procedure: CARDIOVERSION;  Surgeon: Vesta Mixer, MD;  Location: [facility] ENDOSCOPY;  Service: Cardiovascular;  Laterality: N/A;    Family History  Problem Relation Age of Onset  . Diabetes Mother   . CVA Mother   . Other Father   . Diabetes Brother   . Cancer Brother     throat ca, smoker  . Aneurysm Brother     brain aneurysm  . Breast cancer Sister   . Emphysema Sister     smoker    History   Social History  . Marital Status:  Married    Spouse Name: N/A    Number of Children: N/A  . Years of Education: N/A   Occupational History  . Retired    Social History Main Topics  . Smoking status: Former Smoker -- 1.00 packs/day for 40 years    Types: Cigarettes    Quit date: 05/21/1990  . Smokeless tobacco: Never Used  . Alcohol Use: No  . Drug Use: No  . Sexual Activity: No   Other Topics Concern  . Not on file   Social History Narrative   Lives with her husband.  Drives and walks around without assist device.            Review of Systems:  All systems reviewed.  They are negative to the above problem except as previously stated.  Vital Signs: BP 112/68  Pulse 78  Ht 5\' 4"  (1.626 m)  Wt 153 lb (69.4 kg)  BMI 26.25 kg/m2  Physical Exam Patient is in NAD HEENT:  Normocephalic, atraumatic. EOMI, PERRLA.  Neck: JVP is normal.  No bruits.  Lungs: clear to auscultation. No rales no wheezes.  Heart: Irregular rate and rhythm. Normal S1, S2. No S3.   No significant murmurs. PMI not displaced.  Abdomen:  Supple, nontender. Normal bowel sounds. No masses. No hepatomegaly.  Extremities:   Good distal pulses throughout. No lower extremity edema.  Musculoskeletal :moving all extremities.  Neuro:   alert and oriented x3.  CN II-XII grossly intact.   Assessment and Plan:  1.  Atrial fib  Rates are better with a little metoprolol  Daughter brings up SE of fatigue  I would follow for now to see how does before switching to Ca blocker.  I don't want problems with hypotension with once daily dosing  Could consider short acting but more inconvenient (3-4x daily) Not on anticoagulation  2.  Acute on chronic diastolic CHF Volume status looks pretty good.  WIll get BMET drawn next wk.    3.  MV disease.  S/p MVR  With mitral stenosis.  She is not a candidate for intervention  4.  HOCM  Cont current meds  5.  Dissecting aortic aneurysm.   Patient to continue to be followed by hospice/palliative care.   Follow  up will be based on how she does at home .

## 2013-09-29 NOTE — Patient Instructions (Signed)
Your physician recommends that you schedule a follow-up appointment in: TBD

## 2013-10-05 ENCOUNTER — Telehealth: Payer: Self-pay | Admitting: Internal Medicine

## 2013-10-05 NOTE — Telephone Encounter (Signed)
Office is closed at present

## 2013-10-05 NOTE — Telephone Encounter (Signed)
Follow up     [facility]. Needs a faxed # [phone] order for potasium please.

## 2013-10-06 ENCOUNTER — Other Ambulatory Visit: Payer: Self-pay

## 2013-10-06 ENCOUNTER — Encounter: Payer: Self-pay | Admitting: Internal Medicine

## 2013-10-06 LAB — POTASSIUM: Potassium: 3.8 mmol/L (ref 3.5–5.1)

## 2013-10-07 NOTE — Telephone Encounter (Signed)
[facility]oke with [facility], they need a script for the potassium faxed to the number provided Left message for pt dtr to call to confirm dosage

## 2013-10-09 MED ORDER — POTASSIUM CHLORIDE CRYS ER 20 MEQ PO TBCR: 20.0000 meq | EXTENDED_RELEASE_TABLET | Freq: Every day | ORAL | Status: DC

## 2013-10-09 NOTE — Telephone Encounter (Signed)
Spoke with pt, potassium is 20 meq once daily Will fax script to hospice

## 2013-10-22 ENCOUNTER — Ambulatory Visit: Payer: Medicare Other | Admitting: Emergency Medicine

## 2013-10-30 ENCOUNTER — Other Ambulatory Visit: Payer: Self-pay

## 2013-10-30 ENCOUNTER — Encounter: Payer: Self-pay | Admitting: Internal Medicine

## 2013-10-30 LAB — BASIC METABOLIC PANEL
Anion Gap: 4 — ABNORMAL LOW (ref 7–16)
BUN: 15 mg/dL (ref 7–18)
CO2: 32 mmol/L (ref 21–32)
Calcium, Total: 8.4 mg/dL — ABNORMAL LOW (ref 8.5–10.1)
Chloride: 101 mmol/L (ref 98–107)
Creatinine: 0.77 mg/dL (ref 0.60–1.30)
EGFR (African American): >60
EGFR (Non-African Amer.): >60
Glucose: 87 mg/dL (ref 65–99)
OSMOLALITY: 274 (ref 275–301)
POTASSIUM: 4 mmol/L (ref 3.5–5.1)
SODIUM: 137 mmol/L (ref 136–145)

## 2013-11-09 ENCOUNTER — Other Ambulatory Visit: Payer: Self-pay | Admitting: *Deleted

## 2013-11-09 MED ORDER — FUROSEMIDE 20 MG PO TABS: 20.0000 mg | ORAL_TABLET | Freq: Every day | ORAL | Status: DC

## 2013-11-24 ENCOUNTER — Encounter: Payer: Self-pay | Admitting: Internal Medicine

## 2014-01-09 ENCOUNTER — Other Ambulatory Visit: Payer: Self-pay | Admitting: Internal Medicine

## 2014-01-29 ENCOUNTER — Other Ambulatory Visit: Payer: Self-pay | Admitting: Emergency Medicine

## 2014-02-02 ENCOUNTER — Encounter: Payer: Self-pay | Admitting: Cardiovascular Disease

## 2014-02-12 ENCOUNTER — Emergency Department (HOSPITAL_COMMUNITY): Payer: Medicare Other

## 2014-02-12 ENCOUNTER — Emergency Department (HOSPITAL_COMMUNITY)
Admission: EM | Admit: 2014-02-12 | Discharge: 2014-02-12 | Disposition: A | Discharge status: Home / Self Care | Payer: Medicare Other | Attending: Emergency Medicine | Admitting: Emergency Medicine

## 2014-02-12 ENCOUNTER — Encounter (HOSPITAL_COMMUNITY): Payer: Self-pay | Admitting: Emergency Medicine

## 2014-02-12 DIAGNOSIS — Y9289 Other specified places as the place of occurrence of the external cause: Secondary | ICD-10-CM | POA: Insufficient documentation

## 2014-02-12 DIAGNOSIS — I2510 Atherosclerotic heart disease of native coronary artery without angina pectoris: Secondary | ICD-10-CM | POA: Insufficient documentation

## 2014-02-12 DIAGNOSIS — S0230XA Fracture of orbital floor, unspecified side, initial encounter for closed fracture: Secondary | ICD-10-CM | POA: Insufficient documentation

## 2014-02-12 DIAGNOSIS — Z79899 Other long term (current) drug therapy: Secondary | ICD-10-CM | POA: Insufficient documentation

## 2014-02-12 DIAGNOSIS — Z87891 Personal history of nicotine dependence: Secondary | ICD-10-CM | POA: Insufficient documentation

## 2014-02-12 DIAGNOSIS — S023XXA Fracture of orbital floor, initial encounter for closed fracture: Secondary | ICD-10-CM

## 2014-02-12 DIAGNOSIS — S01409A Unspecified open wound of unspecified cheek and temporomandibular area, initial encounter: Secondary | ICD-10-CM | POA: Insufficient documentation

## 2014-02-12 DIAGNOSIS — I509 Heart failure, unspecified: Secondary | ICD-10-CM | POA: Insufficient documentation

## 2014-02-12 DIAGNOSIS — Z8673 Personal history of transient ischemic attack (TIA), and cerebral infarction without residual deficits: Secondary | ICD-10-CM | POA: Insufficient documentation

## 2014-02-12 DIAGNOSIS — Z9889 Other specified postprocedural states: Secondary | ICD-10-CM | POA: Insufficient documentation

## 2014-02-12 DIAGNOSIS — J449 Chronic obstructive pulmonary disease, unspecified: Secondary | ICD-10-CM | POA: Insufficient documentation

## 2014-02-12 DIAGNOSIS — Z8742 Personal history of other diseases of the female genital tract: Secondary | ICD-10-CM | POA: Insufficient documentation

## 2014-02-12 DIAGNOSIS — J4489 Other specified chronic obstructive pulmonary disease: Secondary | ICD-10-CM | POA: Insufficient documentation

## 2014-02-12 DIAGNOSIS — Z792 Long term (current) use of antibiotics: Secondary | ICD-10-CM | POA: Insufficient documentation

## 2014-02-12 DIAGNOSIS — S0990XA Unspecified injury of head, initial encounter: Secondary | ICD-10-CM | POA: Insufficient documentation

## 2014-02-12 DIAGNOSIS — I4891 Unspecified atrial fibrillation: Secondary | ICD-10-CM | POA: Insufficient documentation

## 2014-02-12 DIAGNOSIS — S01411A Laceration without foreign body of right cheek and temporomandibular area, initial encounter: Secondary | ICD-10-CM

## 2014-02-12 DIAGNOSIS — I10 Essential (primary) hypertension: Secondary | ICD-10-CM | POA: Insufficient documentation

## 2014-02-12 DIAGNOSIS — E039 Hypothyroidism, unspecified: Secondary | ICD-10-CM | POA: Insufficient documentation

## 2014-02-12 DIAGNOSIS — Y9389 Activity, other specified: Secondary | ICD-10-CM | POA: Insufficient documentation

## 2014-02-12 DIAGNOSIS — D649 Anemia, unspecified: Secondary | ICD-10-CM | POA: Insufficient documentation

## 2014-02-12 DIAGNOSIS — W1809XA Striking against other object with subsequent fall, initial encounter: Secondary | ICD-10-CM | POA: Insufficient documentation

## 2014-02-12 LAB — CBC
HCT: 38.1 % (ref 36.0–46.0)
Hemoglobin: 11.8 g/dL — ABNORMAL LOW (ref 12.0–15.0)
MCH: 28 pg (ref 26.0–34.0)
MCHC: 31 g/dL (ref 30.0–36.0)
MCV: 90.5 fL (ref 78.0–100.0)
PLATELETS: 104 10*3/uL — AB (ref 150–400)
RBC: 4.21 MIL/uL (ref 3.87–5.11)
RDW: 17 % — AB (ref 11.5–15.5)
WBC: 10.1 10*3/uL (ref 4.0–10.5)

## 2014-02-12 LAB — PROTIME-INR
INR: 1.25 (ref 0.00–1.49)
PROTHROMBIN TIME: 15.7 s — AB (ref 11.6–15.2)

## 2014-02-12 LAB — URINALYSIS, ROUTINE W REFLEX MICROSCOPIC
Bilirubin Urine: NEGATIVE
Glucose, UA: NEGATIVE mg/dL
Hgb urine dipstick: NEGATIVE
Ketones, ur: NEGATIVE mg/dL
LEUKOCYTES UA: NEGATIVE
NITRITE: NEGATIVE
PH: 7 (ref 5.0–8.0)
Protein, ur: NEGATIVE mg/dL
SPECIFIC GRAVITY, URINE: 1.016 (ref 1.005–1.030)
UROBILINOGEN UA: 1 mg/dL (ref 0.0–1.0)

## 2014-02-12 LAB — BASIC METABOLIC PANEL
ANION GAP: 11 (ref 5–15)
BUN: 19 mg/dL (ref 6–23)
CHLORIDE: 103 meq/L (ref 96–112)
CO2: 27 mEq/L (ref 19–32)
Calcium: 8.9 mg/dL (ref 8.4–10.5)
Creatinine, Ser: 0.91 mg/dL (ref 0.50–1.10)
GFR calc non Af Amer: 56 mL/min — ABNORMAL LOW (ref >90)
GFR, EST AFRICAN AMERICAN: 65 mL/min — AB (ref >90)
Glucose, Bld: 101 mg/dL — ABNORMAL HIGH (ref 70–99)
POTASSIUM: 4.2 meq/L (ref 3.7–5.3)
Sodium: 141 mEq/L (ref 137–147)

## 2014-02-12 MED ORDER — ONDANSETRON HCL 4 MG/2ML IJ SOLN
4.0000 mg | Freq: Once | INTRAMUSCULAR | Status: AC
  Administered 2014-02-12: 4 mg via INTRAVENOUS
  Filled 2014-02-12: qty 2

## 2014-02-12 MED ORDER — ONDANSETRON 4 MG PO TBDP
8.0000 mg | ORAL_TABLET | Freq: Once | ORAL | Status: DC
  Filled 2014-02-12: qty 2

## 2014-02-12 MED ORDER — SODIUM CHLORIDE 0.9 % IV BOLUS (SEPSIS)
250.0000 mL | Freq: Once | INTRAVENOUS | Status: AC
  Administered 2014-02-12: 250 mL via INTRAVENOUS

## 2014-02-12 MED ORDER — NAPROXEN 500 MG PO TABS: 500.0000 mg | ORAL_TABLET | Freq: Two times a day (BID) | ORAL | Status: DC

## 2014-02-12 MED ORDER — ACETAMINOPHEN 500 MG PO TABS
1000.0000 mg | ORAL_TABLET | Freq: Once | ORAL | Status: AC
  Administered 2014-02-12: 1000 mg via ORAL
  Filled 2014-02-12: qty 2

## 2014-02-12 MED ORDER — ONDANSETRON 4 MG PO TBDP: 4.0000 mg | ORAL_TABLET | Freq: Three times a day (TID) | ORAL | Status: DC | PRN

## 2014-02-12 NOTE — ED Provider Notes (Signed)
CSN: [id number]     Arrival date & time 02/12/14  1315 History   First MD Initiated Contact with Patient 02/12/14 1319     Chief Complaint  Patient presents with  . Fall     (Consider location/radiation/quality/duration/timing/severity/associated sxs/prior Treatment) HPI Comments: 80 year old female, chronic coronary disease, mitral valve disorder, hypertension, COPD and atrial fibrillation with chronic heart failure. She is on chronic Coumadin, she is also on chronic oxygen therapy due to chronic hypoxia. She presents from her nursing facility after she got tangled up in the oxygen tubing while trying to ambulate, fell down striking her head on the ground and the oxygen tank subsequently fell and struck her in the right side of the face causing a large periorbital hematoma. This occurred just prior to arrival, the symptoms are persistent, no loss of consciousness, no vomiting, no weakness or numbness. The patient was found to be hypoxic without oxygen on arrival, responded to supplemental oxygen without any complaints.  Patient is a 80 y.o. female presenting with fall. The history is provided by the patient.  Fall    Past Medical History  Diagnosis Date  . CAD (coronary artery disease)   . Mitral valve disorder     a. s/p MVR;  b. 06/2013 Echo: EF 55-60%, no rwma, Ao sclerosis, mild AI, mild MR, miod dil LA, sev dil RV with reduced fxn, sev dil RA, mod TR/PR, PASP (study reviewed by Dr. Verlin Fester- MS felt to be sev, peak grad 39/mean 13).  [name] HTN (hypertension)   . Dyslipidemia   . Aortic stenosis, mild   . Breast cyst   . Hypertrophic cardiomyopathy   . Sleep apnea   . COPD (chronic obstructive pulmonary disease)     "a touch of"; prn o2 at home with activity   . Atrial fibrillation     a. chronic coumadin.  [name] Hypothyroidism   . Anemia   . Chronic right-sided heart failure    Past Surgical History  Procedure Laterality Date  . Laparoscopic cholecystectomy  2009  . Cataract  extraction  2003    bilateral  . Mitral valve replacement  2007  . Neck mass excision  2003    benign  . Colonoscopy    . Broken wrist      left, metal implanted   . Broken ankle      right, metal implanted   . Cardiac catheterization  06/2011  . Breast surgery      bilateral cysts x3 removed, benign   . Tee without cardioversion  01/31/2012    Procedure: TRANSESOPHAGEAL ECHOCARDIOGRAM (TEE);  Surgeon: Pricilla Riffle, MD;  Location: [facility] ENDOSCOPY;  Service: Cardiovascular;  Laterality: N/A;  . Cardioversion N/A 01/16/2013    Procedure: CARDIOVERSION;  Surgeon: Vesta Mixer, MD;  Location: [facility] ENDOSCOPY;  Service: Cardiovascular;  Laterality: N/A;   Family History  Problem Relation Age of Onset  . Diabetes Mother   . CVA Mother   . Other Father   . Diabetes Brother   . Cancer Brother     throat ca, smoker  . Aneurysm Brother     brain aneurysm  . Breast cancer Sister   . Emphysema Sister     smoker   History  Substance Use Topics  . Smoking status: Former Smoker -- 1.00 packs/day for 40 years    Types: Cigarettes    Quit date: 05/21/1990  . Smokeless tobacco: Never Used  . Alcohol Use: No   OB History   Grav  Para Term Preterm Abortions TAB SAB Ect Mult Living                 Review of Systems  All other systems reviewed and are negative.     Allergies  Ivp dye; Nitrofurantoin; and Ramipril  Home Medications   Prior to Admission medications   Medication Sig Start Date End Date Taking? Authorizing Provider  amiodarone (PACERONE) 100 MG tablet Take 100 mg by mouth daily.   Yes Historical Provider, MD  amoxicillin (AMOXIL) 500 MG capsule Take 2,000 mg by mouth once. One hour prior to dental appointment 02/09/14  Yes Historical Provider, MD  calcitonin, salmon, (MIACALCIN/FORTICAL) 200 UNIT/ACT nasal spray Place 1 spray into alternate nostrils daily. 01/23/14  Yes Historical Provider, MD  Calcium Carbonate-Vitamin D (CALCIUM 500 + D PO) Take 1 tablet by mouth 2 (two)  times daily.    Yes Historical Provider, MD  cholecalciferol (VITAMIN D) 1000 UNITS tablet Take 1,000 Units by mouth at bedtime.    Yes Historical Provider, MD  ferrous sulfate 325 (65 FE) MG tablet Take 325 mg by mouth 2 (two) times a week. Tuesdays and Thursdays   Yes Historical Provider, MD  furosemide (LASIX) 20 MG tablet Take 1 tablet (20 mg total) by mouth daily. 11/09/13  Yes Pricilla Riffle, MD  levothyroxine (SYNTHROID, LEVOTHROID) 125 MCG tablet Take 62.5 mcg by mouth daily before breakfast.   Yes Historical Provider, MD  metoprolol tartrate (LOPRESSOR) 25 MG tablet Take 1 tablet (25 mg total) by mouth 2 (two) times daily. 09/21/13  Yes Pricilla Riffle, MD  nitroGLYCERIN (NITROSTAT) 0.4 MG SL tablet Place 0.4 mg under the tongue every 5 (five) minutes as needed for chest pain.   Yes Historical Provider, MD  polyethylene glycol (MIRALAX / GLYCOLAX) packet Take 17 g by mouth daily as needed for mild constipation.    Yes Historical Provider, MD  potassium chloride SA (K-DUR,KLOR-CON) 20 MEQ tablet Take 20 mEq by mouth 2 (two) times daily.   Yes Historical Provider, MD  Probiotic Product (PROBIOTIC DAILY PO) Take 1 tablet by mouth daily.   Yes Historical Provider, MD  sertraline (ZOLOFT) 100 MG tablet Take 100 mg by mouth daily.   Yes Historical Provider, MD  naproxen (NAPROSYN) 500 MG tablet Take 1 tablet (500 mg total) by mouth 2 (two) times daily with a meal. 02/12/14   Vida Roller, MD  ondansetron (ZOFRAN ODT) 4 MG disintegrating tablet Take 1 tablet (4 mg total) by mouth every 8 (eight) hours as needed for nausea. 02/12/14   Vida Roller, MD   BP 150/70  Pulse 80  Temp(Src) 99.3 F (37.4 C) (Oral)  Resp 23  SpO2 93% Physical Exam  Nursing note and vitals reviewed. Constitutional: She appears well-developed and well-nourished. No distress.  HENT:  Head: Normocephalic.  Mouth/Throat: Oropharynx is clear and moist. No oropharyngeal exudate.  Periorbital hematoma to the right face No  obvious conjunctival injuries, no lid lacerations, right cheek laceration inferior to the orbit. This is approximately 2 and half centimeters in length  Eyes: Conjunctivae and EOM are normal. Pupils are equal, round, and reactive to light. Right eye exhibits no discharge. Left eye exhibits no discharge. No scleral icterus.  Neck: Normal range of motion. Neck supple. No JVD present. No thyromegaly present.  Cardiovascular: Normal rate, regular rhythm, normal heart sounds and intact distal pulses.  Exam reveals no gallop and no friction rub.   No murmur heard. Pulmonary/Chest: Effort normal and breath  sounds normal. No respiratory distress. She has no wheezes. She has no rales.  Abdominal: Soft. Bowel sounds are normal. She exhibits no distension and no mass. There is no tenderness.  Musculoskeletal: Normal range of motion. She exhibits no edema and no tenderness.  Lymphadenopathy:    She has no cervical adenopathy.  Neurological: She is alert. Coordination normal.  Skin: Skin is warm and dry. No rash noted. No erythema.  Psychiatric: She has a normal mood and affect. Her behavior is normal.    ED Course  Procedures (including critical care time) Labs Review Labs Reviewed  CBC - Abnormal; Notable for the following:    Hemoglobin 11.8 (*)    RDW 17.0 (*)    Platelets 104 (*)    All other components within normal limits  BASIC METABOLIC PANEL - Abnormal; Notable for the following:    Glucose, Bld 101 (*)    GFR calc non Af Amer 56 (*)    GFR calc Af Amer 65 (*)    All other components within normal limits  URINALYSIS, ROUTINE W REFLEX MICROSCOPIC - Abnormal; Notable for the following:    APPearance HAZY (*)    All other components within normal limits  PROTIME-INR - Abnormal; Notable for the following:    Prothrombin Time 15.7 (*)    All other components within normal limits    Imaging Review Ct Head Wo Contrast  02/12/2014   CLINICAL DATA:  80 year old female with fall with head,  face and neck injury and pain. Right high/orbit bruising.  EXAM: CT HEAD WITHOUT CONTRAST  CT MAXILLOFACIAL WITHOUT CONTRAST  CT CERVICAL SPINE WITHOUT CONTRAST  TECHNIQUE: Multidetector CT imaging of the head, cervical spine, and maxillofacial structures were performed using the standard protocol without intravenous contrast. Multiplanar CT image reconstructions of the cervical spine and maxillofacial structures were also generated.  COMPARISON:  08/20/2013 and prior CTs  FINDINGS: CT HEAD FINDINGS  Mild to moderate atrophy and chronic small-vessel white matter ischemic changes again noted. A remote right cerebellar infarct is noted.  No acute intracranial abnormalities are identified, including mass lesion or mass effect, hydrocephalus, extra-axial fluid collection, midline shift, hemorrhage, or acute infarction.  CT MAXILLOFACIAL FINDINGS  A right orbital floor fracture is identified with up to 8 mm displacement laterally.  Herniation of fat through the fracture site is noted without definite extraocular muscle herniation but correlate clinically. Soft tissue swelling overlying the right face and orbit noted.  The globes retain their spherical shape but there is proptosis on the right noted.  No other fracture, subluxation or dislocation identified.  The remainder the paranasal sinuses, mastoid air cells and middle/ inner ears are clear.  No focal bony lesions are identified.  CT CERVICAL SPINE FINDINGS  Moderate apex left cervical scoliosis is again noted.  There is no evidence of acute fracture, subluxation or prevertebral soft swelling.  Mild to moderate multilevel degenerative disc disease, spondylosis and facet arthropathy again noted.  No focal bony lesions are present.  The soft tissue structures are unremarkable.  IMPRESSION: Right orbital floor fracture displaced up to 8 mm laterally. Herniation of fat without definite extraocular muscle herniation through the fracture site but correlate clinically.  No  evidence of acute intracranial abnormality.  No static evidence of acute injury to the cervical spine.  Mild to moderate cerebral atrophy, chronic small-vessel white matter ischemic changes, remote right cerebellar infarct, and mild to moderate multilevel cervical spine degenerative changes.   Electronically Signed   By: Trey Paula  Hu M.D.   On: 02/12/2014 15:05   Ct Cervical Spine Wo Contrast  02/12/2014   CLINICAL DATA:  80 year old female with fall with head, face and neck injury and pain. Right high/orbit bruising.  EXAM: CT HEAD WITHOUT CONTRAST  CT MAXILLOFACIAL WITHOUT CONTRAST  CT CERVICAL SPINE WITHOUT CONTRAST  TECHNIQUE: Multidetector CT imaging of the head, cervical spine, and maxillofacial structures were performed using the standard protocol without intravenous contrast. Multiplanar CT image reconstructions of the cervical spine and maxillofacial structures were also generated.  COMPARISON:  08/20/2013 and prior CTs  FINDINGS: CT HEAD FINDINGS  Mild to moderate atrophy and chronic small-vessel white matter ischemic changes again noted. A remote right cerebellar infarct is noted.  No acute intracranial abnormalities are identified, including mass lesion or mass effect, hydrocephalus, extra-axial fluid collection, midline shift, hemorrhage, or acute infarction.  CT MAXILLOFACIAL FINDINGS  A right orbital floor fracture is identified with up to 8 mm displacement laterally.  Herniation of fat through the fracture site is noted without definite extraocular muscle herniation but correlate clinically. Soft tissue swelling overlying the right face and orbit noted.  The globes retain their spherical shape but there is proptosis on the right noted.  No other fracture, subluxation or dislocation identified.  The remainder the paranasal sinuses, mastoid air cells and middle/ inner ears are clear.  No focal bony lesions are identified.  CT CERVICAL SPINE FINDINGS  Moderate apex left cervical scoliosis is again noted.   There is no evidence of acute fracture, subluxation or prevertebral soft swelling.  Mild to moderate multilevel degenerative disc disease, spondylosis and facet arthropathy again noted.  No focal bony lesions are present.  The soft tissue structures are unremarkable.  IMPRESSION: Right orbital floor fracture displaced up to 8 mm laterally. Herniation of fat without definite extraocular muscle herniation through the fracture site but correlate clinically.  No evidence of acute intracranial abnormality.  No static evidence of acute injury to the cervical spine.  Mild to moderate cerebral atrophy, chronic small-vessel white matter ischemic changes, remote right cerebellar infarct, and mild to moderate multilevel cervical spine degenerative changes.   Electronically Signed   By: Laveda Abbe M.D.   On: 02/12/2014 15:05   Ct Maxillofacial Wo Cm  02/12/2014   CLINICAL DATA:  80 year old female with fall with head, face and neck injury and pain. Right high/orbit bruising.  EXAM: CT HEAD WITHOUT CONTRAST  CT MAXILLOFACIAL WITHOUT CONTRAST  CT CERVICAL SPINE WITHOUT CONTRAST  TECHNIQUE: Multidetector CT imaging of the head, cervical spine, and maxillofacial structures were performed using the standard protocol without intravenous contrast. Multiplanar CT image reconstructions of the cervical spine and maxillofacial structures were also generated.  COMPARISON:  08/20/2013 and prior CTs  FINDINGS: CT HEAD FINDINGS  Mild to moderate atrophy and chronic small-vessel white matter ischemic changes again noted. A remote right cerebellar infarct is noted.  No acute intracranial abnormalities are identified, including mass lesion or mass effect, hydrocephalus, extra-axial fluid collection, midline shift, hemorrhage, or acute infarction.  CT MAXILLOFACIAL FINDINGS  A right orbital floor fracture is identified with up to 8 mm displacement laterally.  Herniation of fat through the fracture site is noted without definite extraocular  muscle herniation but correlate clinically. Soft tissue swelling overlying the right face and orbit noted.  The globes retain their spherical shape but there is proptosis on the right noted.  No other fracture, subluxation or dislocation identified.  The remainder the paranasal sinuses, mastoid air cells and middle/ inner ears  are clear.  No focal bony lesions are identified.  CT CERVICAL SPINE FINDINGS  Moderate apex left cervical scoliosis is again noted.  There is no evidence of acute fracture, subluxation or prevertebral soft swelling.  Mild to moderate multilevel degenerative disc disease, spondylosis and facet arthropathy again noted.  No focal bony lesions are present.  The soft tissue structures are unremarkable.  IMPRESSION: Right orbital floor fracture displaced up to 8 mm laterally. Herniation of fat without definite extraocular muscle herniation through the fracture site but correlate clinically.  No evidence of acute intracranial abnormality.  No static evidence of acute injury to the cervical spine.  Mild to moderate cerebral atrophy, chronic small-vessel white matter ischemic changes, remote right cerebellar infarct, and mild to moderate multilevel cervical spine degenerative changes.   Electronically Signed   By: Laveda Abbe M.D.   On: 02/12/2014 15:05     EKG Interpretation   Date/Time:  Friday February 12 2014 13:42:19 EDT Ventricular Rate:  78 PR Interval:    QRS Duration: 131 QT Interval:  485 QTC Calculation: 552 R Axis:   -63 Text Interpretation:  Atrial fibrillation IVCD, consider atypical RBBB  Left ventricular hypertrophy Anterior infarct, old since last tracing no  significant change Confirmed by Tae Robak  MD, Tyianna Menefee ([id number]) on 02/12/2014  3:59:43 PM      MDM   Final diagnoses:  Orbital floor fracture, closed, initial encounter  Laceration of cheek, right, initial encounter    The patient has signs of head injury, she has risk due to her anticoagulated state that this  injury could cause worsening condition, CT scan indicated, check labs to rule out source of patient's fall that she states was likely mechanical, will evaluate metabolic and urinalysis. EKG pending  The patient is moving all extremities without difficulty, her CT scan shows an orbital floor fracture, there is no entrapment of her eyes, I will discuss this with maxillofacial ENT  Discussed with Dr. Annalee Genta, agrees with followup in the office  LACERATION REPAIR Performed by: Vida Roller Authorized by: Vida Roller Consent: Verbal consent obtained. Risks and benefits: risks, benefits and alternatives were discussed Consent given by: patient Patient identity confirmed: provided demographic data Prepped and Draped in normal sterile fashion Wound explored  Laceration Location: Right cheek  Laceration Length: 2.5 cm  No Foreign Bodies seen or palpated  Anesthesia: None   Local anesthetic: None  Irrigation method: syringe  Amount of cleaning: standard  Skin closure: Dermabond   Number of sutures: Dermabond   Technique: Dermabond   Patient tolerance: Patient tolerated the procedure well with no immediate complications.  Pt has no hypoxia or abnormal lungs sounds on repeat exam.  Meds given in ED:  Medications  acetaminophen (TYLENOL) tablet 1,000 mg (not administered)    New Prescriptions   NAPROXEN (NAPROSYN) 500 MG TABLET    Take 1 tablet (500 mg total) by mouth 2 (two) times daily with a meal.   ONDANSETRON (ZOFRAN ODT) 4 MG DISINTEGRATING TABLET    Take 1 tablet (4 mg total) by mouth every 8 (eight) hours as needed for nausea.       Vida Roller, MD 02/12/14 [phone]

## 2014-02-12 NOTE — ED Notes (Signed)
MD at bedside to [facility] r eye lac

## 2014-02-12 NOTE — ED Provider Notes (Signed)
17:05-was asked by nursing to evaluate the patient prior to sending her home. She had persistent nausea and vomiting, after taking take Tylenol for pain. She has additionally bleeding from another laceration, that was not closed earlier. I ordered Zofran for nausea.  Assessment- patient alert, cooperative, complaining of nausea, and pain around her right eye. She denies pain in the hand itself. Right eye is swollen shut. There is a bleeding laceration of the upper eyelid, which is 2.5 cm in length, and superficial. This wound is dripping red blood. I was able to view the right globe, by manually retracting. Her eyelids. There is moderate chemosis of the sclera palpebral  Tissue. The patient is unable to mobilize the right globe, either medially or laterally, secondary to pain with attempts at eye movements. The patient is alert, calm, cooperative. She had several episodes of nausea, retching, and spitting greenish, yellow fluid, while I was evaluating her.  Wound care- right eye was blotted clean. The wound edges were approximated with Dermabond, tissue adhesive, by me. Care was taken to prevent the Dermabond, from getting into the eye.  Patient is treated with IV fluid bolus, and a second dose of Zofran.  Reviewed imaging, and assessment, from earlier, by Dr. Hyacinth Meeker.  Patient felt better after additional fluid bolus, and was able to tolerate some oral liquids. There were no additional complaints. Findings were discussed with patient and family members, all questions answered. The patient is going to observe setting, where she has access to caregivers, by ringing a call bell.  Flint Melter, MD 02/13/14 Marlyne Beards

## 2014-02-12 NOTE — Discharge Instructions (Signed)
Ice to the face every 20 minutes, 5 minutes at a time.  Swelling should go down over next week  See the ENT specalist listed above in 7 days.  Please call your doctor for a followup appointment within 24-48 hours. When you talk to your doctor please let them know that you were seen in the emergency department and have them acquire all of your records so that they can discuss the findings with you and formulate a treatment plan to fully care for your new and ongoing problems.

## 2014-02-12 NOTE — ED Notes (Signed)
Per EMS- Pt comes from [facility] [employer], today she was having trouble with her oxygen tubing and felt dizzy because she didn't have her oxygen on, walked into the hallway to get some help, she got tangled up in the tubing and fell into the oxygen tank hitting her right eye. Has swelling and bruising to her right eye. No LOC, no thinner. Is a x 4

## 2014-02-12 NOTE — ED Notes (Signed)
Pt began having nausea and vomitting. MD notified. Ordered zofran  ODT, pt unable to keep pills under tongue. MD ordered IV to replace. Pt cleaned and resting now. Nausea controlled.

## 2014-02-12 NOTE — ED Notes (Signed)
MD at bedside. 

## 2014-02-13 ENCOUNTER — Inpatient Hospital Stay (HOSPITAL_COMMUNITY): Payer: Medicare Other

## 2014-02-13 ENCOUNTER — Encounter (HOSPITAL_COMMUNITY): Payer: Self-pay | Admitting: Emergency Medicine

## 2014-02-13 ENCOUNTER — Inpatient Hospital Stay (HOSPITAL_COMMUNITY)
Admission: EM | Admit: 2014-02-13 | Discharge: 2014-02-15 | DRG: 312 | Disposition: A | Discharge status: Home / Self Care | Payer: Medicare Other | Attending: Internal Medicine | Admitting: Internal Medicine

## 2014-02-13 ENCOUNTER — Emergency Department (HOSPITAL_COMMUNITY): Payer: Medicare Other

## 2014-02-13 DIAGNOSIS — I4891 Unspecified atrial fibrillation: Principal | ICD-10-CM | POA: Diagnosis present

## 2014-02-13 DIAGNOSIS — G4730 Sleep apnea, unspecified: Secondary | ICD-10-CM | POA: Diagnosis present

## 2014-02-13 DIAGNOSIS — Z9849 Cataract extraction status, unspecified eye: Secondary | ICD-10-CM | POA: Diagnosis not present

## 2014-02-13 DIAGNOSIS — IMO0002 Reserved for concepts with insufficient information to code with codable children: Secondary | ICD-10-CM | POA: Diagnosis present

## 2014-02-13 DIAGNOSIS — I7101 Dissection of thoracic aorta: Secondary | ICD-10-CM | POA: Diagnosis not present

## 2014-02-13 DIAGNOSIS — Z87891 Personal history of nicotine dependence: Secondary | ICD-10-CM

## 2014-02-13 DIAGNOSIS — Z7901 Long term (current) use of anticoagulants: Secondary | ICD-10-CM

## 2014-02-13 DIAGNOSIS — I482 Chronic atrial fibrillation, unspecified: Secondary | ICD-10-CM

## 2014-02-13 DIAGNOSIS — J449 Chronic obstructive pulmonary disease, unspecified: Secondary | ICD-10-CM | POA: Diagnosis present

## 2014-02-13 DIAGNOSIS — I5033 Acute on chronic diastolic (congestive) heart failure: Secondary | ICD-10-CM | POA: Diagnosis present

## 2014-02-13 DIAGNOSIS — S0230XA Fracture of orbital floor, unspecified side, initial encounter for closed fracture: Secondary | ICD-10-CM | POA: Diagnosis present

## 2014-02-13 DIAGNOSIS — I119 Hypertensive heart disease without heart failure: Secondary | ICD-10-CM | POA: Diagnosis present

## 2014-02-13 DIAGNOSIS — I110 Hypertensive heart disease with heart failure: Secondary | ICD-10-CM | POA: Diagnosis present

## 2014-02-13 DIAGNOSIS — I71012 Dissection of descending thoracic aorta: Secondary | ICD-10-CM

## 2014-02-13 DIAGNOSIS — E039 Hypothyroidism, unspecified: Secondary | ICD-10-CM | POA: Diagnosis present

## 2014-02-13 DIAGNOSIS — I050 Rheumatic mitral stenosis: Secondary | ICD-10-CM | POA: Diagnosis present

## 2014-02-13 DIAGNOSIS — I712 Thoracic aortic aneurysm, without rupture, unspecified: Secondary | ICD-10-CM

## 2014-02-13 DIAGNOSIS — I252 Old myocardial infarction: Secondary | ICD-10-CM

## 2014-02-13 DIAGNOSIS — W19XXXA Unspecified fall, initial encounter: Secondary | ICD-10-CM | POA: Diagnosis present

## 2014-02-13 DIAGNOSIS — I5032 Chronic diastolic (congestive) heart failure: Secondary | ICD-10-CM

## 2014-02-13 DIAGNOSIS — I4949 Other premature depolarization: Secondary | ICD-10-CM | POA: Diagnosis present

## 2014-02-13 DIAGNOSIS — I2510 Atherosclerotic heart disease of native coronary artery without angina pectoris: Secondary | ICD-10-CM | POA: Diagnosis present

## 2014-02-13 DIAGNOSIS — Z953 Presence of xenogenic heart valve: Secondary | ICD-10-CM

## 2014-02-13 DIAGNOSIS — Z66 Do not resuscitate: Secondary | ICD-10-CM | POA: Diagnosis not present

## 2014-02-13 DIAGNOSIS — S0280XD Fracture of other specified skull and facial bones, unspecified side, subsequent encounter for fracture with routine healing: Secondary | ICD-10-CM

## 2014-02-13 DIAGNOSIS — Z954 Presence of other heart-valve replacement: Secondary | ICD-10-CM | POA: Diagnosis not present

## 2014-02-13 DIAGNOSIS — J4489 Other specified chronic obstructive pulmonary disease: Secondary | ICD-10-CM | POA: Diagnosis present

## 2014-02-13 DIAGNOSIS — S0990XA Unspecified injury of head, initial encounter: Secondary | ICD-10-CM | POA: Diagnosis not present

## 2014-02-13 DIAGNOSIS — I71019 Dissection of thoracic aorta, unspecified: Secondary | ICD-10-CM | POA: Diagnosis present

## 2014-02-13 DIAGNOSIS — I2789 Other specified pulmonary heart diseases: Secondary | ICD-10-CM | POA: Diagnosis present

## 2014-02-13 DIAGNOSIS — R55 Syncope and collapse: Secondary | ICD-10-CM | POA: Diagnosis present

## 2014-02-13 DIAGNOSIS — I359 Nonrheumatic aortic valve disorder, unspecified: Secondary | ICD-10-CM | POA: Diagnosis not present

## 2014-02-13 DIAGNOSIS — J9610 Chronic respiratory failure, unspecified whether with hypoxia or hypercapnia: Secondary | ICD-10-CM | POA: Diagnosis present

## 2014-02-13 DIAGNOSIS — Z6826 Body mass index (BMI) 26.0-26.9, adult: Secondary | ICD-10-CM

## 2014-02-13 DIAGNOSIS — I509 Heart failure, unspecified: Secondary | ICD-10-CM | POA: Diagnosis present

## 2014-02-13 DIAGNOSIS — Z9981 Dependence on supplemental oxygen: Secondary | ICD-10-CM | POA: Diagnosis not present

## 2014-02-13 DIAGNOSIS — I421 Obstructive hypertrophic cardiomyopathy: Secondary | ICD-10-CM | POA: Diagnosis present

## 2014-02-13 DIAGNOSIS — Z9181 History of falling: Secondary | ICD-10-CM

## 2014-02-13 DIAGNOSIS — E785 Hyperlipidemia, unspecified: Secondary | ICD-10-CM | POA: Diagnosis present

## 2014-02-13 DIAGNOSIS — Z515 Encounter for palliative care: Secondary | ICD-10-CM | POA: Diagnosis not present

## 2014-02-13 LAB — CBC WITH DIFFERENTIAL/PLATELET
Basophils Absolute: 0 10*3/uL (ref 0.0–0.1)
Basophils Relative: 0 % (ref 0–1)
Eosinophils Absolute: 0.4 10*3/uL (ref 0.0–0.7)
Eosinophils Relative: 4 % (ref 0–5)
HCT: 37 % (ref 36.0–46.0)
Hemoglobin: 11.3 g/dL — ABNORMAL LOW (ref 12.0–15.0)
LYMPHS PCT: 9 % — AB (ref 12–46)
Lymphs Abs: 0.8 10*3/uL (ref 0.7–4.0)
MCH: 27.3 pg (ref 26.0–34.0)
MCHC: 30.5 g/dL (ref 30.0–36.0)
MCV: 89.4 fL (ref 78.0–100.0)
Monocytes Absolute: 0.6 10*3/uL (ref 0.1–1.0)
Monocytes Relative: 7 % (ref 3–12)
NEUTROS ABS: 7.2 10*3/uL (ref 1.7–7.7)
NEUTROS PCT: 81 % — AB (ref 43–77)
PLATELETS: 107 10*3/uL — AB (ref 150–400)
RBC: 4.14 MIL/uL (ref 3.87–5.11)
RDW: 17.2 % — AB (ref 11.5–15.5)
WBC: 9 10*3/uL (ref 4.0–10.5)

## 2014-02-13 LAB — BASIC METABOLIC PANEL
ANION GAP: 12 (ref 5–15)
BUN: 18 mg/dL (ref 6–23)
CALCIUM: 9.2 mg/dL (ref 8.4–10.5)
CHLORIDE: 101 meq/L (ref 96–112)
CO2: 28 meq/L (ref 19–32)
CREATININE: 1.05 mg/dL (ref 0.50–1.10)
GFR calc non Af Amer: 47 mL/min — ABNORMAL LOW (ref >90)
GFR, EST AFRICAN AMERICAN: 55 mL/min — AB (ref >90)
Glucose, Bld: 96 mg/dL (ref 70–99)
Potassium: 4.6 mEq/L (ref 3.7–5.3)
SODIUM: 141 meq/L (ref 137–147)

## 2014-02-13 LAB — URINALYSIS, ROUTINE W REFLEX MICROSCOPIC
BILIRUBIN URINE: NEGATIVE
Glucose, UA: NEGATIVE mg/dL
Hgb urine dipstick: NEGATIVE
KETONES UR: NEGATIVE mg/dL
Leukocytes, UA: NEGATIVE
Nitrite: NEGATIVE
PH: 5 (ref 5.0–8.0)
Protein, ur: NEGATIVE mg/dL
SPECIFIC GRAVITY, URINE: 1.008 (ref 1.005–1.030)
UROBILINOGEN UA: 0.2 mg/dL (ref 0.0–1.0)

## 2014-02-13 LAB — PROTIME-INR
INR: 1.25 (ref 0.00–1.49)
PROTHROMBIN TIME: 15.7 s — AB (ref 11.6–15.2)

## 2014-02-13 LAB — TROPONIN I: Troponin I: <0.3 ng/mL (ref <0.30)

## 2014-02-13 LAB — PRO B NATRIURETIC PEPTIDE: Pro B Natriuretic peptide (BNP): 6333 pg/mL — ABNORMAL HIGH (ref 0–450)

## 2014-02-13 MED ORDER — AMIODARONE HCL 100 MG PO TABS
100.0000 mg | ORAL_TABLET | Freq: Every day | ORAL | Status: DC
  Administered 2014-02-14 – 2014-02-15 (×2): 100 mg via ORAL
  Filled 2014-02-13 (×2): qty 1

## 2014-02-13 MED ORDER — ACETAMINOPHEN 650 MG RE SUPP: 650.0000 mg | Freq: Four times a day (QID) | RECTAL | Status: DC | PRN

## 2014-02-13 MED ORDER — ONDANSETRON HCL 4 MG/2ML IJ SOLN: 4.0000 mg | Freq: Four times a day (QID) | INTRAMUSCULAR | Status: DC | PRN

## 2014-02-13 MED ORDER — CALCITONIN (SALMON) 200 UNIT/ACT NA SOLN
1.0000 | Freq: Every day | NASAL | Status: DC
  Administered 2014-02-14 – 2014-02-15 (×2): 1 via NASAL
  Filled 2014-02-13: qty 3.7

## 2014-02-13 MED ORDER — ONDANSETRON HCL 4 MG PO TABS: 4.0000 mg | ORAL_TABLET | Freq: Four times a day (QID) | ORAL | Status: DC | PRN

## 2014-02-13 MED ORDER — METOPROLOL TARTRATE 25 MG PO TABS
25.0000 mg | ORAL_TABLET | Freq: Two times a day (BID) | ORAL | Status: DC
  Administered 2014-02-13 – 2014-02-15 (×4): 25 mg via ORAL
  Filled 2014-02-13 (×5): qty 1

## 2014-02-13 MED ORDER — SERTRALINE HCL 100 MG PO TABS
100.0000 mg | ORAL_TABLET | Freq: Every day | ORAL | Status: DC
  Administered 2014-02-13 – 2014-02-15 (×3): 100 mg via ORAL
  Filled 2014-02-13 (×3): qty 1

## 2014-02-13 MED ORDER — LEVOTHYROXINE SODIUM 125 MCG PO TABS
62.5000 ug | ORAL_TABLET | Freq: Every day | ORAL | Status: DC
  Administered 2014-02-14 – 2014-02-15 (×2): 62.5 ug via ORAL
  Filled 2014-02-13 (×3): qty 0.5

## 2014-02-13 MED ORDER — ACETAMINOPHEN 325 MG PO TABS
650.0000 mg | ORAL_TABLET | Freq: Four times a day (QID) | ORAL | Status: DC | PRN
Start: 2014-02-13 — End: 2014-02-15
  Administered 2014-02-14 – 2014-02-15 (×3): 650 mg via ORAL
  Filled 2014-02-13 (×3): qty 2

## 2014-02-13 MED ORDER — VITAMIN D3 25 MCG (1000 UNIT) PO TABS
1000.0000 [IU] | ORAL_TABLET | Freq: Every day | ORAL | Status: DC
  Administered 2014-02-13 – 2014-02-14 (×2): 1000 [IU] via ORAL
  Filled 2014-02-13 (×3): qty 1

## 2014-02-13 NOTE — Consult Note (Signed)
Cardiology Consult Note  Admit date: 02/13/2014 Name: Patricia [name] 80 y.o.  female DOB:  1927-04-30 MRN:  [id number]  Today's date:  02/13/2014  Referring Physician:   [facility]  Reason for Consultation:    Syncope  IMPRESSIONS: 1. 2 recent falls that may have been syncope. Both were unwitnessed and it is hard to tell whether this was loss of balance with a subsequent fall or whether she had a blood pressure or rhythm event prior to the fall. 2. Hypertrophic cardiomyopathy 3. Atrial fibrillation with controlled response 4. Type B aortic dissection not felt to be a candidate for repair 5. Palliative care  6. Prosthetic valve mitral stenosis 7. Hypertensive heart disease 8. Significant pulmonary hypertension 9. Acute on chronic diastolic heart failure which is likely worse due 2 atrial fibrillation in the presence of mitral stenosis as well as hypertrophic cardiomyopathy  RECOMMENDATION: 1. Telemetry observation overnight. 2. check orthostatic blood pressures 3. Monitor for worsening heart failure  HISTORY: This 80 year-old female is a very complex history. She has a history of hypertrophic cardiomyopathy and had a previous mitral valve replacement in 2007. She is physically developed mitral stenosis. More recently she has had functional decline and is now developed severe pulmonary hypertension and is now wearing oxygen continuously. She lives in [facility] and has had frequent falling episodes. She had a fall in April and was found to have a type II aortic dissection at that time with a marked expansion of her aorta. She was felt to be a very poor candidate for repair at that time and was taken off of warfarin which she had been on for atrial fibrillation. Since then she has been seen by Dr. Tenny Craw as an outpatient but has been in rehabilitation for a while and did not really do very well with that.  She yesterday was going to the dining room at her place and felt  like her oxygen wasn't working well and had a syncopal episode where she fell in the fall all. It is uncertain whether she actually passed out or whether she got tangled up in the equipment but she developed a severe hematoma of her eye and was brought to the emergency room. She vomited after she arrived here was somewhat dizzy but was eventually sent home last night. The daughter stayed with her and she felt relatively well this morning but did not really eat much yesterday. After eating breakfast she decided to take a shower and after being in the shower was reaching for washcloth and again fell in the bathroom. It is unclear with her she passed out or whether she fell while reaching for the washcloth and she was brought back here. She was not demonstrated to have any hypotension or bradycardia on either occasion. Her most recent catheterization has shown no significant coronary artery disease. She is quite limited with dyspnea and wears oxygen. She really doesn't have much in the way of edema. She has been under hospice and palliative care.  Past Medical History  Diagnosis Date  . CAD (coronary artery disease)   . Mitral valve disorder     a. s/p MVR;  b. 06/2013 Echo: EF 55-60%, no rwma, Ao sclerosis, mild AI, mild MR, miod dil LA, sev dil RV with reduced fxn, sev dil RA, mod TR/PR, PASP (study reviewed by Dr. Verlin Fester- MS felt to be sev, peak grad 39/mean 13).  [name] HTN (hypertension)   . Dyslipidemia   . Aortic stenosis, mild   .  Breast cyst   . Hypertrophic cardiomyopathy   . Sleep apnea   . COPD (chronic obstructive pulmonary disease)     "a touch of"; prn o2 at home with activity   . Atrial fibrillation     a. chronic coumadin.  [name] Hypothyroidism   . Anemia   . Chronic right-sided heart failure       Past Surgical History  Procedure Laterality Date  . Laparoscopic cholecystectomy  2009  . Cataract extraction  2003    bilateral  . Mitral valve replacement  2007  . Neck mass excision   2003    benign  . Colonoscopy    . Broken wrist      left, metal implanted   . Broken ankle      right, metal implanted   . Cardiac catheterization  06/2011  . Breast surgery      bilateral cysts x3 removed, benign   . Tee without cardioversion  01/31/2012    Procedure: TRANSESOPHAGEAL ECHOCARDIOGRAM (TEE);  Surgeon: Pricilla Riffle, MD;  Location: [facility] ENDOSCOPY;  Service: Cardiovascular;  Laterality: N/A;  . Cardioversion N/A 01/16/2013    Procedure: CARDIOVERSION;  Surgeon: Vesta Mixer, MD;  Location: [facility] ENDOSCOPY;  Service: Cardiovascular;  Laterality: N/A;   Allergies:  is allergic to ivp dye; nitrofurantoin; and ramipril.   Medications: Prior to Admission medications   Medication Sig Start Date End Date Taking? Authorizing Provider  amiodarone (PACERONE) 100 MG tablet Take 100 mg by mouth daily.   Yes Historical Provider, MD  amoxicillin (AMOXIL) 500 MG capsule Take 2,000 mg by mouth once. One hour prior to dental appointment 02/09/14  Yes Historical Provider, MD  calcitonin, salmon, (MIACALCIN/FORTICAL) 200 UNIT/ACT nasal spray Place 1 spray into alternate nostrils daily. 01/23/14  Yes Historical Provider, MD  Calcium Carbonate-Vitamin D (CALCIUM 500 + D PO) Take 1 tablet by mouth 2 (two) times daily.    Yes Historical Provider, MD  cholecalciferol (VITAMIN D) 1000 UNITS tablet Take 1,000 Units by mouth at bedtime.    Yes Historical Provider, MD  ferrous sulfate 325 (65 FE) MG tablet Take 325 mg by mouth 2 (two) times a week. Tuesdays and Thursdays   Yes Historical Provider, MD  furosemide (LASIX) 20 MG tablet Take 1 tablet (20 mg total) by mouth daily. 11/09/13  Yes Pricilla Riffle, MD  levothyroxine (SYNTHROID, LEVOTHROID) 125 MCG tablet Take 62.5 mcg by mouth daily before breakfast.   Yes Historical Provider, MD  metoprolol tartrate (LOPRESSOR) 25 MG tablet Take 1 tablet (25 mg total) by mouth 2 (two) times daily. 09/21/13  Yes Pricilla Riffle, MD  potassium chloride SA (K-DUR,KLOR-CON) 20  MEQ tablet Take 20 mEq by mouth 2 (two) times daily.   Yes Historical Provider, MD  Probiotic Product (PROBIOTIC DAILY PO) Take 1 tablet by mouth daily.   Yes Historical Provider, MD  sertraline (ZOLOFT) 100 MG tablet Take 100 mg by mouth daily.   Yes Historical Provider, MD  nitroGLYCERIN (NITROSTAT) 0.4 MG SL tablet Place 0.4 mg under the tongue every 5 (five) minutes as needed for chest pain.    Historical Provider, MD    Family History: Family Status  Relation Status Death Age  . Mother  73  . Father      accidental death  . Brother  6  . Sister  1  . Sister  29   Social History:   reports that she quit smoking about 23 years ago. Her smoking use included Cigarettes.  She has a 40 pack-year smoking history. She has never used smokeless tobacco. She reports that she does not drink alcohol or use illicit drugs.   History   Social History Narrative   Lives with her husband.  Drives and walks around without assist device.           Review of Systems: Reduced appetite, some chronic back pain, some urinary frequency. No recent GI bleeding. Has been off of warfarin since April and has been in atrial fibrillation since then. Some mild PND. Other than as noted above remainder of the review of systems is unremarkable.  Physical Exam: BP 134/62  Pulse 70  Temp(Src) 98.4 F (36.9 C) (Oral)  Resp 23  SpO2 93%  General appearance: Elderly woman lying in the bed currently in no acute distress and able to give a coherent history Head: Normocephalic, without obvious abnormality, Large ecchymosis involving the right orbit with the IV and mostly closed and unable to see out of the eye Eyes: Left eye appears normal, right eye with significant ecchymoses around the orbit at the site of the previous fracture Neck: no adenopathy, no carotid bruit, supple, symmetrical, trachea midline and JVD mildly elevated Lungs: Mild crackles at bases, increased AP diameter Heart: irregular rhythm, healed  median sternotomy scar, normal S1-S2,  1-2/6 systolic murmur at the left sternal border, soft diastolic murmur heard at the apex Abdomen: soft, non-tender; bowel sounds normal; no masses,  no organomegaly Rectal: deferred Extremities: No obvious trauma, no edema noted today. Pulses: 2+ and symmetric Skin: Skin color, texture, turgor normal. No rashes or lesions Neurologic: Grossly normal  Labs: CBC  Recent Labs  02/13/14 1544  WBC 9.0  RBC 4.14  HGB 11.3*  HCT 37.0  PLT 107*  MCV 89.4  MCH 27.3  MCHC 30.5  RDW 17.2*  LYMPHSABS 0.8  MONOABS 0.6  EOSABS 0.4  BASOSABS 0.0   CMP   Recent Labs  02/13/14 1544  NA 141  K 4.6  CL 101  CO2 28  GLUCOSE 96  BUN 18  CREATININE 1.05  CALCIUM 9.2  GFRNONAA 47*  GFRAA 55*   BNP (last 3 results)  Recent Labs  08/10/13 1235 09/06/13 0211 09/24/13 1459  PROBNP 955.0* 6779.0* 1238.0*    Radiology: Not done since admission  EKG: Atrial fibrillation with controlled response, IV conduction delay, left axis deviation, nonspecific ST changes, old anteroseptal infarction  Signed:  W. Ashley Royalty MD [facility]   Cardiology Consultant  02/13/2014, 4:51 PM

## 2014-02-13 NOTE — ED Notes (Signed)
Pt arrived from [facility]. Pt was evaluated here for fall yesterday with confirmed facial fx on right side of face. Pt was discharged yesterday and then today had another fall. Pt had a possible syncopal episode. Pt fell in bathroom and it was un witnessed. Pt does not remember falling. Family at bedside. Denies any pain. Pt did hit posterior right side of head.

## 2014-02-13 NOTE — ED Provider Notes (Signed)
CSN: [id number]     Arrival date & time 02/13/14  1404 History   First MD Initiated Contact with Patient 02/13/14 1511     Chief Complaint  Patient presents with  . Loss of Consciousness     (Consider location/radiation/quality/duration/timing/severity/associated sxs/prior Treatment) HPI  Patricia [name] is a 80 y.o. female who is here for evaluation of altered mental status, and possible syncope, as she was getting out of a shower, this morning, at 11 AM. She had eating breakfast prior to that. She does not recall any prodrome, while getting out of the shower. A family member was right outside the door; heard her fall, came in to the bathroom, and found her to be alert, but confused. The confusion lasted a short period of time. After that, she was able to equal, and her daughter, called for help. An the ambulance arrived and transferred her here for evaluation. I saw the patient in the emergency department last night after she was evaluated for a fall, with right orbital fracture and lacerations of the eyelids. There was no syncope causing a fall yesterday. Today, the patient reports that she feels better, and she was able to sleep normally last night. She feels that she bumped her head, in the fall, today. She denies weakness, dizziness, nausea, vomiting, neck or back pain. She is still unable to see out of her right eye since the contusion, yesterday. She is taking her usual medications. There are no other known modifying factors.   Past Medical History  Diagnosis Date  . CAD (coronary artery disease)   . Mitral valve disorder     a. s/p MVR;  b. 06/2013 Echo: EF 55-60%, no rwma, Ao sclerosis, mild AI, mild MR, miod dil LA, sev dil RV with reduced fxn, sev dil RA, mod TR/PR, PASP (study reviewed by Dr. Verlin Fester- MS felt to be sev, peak grad 39/mean 13).  [name] HTN (hypertension)   . Dyslipidemia   . Aortic stenosis, mild   . Breast cyst   . Hypertrophic cardiomyopathy   . Sleep apnea   . COPD  (chronic obstructive pulmonary disease)     "a touch of"; prn o2 at home with activity   . Atrial fibrillation     a. chronic coumadin.  [name] Hypothyroidism   . Anemia   . Chronic right-sided heart failure    Past Surgical History  Procedure Laterality Date  . Laparoscopic cholecystectomy  2009  . Cataract extraction  2003    bilateral  . Mitral valve replacement  2007  . Neck mass excision  2003    benign  . Colonoscopy    . Broken wrist      left, metal implanted   . Broken ankle      right, metal implanted   . Cardiac catheterization  06/2011  . Breast surgery      bilateral cysts x3 removed, benign   . Tee without cardioversion  01/31/2012    Procedure: TRANSESOPHAGEAL ECHOCARDIOGRAM (TEE);  Surgeon: Pricilla Riffle, MD;  Location: [facility] ENDOSCOPY;  Service: Cardiovascular;  Laterality: N/A;  . Cardioversion N/A 01/16/2013    Procedure: CARDIOVERSION;  Surgeon: Vesta Mixer, MD;  Location: [facility] ENDOSCOPY;  Service: Cardiovascular;  Laterality: N/A;   Family History  Problem Relation Age of Onset  . Diabetes Mother   . CVA Mother   . Other Father   . Diabetes Brother   . Cancer Brother     throat ca, smoker  . Aneurysm  Brother     brain aneurysm  . Breast cancer Sister   . Emphysema Sister     smoker   History  Substance Use Topics  . Smoking status: Former Smoker -- 1.00 packs/day for 40 years    Types: Cigarettes    Quit date: 05/21/1990  . Smokeless tobacco: Never Used  . Alcohol Use: No   OB History   Grav Para Term Preterm Abortions TAB SAB Ect Mult Living                 Review of Systems    Allergies  Ivp dye; Nitrofurantoin; and Ramipril  Home Medications   Prior to Admission medications   Medication Sig Start Date End Date Taking? Authorizing Provider  amiodarone (PACERONE) 100 MG tablet Take 100 mg by mouth daily.   Yes Historical Provider, MD  amoxicillin (AMOXIL) 500 MG capsule Take 2,000 mg by mouth once. One hour prior to dental appointment  02/09/14  Yes Historical Provider, MD  calcitonin, salmon, (MIACALCIN/FORTICAL) 200 UNIT/ACT nasal spray Place 1 spray into alternate nostrils daily. 01/23/14  Yes Historical Provider, MD  Calcium Carbonate-Vitamin D (CALCIUM 500 + D PO) Take 1 tablet by mouth 2 (two) times daily.    Yes Historical Provider, MD  cholecalciferol (VITAMIN D) 1000 UNITS tablet Take 1,000 Units by mouth at bedtime.    Yes Historical Provider, MD  ferrous sulfate 325 (65 FE) MG tablet Take 325 mg by mouth 2 (two) times a week. Tuesdays and Thursdays   Yes Historical Provider, MD  furosemide (LASIX) 20 MG tablet Take 1 tablet (20 mg total) by mouth daily. 11/09/13  Yes Pricilla Riffle, MD  levothyroxine (SYNTHROID, LEVOTHROID) 125 MCG tablet Take 62.5 mcg by mouth daily before breakfast.   Yes Historical Provider, MD  metoprolol tartrate (LOPRESSOR) 25 MG tablet Take 1 tablet (25 mg total) by mouth 2 (two) times daily. 09/21/13  Yes Pricilla Riffle, MD  potassium chloride SA (K-DUR,KLOR-CON) 20 MEQ tablet Take 20 mEq by mouth 2 (two) times daily.   Yes Historical Provider, MD  Probiotic Product (PROBIOTIC DAILY PO) Take 1 tablet by mouth daily.   Yes Historical Provider, MD  sertraline (ZOLOFT) 100 MG tablet Take 100 mg by mouth daily.   Yes Historical Provider, MD  nitroGLYCERIN (NITROSTAT) 0.4 MG SL tablet Place 0.4 mg under the tongue every 5 (five) minutes as needed for chest pain.    Historical Provider, MD   BP 134/62  Pulse 70  Temp(Src) 98.4 F (36.9 C) (Oral)  Resp 23  SpO2 93% Physical Exam  ED Course  Procedures (including critical care time)  Medications - No data to display  Patient Vitals for the past 24 hrs:  BP Temp Temp src Pulse Resp SpO2  02/13/14 1421 134/62 mmHg 98.4 F (36.9 C) Oral 70 23 93 %   16:20- case discussed with cardiology, Dr. Arlyn Leak, he will evaluate the patient for syncope, with attention to her cardiac health status.  4:28 PM Reevaluation with update and discussion. After initial  assessment and treatment, an updated evaluation reveals she remains comfortable, and has no further c/o. Cardiology consult done. Kaysea Raya L   5:08 PM-Consult complete with Dr. Jomarie Longs. Patient case explained and discussed. She agrees to admit patient for further evaluation and treatment. Call ended at 17:15  Labs Review Labs Reviewed  URINE CULTURE  CBC WITH DIFFERENTIAL  BASIC METABOLIC PANEL  URINALYSIS, ROUTINE W REFLEX MICROSCOPIC  PROTIME-INR    Imaging Review Ct  Head Wo Contrast  02/12/2014   CLINICAL DATA:  80 year old female with fall with head, face and neck injury and pain. Right high/orbit bruising.  EXAM: CT HEAD WITHOUT CONTRAST  CT MAXILLOFACIAL WITHOUT CONTRAST  CT CERVICAL SPINE WITHOUT CONTRAST  TECHNIQUE: Multidetector CT imaging of the head, cervical spine, and maxillofacial structures were performed using the standard protocol without intravenous contrast. Multiplanar CT image reconstructions of the cervical spine and maxillofacial structures were also generated.  COMPARISON:  08/20/2013 and prior CTs  FINDINGS: CT HEAD FINDINGS  Mild to moderate atrophy and chronic small-vessel white matter ischemic changes again noted. A remote right cerebellar infarct is noted.  No acute intracranial abnormalities are identified, including mass lesion or mass effect, hydrocephalus, extra-axial fluid collection, midline shift, hemorrhage, or acute infarction.  CT MAXILLOFACIAL FINDINGS  A right orbital floor fracture is identified with up to 8 mm displacement laterally.  Herniation of fat through the fracture site is noted without definite extraocular muscle herniation but correlate clinically. Soft tissue swelling overlying the right face and orbit noted.  The globes retain their spherical shape but there is proptosis on the right noted.  No other fracture, subluxation or dislocation identified.  The remainder the paranasal sinuses, mastoid air cells and middle/ inner ears are clear.  No  focal bony lesions are identified.  CT CERVICAL SPINE FINDINGS  Moderate apex left cervical scoliosis is again noted.  There is no evidence of acute fracture, subluxation or prevertebral soft swelling.  Mild to moderate multilevel degenerative disc disease, spondylosis and facet arthropathy again noted.  No focal bony lesions are present.  The soft tissue structures are unremarkable.  IMPRESSION: Right orbital floor fracture displaced up to 8 mm laterally. Herniation of fat without definite extraocular muscle herniation through the fracture site but correlate clinically.  No evidence of acute intracranial abnormality.  No static evidence of acute injury to the cervical spine.  Mild to moderate cerebral atrophy, chronic small-vessel white matter ischemic changes, remote right cerebellar infarct, and mild to moderate multilevel cervical spine degenerative changes.   Electronically Signed   By: Laveda Abbe M.D.   On: 02/12/2014 15:05   Ct Cervical Spine Wo Contrast  02/12/2014   CLINICAL DATA:  80 year old female with fall with head, face and neck injury and pain. Right high/orbit bruising.  EXAM: CT HEAD WITHOUT CONTRAST  CT MAXILLOFACIAL WITHOUT CONTRAST  CT CERVICAL SPINE WITHOUT CONTRAST  TECHNIQUE: Multidetector CT imaging of the head, cervical spine, and maxillofacial structures were performed using the standard protocol without intravenous contrast. Multiplanar CT image reconstructions of the cervical spine and maxillofacial structures were also generated.  COMPARISON:  08/20/2013 and prior CTs  FINDINGS: CT HEAD FINDINGS  Mild to moderate atrophy and chronic small-vessel white matter ischemic changes again noted. A remote right cerebellar infarct is noted.  No acute intracranial abnormalities are identified, including mass lesion or mass effect, hydrocephalus, extra-axial fluid collection, midline shift, hemorrhage, or acute infarction.  CT MAXILLOFACIAL FINDINGS  A right orbital floor fracture is identified  with up to 8 mm displacement laterally.  Herniation of fat through the fracture site is noted without definite extraocular muscle herniation but correlate clinically. Soft tissue swelling overlying the right face and orbit noted.  The globes retain their spherical shape but there is proptosis on the right noted.  No other fracture, subluxation or dislocation identified.  The remainder the paranasal sinuses, mastoid air cells and middle/ inner ears are clear.  No focal bony lesions are identified.  CT CERVICAL SPINE FINDINGS  Moderate apex left cervical scoliosis is again noted.  There is no evidence of acute fracture, subluxation or prevertebral soft swelling.  Mild to moderate multilevel degenerative disc disease, spondylosis and facet arthropathy again noted.  No focal bony lesions are present.  The soft tissue structures are unremarkable.  IMPRESSION: Right orbital floor fracture displaced up to 8 mm laterally. Herniation of fat without definite extraocular muscle herniation through the fracture site but correlate clinically.  No evidence of acute intracranial abnormality.  No static evidence of acute injury to the cervical spine.  Mild to moderate cerebral atrophy, chronic small-vessel white matter ischemic changes, remote right cerebellar infarct, and mild to moderate multilevel cervical spine degenerative changes.   Electronically Signed   By: Laveda Abbe M.D.   On: 02/12/2014 15:05   Ct Maxillofacial Wo Cm  02/12/2014   CLINICAL DATA:  80 year old female with fall with head, face and neck injury and pain. Right high/orbit bruising.  EXAM: CT HEAD WITHOUT CONTRAST  CT MAXILLOFACIAL WITHOUT CONTRAST  CT CERVICAL SPINE WITHOUT CONTRAST  TECHNIQUE: Multidetector CT imaging of the head, cervical spine, and maxillofacial structures were performed using the standard protocol without intravenous contrast. Multiplanar CT image reconstructions of the cervical spine and maxillofacial structures were also generated.   COMPARISON:  08/20/2013 and prior CTs  FINDINGS: CT HEAD FINDINGS  Mild to moderate atrophy and chronic small-vessel white matter ischemic changes again noted. A remote right cerebellar infarct is noted.  No acute intracranial abnormalities are identified, including mass lesion or mass effect, hydrocephalus, extra-axial fluid collection, midline shift, hemorrhage, or acute infarction.  CT MAXILLOFACIAL FINDINGS  A right orbital floor fracture is identified with up to 8 mm displacement laterally.  Herniation of fat through the fracture site is noted without definite extraocular muscle herniation but correlate clinically. Soft tissue swelling overlying the right face and orbit noted.  The globes retain their spherical shape but there is proptosis on the right noted.  No other fracture, subluxation or dislocation identified.  The remainder the paranasal sinuses, mastoid air cells and middle/ inner ears are clear.  No focal bony lesions are identified.  CT CERVICAL SPINE FINDINGS  Moderate apex left cervical scoliosis is again noted.  There is no evidence of acute fracture, subluxation or prevertebral soft swelling.  Mild to moderate multilevel degenerative disc disease, spondylosis and facet arthropathy again noted.  No focal bony lesions are present.  The soft tissue structures are unremarkable.  IMPRESSION: Right orbital floor fracture displaced up to 8 mm laterally. Herniation of fat without definite extraocular muscle herniation through the fracture site but correlate clinically.  No evidence of acute intracranial abnormality.  No static evidence of acute injury to the cervical spine.  Mild to moderate cerebral atrophy, chronic small-vessel white matter ischemic changes, remote right cerebellar infarct, and mild to moderate multilevel cervical spine degenerative changes.   Electronically Signed   By: Laveda Abbe M.D.   On: 02/12/2014 15:05     EKG Interpretation   Date/Time:  Saturday February 13 2014 14:19:00  EDT Ventricular Rate:  75 PR Interval:    QRS Duration: 132 QT Interval:  496 QTC Calculation: 554 R Axis:   -72 Text Interpretation:  Atrial fibrillation RBBB and LAFB Left ventricular  hypertrophy Anterior Q waves, possibly due to LVH Nonspecific T  abnormalities, lateral leads since last tracing no significant change  Confirmed by Effie Shy  MD, Lorelai Huyser ([id number]) on 02/13/2014 3:13:49 PM  MDM   Final diagnoses:  Head injury, initial encounter  Fall, initial encounter  Chronic atrial fibrillation  Thoracic aortic aneurysm  HOCM (hypertrophic obstructive cardiomyopathy)  Orbital wall fracture, with routine healing, subsequent encounter    Head injury today, and facial injury, after fall yesterday. No clear evidence for syncope on either event. Fall today was likely precipitated by relative hypotension, associated with showering. No serious injury or other causative factor discovered on the evaluation today. She has a complicated cardiac and vascular history, and now appears to require hospitalization for the possibility of worsening cardiac symptoms. She will require telemetry admission.  Nursing Notes Reviewed/ Care Coordinated, and agree without changes. Applicable Imaging Reviewed.  Interpretation of Laboratory Data incorporated into ED treatment  Plan: Admit   Flint Melter, MD 02/13/14 1726

## 2014-02-13 NOTE — H&P (Signed)
[facility] History and Physical  Patricia [name] ZOX:[id number] DOB: June 14, 1926 DOA: 02/13/2014  Referring physician: EDP PCP: Londell Moh, MD   Chief Complaint: passed out  HPI: Patricia [name] is a 80 y.o. female with a history of HOCM, Chronic Afib, mitral stenosis s/p MVR, mild CAD, secondary pulm HTN, chronic resp failure on 2.5L home O2, Mild COPD and Type B aortic dissection ( managed non operatively), also followed by Hospice currently a resident of [facility] was seen in the ER yetserday after what  seems like a syncopal episode, she was found to have significant bruising and echymosis around R eye and an orbital fracture, she was monitored in ER and discharged back after local wound care and Dermabond Rx of lacerations to FU with OMFS next week. Today while getting out of the shower she passed out and was found down this time hitting the middle of her head. She was sent to the ER again for eval and Seen by Cardiology who recommended admission per [facility] for observation on tele etc. She denies any chest pain, palpitations, no fever or chills. She reports chronic dyspnea on exertion at baseline   Review of Systems: positives bolded Constitutional:  No weight loss, night sweats, Fevers, chills, fatigue.  HEENT:  No headaches, Difficulty swallowing,Tooth/dental problems,Sore throat,  No sneezing, itching, ear ache, nasal congestion, post nasal drip,  Cardio-vascular:  No chest pain, Orthopnea, PND, swelling in lower extremities, anasarca, dizziness, palpitations  GI:  No heartburn, indigestion, abdominal pain, nausea, vomiting, diarrhea, change in bowel habits, loss of appetite  Resp:   shortness of breath with exertion or at rest. No excess mucus, no productive cough, No non-productive cough, No coughing up of blood.No change in color of mucus.No wheezing.No chest wall deformity  Skin:  no rash or lesions.  GU:  no dysuria, change in color of urine, no urgency or frequency.  No flank pain.  Musculoskeletal:  No joint pain or swelling. No decreased range of motion. No back pain.  Psych:  No change in mood or affect. No depression or anxiety. No memory loss.   Past Medical History  Diagnosis Date  . CAD (coronary artery disease)   . Mitral valve disorder     a. s/p MVR;  b. 06/2013 Echo: EF 55-60%, no rwma, Ao sclerosis, mild AI, mild MR, miod dil LA, sev dil RV with reduced fxn, sev dil RA, mod TR/PR, PASP (study reviewed by Dr. Verlin Fester- MS felt to be sev, peak grad 39/mean 13).  [name] HTN (hypertension)   . Dyslipidemia   . Aortic stenosis, mild   . Breast cyst   . Hypertrophic cardiomyopathy   . Sleep apnea   . COPD (chronic obstructive pulmonary disease)     "a touch of"; prn o2 at home with activity   . Atrial fibrillation     a. chronic coumadin.  [name] Hypothyroidism   . Anemia   . Chronic right-sided heart failure    Past Surgical History  Procedure Laterality Date  . Laparoscopic cholecystectomy  2009  . Cataract extraction  2003    bilateral  . Mitral valve replacement  2007  . Neck mass excision  2003    benign  . Colonoscopy    . Broken wrist      left, metal implanted   . Broken ankle      right, metal implanted   . Cardiac catheterization  06/2011  . Breast surgery      bilateral cysts x3 removed,  benign   . Tee without cardioversion  01/31/2012    Procedure: TRANSESOPHAGEAL ECHOCARDIOGRAM (TEE);  Surgeon: Pricilla Riffle, MD;  Location: [facility] ENDOSCOPY;  Service: Cardiovascular;  Laterality: N/A;  . Cardioversion N/A 01/16/2013    Procedure: CARDIOVERSION;  Surgeon: Vesta Mixer, MD;  Location[facility] ENDOSCOPY;  Service: Cardiovascular;  Laterality: N/A;   Social History:  reports that she quit smoking about 23 years ago. Her smoking use included Cigarettes. She has a 40 pack-year smoking history. She has never used smokeless tobacco. She reports that she does not drink alcohol or use illicit drugs.  Allergies  Allergen Reactions  . Ivp Dye  [Iodinated Diagnostic Agents] Itching  . Nitrofurantoin Itching  . Ramipril Other (See Comments)    unknown    Family History  Problem Relation Age of Onset  . Diabetes Mother   . CVA Mother   . Other Father   . Diabetes Brother   . Cancer Brother     throat ca, smoker  . Aneurysm Brother     brain aneurysm  . Breast cancer Sister   . Emphysema Sister     smoker     Prior to Admission medications   Medication Sig Start Date End Date Taking? Authorizing Provider  amiodarone (PACERONE) 100 MG tablet Take 100 mg by mouth daily.   Yes Historical Provider, MD  amoxicillin (AMOXIL) 500 MG capsule Take 2,000 mg by mouth once. One hour prior to dental appointment 02/09/14  Yes Historical Provider, MD  calcitonin, salmon, (MIACALCIN/FORTICAL) 200 UNIT/ACT nasal spray Place 1 spray into alternate nostrils daily. 01/23/14  Yes Historical Provider, MD  Calcium Carbonate-Vitamin D (CALCIUM 500 + D PO) Take 1 tablet by mouth 2 (two) times daily.    Yes Historical Provider, MD  cholecalciferol (VITAMIN D) 1000 UNITS tablet Take 1,000 Units by mouth at bedtime.    Yes Historical Provider, MD  ferrous sulfate 325 (65 FE) MG tablet Take 325 mg by mouth 2 (two) times a week. Tuesdays and Thursdays   Yes Historical Provider, MD  furosemide (LASIX) 20 MG tablet Take 1 tablet (20 mg total) by mouth daily. 11/09/13  Yes Pricilla Riffle, MD  levothyroxine (SYNTHROID, LEVOTHROID) 125 MCG tablet Take 62.5 mcg by mouth daily before breakfast.   Yes Historical Provider, MD  metoprolol tartrate (LOPRESSOR) 25 MG tablet Take 1 tablet (25 mg total) by mouth 2 (two) times daily. 09/21/13  Yes Pricilla Riffle, MD  potassium chloride SA (K-DUR,KLOR-CON) 20 MEQ tablet Take 20 mEq by mouth 2 (two) times daily.   Yes Historical Provider, MD  Probiotic Product (PROBIOTIC DAILY PO) Take 1 tablet by mouth daily.   Yes Historical Provider, MD  sertraline (ZOLOFT) 100 MG tablet Take 100 mg by mouth daily.   Yes Historical Provider, MD    nitroGLYCERIN (NITROSTAT) 0.4 MG SL tablet Place 0.4 mg under the tongue every 5 (five) minutes as needed for chest pain.    Historical Provider, MD   Physical Exam: Filed Vitals:   02/13/14 1421  BP: 134/62  Pulse: 70  Temp: 98.4 F (36.9 C)  TempSrc: Oral  Resp: 23  SpO2: 93%    Wt Readings from Last 3 Encounters:  09/29/13 69.4 kg (153 lb)  09/11/13 73.029 kg (161 lb)  09/10/13 72.576 kg (160 lb)    General:  Appears calm and comfortable, AAOx3 Eyes: bruising and swelling around R orbit with sub conj hge, 2 lacerations approximated with dermabond Vision and ocular movts intact ENT:  grossly normal  lips & tongue Neck: no LAD, masses or thyromegaly Cardiovascular: Irregular rate and rhythm, systolic murmur. No LE edema. Respiratory: CTA bilaterally, no w/r/r. Normal respiratory effort. Abdomen: soft, ntnd Skin: no rash or induration seen on limited exam Musculoskeletal: grossly normal tone BUE/BLE Psychiatric: grossly normal mood and affect, speech fluent and appropriate Neurologic: grossly non-focal.          Labs on Admission:  Basic Metabolic Panel:  Recent Labs Lab 02/12/14 1337 02/13/14 1544  NA 141 141  K 4.2 4.6  CL 103 101  CO2 27 28  GLUCOSE 101* 96  BUN 19 18  CREATININE 0.91 1.05  CALCIUM 8.9 9.2   Liver Function Tests: No results found for this basename: AST, ALT, ALKPHOS, BILITOT, PROT, ALBUMIN,  in the last 168 hours No results found for this basename: LIPASE, AMYLASE,  in the last 168 hours No results found for this basename: AMMONIA,  in the last 168 hours CBC:  Recent Labs Lab 02/12/14 1337 02/13/14 1544  WBC 10.1 9.0  NEUTROABS  --  7.2  HGB 11.8* 11.3*  HCT 38.1 37.0  MCV 90.5 89.4  PLT 104* 107*   Cardiac Enzymes: No results found for this basename: CKTOTAL, CKMB, CKMBINDEX, TROPONINI,  in the last 168 hours  BNP (last 3 results)  Recent Labs  08/10/13 1235 09/06/13 0211 09/24/13 1459  PROBNP 955.0* 6779.0* 1238.0*    CBG: No results found for this basename: GLUCAP,  in the last 168 hours  Radiological Exams on Admission: Ct Head Wo Contrast  02/12/2014   CLINICAL DATA:  80 year old female with fall with head, face and neck injury and pain. Right high/orbit bruising.  EXAM: CT HEAD WITHOUT CONTRAST  CT MAXILLOFACIAL WITHOUT CONTRAST  CT CERVICAL SPINE WITHOUT CONTRAST  TECHNIQUE: Multidetector CT imaging of the head, cervical spine, and maxillofacial structures were performed using the standard protocol without intravenous contrast. Multiplanar CT image reconstructions of the cervical spine and maxillofacial structures were also generated.  COMPARISON:  08/20/2013 and prior CTs  FINDINGS: CT HEAD FINDINGS  Mild to moderate atrophy and chronic small-vessel white matter ischemic changes again noted. A remote right cerebellar infarct is noted.  No acute intracranial abnormalities are identified, including mass lesion or mass effect, hydrocephalus, extra-axial fluid collection, midline shift, hemorrhage, or acute infarction.  CT MAXILLOFACIAL FINDINGS  A right orbital floor fracture is identified with up to 8 mm displacement laterally.  Herniation of fat through the fracture site is noted without definite extraocular muscle herniation but correlate clinically. Soft tissue swelling overlying the right face and orbit noted.  The globes retain their spherical shape but there is proptosis on the right noted.  No other fracture, subluxation or dislocation identified.  The remainder the paranasal sinuses, mastoid air cells and middle/ inner ears are clear.  No focal bony lesions are identified.  CT CERVICAL SPINE FINDINGS  Moderate apex left cervical scoliosis is again noted.  There is no evidence of acute fracture, subluxation or prevertebral soft swelling.  Mild to moderate multilevel degenerative disc disease, spondylosis and facet arthropathy again noted.  No focal bony lesions are present.  The soft tissue structures are  unremarkable.  IMPRESSION: Right orbital floor fracture displaced up to 8 mm laterally. Herniation of fat without definite extraocular muscle herniation through the fracture site but correlate clinically.  No evidence of acute intracranial abnormality.  No static evidence of acute injury to the cervical spine.  Mild to moderate cerebral atrophy, chronic small-vessel white  matter ischemic changes, remote right cerebellar infarct, and mild to moderate multilevel cervical spine degenerative changes.   Electronically Signed   By: Laveda Abbe M.D.   On: 02/12/2014 15:05   Ct Cervical Spine Wo Contrast  02/12/2014   CLINICAL DATA:  80 year old female with fall with head, face and neck injury and pain. Right high/orbit bruising.  EXAM: CT HEAD WITHOUT CONTRAST  CT MAXILLOFACIAL WITHOUT CONTRAST  CT CERVICAL SPINE WITHOUT CONTRAST  TECHNIQUE: Multidetector CT imaging of the head, cervical spine, and maxillofacial structures were performed using the standard protocol without intravenous contrast. Multiplanar CT image reconstructions of the cervical spine and maxillofacial structures were also generated.  COMPARISON:  08/20/2013 and prior CTs  FINDINGS: CT HEAD FINDINGS  Mild to moderate atrophy and chronic small-vessel white matter ischemic changes again noted. A remote right cerebellar infarct is noted.  No acute intracranial abnormalities are identified, including mass lesion or mass effect, hydrocephalus, extra-axial fluid collection, midline shift, hemorrhage, or acute infarction.  CT MAXILLOFACIAL FINDINGS  A right orbital floor fracture is identified with up to 8 mm displacement laterally.  Herniation of fat through the fracture site is noted without definite extraocular muscle herniation but correlate clinically. Soft tissue swelling overlying the right face and orbit noted.  The globes retain their spherical shape but there is proptosis on the right noted.  No other fracture, subluxation or dislocation identified.   The remainder the paranasal sinuses, mastoid air cells and middle/ inner ears are clear.  No focal bony lesions are identified.  CT CERVICAL SPINE FINDINGS  Moderate apex left cervical scoliosis is again noted.  There is no evidence of acute fracture, subluxation or prevertebral soft swelling.  Mild to moderate multilevel degenerative disc disease, spondylosis and facet arthropathy again noted.  No focal bony lesions are present.  The soft tissue structures are unremarkable.  IMPRESSION: Right orbital floor fracture displaced up to 8 mm laterally. Herniation of fat without definite extraocular muscle herniation through the fracture site but correlate clinically.  No evidence of acute intracranial abnormality.  No static evidence of acute injury to the cervical spine.  Mild to moderate cerebral atrophy, chronic small-vessel white matter ischemic changes, remote right cerebellar infarct, and mild to moderate multilevel cervical spine degenerative changes.   Electronically Signed   By: Laveda Abbe M.D.   On: 02/12/2014 15:05   Ct Maxillofacial Wo Cm  02/12/2014   CLINICAL DATA:  80 year old female with fall with head, face and neck injury and pain. Right high/orbit bruising.  EXAM: CT HEAD WITHOUT CONTRAST  CT MAXILLOFACIAL WITHOUT CONTRAST  CT CERVICAL SPINE WITHOUT CONTRAST  TECHNIQUE: Multidetector CT imaging of the head, cervical spine, and maxillofacial structures were performed using the standard protocol without intravenous contrast. Multiplanar CT image reconstructions of the cervical spine and maxillofacial structures were also generated.  COMPARISON:  08/20/2013 and prior CTs  FINDINGS: CT HEAD FINDINGS  Mild to moderate atrophy and chronic small-vessel white matter ischemic changes again noted. A remote right cerebellar infarct is noted.  No acute intracranial abnormalities are identified, including mass lesion or mass effect, hydrocephalus, extra-axial fluid collection, midline shift, hemorrhage, or acute  infarction.  CT MAXILLOFACIAL FINDINGS  A right orbital floor fracture is identified with up to 8 mm displacement laterally.  Herniation of fat through the fracture site is noted without definite extraocular muscle herniation but correlate clinically. Soft tissue swelling overlying the right face and orbit noted.  The globes retain their spherical shape but there is proptosis  on the right noted.  No other fracture, subluxation or dislocation identified.  The remainder the paranasal sinuses, mastoid air cells and middle/ inner ears are clear.  No focal bony lesions are identified.  CT CERVICAL SPINE FINDINGS  Moderate apex left cervical scoliosis is again noted.  There is no evidence of acute fracture, subluxation or prevertebral soft swelling.  Mild to moderate multilevel degenerative disc disease, spondylosis and facet arthropathy again noted.  No focal bony lesions are present.  The soft tissue structures are unremarkable.  IMPRESSION: Right orbital floor fracture displaced up to 8 mm laterally. Herniation of fat without definite extraocular muscle herniation through the fracture site but correlate clinically.  No evidence of acute intracranial abnormality.  No static evidence of acute injury to the cervical spine.  Mild to moderate cerebral atrophy, chronic small-vessel white matter ischemic changes, remote right cerebellar infarct, and mild to moderate multilevel cervical spine degenerative changes.   Electronically Signed   By: Laveda Abbe M.D.   On: 02/12/2014 15:05    EKG: Independently reviewed: .Atrial fibrillation , nonspecific ST changes, old anteroseptal infarction   Assessment/Plan    Syncope and collapse -suspect arrhythmia related -monitor on tele, check Orthostatics -Troponin x2 sets -repeat CT head today    Hypertrophic obstructive cardiomyopathy(425.11)/chronic diastolic CHF -last ECHo 2/15 with normal EF, mild AR, mild MR and moderate TR, dilated Rv and severe pulm HTN -volume status  appears even -hold lasix, check BNP    Chronic atrial fibrillation -rate controlled -continue amiodarone and PO metoprolol -not on anticoagulation due to dissection    Pulmonary hypertension, secondary/chronic resp failure -on 2.5L home O2    Descending thoracic dissection -being managed non operatively -check CXR -followed by Hospice  R orbital floor fracture and peri-orbital laceration -noted after syncope yetserday -vision and ocular movts intact -lacerations approximated with dermabond -FU with Dr.Schumaker, OMFS next week  Hypothyroidism -continue synthroid    Code Status: DNR DVT Prophylaxis: SCDs Family Communication: d/w daughter at bedside Disposition Plan: inpatient  Time spent:  [facility] Pager [phone]

## 2014-02-14 DIAGNOSIS — I71019 Dissection of thoracic aorta, unspecified: Secondary | ICD-10-CM

## 2014-02-14 DIAGNOSIS — I7101 Dissection of thoracic aorta: Secondary | ICD-10-CM

## 2014-02-14 DIAGNOSIS — I5032 Chronic diastolic (congestive) heart failure: Secondary | ICD-10-CM

## 2014-02-14 DIAGNOSIS — I359 Nonrheumatic aortic valve disorder, unspecified: Secondary | ICD-10-CM

## 2014-02-14 LAB — CBC
HEMATOCRIT: 35 % — AB (ref 36.0–46.0)
HEMOGLOBIN: 10.5 g/dL — AB (ref 12.0–15.0)
MCH: 26.9 pg (ref 26.0–34.0)
MCHC: 30 g/dL (ref 30.0–36.0)
MCV: 89.7 fL (ref 78.0–100.0)
Platelets: 94 10*3/uL — ABNORMAL LOW (ref 150–400)
RBC: 3.9 MIL/uL (ref 3.87–5.11)
RDW: 17.2 % — ABNORMAL HIGH (ref 11.5–15.5)
WBC: 8.1 10*3/uL (ref 4.0–10.5)

## 2014-02-14 LAB — COMPREHENSIVE METABOLIC PANEL
ALK PHOS: 78 U/L (ref 39–117)
ALT: 19 U/L (ref 0–35)
AST: 25 U/L (ref 0–37)
Albumin: 2.9 g/dL — ABNORMAL LOW (ref 3.5–5.2)
Anion gap: 12 (ref 5–15)
BUN: 20 mg/dL (ref 6–23)
CO2: 27 mEq/L (ref 19–32)
Calcium: 8.7 mg/dL (ref 8.4–10.5)
Chloride: 100 mEq/L (ref 96–112)
Creatinine, Ser: 0.92 mg/dL (ref 0.50–1.10)
GFR calc non Af Amer: 56 mL/min — ABNORMAL LOW (ref >90)
GFR, EST AFRICAN AMERICAN: 64 mL/min — AB (ref >90)
GLUCOSE: 81 mg/dL (ref 70–99)
Potassium: 4.6 mEq/L (ref 3.7–5.3)
Sodium: 139 mEq/L (ref 137–147)
Total Bilirubin: 0.8 mg/dL (ref 0.3–1.2)
Total Protein: 6.3 g/dL (ref 6.0–8.3)

## 2014-02-14 LAB — TROPONIN I: Troponin I: <0.3 ng/mL (ref <0.30)

## 2014-02-14 MED ORDER — OFF THE BEAT BOOK
Freq: Once | Status: AC
  Administered 2014-02-14: 02:00:00
  Filled 2014-02-14: qty 1

## 2014-02-14 MED ORDER — CETYLPYRIDINIUM CHLORIDE 0.05 % MT LIQD
7.0000 mL | Freq: Two times a day (BID) | OROMUCOSAL | Status: DC
  Administered 2014-02-14: 7 mL via OROMUCOSAL

## 2014-02-14 MED ORDER — FUROSEMIDE 10 MG/ML IJ SOLN
20.0000 mg | Freq: Two times a day (BID) | INTRAMUSCULAR | Status: DC
Start: 2014-02-14 — End: 2014-02-15
  Administered 2014-02-14 – 2014-02-15 (×3): 20 mg via INTRAVENOUS
  Filled 2014-02-14 (×4): qty 2

## 2014-02-14 NOTE — Progress Notes (Addendum)
[facility] PROGRESS NOTE  Patricia [name] ZOX:[id number] DOB: May 19, 1927 DOA: 02/13/2014 PCP: Londell Moh, MD  Assessment/Plan:  Syncope and collapse  -suspect arrhythmia related  -Tele with PVCs, afib, Orthostatics negative -Troponin x2 negative -repeat CT head stable   Acute on chronic Diastolic CHF/ Hypertrophic obstructive cardiomyopathy -last ECHo 2/15 with normal EF, mild AR, mild MR and moderate TR, dilated Rv and severe pulm HTN  -BNP >6K and CXr with some pulm edema -low dose IV lasix today  q12   Chronic atrial fibrillation  -rate controlled  -continue amiodarone and PO metoprolol  -not on anticoagulation due to dissection   Pulmonary hypertension, secondary/chronic resp failure  -on 2.5L home O2   Descending thoracic dissection  -being managed non operatively  -followed by Hospice   R orbital floor fracture and peri-orbital laceration  -noted after syncope 9/25  -vision and ocular movts intact  -lacerations approximated with dermabond  -FU with Dr.Schumaker, OMFS next week   Hypothyroidism  -continue synthroid   DVt proph: SCDs  Code Status: DNR Family Communication: d/w daughter at bedside Disposition Plan: back to ALF in 1-2days   Consultants:  Cards  HPI/Subjective: Feels ok, some dyspnea with exertion  Objective: Filed Vitals:   02/14/14 0635  BP: 131/74  Pulse: 84  Temp: 97.9 F (36.6 C)  Resp: 18    Intake/Output Summary (Last 24 hours) at 02/14/14 0922 Last data filed at 02/14/14 0650  Gross per 24 hour  Intake    240 ml  Output    250 ml  Net    -10 ml   Filed Weights   02/13/14 2023 02/14/14 0635  Weight: 70.4 kg (155 lb 3.3 oz) 70.262 kg (154 lb 14.4 oz)    Exam:   General:  AAOx3,   HEENT: bruising and ecchymosis around R eye much improved  Cardiovascular: S1S2/RRR  Respiratory: CTAB  Abdomen: soft, Nt, BS present  Musculoskeletal: trace edema   Data Reviewed: Basic Metabolic  Panel:  Recent Labs Lab 02/12/14 1337 02/13/14 1544 02/14/14 0446  NA 141 141 139  K 4.2 4.6 4.6  CL 103 101 100  CO2 GLUCOSE 101* 96 81  BUN CREATININE 0.91 1.05 0.92  CALCIUM 8.9 9.2 8.7   Liver Function Tests:  Recent Labs Lab 02/14/14 0446  AST 25  ALT 19  ALKPHOS 78  BILITOT 0.8  PROT 6.3  ALBUMIN 2.9*   No results found for this basename: LIPASE, AMYLASE,  in the last 168 hours No results found for this basename: AMMONIA,  in the last 168 hours CBC:  Recent Labs Lab 02/12/14 1337 02/13/14 1544 02/14/14 0446  WBC 10.1 9.0 8.1  NEUTROABS  --  7.2  --   HGB 11.8* 11.3* 10.5*  HCT 38.1 37.0 35.0*  MCV 90.5 89.4 89.7  PLT 104* 107* 94*   Cardiac Enzymes:  Recent Labs Lab 02/13/14 2045 02/14/14 0117  TROPONINI <0.30 <0.30   BNP (last 3 results)  Recent Labs  09/06/13 0211 09/24/13 1459 02/13/14 2045  PROBNP 6779.0* 1238.0* 6333.0*   CBG: No results found for this basename: GLUCAP,  in the last 168 hours  No results found for this or any previous visit (from the past 240 hour(s)).   Studies: Dg Chest 2 View  02/13/2014   CLINICAL DATA:  Fall yesterday. Hit her head. History of CHF, valve replacement. Oxygen requirement.  EXAM: CHEST  2 VIEW  COMPARISON:  09/06/2013, 08/20/2013  FINDINGS:  The heart is enlarged. Patient has had median sternotomy and valve replacement. There is prominence of the pulmonary arteries segments. Perihilar airspace filling is noted bilaterally, right greater than left. Findings are consistent with pulmonary edema. There are no focal consolidations. Suspect small bilateral pleural effusions. There has been further compression of T12 since prior studies.  IMPRESSION: 1. Cardiomegaly and pulmonary edema. 2. Further compression of T12.   Electronically Signed   By: Rosalie Gums M.D.   On: 02/13/2014 18:28   Ct Head Wo Contrast  02/13/2014   CLINICAL DATA:  Hit head after fall. Recent right orbital floor  fracture.  EXAM: CT HEAD WITHOUT CONTRAST  TECHNIQUE: Contiguous axial images were obtained from the base of the skull through the vertex without contrast.  COMPARISON:  Head CT 02/12/2014  FINDINGS: Stable cerebral atrophy. Diffuse low-density in the periventricular and subcortical white matter suggests chronic changes. Again noted are left basal ganglia calcifications. No evidence for acute hemorrhage, mass lesion, midline shift, hydrocephalus or large infarct. Again noted is soft tissue swelling around the right orbit. Changes consistent with a right orbital floor fracture. There is increased soft tissue swelling around the right orbit compared to the previous examination. There may be increased blood products in right maxillary sinus related to the orbital floor fracture.  IMPRESSION: No acute intracranial abnormality.  Atrophy and evidence for chronic small vessel ischemic changes.  Changes consistent with a right orbital floor fracture. Increased soft tissue swelling around the right orbit. Increased blood products in the right maxillary sinus region.   Electronically Signed   By: Richarda Overlie M.D.   On: 02/13/2014 17:55   Ct Head Wo Contrast  02/12/2014   CLINICAL DATA:  80 year old female with fall with head, face and neck injury and pain. Right high/orbit bruising.  EXAM: CT HEAD WITHOUT CONTRAST  CT MAXILLOFACIAL WITHOUT CONTRAST  CT CERVICAL SPINE WITHOUT CONTRAST  TECHNIQUE: Multidetector CT imaging of the head, cervical spine, and maxillofacial structures were performed using the standard protocol without intravenous contrast. Multiplanar CT image reconstructions of the cervical spine and maxillofacial structures were also generated.  COMPARISON:  08/20/2013 and prior CTs  FINDINGS: CT HEAD FINDINGS  Mild to moderate atrophy and chronic small-vessel white matter ischemic changes again noted. A remote right cerebellar infarct is noted.  No acute intracranial abnormalities are identified, including mass  lesion or mass effect, hydrocephalus, extra-axial fluid collection, midline shift, hemorrhage, or acute infarction.  CT MAXILLOFACIAL FINDINGS  A right orbital floor fracture is identified with up to 8 mm displacement laterally.  Herniation of fat through the fracture site is noted without definite extraocular muscle herniation but correlate clinically. Soft tissue swelling overlying the right face and orbit noted.  The globes retain their spherical shape but there is proptosis on the right noted.  No other fracture, subluxation or dislocation identified.  The remainder the paranasal sinuses, mastoid air cells and middle/ inner ears are clear.  No focal bony lesions are identified.  CT CERVICAL SPINE FINDINGS  Moderate apex left cervical scoliosis is again noted.  There is no evidence of acute fracture, subluxation or prevertebral soft swelling.  Mild to moderate multilevel degenerative disc disease, spondylosis and facet arthropathy again noted.  No focal bony lesions are present.  The soft tissue structures are unremarkable.  IMPRESSION: Right orbital floor fracture displaced up to 8 mm laterally. Herniation of fat without definite extraocular muscle herniation through the fracture site but correlate clinically.  No evidence of acute intracranial abnormality.  No static evidence of acute injury to the cervical spine.  Mild to moderate cerebral atrophy, chronic small-vessel white matter ischemic changes, remote right cerebellar infarct, and mild to moderate multilevel cervical spine degenerative changes.   Electronically Signed   By: Laveda Abbe M.D.   On: 02/12/2014 15:05   Ct Cervical Spine Wo Contrast  02/12/2014   CLINICAL DATA:  80 year old female with fall with head, face and neck injury and pain. Right high/orbit bruising.  EXAM: CT HEAD WITHOUT CONTRAST  CT MAXILLOFACIAL WITHOUT CONTRAST  CT CERVICAL SPINE WITHOUT CONTRAST  TECHNIQUE: Multidetector CT imaging of the head, cervical spine, and maxillofacial  structures were performed using the standard protocol without intravenous contrast. Multiplanar CT image reconstructions of the cervical spine and maxillofacial structures were also generated.  COMPARISON:  08/20/2013 and prior CTs  FINDINGS: CT HEAD FINDINGS  Mild to moderate atrophy and chronic small-vessel white matter ischemic changes again noted. A remote right cerebellar infarct is noted.  No acute intracranial abnormalities are identified, including mass lesion or mass effect, hydrocephalus, extra-axial fluid collection, midline shift, hemorrhage, or acute infarction.  CT MAXILLOFACIAL FINDINGS  A right orbital floor fracture is identified with up to 8 mm displacement laterally.  Herniation of fat through the fracture site is noted without definite extraocular muscle herniation but correlate clinically. Soft tissue swelling overlying the right face and orbit noted.  The globes retain their spherical shape but there is proptosis on the right noted.  No other fracture, subluxation or dislocation identified.  The remainder the paranasal sinuses, mastoid air cells and middle/ inner ears are clear.  No focal bony lesions are identified.  CT CERVICAL SPINE FINDINGS  Moderate apex left cervical scoliosis is again noted.  There is no evidence of acute fracture, subluxation or prevertebral soft swelling.  Mild to moderate multilevel degenerative disc disease, spondylosis and facet arthropathy again noted.  No focal bony lesions are present.  The soft tissue structures are unremarkable.  IMPRESSION: Right orbital floor fracture displaced up to 8 mm laterally. Herniation of fat without definite extraocular muscle herniation through the fracture site but correlate clinically.  No evidence of acute intracranial abnormality.  No static evidence of acute injury to the cervical spine.  Mild to moderate cerebral atrophy, chronic small-vessel white matter ischemic changes, remote right cerebellar infarct, and mild to moderate  multilevel cervical spine degenerative changes.   Electronically Signed   By: Laveda Abbe M.D.   On: 02/12/2014 15:05   Ct Maxillofacial Wo Cm  02/12/2014   CLINICAL DATA:  80 year old female with fall with head, face and neck injury and pain. Right high/orbit bruising.  EXAM: CT HEAD WITHOUT CONTRAST  CT MAXILLOFACIAL WITHOUT CONTRAST  CT CERVICAL SPINE WITHOUT CONTRAST  TECHNIQUE: Multidetector CT imaging of the head, cervical spine, and maxillofacial structures were performed using the standard protocol without intravenous contrast. Multiplanar CT image reconstructions of the cervical spine and maxillofacial structures were also generated.  COMPARISON:  08/20/2013 and prior CTs  FINDINGS: CT HEAD FINDINGS  Mild to moderate atrophy and chronic small-vessel white matter ischemic changes again noted. A remote right cerebellar infarct is noted.  No acute intracranial abnormalities are identified, including mass lesion or mass effect, hydrocephalus, extra-axial fluid collection, midline shift, hemorrhage, or acute infarction.  CT MAXILLOFACIAL FINDINGS  A right orbital floor fracture is identified with up to 8 mm displacement laterally.  Herniation of fat through the fracture site is noted without definite extraocular muscle herniation but correlate clinically. Soft tissue  swelling overlying the right face and orbit noted.  The globes retain their spherical shape but there is proptosis on the right noted.  No other fracture, subluxation or dislocation identified.  The remainder the paranasal sinuses, mastoid air cells and middle/ inner ears are clear.  No focal bony lesions are identified.  CT CERVICAL SPINE FINDINGS  Moderate apex left cervical scoliosis is again noted.  There is no evidence of acute fracture, subluxation or prevertebral soft swelling.  Mild to moderate multilevel degenerative disc disease, spondylosis and facet arthropathy again noted.  No focal bony lesions are present.  The soft tissue structures  are unremarkable.  IMPRESSION: Right orbital floor fracture displaced up to 8 mm laterally. Herniation of fat without definite extraocular muscle herniation through the fracture site but correlate clinically.  No evidence of acute intracranial abnormality.  No static evidence of acute injury to the cervical spine.  Mild to moderate cerebral atrophy, chronic small-vessel white matter ischemic changes, remote right cerebellar infarct, and mild to moderate multilevel cervical spine degenerative changes.   Electronically Signed   By: Laveda Abbe M.D.   On: 02/12/2014 15:05    Scheduled Meds: . amiodarone  100 mg Oral Daily  . antiseptic oral rinse  7 mL Mouth Rinse BID  . calcitonin (salmon)  1 spray Alternating Nares Daily  . cholecalciferol  1,000 Units Oral QHS  . levothyroxine  62.5 mcg Oral QAC breakfast  . metoprolol tartrate  25 mg Oral BID  . sertraline  100 mg Oral Daily   Continuous Infusions:  Antibiotics Given (last 72 hours)   None      Principal Problem:   Syncope and collapse Active Problems:   Hypertensive heart disease   Hypertrophic obstructive cardiomyopathy(425.11)   Chronic atrial fibrillation   History of mitral valve replacement with bioprosthetic valve   Pulmonary hypertension, secondary   Chronic diastolic heart failure   Descending thoracic dissection   At high risk for falls   DNR (do not resuscitate)   Syncope   Head injuries    Time spent:    [facility]  [facility] Pager [phone]. If 7PM-7AM, please contact night-coverage at [email or web address], password [facility] 02/14/2014, 9:22 AM  LOS: 1 day

## 2014-02-14 NOTE — Progress Notes (Signed)
Subjective:  Feeling better today. Stronger and was able to be up to the bedside commode. No recurrent syncope. Not short of breath today. No chest pain.  Objective:  Vital Signs in the last 24 hours: BP 116/75  Pulse 65  Temp(Src) 97.9 F (36.6 C) (Oral)  Resp 18  Ht  (1.626 m)  Wt 70.262 kg (154 lb 14.4 oz)  BMI 26.58 kg/m2  SpO2 93%  Physical Exam: Elderly white female in no acute distress, large ecchymoses over eye on right Lungs: Rales at bases Cardiac: Irregular  rhythm, normal S1 and S2, no S3, 1-2/6 murmur Extremities:  Trace edema present  Intake/Output from previous day: 09/26 0701 - 09/27 0700 In: 240 [P.O.:240] Out: 250 [Urine:250] Weight Filed Weights   02/13/14 2023 02/14/14 0635  Weight: 70.4 kg (155 lb 3.3 oz) 70.262 kg (154 lb 14.4 oz)    Lab Results: Basic Metabolic Panel:  Recent Labs  29/56/21 1544 02/14/14 0446  NA 141 139  K 4.6 4.6  CL 101 100  CO2 28 27  GLUCOSE 96 81  BUN 18 20  CREATININE 1.05 0.92    CBC:  Recent Labs  02/13/14 1544 02/14/14 0446  WBC 9.0 8.1  NEUTROABS 7.2  --   HGB 11.3* 10.5*  HCT 37.0 35.0*  MCV 89.4 89.7  PLT 107* 94*    BNP    Component Value Date/Time   PROBNP 6333.0* 02/13/2014 2045   Telemetry: Atrial fibrillation with controlled response  Assessment/Plan:  1. Recent episodes of falls-it is uncertain whether this was cardiac syncope due to arrhythmia or blood pressure or was just a simple mechanical fall due to abnormal gait and loss of balance 2. Diastolic congestive heart failure due to mitral stenosis and previous hypertrophic cardiomyopathy 3. Chronic atrial fibrillation not good candidate for anticoagulation 4. Recent eye injury 5. Sleep apnea   Recommendations:  Chest x-ray done after I saw her yesterday did show pulmonary edema and her BNP was up to 6000. Furosemide is reasonable but need to be careful in light of  hypertrophic cardiomyopathy.   It would be reasonable to  get physical therapy to see her today and walker. Observe in hospital overnight.     Darden Palmer  MD [facility]  02/14/2014, 10:25 AM

## 2014-02-15 ENCOUNTER — Telehealth: Payer: Self-pay | Admitting: Emergency Medicine

## 2014-02-15 DIAGNOSIS — I2789 Other specified pulmonary heart diseases: Secondary | ICD-10-CM

## 2014-02-15 DIAGNOSIS — I050 Rheumatic mitral stenosis: Secondary | ICD-10-CM | POA: Diagnosis present

## 2014-02-15 DIAGNOSIS — I4891 Unspecified atrial fibrillation: Secondary | ICD-10-CM | POA: Diagnosis present

## 2014-02-15 DIAGNOSIS — G4733 Obstructive sleep apnea (adult) (pediatric): Secondary | ICD-10-CM

## 2014-02-15 DIAGNOSIS — R55 Syncope and collapse: Secondary | ICD-10-CM

## 2014-02-15 DIAGNOSIS — I5033 Acute on chronic diastolic (congestive) heart failure: Secondary | ICD-10-CM | POA: Diagnosis present

## 2014-02-15 DIAGNOSIS — I509 Heart failure, unspecified: Secondary | ICD-10-CM

## 2014-02-15 DIAGNOSIS — I421 Obstructive hypertrophic cardiomyopathy: Secondary | ICD-10-CM

## 2014-02-15 LAB — BASIC METABOLIC PANEL
ANION GAP: 11 (ref 5–15)
BUN: 19 mg/dL (ref 6–23)
CO2: 29 meq/L (ref 19–32)
Calcium: 8.3 mg/dL — ABNORMAL LOW (ref 8.4–10.5)
Chloride: 100 mEq/L (ref 96–112)
Creatinine, Ser: 0.89 mg/dL (ref 0.50–1.10)
GFR calc Af Amer: 67 mL/min — ABNORMAL LOW (ref >90)
GFR, EST NON AFRICAN AMERICAN: 58 mL/min — AB (ref >90)
Glucose, Bld: 99 mg/dL (ref 70–99)
Potassium: 3.7 mEq/L (ref 3.7–5.3)
Sodium: 140 mEq/L (ref 137–147)

## 2014-02-15 LAB — URINE CULTURE: Colony Count: 9000

## 2014-02-15 NOTE — Progress Notes (Addendum)
SUBJECTIVE:  No complaints - wants to go home  OBJECTIVE:   Vitals:   Filed Vitals:   02/14/14 1356 02/14/14 1955 02/15/14 0404 02/15/14 0913  BP: 114/63  125/72   Pulse: 62  57   Temp: 98.6 F (37 C) 98.2 F (36.8 C) 97.7 F (36.5 C)   TempSrc: Oral Oral Oral   Resp: Height:      Weight:   152 lb 1.9 oz (69 kg)   SpO2: 95% 94% 95% 96%   I&O's:   Intake/Output Summary (Last 24 hours) at 02/15/14 0932 Last data filed at 02/15/14 2952  Gross per 24 hour  Intake    960 ml  Output   2875 ml  Net  -1915 ml   TELEMETRY: Reviewed telemetry pt in atrial fibrillation     PHYSICAL EXAM General: Well developed, well nourished, in no acute distress Head: Eyes PERRLA, No xanthomas.   Normal cephalic and atramatic  Lungs:   Clear bilaterally to auscultation and percussion. Heart:   Irregularly irregular S1 S2 Pulses are 2+ & equal.. Abdomen: Bowel sounds are positive, abdomen soft and non-tender without masses  Extremities:   No clubbing, cyanosis or edema.  DP +1 Neuro: Alert and oriented X 3. Psych:  Good affect, responds appropriately   LABS: Basic Metabolic Panel:  Recent Labs  84/13/24 0446 02/15/14 0036  NA 139 140  K 4.6 3.7  CL 100 100  CO2 27 29  GLUCOSE 81 99  BUN 20 19  CREATININE 0.92 0.89  CALCIUM 8.7 8.3*   Liver Function Tests:  Recent Labs  02/14/14 0446  AST 25  ALT 19  ALKPHOS 78  BILITOT 0.8  PROT 6.3  ALBUMIN 2.9*   No results found for this basename: LIPASE, AMYLASE,  in the last 72 hours CBC:  Recent Labs  02/13/14 1544 02/14/14 0446  WBC 9.0 8.1  NEUTROABS 7.2  --   HGB 11.3* 10.5*  HCT 37.0 35.0*  MCV 89.4 89.7  PLT 107* 94*   Cardiac Enzymes:  Recent Labs  02/13/14 2045 02/14/14 0117  TROPONINI <0.30 <0.30   BNP: No components found with this basename: POCBNP,  D-Dimer: No results found for this basename: DDIMER,  in the last 72 hours Hemoglobin A1C: No results found for this basename: HGBA1C,   in the last 72 hours Fasting Lipid Panel: No results found for this basename: CHOL, HDL, LDLCALC, TRIG, CHOLHDL, LDLDIRECT,  in the last 72 hours Thyroid Function Tests: No results found for this basename: TSH, T4TOTAL, FREET3, T3FREE, THYROIDAB,  in the last 72 hours Anemia Panel: No results found for this basename: VITAMINB12, FOLATE, FERRITIN, TIBC, IRON, RETICCTPCT,  in the last 72 hours Coag Panel:   Lab Results  Component Value Date   INR 1.25 02/13/2014   INR 1.25 02/12/2014   INR 2.05* 08/20/2013    RADIOLOGY: Dg Chest 2 View  02/13/2014   CLINICAL DATA:  Fall yesterday. Hit her head. History of CHF, valve replacement. Oxygen requirement.  EXAM: CHEST  2 VIEW  COMPARISON:  09/06/2013, 08/20/2013  FINDINGS: The heart is enlarged. Patient has had median sternotomy and valve replacement. There is prominence of the pulmonary arteries segments. Perihilar airspace filling is noted bilaterally, right greater than left. Findings are consistent with pulmonary edema. There are no focal consolidations. Suspect small bilateral pleural effusions. There has been further compression of T12 since prior studies.  IMPRESSION: 1. Cardiomegaly and pulmonary edema. 2. Further compression  of T12.   Electronically Signed   By: Rosalie Gums M.D.   On: 02/13/2014 18:28   Ct Head Wo Contrast  02/13/2014   CLINICAL DATA:  Hit head after fall. Recent right orbital floor fracture.  EXAM: CT HEAD WITHOUT CONTRAST  TECHNIQUE: Contiguous axial images were obtained from the base of the skull through the vertex without contrast.  COMPARISON:  Head CT 02/12/2014  FINDINGS: Stable cerebral atrophy. Diffuse low-density in the periventricular and subcortical white matter suggests chronic changes. Again noted are left basal ganglia calcifications. No evidence for acute hemorrhage, mass lesion, midline shift, hydrocephalus or large infarct. Again noted is soft tissue swelling around the right orbit. Changes consistent with a right  orbital floor fracture. There is increased soft tissue swelling around the right orbit compared to the previous examination. There may be increased blood products in right maxillary sinus related to the orbital floor fracture.  IMPRESSION: No acute intracranial abnormality.  Atrophy and evidence for chronic small vessel ischemic changes.  Changes consistent with a right orbital floor fracture. Increased soft tissue swelling around the right orbit. Increased blood products in the right maxillary sinus region.   Electronically Signed   By: Richarda Overlie M.D.   On: 02/13/2014 17:55   Ct Head Wo Contrast  02/12/2014   CLINICAL DATA:  80 year old female with fall with head, face and neck injury and pain. Right high/orbit bruising.  EXAM: CT HEAD WITHOUT CONTRAST  CT MAXILLOFACIAL WITHOUT CONTRAST  CT CERVICAL SPINE WITHOUT CONTRAST  TECHNIQUE: Multidetector CT imaging of the head, cervical spine, and maxillofacial structures were performed using the standard protocol without intravenous contrast. Multiplanar CT image reconstructions of the cervical spine and maxillofacial structures were also generated.  COMPARISON:  08/20/2013 and prior CTs  FINDINGS: CT HEAD FINDINGS  Mild to moderate atrophy and chronic small-vessel white matter ischemic changes again noted. A remote right cerebellar infarct is noted.  No acute intracranial abnormalities are identified, including mass lesion or mass effect, hydrocephalus, extra-axial fluid collection, midline shift, hemorrhage, or acute infarction.  CT MAXILLOFACIAL FINDINGS  A right orbital floor fracture is identified with up to 8 mm displacement laterally.  Herniation of fat through the fracture site is noted without definite extraocular muscle herniation but correlate clinically. Soft tissue swelling overlying the right face and orbit noted.  The globes retain their spherical shape but there is proptosis on the right noted.  No other fracture, subluxation or dislocation  identified.  The remainder the paranasal sinuses, mastoid air cells and middle/ inner ears are clear.  No focal bony lesions are identified.  CT CERVICAL SPINE FINDINGS  Moderate apex left cervical scoliosis is again noted.  There is no evidence of acute fracture, subluxation or prevertebral soft swelling.  Mild to moderate multilevel degenerative disc disease, spondylosis and facet arthropathy again noted.  No focal bony lesions are present.  The soft tissue structures are unremarkable.  IMPRESSION: Right orbital floor fracture displaced up to 8 mm laterally. Herniation of fat without definite extraocular muscle herniation through the fracture site but correlate clinically.  No evidence of acute intracranial abnormality.  No static evidence of acute injury to the cervical spine.  Mild to moderate cerebral atrophy, chronic small-vessel white matter ischemic changes, remote right cerebellar infarct, and mild to moderate multilevel cervical spine degenerative changes.   Electronically Signed   By: Laveda Abbe M.D.   On: 02/12/2014 15:05   Ct Cervical Spine Wo Contrast  02/12/2014   CLINICAL DATA:  80 year old female with fall with head, face and neck injury and pain. Right high/orbit bruising.  EXAM: CT HEAD WITHOUT CONTRAST  CT MAXILLOFACIAL WITHOUT CONTRAST  CT CERVICAL SPINE WITHOUT CONTRAST  TECHNIQUE: Multidetector CT imaging of the head, cervical spine, and maxillofacial structures were performed using the standard protocol without intravenous contrast. Multiplanar CT image reconstructions of the cervical spine and maxillofacial structures were also generated.  COMPARISON:  08/20/2013 and prior CTs  FINDINGS: CT HEAD FINDINGS  Mild to moderate atrophy and chronic small-vessel white matter ischemic changes again noted. A remote right cerebellar infarct is noted.  No acute intracranial abnormalities are identified, including mass lesion or mass effect, hydrocephalus, extra-axial fluid collection, midline shift,  hemorrhage, or acute infarction.  CT MAXILLOFACIAL FINDINGS  A right orbital floor fracture is identified with up to 8 mm displacement laterally.  Herniation of fat through the fracture site is noted without definite extraocular muscle herniation but correlate clinically. Soft tissue swelling overlying the right face and orbit noted.  The globes retain their spherical shape but there is proptosis on the right noted.  No other fracture, subluxation or dislocation identified.  The remainder the paranasal sinuses, mastoid air cells and middle/ inner ears are clear.  No focal bony lesions are identified.  CT CERVICAL SPINE FINDINGS  Moderate apex left cervical scoliosis is again noted.  There is no evidence of acute fracture, subluxation or prevertebral soft swelling.  Mild to moderate multilevel degenerative disc disease, spondylosis and facet arthropathy again noted.  No focal bony lesions are present.  The soft tissue structures are unremarkable.  IMPRESSION: Right orbital floor fracture displaced up to 8 mm laterally. Herniation of fat without definite extraocular muscle herniation through the fracture site but correlate clinically.  No evidence of acute intracranial abnormality.  No static evidence of acute injury to the cervical spine.  Mild to moderate cerebral atrophy, chronic small-vessel white matter ischemic changes, remote right cerebellar infarct, and mild to moderate multilevel cervical spine degenerative changes.   Electronically Signed   By: Laveda Abbe M.D.   On: 02/12/2014 15:05   Ct Maxillofacial Wo Cm  02/12/2014   CLINICAL DATA:  80 year old female with fall with head, face and neck injury and pain. Right high/orbit bruising.  EXAM: CT HEAD WITHOUT CONTRAST  CT MAXILLOFACIAL WITHOUT CONTRAST  CT CERVICAL SPINE WITHOUT CONTRAST  TECHNIQUE: Multidetector CT imaging of the head, cervical spine, and maxillofacial structures were performed using the standard protocol without intravenous contrast.  Multiplanar CT image reconstructions of the cervical spine and maxillofacial structures were also generated.  COMPARISON:  08/20/2013 and prior CTs  FINDINGS: CT HEAD FINDINGS  Mild to moderate atrophy and chronic small-vessel white matter ischemic changes again noted. A remote right cerebellar infarct is noted.  No acute intracranial abnormalities are identified, including mass lesion or mass effect, hydrocephalus, extra-axial fluid collection, midline shift, hemorrhage, or acute infarction.  CT MAXILLOFACIAL FINDINGS  A right orbital floor fracture is identified with up to 8 mm displacement laterally.  Herniation of fat through the fracture site is noted without definite extraocular muscle herniation but correlate clinically. Soft tissue swelling overlying the right face and orbit noted.  The globes retain their spherical shape but there is proptosis on the right noted.  No other fracture, subluxation or dislocation identified.  The remainder the paranasal sinuses, mastoid air cells and middle/ inner ears are clear.  No focal bony lesions are identified.  CT CERVICAL SPINE FINDINGS  Moderate apex left cervical scoliosis is  again noted.  There is no evidence of acute fracture, subluxation or prevertebral soft swelling.  Mild to moderate multilevel degenerative disc disease, spondylosis and facet arthropathy again noted.  No focal bony lesions are present.  The soft tissue structures are unremarkable.  IMPRESSION: Right orbital floor fracture displaced up to 8 mm laterally. Herniation of fat without definite extraocular muscle herniation through the fracture site but correlate clinically.  No evidence of acute intracranial abnormality.  No static evidence of acute injury to the cervical spine.  Mild to moderate cerebral atrophy, chronic small-vessel white matter ischemic changes, remote right cerebellar infarct, and mild to moderate multilevel cervical spine degenerative changes.   Electronically Signed   By: Laveda Abbe M.D.   On: 02/12/2014 15:05   IMPRESSIONS:  1. 2 recent falls that may have been syncope. Both were unwitnessed and it is hard to tell whether this was loss of balance with a subsequent fall or whether she had a blood pressure or rhythm event prior to the fall.  2. Hypertrophic cardiomyopathy  3. Atrial fibrillation with controlled response  4. Type B aortic dissection not felt to be a candidate for repair  5. 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 Hypertensive heart disease - BP controlled 8. Significant pulmonary hypertension  9. Acute on chronic diastolic heart failure which is likely worse due 2 atrial fibrillation in the presence of mitral stenosis as well as hypertrophic cardiomyopathy - she is 1.6L net neg and she is 3lbs down from admit (now 152lbs).  Her normal weight at home is 150lbs so she is near her dry weight.  Her lungs are clear on exam and she has no LE edema. 10. COPD per PCP RECOMMENDATION:  1. Continue low dose amio/BB 2. Not candidate for anticoagulation 3. Change back to home dose of Lasix PO  Will sign off call with any questions  Quintella Reichert, MD  02/15/2014  9:32 AM

## 2014-02-15 NOTE — Progress Notes (Signed)
CSW (Clinical Child psychotherapist) received call from facility informing that [facility] was reviewed and pt can return. CSW notified pt family and provided with discharge packet. CSW signing off.  Breklyn Fabrizio, LCSWA [phone]

## 2014-02-15 NOTE — Discharge Summary (Addendum)
Physician Discharge Summary  Patricia [name] [facility] ZOX:[id number] DOB: [birth year] DOA: 02/13/2014  PCP: Londell Moh, MD  Admit date: 02/13/2014 Discharge date: 02/15/2014  Time spent: 45 minutes  Recommendations for Outpatient Follow-up:  1. PCP in 1 week, re-assess volume status may need more Po lasix 2. Dr.Shoemaker as referred by EDP after fall/R orbital floor fracture later this week 3. Continue FU with [facility]   Discharge Diagnoses:  Principal Problem:   Syncope and collapse   R orbital floor fracture   Hypertensive heart disease   Hypertrophic obstructive cardiomyopathy(425.11)   Chronic atrial fibrillation   History of mitral valve replacement with bioprosthetic valve   Pulmonary hypertension, secondary   Chronic diastolic heart failure   Descending thoracic dissection   At high risk for falls   DNR (do not resuscitate)   Syncope   Head injuries   Acute on chronic diastolic congestive heart failure   Atrial fibrillation with RVR   Mitral stenosis   Discharge Condition: stable  Diet recommendation: low sodium  Filed Weights   02/13/14 2023 02/14/14 0635 02/15/14 0404  Weight: 70.4 kg (155 lb 3.3 oz) 70.262 kg (154 lb 14.4 oz) 69 kg (152 lb 1.9 oz)    History of present illness:  Patricia [name] is a 80 y.o. female with a history of HOCM, Chronic Afib, mitral stenosis s/p MVR, mild CAD, secondary pulm HTN, chronic resp failure on 2.5L home O2, Mild COPD and Type B aortic dissection ( managed non operatively), also followed by Hospice currently a resident of ALF was seen in the ER 9/25 after what seems like a syncopal episode, she was found to have significant bruising and echymosis around R eye and an orbital fracture, she was monitored in ER and discharged back after local wound care and Dermabond Rx of lacerations to FU with OMFS next week.  On 9/26 while getting out of the shower she passed out and was found down this time hitting the middle of her  head.  She was sent to the ER again for eval and Seen by Cardiology who recommended admission per [facility] for observation on tele etc.   Hospital Course:  Syncope and collapse  -suspect arrhythmia/Afib related  -Tele with PVCs, afib, Orthostatics negative  -Troponin x2 negative  -repeat CT head stable   Acute on chronic Diastolic CHF/ Hypertrophic obstructive cardiomyopathy  -last [facility] 2/15 with normal EF, mild AR, mild MR and moderate TR, dilated Rv and severe pulm HTN  -BNP >6K and CXr with some pulm edema  -diuresed with low dose IV lasix, diuresed >2L negative and weight down 3lbs  -Ok for DC on home dose of 20mg  PO lasix  -educated abt diet   Chronic atrial fibrillation  -rate controlled  -continue amiodarone and PO metoprolol  -not on anticoagulation due to dissection   Pulmonary hypertension, secondary/chronic resp failure  -on 2.5L home O2   Descending thoracic dissection  -being managed non operatively  -followed by Hospice   R orbital floor fracture and peri-orbital laceration  -noted after syncope 9/25  -vision and ocular movts intact  -lacerations approximated with dermabond per EDP  -FU with Dr.Shoemaker later this week, she was given a referral to [facility] by ED physician after fall/fracture   Hypothyroidism  -continue synthroid    Consultations:  Cards  Discharge Exam: Filed Vitals:   02/15/14 1011  BP: 117/57  Pulse: 64  Temp:   Resp:    General: AAOx3  Cardiovascular: S1S2/Irregular rate and rhythm  Respiratory:CTAB   Discharge Instructions You were cared for by a hospitalist during your hospital stay. If you have any questions about your discharge medications or the care you received while you were in the hospital after you are discharged, you can call the unit and asked to speak with the hospitalist on call if the hospitalist that took care of you is not available. Once you are discharged, your primary care physician will handle any further medical  issues. Please note that NO REFILLS for any discharge medications will be authorized once you are discharged, as it is imperative that you return to your primary care physician (or establish a relationship with a primary care physician if you do not have one) for your aftercare needs so that they can reassess your need for medications and monitor your lab values.  Discharge Instructions   Diet - low sodium heart healthy    Complete by:  As directed      Increase activity slowly    Complete by:  As directed           Current Discharge Medication List    CONTINUE these medications which have NOT CHANGED   Details  amiodarone (PACERONE) 100 MG tablet Take 100 mg by mouth daily.    amoxicillin (AMOXIL) 500 MG capsule Take 2,000 mg by mouth once. One hour prior to dental appointment    calcitonin, salmon, (MIACALCIN/FORTICAL) 200 UNIT/ACT nasal spray Place 1 spray into alternate nostrils daily.    Calcium Carbonate-Vitamin D (CALCIUM 500 + D PO) Take 1 tablet by mouth 2 (two) times daily.     cholecalciferol (VITAMIN D) 1000 UNITS tablet Take 1,000 Units by mouth at bedtime.     ferrous sulfate 325 (65 FE) MG tablet Take 325 mg by mouth 2 (two) times a week. Tuesdays and Thursdays    furosemide (LASIX) 20 MG tablet Take 1 tablet (20 mg total) by mouth daily. Qty: 30 tablet, Refills: 6    levothyroxine (SYNTHROID, LEVOTHROID) 125 MCG tablet Take 62.5 mcg by mouth daily before breakfast.    metoprolol tartrate (LOPRESSOR) 25 MG tablet Take 1 tablet (25 mg total) by mouth 2 (two) times daily. Qty: 60 tablet, Refills: 12    potassium chloride SA (K-DUR,KLOR-CON) 20 MEQ tablet Take 20 mEq by mouth 2 (two) times daily.    Probiotic Product (PROBIOTIC DAILY PO) Take 1 tablet by mouth daily.    sertraline (ZOLOFT) 100 MG tablet Take 100 mg by mouth daily.   Associated Diagnoses: Anemia; Atrial fibrillation; SOB (shortness of breath)    nitroGLYCERIN (NITROSTAT) 0.4 MG SL tablet Place 0.4  mg under the tongue every 5 (five) minutes as needed for chest pain.       Allergies  Allergen Reactions  . Ivp Dye [Iodinated Diagnostic Agents] Itching  . Nitrofurantoin Itching  . Ramipril Other (See Comments)    unknown   Follow-up Information   Follow up with Londell Moh, MD. Schedule an appointment as soon as possible for a visit in 1 week.   Specialty:  Internal Medicine   Contact information:   [address] [place] [address] [place] [address] [phone]       Follow up with SHOEMAKER, DAVID, MD In 5 days.   Specialty:  Otolaryngology   Contact information:   [address] [place] [address] [phone]        The results of significant diagnostics from this hospitalization (including imaging, microbiology, ancillary and laboratory) are listed below for  reference.    Significant Diagnostic Studies: Dg Chest 2 View  02/13/2014   CLINICAL DATA:  Fall yesterday. Hit her head. History of CHF, valve replacement. Oxygen requirement.  EXAM: CHEST  2 VIEW  COMPARISON:  09/06/2013, 08/20/2013  FINDINGS: The heart is enlarged. Patient has had median sternotomy and valve replacement. There is prominence of the pulmonary arteries segments. Perihilar airspace filling is noted bilaterally, right greater than left. Findings are consistent with pulmonary edema. There are no focal consolidations. Suspect small bilateral pleural effusions. There has been further compression of T12 since prior studies.  IMPRESSION: 1. Cardiomegaly and pulmonary edema. 2. Further compression of T12.   Electronically Signed   By: Rosalie Gums M.D.   On: 02/13/2014 18:28   Ct Head Wo Contrast  02/13/2014   CLINICAL DATA:  Hit head after fall. Recent right orbital floor fracture.  EXAM: CT HEAD WITHOUT CONTRAST  TECHNIQUE: Contiguous axial images were obtained from the base of the skull through the vertex without contrast.  COMPARISON:  Head CT 02/12/2014  FINDINGS: Stable  cerebral atrophy. Diffuse low-density in the periventricular and subcortical white matter suggests chronic changes. Again noted are left basal ganglia calcifications. No evidence for acute hemorrhage, mass lesion, midline shift, hydrocephalus or large infarct. Again noted is soft tissue swelling around the right orbit. Changes consistent with a right orbital floor fracture. There is increased soft tissue swelling around the right orbit compared to the previous examination. There may be increased blood products in right maxillary sinus related to the orbital floor fracture.  IMPRESSION: No acute intracranial abnormality.  Atrophy and evidence for chronic small vessel ischemic changes.  Changes consistent with a right orbital floor fracture. Increased soft tissue swelling around the right orbit. Increased blood products in the right maxillary sinus region.   Electronically Signed   By: Richarda Overlie M.D.   On: 02/13/2014 17:55   Ct Head Wo Contrast  02/12/2014   CLINICAL DATA:  80 year old female with fall with head, face and neck injury and pain. Right high/orbit bruising.  EXAM: CT HEAD WITHOUT CONTRAST  CT MAXILLOFACIAL WITHOUT CONTRAST  CT CERVICAL SPINE WITHOUT CONTRAST  TECHNIQUE: Multidetector CT imaging of the head, cervical spine, and maxillofacial structures were performed using the standard protocol without intravenous contrast. Multiplanar CT image reconstructions of the cervical spine and maxillofacial structures were also generated.  COMPARISON:  08/20/2013 and prior CTs  FINDINGS: CT HEAD FINDINGS  Mild to moderate atrophy and chronic small-vessel white matter ischemic changes again noted. A remote right cerebellar infarct is noted.  No acute intracranial abnormalities are identified, including mass lesion or mass effect, hydrocephalus, extra-axial fluid collection, midline shift, hemorrhage, or acute infarction.  CT MAXILLOFACIAL FINDINGS  A right orbital floor fracture is identified with up to 8 mm  displacement laterally.  Herniation of fat through the fracture site is noted without definite extraocular muscle herniation but correlate clinically. Soft tissue swelling overlying the right face and orbit noted.  The globes retain their spherical shape but there is proptosis on the right noted.  No other fracture, subluxation or dislocation identified.  The remainder the paranasal sinuses, mastoid air cells and middle/ inner ears are clear.  No focal bony lesions are identified.  CT CERVICAL SPINE FINDINGS  Moderate apex left cervical scoliosis is again noted.  There is no evidence of acute fracture, subluxation or prevertebral soft swelling.  Mild to moderate multilevel degenerative disc disease, spondylosis and facet arthropathy again noted.  No focal bony lesions  are present.  The soft tissue structures are unremarkable.  IMPRESSION: Right orbital floor fracture displaced up to 8 mm laterally. Herniation of fat without definite extraocular muscle herniation through the fracture site but correlate clinically.  No evidence of acute intracranial abnormality.  No static evidence of acute injury to the cervical spine.  Mild to moderate cerebral atrophy, chronic small-vessel white matter ischemic changes, remote right cerebellar infarct, and mild to moderate multilevel cervical spine degenerative changes.   Electronically Signed   By: Laveda Abbe M.D.   On: 02/12/2014 15:05   Ct Cervical Spine Wo Contrast  02/12/2014   CLINICAL DATA:  80 year old female with fall with head, face and neck injury and pain. Right high/orbit bruising.  EXAM: CT HEAD WITHOUT CONTRAST  CT MAXILLOFACIAL WITHOUT CONTRAST  CT CERVICAL SPINE WITHOUT CONTRAST  TECHNIQUE: Multidetector CT imaging of the head, cervical spine, and maxillofacial structures were performed using the standard protocol without intravenous contrast. Multiplanar CT image reconstructions of the cervical spine and maxillofacial structures were also generated.  COMPARISON:   08/20/2013 and prior CTs  FINDINGS: CT HEAD FINDINGS  Mild to moderate atrophy and chronic small-vessel white matter ischemic changes again noted. A remote right cerebellar infarct is noted.  No acute intracranial abnormalities are identified, including mass lesion or mass effect, hydrocephalus, extra-axial fluid collection, midline shift, hemorrhage, or acute infarction.  CT MAXILLOFACIAL FINDINGS  A right orbital floor fracture is identified with up to 8 mm displacement laterally.  Herniation of fat through the fracture site is noted without definite extraocular muscle herniation but correlate clinically. Soft tissue swelling overlying the right face and orbit noted.  The globes retain their spherical shape but there is proptosis on the right noted.  No other fracture, subluxation or dislocation identified.  The remainder the paranasal sinuses, mastoid air cells and middle/ inner ears are clear.  No focal bony lesions are identified.  CT CERVICAL SPINE FINDINGS  Moderate apex left cervical scoliosis is again noted.  There is no evidence of acute fracture, subluxation or prevertebral soft swelling.  Mild to moderate multilevel degenerative disc disease, spondylosis and facet arthropathy again noted.  No focal bony lesions are present.  The soft tissue structures are unremarkable.  IMPRESSION: Right orbital floor fracture displaced up to 8 mm laterally. Herniation of fat without definite extraocular muscle herniation through the fracture site but correlate clinically.  No evidence of acute intracranial abnormality.  No static evidence of acute injury to the cervical spine.  Mild to moderate cerebral atrophy, chronic small-vessel white matter ischemic changes, remote right cerebellar infarct, and mild to moderate multilevel cervical spine degenerative changes.   Electronically Signed   By: Laveda Abbe M.D.   On: 02/12/2014 15:05   Ct Maxillofacial Wo Cm  02/12/2014   CLINICAL DATA:  80 year old female with fall with  head, face and neck injury and pain. Right high/orbit bruising.  EXAM: CT HEAD WITHOUT CONTRAST  CT MAXILLOFACIAL WITHOUT CONTRAST  CT CERVICAL SPINE WITHOUT CONTRAST  TECHNIQUE: Multidetector CT imaging of the head, cervical spine, and maxillofacial structures were performed using the standard protocol without intravenous contrast. Multiplanar CT image reconstructions of the cervical spine and maxillofacial structures were also generated.  COMPARISON:  08/20/2013 and prior CTs  FINDINGS: CT HEAD FINDINGS  Mild to moderate atrophy and chronic small-vessel white matter ischemic changes again noted. A remote right cerebellar infarct is noted.  No acute intracranial abnormalities are identified, including mass lesion or mass effect, hydrocephalus, extra-axial fluid collection, midline  shift, hemorrhage, or acute infarction.  CT MAXILLOFACIAL FINDINGS  A right orbital floor fracture is identified with up to 8 mm displacement laterally.  Herniation of fat through the fracture site is noted without definite extraocular muscle herniation but correlate clinically. Soft tissue swelling overlying the right face and orbit noted.  The globes retain their spherical shape but there is proptosis on the right noted.  No other fracture, subluxation or dislocation identified.  The remainder the paranasal sinuses, mastoid air cells and middle/ inner ears are clear.  No focal bony lesions are identified.  CT CERVICAL SPINE FINDINGS  Moderate apex left cervical scoliosis is again noted.  There is no evidence of acute fracture, subluxation or prevertebral soft swelling.  Mild to moderate multilevel degenerative disc disease, spondylosis and facet arthropathy again noted.  No focal bony lesions are present.  The soft tissue structures are unremarkable.  IMPRESSION: Right orbital floor fracture displaced up to 8 mm laterally. Herniation of fat without definite extraocular muscle herniation through the fracture site but correlate  clinically.  No evidence of acute intracranial abnormality.  No static evidence of acute injury to the cervical spine.  Mild to moderate cerebral atrophy, chronic small-vessel white matter ischemic changes, remote right cerebellar infarct, and mild to moderate multilevel cervical spine degenerative changes.   Electronically Signed   By: Laveda Abbe M.D.   On: 02/12/2014 15:05    Microbiology: Recent Results (from the past 240 hour(s))  URINE CULTURE     Status: None   Collection Time    02/13/14  3:33 PM      Result Value Ref Range Status   Specimen Description URINE, CLEAN CATCH   Final   Special Requests NONE   Final   Culture  Setup Time     Final   Value: 02/13/2014 16:26     Performed at [facility] Count     Final   Value: 9,000 COLONIES/ML     Performed at [facility]   Culture     Final   Value: INSIGNIFICANT GROWTH     Performed at [facility]   Report Status 02/15/2014 FINAL   Final     Labs: Basic Metabolic Panel:  Recent Labs Lab 02/12/14 1337 02/13/14 1544 02/14/14 0446 02/15/14 0036  NA 141 141 139 140  K 4.2 4.6 4.6 3.7  CL 103 101 100 100  CO2 GLUCOSE 101* 96 81 99  BUN CREATININE 0.91 1.05 0.92 0.89  CALCIUM 8.9 9.2 8.7 8.3*   Liver Function Tests:  Recent Labs Lab 02/14/14 0446  AST 25  ALT 19  ALKPHOS 78  BILITOT 0.8  PROT 6.3  ALBUMIN 2.9*   No results found for this basename: LIPASE, AMYLASE,  in the last 168 hours No results found for this basename: AMMONIA,  in the last 168 hours CBC:  Recent Labs Lab 02/12/14 1337 02/13/14 1544 02/14/14 0446  WBC 10.1 9.0 8.1  NEUTROABS  --  7.2  --   HGB 11.8* 11.3* 10.5*  HCT 38.1 37.0 35.0*  MCV 90.5 89.4 89.7  PLT 104* 107* 94*   Cardiac Enzymes:  Recent Labs Lab 02/13/14 2045 02/14/14 0117  TROPONINI <0.30 <0.30   BNP: BNP (last 3 results)  Recent Labs  09/06/13 0211 09/24/13 1459 02/13/14 2045  PROBNP 6779.0*  1238.0* 6333.0*   CBG: No results found for this basename: GLUCAP,  in the  last 168 hours     Signed:  Yolandra Habig  [facility] 02/15/2014, 12:59 PM

## 2014-02-15 NOTE — Telephone Encounter (Signed)
Order placed to [facility].  

## 2014-02-15 NOTE — Assessment & Plan Note (Signed)
Stop CPAP for now. We will use [facility] oxygen at 4L/min at night Follow with Dr Ross 4/24 as planned Follow with Dr Byrum in 4-6 weeks  

## 2014-02-15 NOTE — Progress Notes (Deleted)
Physician Discharge Summary  Chastidy Ranker [facility] ZOX:[id number] DOB: 12-17-1926 DOA: 02/13/2014  PCP: Londell Moh, MD  Admit date: 02/13/2014 Discharge date: 02/15/2014  Time spent: 45 minutes  Recommendations for Outpatient Follow-up:  1. PCP in 1 week, re-assess volume status may need more Po lasix 2. Dr.Shoemaker as referred by EDP after fall/fracture later this week  Discharge Diagnoses:  Principal Problem:   Syncope and collapse   R orbital floor fracture   Hypertensive heart disease   Hypertrophic obstructive cardiomyopathy(425.11)   Chronic atrial fibrillation   History of mitral valve replacement with bioprosthetic valve   Pulmonary hypertension, secondary   Chronic diastolic heart failure   Descending thoracic dissection   At high risk for falls   DNR (do not resuscitate)   Syncope   Head injuries   Acute on chronic diastolic congestive heart failure   Atrial fibrillation with RVR   Mitral stenosis   Discharge Condition: stable  Diet recommendation: low sodium  Filed Weights   02/13/14 2023 02/14/14 0635 02/15/14 0404  Weight: 70.4 kg (155 lb 3.3 oz) 70.262 kg (154 lb 14.4 oz) 69 kg (152 lb 1.9 oz)    History of present illness:  Patricia [name] is a 80 y.o. female with a history of HOCM, Chronic Afib, mitral stenosis s/p MVR, mild CAD, secondary pulm HTN, chronic resp failure on 2.5L home O2, Mild COPD and Type B aortic dissection ( managed non operatively), also followed by Hospice currently a resident of ALF was seen in the ER 9/25 after what seems like a syncopal episode, she was found to have significant bruising and echymosis around R eye and an orbital fracture, she was monitored in ER and discharged back after local wound care and Dermabond Rx of lacerations to FU with OMFS next week.  On 9/26 while getting out of the shower she passed out and was found down this time hitting the middle of her head.  She was sent to the ER again for eval and Seen by  Cardiology who recommended admission per [facility] for observation on tele etc.   Hospital Course:  Syncope and collapse  -suspect arrhythmia/Afib related -Tele with PVCs, afib, Orthostatics negative  -Troponin x2 negative  -repeat CT head stable   Acute on chronic Diastolic CHF/ Hypertrophic obstructive cardiomyopathy  -last ECHo 2/15 with normal EF, mild AR, mild MR and moderate TR, dilated Rv and severe pulm HTN  -BNP >6K and CXr with some pulm edema  -diuresed with low dose IV lasix, diuresed >2L negative and weight down 3lbs  -Ok for DC on home dose of 20mg  PO lasix -educated abt diet  Chronic atrial fibrillation  -rate controlled  -continue amiodarone and PO metoprolol  -not on anticoagulation due to dissection   Pulmonary hypertension, secondary/chronic resp failure  -on 2.5L home O2   Descending thoracic dissection  -being managed non operatively  -followed by Hospice   R orbital floor fracture and peri-orbital laceration  -noted after syncope 9/25  -vision and ocular movts intact  -lacerations approximated with dermabond per EDP  -FU with Dr.Shoemaker later this week, she was given a referral to [facility] by ED physician after fall/fracture  Hypothyroidism  -continue synthroid      Consultations:  Cards  Discharge Exam: Filed Vitals:   02/15/14 1011  BP: 117/57  Pulse: 64  Temp:   Resp:     General: AAOx3 Cardiovascular: S1S2/Irregular rate and rhythm Respiratory:CTAB  Discharge Instructions You were cared for by a hospitalist during your  hospital stay. If you have any questions about your discharge medications or the care you received while you were in the hospital after you are discharged, you can call the unit and asked to speak with the hospitalist on call if the hospitalist that took care of you is not available. Once you are discharged, your primary care physician will handle any further medical issues. Please note that NO REFILLS for any discharge  medications will be authorized once you are discharged, as it is imperative that you return to your primary care physician (or establish a relationship with a primary care physician if you do not have one) for your aftercare needs so that they can reassess your need for medications and monitor your lab values.  Discharge Instructions   Diet - low sodium heart healthy    Complete by:  As directed      Increase activity slowly    Complete by:  As directed           Current Discharge Medication List    CONTINUE these medications which have NOT CHANGED   Details  amiodarone (PACERONE) 100 MG tablet Take 100 mg by mouth daily.    amoxicillin (AMOXIL) 500 MG capsule Take 2,000 mg by mouth once. One hour prior to dental appointment    calcitonin, salmon, (MIACALCIN/FORTICAL) 200 UNIT/ACT nasal spray Place 1 spray into alternate nostrils daily.    Calcium Carbonate-Vitamin D (CALCIUM 500 + D PO) Take 1 tablet by mouth 2 (two) times daily.     cholecalciferol (VITAMIN D) 1000 UNITS tablet Take 1,000 Units by mouth at bedtime.     ferrous sulfate 325 (65 FE) MG tablet Take 325 mg by mouth 2 (two) times a week. Tuesdays and Thursdays    furosemide (LASIX) 20 MG tablet Take 1 tablet (20 mg total) by mouth daily. Qty: 30 tablet, Refills: 6    levothyroxine (SYNTHROID, LEVOTHROID) 125 MCG tablet Take 62.5 mcg by mouth daily before breakfast.    metoprolol tartrate (LOPRESSOR) 25 MG tablet Take 1 tablet (25 mg total) by mouth 2 (two) times daily. Qty: 60 tablet, Refills: 12    potassium chloride SA (K-DUR,KLOR-CON) 20 MEQ tablet Take 20 mEq by mouth 2 (two) times daily.    Probiotic Product (PROBIOTIC DAILY PO) Take 1 tablet by mouth daily.    sertraline (ZOLOFT) 100 MG tablet Take 100 mg by mouth daily.   Associated Diagnoses: Anemia; Atrial fibrillation; SOB (shortness of breath)    nitroGLYCERIN (NITROSTAT) 0.4 MG SL tablet Place 0.4 mg under the tongue every 5 (five) minutes as needed  for chest pain.       Allergies  Allergen Reactions  . Ivp Dye [Iodinated Diagnostic Agents] Itching  . Nitrofurantoin Itching  . Ramipril Other (See Comments)    unknown   Follow-up Information   Follow up with Londell Moh, MD. Schedule an appointment as soon as possible for a visit in 1 week.   Specialty:  Internal Medicine   Contact information:   [address] [place] [address] [place] [address] [phone]        The results of significant diagnostics from this hospitalization (including imaging, microbiology, ancillary and laboratory) are listed below for reference.    Significant Diagnostic Studies: Dg Chest 2 View  02/13/2014   CLINICAL DATA:  Fall yesterday. Hit her head. History of CHF, valve replacement. Oxygen requirement.  EXAM: CHEST  2 VIEW  COMPARISON:  09/06/2013, 08/20/2013  FINDINGS: The heart is enlarged. Patient has had  median sternotomy and valve replacement. There is prominence of the pulmonary arteries segments. Perihilar airspace filling is noted bilaterally, right greater than left. Findings are consistent with pulmonary edema. There are no focal consolidations. Suspect small bilateral pleural effusions. There has been further compression of T12 since prior studies.  IMPRESSION: 1. Cardiomegaly and pulmonary edema. 2. Further compression of T12.   Electronically Signed   By: Rosalie Gums M.D.   On: 02/13/2014 18:28   Ct Head Wo Contrast  02/13/2014   CLINICAL DATA:  Hit head after fall. Recent right orbital floor fracture.  EXAM: CT HEAD WITHOUT CONTRAST  TECHNIQUE: Contiguous axial images were obtained from the base of the skull through the vertex without contrast.  COMPARISON:  Head CT 02/12/2014  FINDINGS: Stable cerebral atrophy. Diffuse low-density in the periventricular and subcortical white matter suggests chronic changes. Again noted are left basal ganglia calcifications. No evidence for acute hemorrhage, mass lesion, midline shift,  hydrocephalus or large infarct. Again noted is soft tissue swelling around the right orbit. Changes consistent with a right orbital floor fracture. There is increased soft tissue swelling around the right orbit compared to the previous examination. There may be increased blood products in right maxillary sinus related to the orbital floor fracture.  IMPRESSION: No acute intracranial abnormality.  Atrophy and evidence for chronic small vessel ischemic changes.  Changes consistent with a right orbital floor fracture. Increased soft tissue swelling around the right orbit. Increased blood products in the right maxillary sinus region.   Electronically Signed   By: Richarda Overlie M.D.   On: 02/13/2014 17:55   Ct Head Wo Contrast  02/12/2014   CLINICAL DATA:  80 year old female with fall with head, face and neck injury and pain. Right high/orbit bruising.  EXAM: CT HEAD WITHOUT CONTRAST  CT MAXILLOFACIAL WITHOUT CONTRAST  CT CERVICAL SPINE WITHOUT CONTRAST  TECHNIQUE: Multidetector CT imaging of the head, cervical spine, and maxillofacial structures were performed using the standard protocol without intravenous contrast. Multiplanar CT image reconstructions of the cervical spine and maxillofacial structures were also generated.  COMPARISON:  08/20/2013 and prior CTs  FINDINGS: CT HEAD FINDINGS  Mild to moderate atrophy and chronic small-vessel white matter ischemic changes again noted. A remote right cerebellar infarct is noted.  No acute intracranial abnormalities are identified, including mass lesion or mass effect, hydrocephalus, extra-axial fluid collection, midline shift, hemorrhage, or acute infarction.  CT MAXILLOFACIAL FINDINGS  A right orbital floor fracture is identified with up to 8 mm displacement laterally.  Herniation of fat through the fracture site is noted without definite extraocular muscle herniation but correlate clinically. Soft tissue swelling overlying the right face and orbit noted.  The globes  retain their spherical shape but there is proptosis on the right noted.  No other fracture, subluxation or dislocation identified.  The remainder the paranasal sinuses, mastoid air cells and middle/ inner ears are clear.  No focal bony lesions are identified.  CT CERVICAL SPINE FINDINGS  Moderate apex left cervical scoliosis is again noted.  There is no evidence of acute fracture, subluxation or prevertebral soft swelling.  Mild to moderate multilevel degenerative disc disease, spondylosis and facet arthropathy again noted.  No focal bony lesions are present.  The soft tissue structures are unremarkable.  IMPRESSION: Right orbital floor fracture displaced up to 8 mm laterally. Herniation of fat without definite extraocular muscle herniation through the fracture site but correlate clinically.  No evidence of acute intracranial abnormality.  No static evidence of acute injury  to the cervical spine.  Mild to moderate cerebral atrophy, chronic small-vessel white matter ischemic changes, remote right cerebellar infarct, and mild to moderate multilevel cervical spine degenerative changes.   Electronically Signed   By: Laveda Abbe M.D.   On: 02/12/2014 15:05   Ct Cervical Spine Wo Contrast  02/12/2014   CLINICAL DATA:  80 year old female with fall with head, face and neck injury and pain. Right high/orbit bruising.  EXAM: CT HEAD WITHOUT CONTRAST  CT MAXILLOFACIAL WITHOUT CONTRAST  CT CERVICAL SPINE WITHOUT CONTRAST  TECHNIQUE: Multidetector CT imaging of the head, cervical spine, and maxillofacial structures were performed using the standard protocol without intravenous contrast. Multiplanar CT image reconstructions of the cervical spine and maxillofacial structures were also generated.  COMPARISON:  08/20/2013 and prior CTs  FINDINGS: CT HEAD FINDINGS  Mild to moderate atrophy and chronic small-vessel white matter ischemic changes again noted. A remote right cerebellar infarct is noted.  No acute intracranial  abnormalities are identified, including mass lesion or mass effect, hydrocephalus, extra-axial fluid collection, midline shift, hemorrhage, or acute infarction.  CT MAXILLOFACIAL FINDINGS  A right orbital floor fracture is identified with up to 8 mm displacement laterally.  Herniation of fat through the fracture site is noted without definite extraocular muscle herniation but correlate clinically. Soft tissue swelling overlying the right face and orbit noted.  The globes retain their spherical shape but there is proptosis on the right noted.  No other fracture, subluxation or dislocation identified.  The remainder the paranasal sinuses, mastoid air cells and middle/ inner ears are clear.  No focal bony lesions are identified.  CT CERVICAL SPINE FINDINGS  Moderate apex left cervical scoliosis is again noted.  There is no evidence of acute fracture, subluxation or prevertebral soft swelling.  Mild to moderate multilevel degenerative disc disease, spondylosis and facet arthropathy again noted.  No focal bony lesions are present.  The soft tissue structures are unremarkable.  IMPRESSION: Right orbital floor fracture displaced up to 8 mm laterally. Herniation of fat without definite extraocular muscle herniation through the fracture site but correlate clinically.  No evidence of acute intracranial abnormality.  No static evidence of acute injury to the cervical spine.  Mild to moderate cerebral atrophy, chronic small-vessel white matter ischemic changes, remote right cerebellar infarct, and mild to moderate multilevel cervical spine degenerative changes.   Electronically Signed   By: Laveda Abbe M.D.   On: 02/12/2014 15:05   Ct Maxillofacial Wo Cm  02/12/2014   CLINICAL DATA:  80 year old female with fall with head, face and neck injury and pain. Right high/orbit bruising.  EXAM: CT HEAD WITHOUT CONTRAST  CT MAXILLOFACIAL WITHOUT CONTRAST  CT CERVICAL SPINE WITHOUT CONTRAST  TECHNIQUE: Multidetector CT imaging of the  head, cervical spine, and maxillofacial structures were performed using the standard protocol without intravenous contrast. Multiplanar CT image reconstructions of the cervical spine and maxillofacial structures were also generated.  COMPARISON:  08/20/2013 and prior CTs  FINDINGS: CT HEAD FINDINGS  Mild to moderate atrophy and chronic small-vessel white matter ischemic changes again noted. A remote right cerebellar infarct is noted.  No acute intracranial abnormalities are identified, including mass lesion or mass effect, hydrocephalus, extra-axial fluid collection, midline shift, hemorrhage, or acute infarction.  CT MAXILLOFACIAL FINDINGS  A right orbital floor fracture is identified with up to 8 mm displacement laterally.  Herniation of fat through the fracture site is noted without definite extraocular muscle herniation but correlate clinically. Soft tissue swelling overlying the right face and  orbit noted.  The globes retain their spherical shape but there is proptosis on the right noted.  No other fracture, subluxation or dislocation identified.  The remainder the paranasal sinuses, mastoid air cells and middle/ inner ears are clear.  No focal bony lesions are identified.  CT CERVICAL SPINE FINDINGS  Moderate apex left cervical scoliosis is again noted.  There is no evidence of acute fracture, subluxation or prevertebral soft swelling.  Mild to moderate multilevel degenerative disc disease, spondylosis and facet arthropathy again noted.  No focal bony lesions are present.  The soft tissue structures are unremarkable.  IMPRESSION: Right orbital floor fracture displaced up to 8 mm laterally. Herniation of fat without definite extraocular muscle herniation through the fracture site but correlate clinically.  No evidence of acute intracranial abnormality.  No static evidence of acute injury to the cervical spine.  Mild to moderate cerebral atrophy, chronic small-vessel white matter ischemic changes, remote right  cerebellar infarct, and mild to moderate multilevel cervical spine degenerative changes.   Electronically Signed   By: Laveda Abbe M.D.   On: 02/12/2014 15:05    Microbiology: Recent Results (from the past 240 hour(s))  URINE CULTURE     Status: None   Collection Time    02/13/14  3:33 PM      Result Value Ref Range Status   Specimen Description URINE, CLEAN CATCH   Final   Special Requests NONE   Final   Culture  Setup Time     Final   Value: 02/13/2014 16:26     Performed at [facility] Count     Final   Value: 9,000 COLONIES/ML     Performed at [facility]   Culture     Final   Value: INSIGNIFICANT GROWTH     Performed at [facility]   Report Status 02/15/2014 FINAL   Final     Labs: Basic Metabolic Panel:  Recent Labs Lab 02/12/14 1337 02/13/14 1544 02/14/14 0446 02/15/14 0036  NA 141 141 139 140  K 4.2 4.6 4.6 3.7  CL 103 101 100 100  CO2 GLUCOSE 101* 96 81 99  BUN CREATININE 0.91 1.05 0.92 0.89  CALCIUM 8.9 9.2 8.7 8.3*   Liver Function Tests:  Recent Labs Lab 02/14/14 0446  AST 25  ALT 19  ALKPHOS 78  BILITOT 0.8  PROT 6.3  ALBUMIN 2.9*   No results found for this basename: LIPASE, AMYLASE,  in the last 168 hours No results found for this basename: AMMONIA,  in the last 168 hours CBC:  Recent Labs Lab 02/12/14 1337 02/13/14 1544 02/14/14 0446  WBC 10.1 9.0 8.1  NEUTROABS  --  7.2  --   HGB 11.8* 11.3* 10.5*  HCT 38.1 37.0 35.0*  MCV 90.5 89.4 89.7  PLT 104* 107* 94*   Cardiac Enzymes:  Recent Labs Lab 02/13/14 2045 02/14/14 0117  TROPONINI <0.30 <0.30   BNP: BNP (last 3 results)  Recent Labs  09/06/13 0211 09/24/13 1459 02/13/14 2045  PROBNP 6779.0* 1238.0* 6333.0*   CBG: No results found for this basename: GLUCAP,  in the last 168 hours     Signed:  [place]  [facility] 02/15/2014, 10:45 AM

## 2014-02-15 NOTE — Evaluation (Signed)
Physical Therapy Evaluation Patient Details Name: Patricia [name] MRN: [id number] DOB: [birth year] Today's Date: 02/15/2014   History of Present Illness  Patricia [name] is a 80 y.o. female with a history of HOCM, Chronic Afib, mitral stenosis s/p MVR, mild CAD, secondary pulm HTN, chronic resp failure on 2.5L home O2, Mild COPD and Type B aortic dissection ( managed non operatively), also followed by Hospice currently a resident of ALF was seen in the ER 9/25 after what  seems like a syncopal episode, she was found to have significant bruising and echymosis around R eye and an orbital fracture, she was monitored in ER and discharged back after local wound care and Dermabond Rx of lacerations . Pt with second syncopal episode 9/26 with return to ER and admission.   Clinical Impression  Pt very pleasant with swollen right orbit unable to open her eye. Pt with report of at least 6 falls this year with spouse also falling. Pt reportedly impatient with getting assist at ALF and if staff doesn't respond within 10 min pt with tendency to care for herself or mobilize alone. Pt and dgtr educated for need to continue to use call system at ALF but to also keep cell phone in hand for 2 way conversation so she can make staff aware of needs such as when she is having difficulty with her oxygen (which preceded the syncope on 9/25). Pt and dgtr educated for the recommendation of therapy for safety at home as well as vestibular if dizziness returns. Pt currently without symptoms of spinning or dizziness with all head turns and mobility stating. Pt will benefit from acute therapy to maximize mobility, gait, safety and DME use to decrease burden of care and fall risk.     Follow Up Recommendations SNF;Supervision for mobility/OOB;Home health PT (Recommend [facility] for rehab given spouse inability to assist but pt refused. For return to aLF recommend HHPT and if dizziness returns vestibular assessment)    Equipment  Recommendations  None recommended by PT    Recommendations for Other Services       Precautions / Restrictions Precautions Precautions: Fall Precaution Comments: oxygen, unable to open right eye      Mobility  Bed Mobility Overal bed mobility: Modified Independent             General bed mobility comments: increased time with rail   Transfers Overall transfer level: Needs assistance   Transfers: Sit to/from Stand Sit to Stand: Supervision         General transfer comment: cues for hand placement and to control descent as pt "plops" in chair  Ambulation/Gait Ambulation/Gait assistance: Min guard Ambulation Distance (Feet): 90 Feet Assistive device: Rolling walker (2 wheeled) Gait Pattern/deviations: Step-through pattern;Decreased stride length   Gait velocity interpretation: Below normal speed for age/gender General Gait Details: cues for posture, position in RW, looking up and scanning environment due to decreased vision Right eye and running into objects on right x 2  Stairs            Wheelchair Mobility    Modified Rankin (Stroke Patients Only)       Balance                                             Pertinent Vitals/Pain Pain Assessment: No/denies pain    Home Living Family/patient expects to be discharged to::  Assisted living               Home Equipment: Emergency planning/management officer - 4 wheels      Prior Function Level of Independence: Needs assistance   Gait / Transfers Assistance Needed: mod I with RW  ADL's / Homemaking Assistance Needed: assist for bath 3x/wk (supervision) dresses herself, staff does housework  Comments: pt has had at least 6 falls this year, lives in ALF with spouse who has also been falling and uses RW. pt with assist for     Hand Dominance        Extremity/Trunk Assessment   Upper Extremity Assessment: Generalized weakness           Lower Extremity Assessment: Generalized  weakness      Cervical / Trunk Assessment: Normal  Communication   Communication: No difficulties  Cognition Arousal/Alertness: Awake/alert Behavior During Therapy: WFL for tasks assessed/performed Overall Cognitive Status: Impaired/Different from baseline Area of Impairment: Safety/judgement         Safety/Judgement: Decreased awareness of safety;Decreased awareness of deficits     General Comments: pt at baseline per dgtr but can be impulsive due to decreased awareness of her need for assist    General Comments      Exercises        Assessment/Plan    PT Assessment Patient needs continued PT services  PT Diagnosis Difficulty walking   PT Problem List Decreased balance;Decreased activity tolerance;Decreased knowledge of use of DME;Decreased safety awareness  PT Treatment Interventions Gait training;DME instruction;Balance training;Functional mobility training;Patient/family education;Therapeutic activities;Therapeutic exercise   PT Goals (Current goals can be found in the Care Plan section) Acute Rehab PT Goals Patient Stated Goal: not fall PT Goal Formulation: With patient/family Time For Goal Achievement: 03/01/14 Potential to Achieve Goals: Fair    Frequency Min 3X/week   Barriers to discharge Decreased caregiver support      Co-evaluation               End of Session Equipment Utilized During Treatment: Gait belt;Oxygen Activity Tolerance: Patient tolerated treatment well Patient left: in chair;with call bell/phone within reach;with chair alarm set Nurse Communication: Mobility status         Time: 1610-9604 PT Time Calculation (min): 32 min   Charges:   PT Evaluation $Initial PT Evaluation Tier I: 1 Procedure PT Treatments $Gait Training: 8-22 mins $Therapeutic Activity: 8-22 mins   PT G Codes:          Delorse Lek 02/15/2014, 1:04 PM Delaney Meigs, PT [phone]

## 2014-02-15 NOTE — Progress Notes (Signed)
CSW (Clinical Child psychotherapist) faxed completed FL2 with meds to facility. Awaiting confirmation call from facility.  Chari Parmenter, LCSWA [phone]

## 2014-02-15 NOTE — Progress Notes (Addendum)
Clinical Social Work Department BRIEF PSYCHOSOCIAL ASSESSMENT 02/15/2014  Patient:  Patricia [name], Patricia [name]     Account Number:  [id number]     Admit date:  02/13/2014  Clinical Social Worker:  Harless Nakayama  Date/Time:  02/15/2014 12:00 N  Referred by:  Physician  Date Referred:  02/15/2014 Referred for  ALF Placement   Other Referral:   Interview type:  Family Other interview type:   Spoke with pt daughter at bedside    PSYCHOSOCIAL DATA Living Status:  FACILITY Admitted from facility:  [facility] Level of care:  Assisted Living Primary support name:  [name] Primary support relationship to patient:  CHILD, ADULT Degree of support available:   Pt has strong support    CURRENT CONCERNS Current Concerns  Post-Acute Placement   Other Concerns:    SOCIAL WORK ASSESSMENT / PLAN CSW notified pt was admitted from facility and is ready to return. CSW visited pt room and spoke with pt daughter at bedside. Pt also present during assessment but working with PT so primary conversation was with pt daughter. Pt daughter confirmed that pt resides at [facility] and is active with hospice. Pt daughter informed CSW that she just discussed options with PT and pt and the plan will be for pt to return to ALF. CSW notified that plan is for dc today and pt daughter was already aware. Pt daughter plans to transport and informed CSW she will have pt oxygen tank available. CSW notified pt daughter of CSW to role to help coordinate with ALF. Pt daughter understanding that CSW will notify her once all paperwork has been completed and [facility] has reviewed and confirmed discharge.    CSW left voicemail for Wynonia Hazard health care coordinator at ALF to notify of dc.    Assessment/plan status:  Psychosocial Support/Ongoing Assessment of Needs Other assessment/ plan:   Information/referral to community resources:   None needed    PATIENT'S/FAMILY'S RESPONSE TO  PLAN OF CARE: Pt and pt daughter wanting pt to return to ALF.       Aloha Bartok, LCSWA [phone]

## 2014-02-15 NOTE — Telephone Encounter (Signed)
02/12/14  >> Hey, hope you have had a good afternoon. Could you please write an order for Nanny to wear CPAP QHS prn. [facility] needs on file fax # is [phone].    Please fax an order for the pt to "use her home CPAP machine, pre-existing settings, with her home mask and heated humidity, qhs prn for sleeping". Thanks. The fax is above. RB

## 2014-02-16 ENCOUNTER — Encounter: Payer: Self-pay | Admitting: Internal Medicine

## 2014-02-18 ENCOUNTER — Ambulatory Visit (INDEPENDENT_AMBULATORY_CARE_PROVIDER_SITE_OTHER): Admitting: Internal Medicine

## 2014-02-18 ENCOUNTER — Encounter: Payer: Self-pay | Admitting: Internal Medicine

## 2014-02-18 ENCOUNTER — Encounter: Payer: Medicare Other | Admitting: Internal Medicine

## 2014-02-18 VITALS — BP 146/80 | HR 83 | Resp 20 | Ht 64.0 in | Wt 158.0 lb

## 2014-02-18 DIAGNOSIS — I5032 Chronic diastolic (congestive) heart failure: Secondary | ICD-10-CM | POA: Diagnosis not present

## 2014-02-18 LAB — BASIC METABOLIC PANEL
BUN: 25 mg/dL — ABNORMAL HIGH (ref 6–23)
CO2: 27 mEq/L (ref 19–32)
Calcium: 9.2 mg/dL (ref 8.4–10.5)
Chloride: 103 mEq/L (ref 96–112)
Creatinine, Ser: 0.9 mg/dL (ref 0.4–1.2)
GFR: 61.7 mL/min (ref >60.00)
Glucose, Bld: 123 mg/dL — ABNORMAL HIGH (ref 70–99)
Potassium: 4.5 mEq/L (ref 3.5–5.1)
SODIUM: 138 meq/L (ref 135–145)

## 2014-02-18 LAB — BRAIN NATRIURETIC PEPTIDE: Pro B Natriuretic peptide (BNP): 1227 pg/mL — ABNORMAL HIGH (ref 0.0–100.0)

## 2014-02-18 NOTE — Progress Notes (Signed)
HPI Patient is an 80 yo with a history of HOCM, atrial fib, mitral stenosis s/p MVR, mild CAD, secondary pulm HTN and Type B aortic dissection   She is currently getting hospice care at home She was just d/c'd after fall  Has had several syncopal and near syncopal spells with little activity.  Activity overall is limited.    Allergies  Allergen Reactions  . Ivp Dye [Iodinated Diagnostic Agents] Itching  . Nitrofurantoin Itching  . Ramipril Other (See Comments)    unknown    Current Outpatient Prescriptions  Medication Sig Dispense Refill  . amiodarone (PACERONE) 100 MG tablet Take 100 mg by mouth daily.      [name] amoxicillin (AMOXIL) 500 MG capsule Take 2,000 mg by mouth once. One hour prior to dental appointment      . calcitonin, salmon, (MIACALCIN/FORTICAL) 200 UNIT/ACT nasal spray Place 1 spray into alternate nostrils daily.      . Calcium Carbonate-Vitamin D (CALCIUM 500 + D PO) Take 1 tablet by mouth 2 (two) times daily.       . cholecalciferol (VITAMIN D) 1000 UNITS tablet Take 1,000 Units by mouth at bedtime.       . ferrous sulfate 325 (65 FE) MG tablet Take 325 mg by mouth 2 (two) times a week. Tuesdays and Thursdays      . furosemide (LASIX) 20 MG tablet Take 1 tablet (20 mg total) by mouth daily.  30 tablet  6  . levothyroxine (SYNTHROID, LEVOTHROID) 125 MCG tablet Take 62.5 mcg by mouth daily before breakfast.      . metoprolol tartrate (LOPRESSOR) 25 MG tablet Take 1 tablet (25 mg total) by mouth 2 (two) times daily.  60 tablet  12  . nitroGLYCERIN (NITROSTAT) 0.4 MG SL tablet Place 0.4 mg under the tongue every 5 (five) minutes as needed for chest pain.      . potassium chloride SA (K-DUR,KLOR-CON) 20 MEQ tablet Take 20 mEq by mouth daily.       . Probiotic Product (PROBIOTIC DAILY PO) Take 1 tablet by mouth daily.      . sertraline (ZOLOFT) 100 MG tablet Take 100 mg by mouth daily.       No current facility-administered medications for this visit.    Past Medical History   Diagnosis Date  . CAD (coronary artery disease)   . Mitral valve disorder     a. s/p MVR;  b. 06/2013 Echo: EF 55-60%, no rwma, Ao sclerosis, mild AI, mild MR, miod dil LA, sev dil RV with reduced fxn, sev dil RA, mod TR/PR, PASP (study reviewed by Dr. Verlin Fester- MS felt to be sev, peak grad 39/mean 13).  [name] HTN (hypertension)   . Dyslipidemia   . Aortic stenosis, mild   . Breast cyst   . Hypertrophic cardiomyopathy   . Sleep apnea   . COPD (chronic obstructive pulmonary disease)     "a touch of"; prn o2 at home with activity   . Atrial fibrillation     a. chronic coumadin.  [name] Hypothyroidism   . Anemia   . Chronic right-sided heart failure     Past Surgical History  Procedure Laterality Date  . Laparoscopic cholecystectomy  2009  . Cataract extraction  2003    bilateral  . Mitral valve replacement  2007  . Neck mass excision  2003    benign  . Colonoscopy    . Broken wrist      left, metal implanted   .  Broken ankle      right, metal implanted   . Cardiac catheterization  06/2011  . Breast surgery      bilateral cysts x3 removed, benign   . Tee without cardioversion  01/31/2012    Procedure: TRANSESOPHAGEAL ECHOCARDIOGRAM (TEE);  Surgeon: Pricilla RifflePaula V Recie Cirrincione, MD;  [facility];  Service: Cardiovascular;  Laterality: N/A;  . Cardioversion N/A 01/16/2013    Procedure: CARDIOVERSION;  Surgeon: Vesta MixerPhilip J Nahser, MD;  [facility];  Service: Cardiovascular;  Laterality: N/A;    Family History  Problem Relation Age of Onset  . Diabetes Mother   . CVA Mother   . Other Father   . Diabetes Brother   . Cancer Brother     throat ca, smoker  . Aneurysm Brother     brain aneurysm  . Breast cancer Sister   . Emphysema Sister     smoker    History   Social History  . Marital Status: Married    Spouse Name: N/A    Number of Children: N/A  . Years of Education: N/A   Occupational History  . Retired    Social History Main Topics  . Smoking status: Former  Smoker -- 1.00 packs/day for 40 years    Types: Cigarettes    Quit date: 05/21/1990  . Smokeless tobacco: Never Used  . Alcohol Use: No  . Drug Use: No  . Sexual Activity: No   Other Topics Concern  . Not on file   Social History Narrative   Lives with her husband.  Drives and walks around without assist device.            Review of Systems:  All systems reviewed.  They are negative to the above problem except as previously stated.  Vital Signs: Wt 158 lb (71.668 kg)  SpO2 90%  Physical Exam Patient is in NAD HEENT:  Normocephalic, atraumatic. EOMI, PERRLA.  Neck: JVP is normal.  No bruits.  Lungs: clear to auscultation. No rales no wheezes.  Heart: Irregular rate and rhythm. Normal S1, S2. No S3.   No significant murmurs. PMI not displaced.  Abdomen:  Supple, nontender. Normal bowel sounds. No masses. No hepatomegaly.  Extremities:   Good distal pulses throughout. No lower extremity edema.  Musculoskeletal :moving all extremities.  Neuro:   alert and oriented x3.  CN II-XII grossly intact.   Assessment and Plan:  1. Pulmonary HTN  Pt now with recurrent syncope.  Appears to decompensate with  Desaturations.  Discussed with R Byrum.  WIll increase oxygen Patient needs more support for daily living, optimally around the clock.  If not available where she is now, would recomm transfer.  2.   Atrial fib  Keep on amio for rate control   Not anticoag candidate    3.  Acute on chronic diastolic CHF   Volume status is up some CHeck labs and dose lasix as needed  5.  MV disease.  S/p MVR  With mitral stenosis.  She is not a candidate for intervention  6.  HOCM  Cont current meds  7.  Dissecting aortic aneurysm.

## 2014-02-18 NOTE — Patient Instructions (Signed)
Your physician recommends that you return for lab work in: today (BMET, BNP) Your physician will be in touch with you regarding follow up.

## 2014-02-19 ENCOUNTER — Telehealth: Payer: Self-pay | Admitting: *Deleted

## 2014-02-19 NOTE — Telephone Encounter (Signed)
There was a voicemail left on the refill line from [facility] stating that the new order for lasix needs to be faxed to the new fax number, [phone]. Please advise. Thanks, MI

## 2014-02-19 NOTE — Telephone Encounter (Signed)
Done. Thank you.

## 2014-02-24 ENCOUNTER — Other Ambulatory Visit: Payer: Self-pay | Admitting: Internal Medicine

## 2014-02-24 MED ORDER — AMIODARONE HCL 200 MG PO TABS: 100.0000 mg | ORAL_TABLET | Freq: Every day | ORAL | Status: DC

## 2014-03-01 ENCOUNTER — Telehealth: Payer: Self-pay | Admitting: *Deleted

## 2014-03-01 NOTE — Telephone Encounter (Signed)
[facility] Reports pt continues passing out related to movement even turning side to side in bed. She wonders if this is related to an inner ear issue, and wanted to let Dr. Tenny Crawoss know. Advised of pts heart failure and issue with high pulmonary pressures that the doctors feel is the cause of her frequent "spells". She states she took orthostatic BPs today, pt was not orthostatic, no HRs provided.   Lying-120/60 Sit - 120/66 Stand - 118/60

## 2014-03-02 ENCOUNTER — Telehealth: Payer: Self-pay | Admitting: *Deleted

## 2014-03-02 ENCOUNTER — Encounter: Payer: Self-pay | Admitting: Internal Medicine

## 2014-03-02 NOTE — Telephone Encounter (Signed)
[facility] Wanted to know if I heard back from Dr. Tenny Crawoss re: ? Inner ear issue Advised with Dr. Tenny Crawoss comments.  "I don't think related to ear problem  I would keep on same regimen."  Velna HatchetSheila verbalized understanding.  "I will let her daughter know".

## 2014-03-02 NOTE — Telephone Encounter (Signed)
I don't think related to ear problem   I would keep on same regimen.

## 2014-03-07 ENCOUNTER — Emergency Department (HOSPITAL_COMMUNITY)
Admission: EM | Admit: 2014-03-07 | Discharge: 2014-03-07 | Disposition: A | Discharge status: Home / Self Care | Payer: Medicare Other | Attending: Emergency Medicine | Admitting: Emergency Medicine

## 2014-03-07 ENCOUNTER — Encounter (HOSPITAL_COMMUNITY): Payer: Self-pay | Admitting: Emergency Medicine

## 2014-03-07 DIAGNOSIS — I359 Nonrheumatic aortic valve disorder, unspecified: Secondary | ICD-10-CM

## 2014-03-07 DIAGNOSIS — I2510 Atherosclerotic heart disease of native coronary artery without angina pectoris: Secondary | ICD-10-CM | POA: Diagnosis not present

## 2014-03-07 DIAGNOSIS — Z9981 Dependence on supplemental oxygen: Secondary | ICD-10-CM | POA: Diagnosis not present

## 2014-03-07 DIAGNOSIS — I482 Chronic atrial fibrillation, unspecified: Secondary | ICD-10-CM

## 2014-03-07 DIAGNOSIS — S0990XA Unspecified injury of head, initial encounter: Secondary | ICD-10-CM

## 2014-03-07 DIAGNOSIS — S0083XA Contusion of other part of head, initial encounter: Secondary | ICD-10-CM | POA: Diagnosis not present

## 2014-03-07 DIAGNOSIS — Z8742 Personal history of other diseases of the female genital tract: Secondary | ICD-10-CM | POA: Diagnosis not present

## 2014-03-07 DIAGNOSIS — Z9889 Other specified postprocedural states: Secondary | ICD-10-CM | POA: Diagnosis not present

## 2014-03-07 DIAGNOSIS — J449 Chronic obstructive pulmonary disease, unspecified: Secondary | ICD-10-CM | POA: Insufficient documentation

## 2014-03-07 DIAGNOSIS — I7101 Dissection of thoracic aorta: Secondary | ICD-10-CM

## 2014-03-07 DIAGNOSIS — S4992XA Unspecified injury of left shoulder and upper arm, initial encounter: Secondary | ICD-10-CM | POA: Insufficient documentation

## 2014-03-07 DIAGNOSIS — I421 Obstructive hypertrophic cardiomyopathy: Secondary | ICD-10-CM

## 2014-03-07 DIAGNOSIS — I4891 Unspecified atrial fibrillation: Secondary | ICD-10-CM | POA: Insufficient documentation

## 2014-03-07 DIAGNOSIS — Y92128 Other place in nursing home as the place of occurrence of the external cause: Secondary | ICD-10-CM | POA: Diagnosis not present

## 2014-03-07 DIAGNOSIS — Z87891 Personal history of nicotine dependence: Secondary | ICD-10-CM | POA: Insufficient documentation

## 2014-03-07 DIAGNOSIS — Z7901 Long term (current) use of anticoagulants: Secondary | ICD-10-CM | POA: Insufficient documentation

## 2014-03-07 DIAGNOSIS — I509 Heart failure, unspecified: Secondary | ICD-10-CM | POA: Insufficient documentation

## 2014-03-07 DIAGNOSIS — I5033 Acute on chronic diastolic (congestive) heart failure: Secondary | ICD-10-CM

## 2014-03-07 DIAGNOSIS — I10 Essential (primary) hypertension: Secondary | ICD-10-CM | POA: Diagnosis not present

## 2014-03-07 DIAGNOSIS — W06XXXA Fall from bed, initial encounter: Secondary | ICD-10-CM | POA: Diagnosis not present

## 2014-03-07 DIAGNOSIS — Z66 Do not resuscitate: Secondary | ICD-10-CM

## 2014-03-07 DIAGNOSIS — R4020 Unspecified coma: Secondary | ICD-10-CM | POA: Diagnosis present

## 2014-03-07 DIAGNOSIS — Y9389 Activity, other specified: Secondary | ICD-10-CM | POA: Diagnosis not present

## 2014-03-07 DIAGNOSIS — E785 Hyperlipidemia, unspecified: Secondary | ICD-10-CM | POA: Diagnosis not present

## 2014-03-07 DIAGNOSIS — I71012 Dissection of descending thoracic aorta: Secondary | ICD-10-CM

## 2014-03-07 DIAGNOSIS — R55 Syncope and collapse: Secondary | ICD-10-CM

## 2014-03-07 DIAGNOSIS — E039 Hypothyroidism, unspecified: Secondary | ICD-10-CM | POA: Insufficient documentation

## 2014-03-07 DIAGNOSIS — D649 Anemia, unspecified: Secondary | ICD-10-CM | POA: Insufficient documentation

## 2014-03-07 DIAGNOSIS — Z79899 Other long term (current) drug therapy: Secondary | ICD-10-CM | POA: Diagnosis not present

## 2014-03-07 LAB — CBC
HCT: 36.5 % (ref 36.0–46.0)
Hemoglobin: 11.3 g/dL — ABNORMAL LOW (ref 12.0–15.0)
MCH: 27.4 pg (ref 26.0–34.0)
MCHC: 31 g/dL (ref 30.0–36.0)
MCV: 88.6 fL (ref 78.0–100.0)
PLATELETS: 121 10*3/uL — AB (ref 150–400)
RBC: 4.12 MIL/uL (ref 3.87–5.11)
RDW: 16.9 % — ABNORMAL HIGH (ref 11.5–15.5)
WBC: 8.8 10*3/uL (ref 4.0–10.5)

## 2014-03-07 LAB — BASIC METABOLIC PANEL
ANION GAP: 11 (ref 5–15)
BUN: 18 mg/dL (ref 6–23)
CHLORIDE: 103 meq/L (ref 96–112)
CO2: 27 meq/L (ref 19–32)
Calcium: 8.9 mg/dL (ref 8.4–10.5)
Creatinine, Ser: 0.7 mg/dL (ref 0.50–1.10)
GFR calc Af Amer: 90 mL/min — ABNORMAL LOW (ref >90)
GFR calc non Af Amer: 77 mL/min — ABNORMAL LOW (ref >90)
Glucose, Bld: 127 mg/dL — ABNORMAL HIGH (ref 70–99)
POTASSIUM: 3.8 meq/L (ref 3.7–5.3)
SODIUM: 141 meq/L (ref 137–147)

## 2014-03-07 LAB — PRO B NATRIURETIC PEPTIDE: PRO B NATRI PEPTIDE: 4865 pg/mL — AB (ref 0–450)

## 2014-03-07 MED ORDER — LORAZEPAM 0.5 MG PO TABS: 0.5000 mg | ORAL_TABLET | ORAL | Status: DC | PRN

## 2014-03-07 MED ORDER — FUROSEMIDE 20 MG PO TABS: 20.0000 mg | ORAL_TABLET | Freq: Two times a day (BID) | ORAL | Status: DC

## 2014-03-07 NOTE — ED Provider Notes (Signed)
CSN: [id number]     Arrival date & time 03/07/14  1201 History   First MD Initiated Contact with Patient 03/07/14 1218     Chief Complaint  Patient presents with  . Loss of Consciousness     (Consider location/radiation/quality/duration/timing/severity/associated sxs/prior Treatment) Patient is a 80 y.o. female presenting with syncope.  Loss of Consciousness Episode history:  Single Most recent episode:  Today Duration: brief. Timing:  Constant Progression since onset: once. Chronicity:  New Context: sitting down   Context comment:  Sitting on the edge of her bed. She has had multiple, similar, unprovoked episodes of syncope in the last few weeks. Witnessed: yes   Relieved by:  Nothing Associated symptoms: dizziness   Associated symptoms: no chest pain   Associated symptoms comment:  Seeing colors   Past Medical History  Diagnosis Date  . CAD (coronary artery disease)   . Mitral valve disorder     a. s/p MVR;  b. 06/2013 Echo: EF 55-60%, no rwma, Ao sclerosis, mild AI, mild MR, miod dil LA, sev dil RV with reduced fxn, sev dil RA, mod TR/PR, PASP 60mmHg (study reviewed by Dr. Verlin FesterKN- MS felt to be sev, peak grad 39/mean 13).  [name]. HTN (hypertension)   . Dyslipidemia   . Aortic stenosis, mild   . Breast cyst   . Hypertrophic cardiomyopathy   . Sleep apnea   . COPD (chronic obstructive pulmonary disease)     "a touch of"; prn o2 at home with activity   . Atrial fibrillation     a. chronic coumadin.  [name]. Hypothyroidism   . Anemia   . Chronic right-sided heart failure    Past Surgical History  Procedure Laterality Date  . Laparoscopic cholecystectomy  2009  . Cataract extraction  2003    bilateral  . Mitral valve replacement  2007  . Neck mass excision  2003    benign  . Colonoscopy    . Broken wrist      left, metal implanted   . Broken ankle      right, metal implanted   . Cardiac catheterization  06/2011  . Breast surgery      bilateral cysts x3 removed, benign   .  Tee without cardioversion  01/31/2012    Procedure: TRANSESOPHAGEAL ECHOCARDIOGRAM (TEE);  Surgeon: Pricilla RifflePaula V Ross, MD;  [facility];  Service: Cardiovascular;  Laterality: N/A;  . Cardioversion N/A 01/16/2013    Procedure: CARDIOVERSION;  Surgeon: Vesta MixerPhilip J Nahser, MD;  [facility];  Service: Cardiovascular;  Laterality: N/A;   Family History  Problem Relation Age of Onset  . Diabetes Mother   . CVA Mother   . Other Father   . Diabetes Brother   . Cancer Brother     throat ca, smoker  . Aneurysm Brother     brain aneurysm  . Breast cancer Sister   . Emphysema Sister     smoker   History  Substance Use Topics  . Smoking status: Former Smoker -- 1.00 packs/day for 40 years    Types: Cigarettes    Quit date: 05/21/1990  . Smokeless tobacco: Never Used  . Alcohol Use: No   OB History   Grav Para Term Preterm Abortions TAB SAB Ect Mult Living                 Review of Systems  Cardiovascular: Positive for syncope. Negative for chest pain.  Neurological: Positive for dizziness.  All other systems reviewed and are negative.  Allergies  Ivp dye; Nitrofurantoin; and Ramipril  Home Medications   Prior to Admission medications   Medication Sig Start Date End Date Taking? Authorizing Provider  amiodarone (PACERONE) 200 MG tablet Take 0.5 tablets (100 mg total) by mouth daily. 02/24/14  Yes Pricilla RifflePaula Ross V, MD  amoxicillin (AMOXIL) 500 MG capsule Take 2,000 mg by mouth once. One hour prior to dental appointment 02/09/14  Yes Historical Provider, MD  calcitonin, salmon, (MIACALCIN/FORTICAL) 200 UNIT/ACT nasal spray Place 1 spray into alternate nostrils daily. 01/23/14  Yes Historical Provider, MD  Calcium Carb-Cholecalciferol (CALCIUM + D3) 600-200 MG-UNIT TABS Take 1 tablet by mouth 2 (two) times daily.   Yes Historical Provider, MD  cholecalciferol (VITAMIN D) 1000 UNITS tablet Take 1,000 Units by mouth at bedtime.    Yes Historical Provider, MD  ferrous sulfate  325 (65 FE) MG tablet Take 325 mg by mouth 2 (two) times a week. Tuesdays and Thursdays   Yes Historical Provider, MD  furosemide (LASIX) 20 MG tablet Take 1 tablet (20 mg total) by mouth daily. 11/09/13  Yes Pricilla RifflePaula Ross V, MD  levothyroxine (SYNTHROID, LEVOTHROID) 125 MCG tablet Take 62.5 mcg by mouth daily before breakfast.   Yes Historical Provider, MD  LORazepam (ATIVAN) 0.5 MG tablet Take 0.5 mg by mouth every 2 (two) hours as needed for anxiety or seizure.   Yes Historical Provider, MD  metoprolol tartrate (LOPRESSOR) 25 MG tablet Take 1 tablet (25 mg total) by mouth 2 (two) times daily. 09/21/13  Yes Pricilla RifflePaula Ross V, MD  naproxen (NAPROSYN) 500 MG tablet Take 500 mg by mouth daily as needed for moderate pain.   Yes Historical Provider, MD  nitroGLYCERIN (NITROSTAT) 0.4 MG SL tablet Place 0.4 mg under the tongue every 5 (five) minutes as needed for chest pain.   Yes Historical Provider, MD  polyethylene glycol (MIRALAX / GLYCOLAX) packet Take 17 g by mouth daily as needed for moderate constipation.   Yes Historical Provider, MD  potassium chloride SA (K-DUR,KLOR-CON) 20 MEQ tablet Take 20 mEq by mouth 2 (two) times daily.    Yes Historical Provider, MD  Pramox-PE-Glycerin-Petrolatum (PREPARATION H) 1-0.25-14.4-15 % CREA Place 1 application rectally daily as needed (for complications of hemoroids.).   Yes Historical Provider, MD  Probiotic Product (PROBIOTIC DAILY PO) Take 1 tablet by mouth daily.   Yes Historical Provider, MD  sertraline (ZOLOFT) 100 MG tablet Take 100 mg by mouth daily.   Yes Historical Provider, MD   BP 134/69  Pulse 69  Temp(Src) 98.5 F (36.9 C) (Oral)  Resp 19  SpO2 96% Physical Exam  Nursing note and vitals reviewed. Constitutional: She is oriented to person, place, and time. She appears well-developed and well-nourished. No distress.  HENT:  Head: Normocephalic and atraumatic.  Mouth/Throat: Oropharynx is clear and moist.  Eyes: Conjunctivae are normal. Pupils are  equal, round, and reactive to light. No scleral icterus.  Neck: Neck supple.  Cardiovascular: Normal rate, normal heart sounds and intact distal pulses.  An irregularly irregular rhythm present.  No murmur heard. Pulmonary/Chest: Effort normal and breath sounds normal. No stridor. No respiratory distress. She has no rales.  Abdominal: Soft. Bowel sounds are normal. She exhibits no distension. There is no tenderness.  Musculoskeletal: Normal range of motion.  Neurological: She is alert and oriented to person, place, and time.  Skin: Skin is warm and dry. No rash noted.  Psychiatric: She has a normal mood and affect. Her behavior is normal.    ED Course  Procedures (including critical care time) Labs Review Labs Reviewed  CBC - Abnormal; Notable for the following:    Hemoglobin 11.3 (*)    RDW 16.9 (*)    Platelets 121 (*)    All other components within normal limits  BASIC METABOLIC PANEL - Abnormal; Notable for the following:    Glucose, Bld 127 (*)    GFR calc non Af Amer 77 (*)    GFR calc Af Amer 90 (*)    All other components within normal limits  PRO B NATRIURETIC PEPTIDE - Abnormal; Notable for the following:    Pro B Natriuretic peptide (BNP) 4865.0 (*)    All other components within normal limits    Imaging Review No results found.   EKG Interpretation   Date/Time:  Sunday March 07 2014 12:13:47 EDT Ventricular Rate:  73 PR Interval:    QRS Duration: 126 QT Interval:  482 QTC Calculation: 531 R Axis:   -60 Text Interpretation:  Atrial fibrillation RBBB and LAFB Left ventricular  hypertrophy Anterior Q waves, possibly due to LVH No significant change  was found Confirmed by [facility]  MD, TREY ([id number]) on 03/07/2014 4:28:39 PM      MDM   Final diagnoses:  Syncope, unspecified syncope type    80 yo female with hx of a fib, end stage CHF, on hospice care who presents after another recurrent episode of syncope resulting in a fall from bed to the floor. The  patient well appearing on my exam with a mild contusion to the left side of her forehead. Otherwise she complained of some mild left shoulder pain without any apparent bony injuries.  We had a long discussion about her goals of care with both the patient and her daughter. Ultimately, the patient understands that she has end-stage heart failure and does not wish to pursue further evaluation for possible traumatic injuries (such as head CT, cervical spine CT, left shoulder x-ray) and does not wish to pursue inpatient hospitalization for likely cardiogenic syncope.    I also discussed her case with cardiology, who consulted on patient in the ED. Ultimately, we felt that it was reasonable that patient be discharged back to her care facility with continued hospice care. Cardiology recommended increasing her Lasix and increasing her Ativan for comfort.  Dr. Delton See (Cardiology) will also set up a close follow up appointment.    Warnell Forester, MD 03/08/14 1300

## 2014-03-07 NOTE — ED Notes (Addendum)
[facility] Pt fell appx 2 weeks ago causing right orbital fracture. Pt reports since this fall she has had frequenct syncopal episodes that happen while standing or laying. Pt has been seen by Cardiology for same. Pt had another witnessed fall this AM from sitting, states she got dizzy and hit the floor hitting left head and left shoulder. Brief LOC. PT is AO x 4. 80 bpm. 113/82. CBG 154. RR 16. 99% 5L [place] (wears this at baseline).

## 2014-03-07 NOTE — Consult Note (Signed)
CARDIOLOGY CONSULT NOTE   Patient ID: Patricia [name] MRN: [id number], DOB/AGE: [birth year]   Admit date: 03/07/2014 Date of Consult: 03/07/2014  Primary Physician: Londell MohPHARR,WALTER DAVIDSON, MD Primary Cardiologist: Dr. Dietrich PatesPaula Ross  Reason for consult:  Recurrent syncope  Problem List  Past Medical History  Diagnosis Date  . CAD (coronary artery disease)   . Mitral valve disorder     a. s/p MVR;  b. 06/2013 Echo: EF 55-60%, no rwma, Ao sclerosis, mild AI, mild MR, miod dil LA, sev dil RV with reduced fxn, sev dil RA, mod TR/PR, PASP 60mmHg (study reviewed by Dr. Verlin FesterKN- MS felt to be sev, peak grad 39/mean 13).  [name]. HTN (hypertension)   . Dyslipidemia   . Aortic stenosis, mild   . Breast cyst   . Hypertrophic cardiomyopathy   . Sleep apnea   . COPD (chronic obstructive pulmonary disease)     "a touch of"; prn o2 at home with activity   . Atrial fibrillation     a. chronic coumadin.  [name]. Hypothyroidism   . Anemia   . Chronic right-sided heart failure     Past Surgical History  Procedure Laterality Date  . Laparoscopic cholecystectomy  2009  . Cataract extraction  2003    bilateral  . Mitral valve replacement  2007  . Neck mass excision  2003    benign  . Colonoscopy    . Broken wrist      left, metal implanted   . Broken ankle      right, metal implanted   . Cardiac catheterization  06/2011  . Breast surgery      bilateral cysts x3 removed, benign   . Tee without cardioversion  01/31/2012    Procedure: TRANSESOPHAGEAL ECHOCARDIOGRAM (TEE);  Surgeon: Pricilla RifflePaula V Ross, MD;  [facility];  Service: Cardiovascular;  Laterality: N/A;  . Cardioversion N/A 01/16/2013    Procedure: CARDIOVERSION;  Surgeon: Vesta MixerPhilip J Nahser, MD;  [facility];  Service: Cardiovascular;  Laterality: N/A;    Allergies  Allergies  Allergen Reactions  . Ivp Dye [Iodinated Diagnostic Agents] Itching  . Nitrofurantoin Itching  . Ramipril Other (See Comments)    unknown    HPI    Patricia [name] is a 80 y.o. female with a history of HOCM, Chronic Afib, mitral stenosis s/p MVR, mild CAD, secondary pulm HTN, chronic resp failure on 2.5L home O2, Mild COPD and Type B aortic dissection ( managed non operatively), also followed by Hospice currently a resident of ALF was seen in the ER yetserday after what seems like a syncopal episode, she was found to have significant bruising and echymosis around R eye and an orbital fracture, she was monitored in ER and discharged back after local wound care and Dermabond Rx of lacerations to FU with OMFS next week.  Today while getting out of the shower she passed out and was found down this time hitting the middle of her head.  She was sent to the ER again for eval and Seen by Cardiology who [facility] etc.  She denies any chest pain, palpitations, no fever or chills.  She reports chronic dyspnea on exertion at baseline  This is recurrent problem, Dr Tenny Crawoss saw her on 02/18/2014 when she was stable. She was called afterwards for fall and orthostatics were checked and negative.   Inpatient Medications   Family History Family History  Problem Relation Age of Onset  . Diabetes Mother   .  CVA Mother   . Other Father   . Diabetes Brother   . Cancer Brother     throat ca, smoker  . Aneurysm Brother     brain aneurysm  . Breast cancer Sister   . Emphysema Sister     smoker     Social History History   Social History  . Marital Status: Married    Spouse Name: N/A    Number of Children: N/A  . Years of Education: N/A   Occupational History  . Retired    Social History Main Topics  . Smoking status: Former Smoker -- 1.00 packs/day for 40 years    Types: Cigarettes    Quit date: 05/21/1990  . Smokeless tobacco: Never Used  . Alcohol Use: No  . Drug Use: No  . Sexual Activity: No   Other Topics Concern  . Not on file   Social History Narrative   Lives with her husband.  Drives  and walks around without assist device.             Review of Systems  General:  No chills, fever, night sweats or weight changes.  Cardiovascular:  No chest pain, dyspnea on exertion, edema, orthopnea, palpitations, paroxysmal nocturnal dyspnea. Dermatological: No rash, lesions/masses Respiratory: No cough, dyspnea Urologic: No hematuria, dysuria Abdominal:   No nausea, vomiting, diarrhea, bright red blood per rectum, melena, or hematemesis Neurologic:  No visual changes, wkns, changes in mental status. All other systems reviewed and are otherwise negative except as noted above.  Physical Exam  Blood pressure 134/69, pulse 69, temperature 98.5 F (36.9 C), temperature source Oral, resp. rate 19, SpO2 96.00%.  General: Pleasant, NAD Psych: Normal affect. Neuro: Alert and oriented X 3. Moves all extremities spontaneously. HEENT: Normal  Neck: Supple without bruits or JVD. Lungs:  Resp regular and unlabored, rales B/L. Heart: RRR no s3, s4, 4/6 holosystolic murmur Abdomen: Soft, non-tender, non-distended, BS + x 4.  Extremities: No clubbing, cyanosis or edema. DP/PT/Radials 2+ and equal bilaterally.  Labs  No results found for this basename: CKTOTAL, CKMB, TROPONINI,  in the last 72 hours Lab Results  Component Value Date   WBC 8.8 03/07/2014   HGB 11.3* 03/07/2014   HCT 36.5 03/07/2014   MCV 88.6 03/07/2014   PLT 121* 03/07/2014    Recent Labs Lab 03/07/14 1247  NA 141  K 3.8  CL 103  CO2 27  BUN 18  CREATININE 0.70  CALCIUM 8.9  GLUCOSE 127*   Lab Results  Component Value Date   CHOL 156 07/13/2013   HDL 31* 07/13/2013   LDLCALC 109* 07/13/2013   TRIG 79 07/13/2013   Radiology/Studies  Dg Chest 2 View  02/13/2014   CLINICAL DATA:  Fall yesterday. Hit her head. History of CHF, valve replacement. Oxygen requirement.  EXAM: CHEST  2 VIEW  COMPARISON:  09/06/2013, 08/20/2013  FINDINGS: The heart is enlarged. Patient has had median sternotomy and valve  replacement. There is prominence of the pulmonary arteries segments. Perihilar airspace filling is noted bilaterally, right greater than left. Findings are consistent with pulmonary edema. There are no focal consolidations. Suspect small bilateral pleural effusions. There has been further compression of T12 since prior studies.  IMPRESSION: 1. Cardiomegaly and pulmonary edema. 2. Further compression of T12.   Electronically Signed   By: Rosalie Gums M.D.   On: 02/13/2014 18:28   Ct Maxillofacial Wo Cm  02/12/2014   CLINICAL DATA:  80 year old female with fall with head, face  and neck injury and pain. Right high/orbit bruising.   IMPRESSION: Right orbital floor fracture displaced up to 8 mm laterally. Herniation of fat without definite extraocular muscle herniation through the fracture site but correlate clinically.  No evidence of acute intracranial abnormality.  No static evidence of acute injury to the cervical spine.  Mild to moderate cerebral atrophy, chronic small-vessel white matter ischemic changes, remote right cerebellar infarct, and mild to moderate multilevel cervical spine degenerative changes.   Electronically Signed   By: Laveda AbbeJeff  Hu M.D.   On: 02/12/2014 15:05    Echocardiogram - 06/2013 - Left ventricle: The cavity size was normal. There was moderate concentric hypertrophy. Systolic function was normal. The estimated ejection fraction was in the range of 55% to 60%. Wall motion was normal; there were no regional wall motion abnormalities. - Aortic valve: Severe thickening and calcification, consistent with sclerosis. Mild regurgitation. - Mitral valve: Mild regurgitation. - Left atrium: The atrium was moderately dilated. - Right ventricle: The cavity size was severely dilated. Wall thickness was normal. Systolic function was severely reduced. - Right atrium: The atrium was severely dilated. - Tricuspid valve: Moderate regurgitation. - Pulmonic valve: Moderate regurgitation. -  Pulmonary arteries: PA peak pressure: 60mm Hg (S). Impressions:  - The right ventricular systolic pressure was increased consistent with severe pulmonary hypertension.   ECG: a-fib, RBBB, LAFB, LVH, unchanged from prior    ASSESSMENT AND PLAN  1. Acute on chronic diastolic CHF - BNP 4800, was 1200 on 02/18/2014, she had mild pulmonary congestion on CXR from 02/13/2014 I would increase Lasix to 40 mg PO BID x 2 days, then 20 mg PO BID and follow up in our clinic in 10-14 days.  2. Recurrent fall - this is difficult situation - combination of HCM, moderate pulmonary hypertension. She won't tolerate more betablockers to avoid LVOT obstruction. The only way to deal with it is prevention. I had discussion with her daughters and the patients, she understands.  3. Atrial fib - rate controlled  4. s/p MVR With mitral stenosis - She is not a candidate for intervention   5. HOCM Cont current meds   6. Insomnia - I would increase xanax from 0.5 to 1 mg QHS  Patient to continue to be followed by hospice/palliative care.  We will arrange for an outpatient follow up.   Signed, Lars MassonNELSON, Nykira Reddix H, MD, [facility] 03/07/2014, 3:36 PM

## 2014-03-24 ENCOUNTER — Encounter: Payer: Self-pay | Admitting: Internal Medicine

## 2014-03-30 ENCOUNTER — Encounter: Payer: Self-pay | Admitting: Internal Medicine

## 2014-04-06 ENCOUNTER — Encounter: Payer: Self-pay | Admitting: Internal Medicine

## 2014-04-20 ENCOUNTER — Encounter: Payer: Self-pay | Admitting: Internal Medicine

## 2014-04-30 ENCOUNTER — Other Ambulatory Visit: Payer: Self-pay | Admitting: Internal Medicine

## 2014-05-17 ENCOUNTER — Encounter: Payer: Self-pay | Admitting: Internal Medicine

## 2014-05-20 ENCOUNTER — Encounter: Payer: Self-pay | Admitting: Internal Medicine

## 2014-06-04 ENCOUNTER — Other Ambulatory Visit: Payer: Self-pay | Admitting: Internal Medicine

## 2014-06-04 NOTE — Telephone Encounter (Signed)
Should this be sent to pcp? Please advise. Thanks, MI 

## 2014-07-02 ENCOUNTER — Telehealth: Payer: Self-pay | Admitting: Internal Medicine

## 2014-07-02 NOTE — Telephone Encounter (Signed)
New Msg       Patricia MaffucciJudy Walker [facility]  Needs continuing order for BMET and CBC for pt.   Please return call to [phone].

## 2014-07-02 NOTE — Telephone Encounter (Signed)
Patricia [name]/Hospice called requesting order to be continued for routine labs of BMET and CBC. Dr. Tenny Crawoss ordered BMET and CBC q6 weeks. [name] stated they will draw next labs at the 6 week mark and then send the results.

## 2014-07-06 ENCOUNTER — Other Ambulatory Visit: Payer: Self-pay | Admitting: Internal Medicine

## 2014-07-08 ENCOUNTER — Other Ambulatory Visit: Payer: Self-pay

## 2014-07-08 MED ORDER — AMIODARONE HCL 200 MG PO TABS: 100.0000 mg | ORAL_TABLET | Freq: Every day | ORAL | Status: DC

## 2014-08-05 IMAGING — CT CT ANGIO CHEST
2 of 9 series · 14 of 36 positions shown · IV contrast (CONTRAST)
Comparison: CT scan of the head and cervical spine obtained earlier
today

Prior CT scan of the chest 06/07/2011

ADDENDUM:
To be more clear, the focal disruption into the aortic media and
associated intramural contrast pool occurs from the medial aspect of
the true lumen. It is unlikely that this represents an acute injury
related to the patient's fall from standing. This is likely
chronic/subacute having occurred at some point since the prior
imaging in Sunday May, 2011.

Regardless, there has been a marked interval increase in the size of
the transverse and proximal descending thoracic aorta and the
combination of dissection and intramural contrast extravasation is
high risk for rupture. Recommend consultation with cardiothoracic
and/or vascular surgery prior to discharge for further evaluation as
the patient may be a candidate for thoracic endovascular aortic
repair.
CLINICAL DATA: Fall, aortic dissection a seen on CT scan of the
cervical spine
EXAM:
CT ANGIOGRAPHY CHEST, ABDOMEN AND PELVIS
TECHNIQUE: Multidetector CT imaging through the chest, abdomen and pelvis was
performed using the standard protocol during bolus administration of
intravenous contrast. Multiplanar reconstructed images and MIPs were
obtained and reviewed to evaluate the vascular anatomy.
CONTRAST:  100mL OMNIPAQUE IOHEXOL 350 MG/ML SOLN

[Series 6: thins · axial · 0.77mm/px · z∈[+119,+650]mm · 13 of 621 slices shown]
[im 45/621  lung]
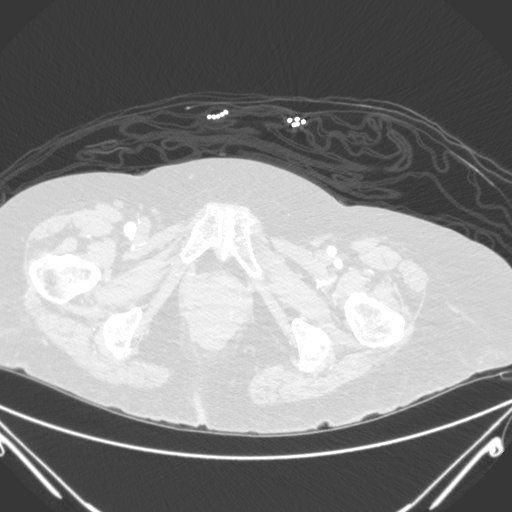
[im 89/621  mediastinal]
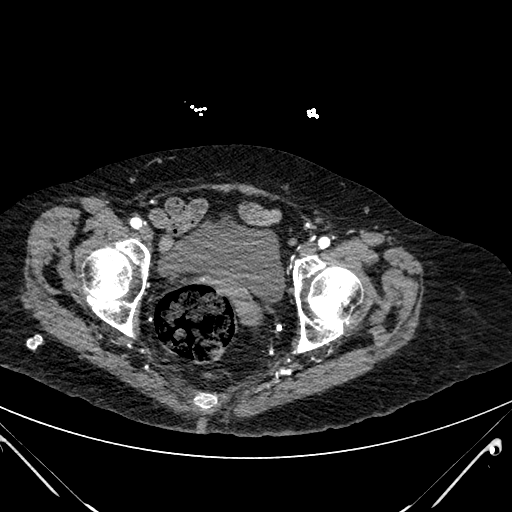
[im 133/621  lung]
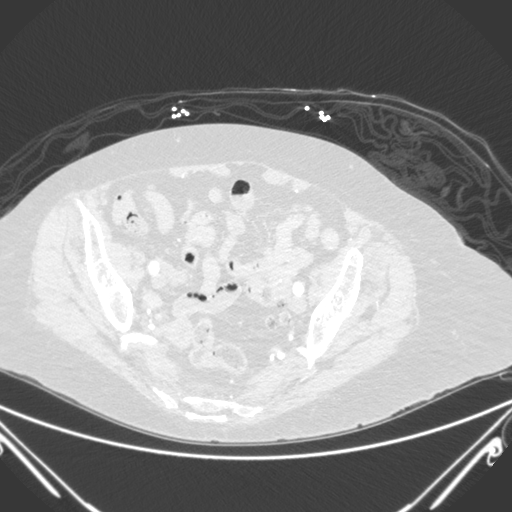
[im 178/621  mediastinal]
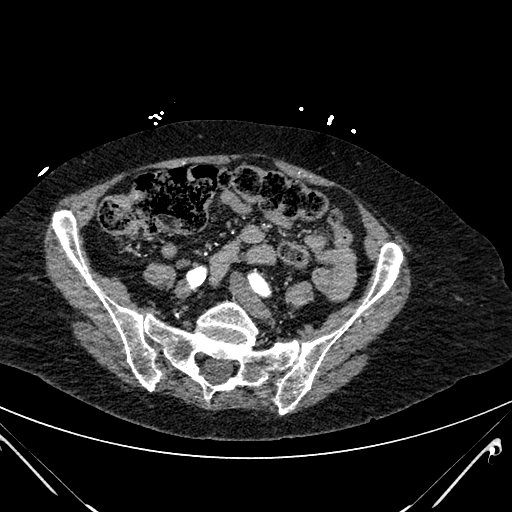
[im 222/621  lung]
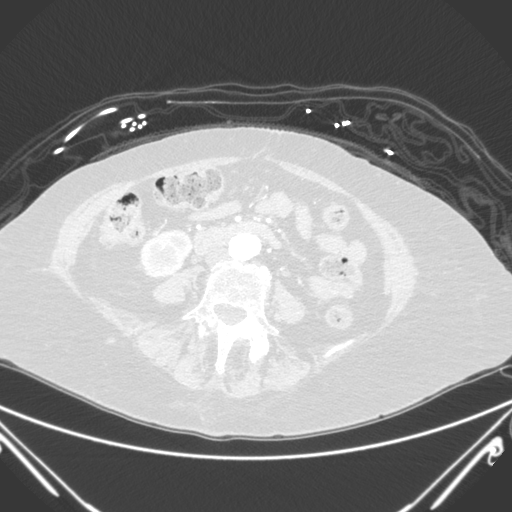
[im 266/621  mediastinal]
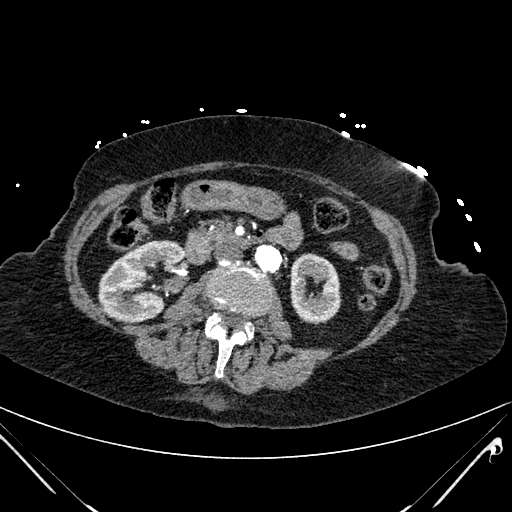
[im 311/621  lung]
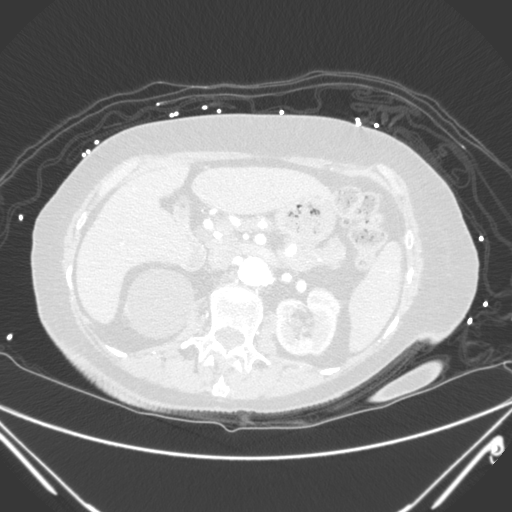
[im 355/621  mediastinal]
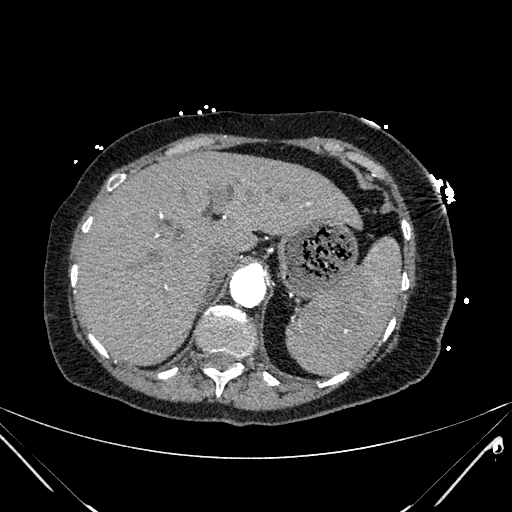
[im 399/621  lung]
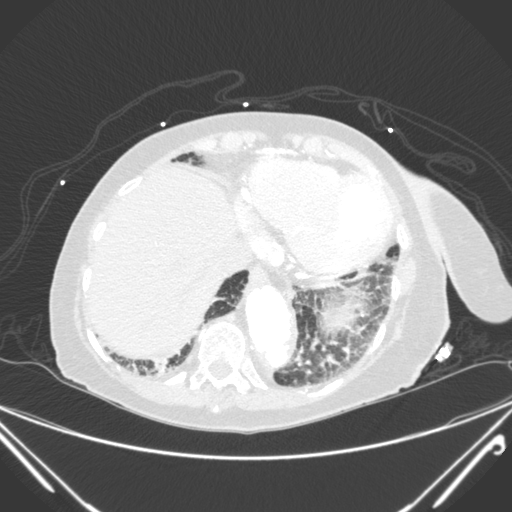
[im 443/621  mediastinal]
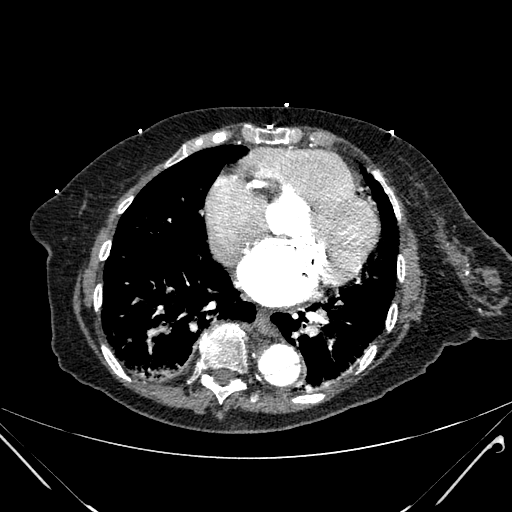
[im 488/621  lung]
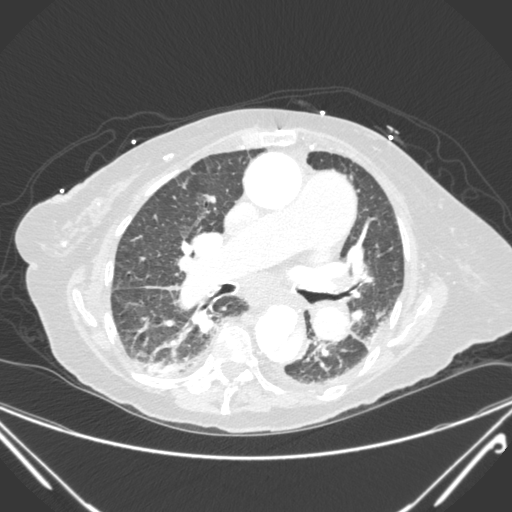
[im 532/621  mediastinal]
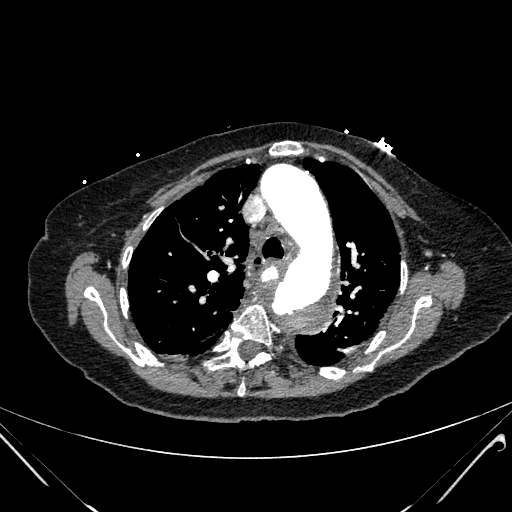
[im 576/621  lung]
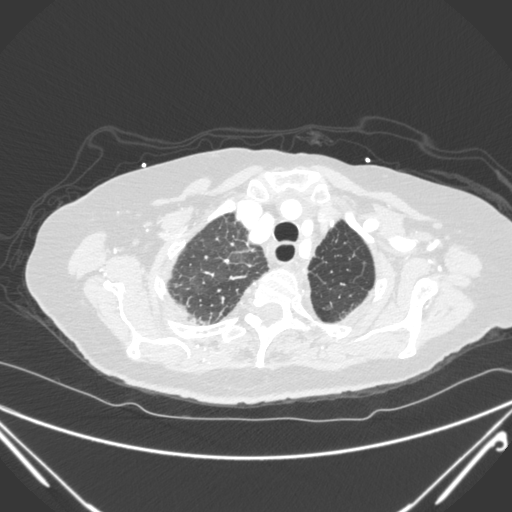

[mpr, coronals, coronal · coronal · 1.21mm/px · 1 of 113 slices shown]
[im 57/113  mediastinal]
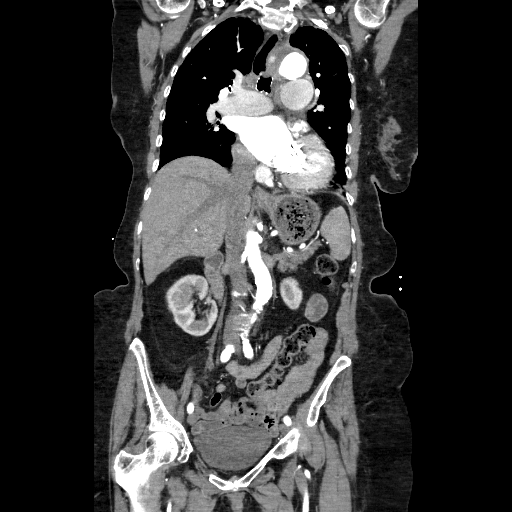

[14 of 36 positions shown; findings below may reference images not displayed]

FINDINGS: CTA CHEST FINDINGS

Mediastinum: Unremarkable appearance of the thyroid gland. Stable
mild mediastinal adenopathy. Contained aortic rupture with
extravasated intravenous contrast in the periaortic space just
lateral to the esophagus and posterior to tracheal air column. No
significant extension of the mediastinal hematoma.

Heart/Vascular: Interval development of [HOSPITAL] type B aortic
dissection compared to 06/07/2011. The dissection flap arises from
the proximal descending thoracic aorta and extends inferiorly to
just above the aortic hiatus. The dissection does not extend into
the abdominal aortic aneurysm. In addition to the main dissection
flap, there is a focal breech in the media with a relatively large
collection of intramural extravasation of contrast material. The
pool of intramural contrast gives rise to a right intercostal
bronchial trunk. The greatest diameter of the transverse segment of
the aorta is 6.4 cm which is enlarged compared to 3.9 cm previously.
Stable ectasia of the ascending thoracic aorta with a maximal
diameter 4.5 cm. The descending thoracic aorta has slightly enlarged
with the dissection measuring 3.8 cm at the level of the pulmonary
veins compared to 3.5 cm previously. Unchanged aortic caliber at the
hiatus. The aorta remains tortuous.

Mild cardiomegaly with left atrial enlargement. Markedly enlarged
main and central pulmonary arteries consistent with pulmonary
arterial hypertension. The main pulmonary artery measures 4.6 cm in
diameter. Extensive atherosclerotic vascular calcifications in the
coronary arteries. Mitral valve replacement. Mild thickening and
calcification of the aortic valve. No pericardial effusion.

Lungs/Pleura: Interlobular septal thickening and scattered areas of
patchy ground-glass attenuation airspace opacity consistent with
mild pulmonary edema. Dependent atelectasis noted bilaterally.
Chronic bronchial wall thickening suggests chronic bronchitis/ COPD.
There are a few emphysematous blebs.

Bones/Soft Tissues: No acute fracture or aggressive appearing lytic
or blastic osseous lesion. Healed median sternotomy.

Review of the MIP images confirms the above findings.

CTA ABDOMEN AND PELVIS FINDINGS

VASCULAR

Aorta: Below the diaphragm, the aorta demonstrates no evidence of
acute dissection. There is aneurysmal dilatation of the infrarenal
abdominal aorta with a maximal transverse diameter of 2.7 cm.
Scattered atherosclerotic vascular calcifications. No significant
mural or wall adherent thrombus.

Celiac: Atherosclerotic plaque at the origin. The vessel remains
widely patent. Right hepatic artery is replaced to the SMA.

SMA: Widely patent.  Replaced right hepatic artery.

Renals: Single dominant renal arteries bilaterally. Mild to moderate
stenosis of the proximal left renal artery. No changes of
fibromuscular dysplasia.

IMA: Patent and slightly atretic.

Inflow: Atherosclerotic vascular calcifications without aneurysmal
dilatation or significant stenosis.

Proximal Outflow: Mild atherosclerotic calcifications in the common
femoral arteries. The visualized superficial and profunda femoral
arteries are patent.

Veins: New focal venous abnormality identified given the limitations
of non venous phase timing.

NON-VASCULAR

Abdomen: Unremarkable CT appearance of the stomach, duodenum,
spleen, and pancreas save for fatty atrophy. Similar appearance of a
mild adreniform thickening of the adrenal glands bilaterally likely
reflecting adrenal hyperplasia. Normal hepatic contour and
morphology. No discrete hepatic lesion. Gallbladder is surgically
absent. No intra or extrahepatic biliary ductal dilatation. 5.3 cm
exophytic simple cyst from the upper pole of the right kidney. No
hydronephrosis. No enhancing renal mass. 1.2 cm exophytic cyst from
the interpolar left kidney.

Moderate volume of formed retained stool in the rectum consistent
with constipation. Colonic diverticulosis without evidence of active
diverticulitis. Normal appendix in the right lower quadrant. No free
fluid or suspicious adenopathy.

Pelvis: Unremarkable appearance of the postmenopausal uterus and
ovaries. Normal bladder. No free fluid or suspicious adenopathy.

Bones/Soft Tissues: Acute compression fracture of the superior
endplate of T12. There is approximately 30% height loss. 3 mm of
posterior bony retropulsion from the superior endplate. Chronic
bilateral L4 pars defects with mild grade 1 anterolisthesis of L4 on
L5.

Review of the MIP images confirms the above findings.
IMPRESSION: CTA CHEST

1. Compared to 06/07/2011 there is been interval development of an
aneurysmal [HOSPITAL] type B thoracic aortic dissection. The
dissection is complex, in addition to the main true and false lumens
there is a focal breech of the media medially with extravasation of
an irregular pool of contrast material into the aortic wall. This
occurs at the origin of a right intercostal bronchial trunk which
opacifies with contrast material from the irregular intramural
contrast collection. In this region, the aorta has markedly
increased in size currently measuring 6.4 cm compared to 3.9 cm
previously. The dissection flap does not extends into the abdominal
aorta.
2. Cardiomegaly with left atrial enlargement.
3. Mitral valve replacement.
4. Atherosclerosis including coronary artery disease.
5. Marked enlargement of the main and central pulmonary arteries
consistent with pulmonary arterial hypertension.
6. Pulmonary interstitial edema.
7. COPD.
CTA ABD/PELVIS

1. No acute abdominal aortic pathology.
2. Acute T12 compression fracture with approximately 30% height loss
and 3 mm posterior bony retropulsion.
3. Mild fusiform aneurysmal dilatation of the infrarenal abdominal
aorta with a maximal transverse diameter of 2.7 cm.
4. Colonic diverticulosis without evidence of active diverticulitis.
5. Chronic bilateral L4 pars defects with grade 1 anterolisthesis of
L4 on L5.
Critical Value/emergent results were called by telephone at the time
of interpretation on 08/21/2013 at [DATE] to Dr. Photoediting, who
verbally acknowledged these results.

## 2014-08-22 IMAGING — CR DG CHEST 1V PORT
1 series · 1 of 1 positions shown · non-contrast
Comparison: 07/12/2013

CLINICAL DATA: Shortness of breath

EXAM:
PORTABLE CHEST - 1 VIEW

[AP]
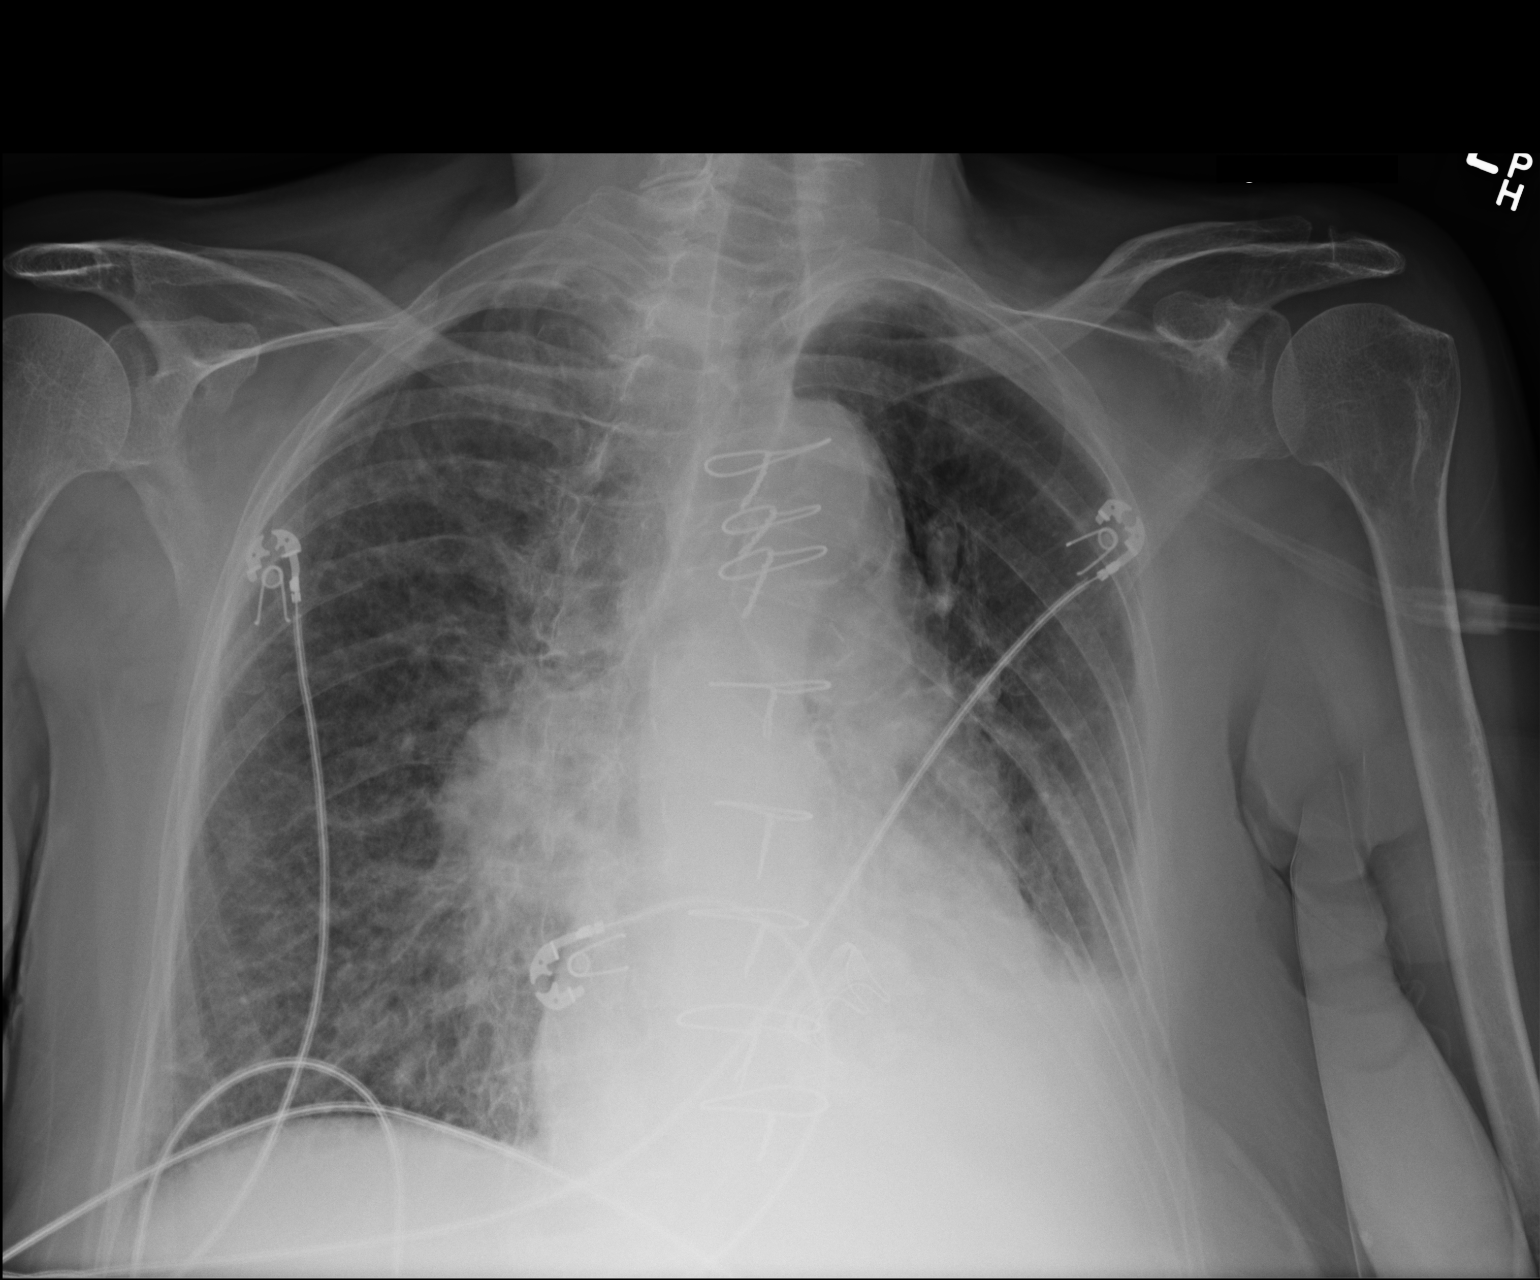

[1 of 1 positions shown; findings below may reference images not displayed]

FINDINGS: Cardiac enlargement with mild pulmonary vascular congestion.
Prominent perihilar opacities are increasing since previous study
and could represent central vascular prominence or lymphadenopathy.
Prominence of the pulmonary arteries was demonstrated previously.
This could indicate pulmonary arterial hypertension with increased
since previous studies. There is diffuse interstitial changes
suggesting edema. Developing small left pleural effusion with
basilar atelectasis.
IMPRESSION: Cardiac enlargement with pulmonary vascular congestion, probable
interstitial edema, and small left pleural effusion. Increasing
perihilar opacities since previous study probably represent
prominent pulmonary arteries but developing lymphadenopathy could
also have this appearance.

## 2014-08-31 ENCOUNTER — Other Ambulatory Visit: Payer: Self-pay | Admitting: Emergency Medicine

## 2014-08-31 DIAGNOSIS — J449 Chronic obstructive pulmonary disease, unspecified: Secondary | ICD-10-CM

## 2014-09-21 ENCOUNTER — Encounter: Payer: Self-pay | Admitting: Internal Medicine

## 2014-09-22 ENCOUNTER — Telehealth: Payer: Self-pay | Admitting: Internal Medicine

## 2014-09-22 NOTE — Telephone Encounter (Signed)
[facility] Patient's daughter was wanting to make sure we [facility] Fax was received and has been scanned into the patient's chart and a copy has been sent to Dr. Tenny Crawoss. Erie NoeVanessa verbalized understanding and would communicate this to patient's daughter.

## 2014-09-22 NOTE — Telephone Encounter (Signed)
New problem   Want to know if labs that was faxed was received. Please call to advise.

## 2014-09-23 ENCOUNTER — Telehealth: Payer: Self-pay | Admitting: *Deleted

## 2014-09-23 NOTE — Telephone Encounter (Signed)
Informed patient.  Verbalizes understanding. 

## 2014-09-23 NOTE — Telephone Encounter (Signed)
-----   Message from Dietrich PatesPaula Ross V, MD sent at 09/23/2014 11:02 AM EDT ----- Potassium is a little low  3.3  I would take additional potassium (20 meq) now then take additonal KCL on top of what she is taking every other day F/U BMET in 10 days to 2 wks

## 2014-09-24 ENCOUNTER — Telehealth: Payer: Self-pay | Admitting: *Deleted

## 2014-09-24 NOTE — Telephone Encounter (Signed)
Notes Recorded by Pricilla RifflePaula Ross V, MD on 09/23/2014 at 11:02 AM Potassium is a little low 3.3 I would take additional potassium (20 meq) now then take additonal KCL on top of what she is taking every other day F/U BMET in 10 days to 2 wks   [facility] to leave a message in regards to the pt needing a medication change and follow-up labs. Will await return phone call.

## 2014-09-24 NOTE — Telephone Encounter (Signed)
Patients daughter, Galen Dafteresa Walker called and stated that a nurse had called and spoke to the patient about changing her potassium dose. Rosey Batheresa stated that the [facility] is in [facility] and this order needs to be called in to the nurse there. Please advise. Thanks, MI

## 2014-09-24 NOTE — Telephone Encounter (Signed)
#   [phone]

## 2014-09-27 NOTE — Telephone Encounter (Signed)
msg left @ [phone], lmtcb to confirm K+ dosage recommendations and lab f/u. Asked for call back and to ask for triage.

## 2014-09-27 NOTE — Telephone Encounter (Signed)
See other phone encounter 09/24/14 for setting up repeat BMET in 10 days to 2 wks.

## 2014-09-30 ENCOUNTER — Telehealth: Payer: Self-pay | Admitting: Internal Medicine

## 2014-09-30 NOTE — Telephone Encounter (Signed)
Faxed order for bmet to be drawn between May 16-19; and for potassium 20 meq 1 tablet twice daily alternating with 1 tablet 3 times daily.   Larrie Kassalled Teresa, patient's daughter and informed. Pt had not started the medication change because [facility] manages her medicines for her.

## 2014-09-30 NOTE — Telephone Encounter (Signed)
New Prob    Requesting clarification on directions for Potassium Chloride medication sent over. Please call.

## 2014-09-30 NOTE — Telephone Encounter (Signed)
Pt's daughter Rosey Batheresa called needs Potassium dosage change and redraw lab orders written out and [facility] [phone], as they have not received written orders and the pt has not yet received any additional K+ as ordered by Dr Tenny Crawoss.  Please call pt's daughter back once fax sent at [phone].  Thanks

## 2014-09-30 NOTE — Telephone Encounter (Signed)
[facility] about potassium doseage & repeat lab work. They need an order faxed to them at [phone] Mylo Redebbie Quanta Roher RN

## 2014-09-30 NOTE — Telephone Encounter (Signed)
Clarified potassium-take 2 tabs one day, 3 tabs the next day. Patricia [name] verbalizes understanding and states pt will have blood drawn on Tuesday.

## 2014-10-05 ENCOUNTER — Encounter: Payer: Self-pay | Admitting: Internal Medicine

## 2014-10-29 ENCOUNTER — Telehealth: Payer: Self-pay | Admitting: Internal Medicine

## 2014-10-29 NOTE — Telephone Encounter (Signed)
Patricia [name] from [facility] is calling wanting to know if Dr. Tenny Craw wants them to draw CBC, BMET or to have the facility-[facility]- draw labs.  States they were getting labs about every 6 wks but Patricia [name] refused today to have labs done.  Advised will forward to Dr. Tenny Craw for her recommendations as to who should draw labs and specifically if CBC and BMET is labs to be drawn.  Patricia [name] asked that we call her back at [facility] at [phone].

## 2014-10-29 NOTE — Telephone Encounter (Signed)
New message      Pt had BNP last of may.  They usually do a CBC and BNP every 6wks.  When should she have another CBC and BNP drawn?

## 2014-11-02 NOTE — Telephone Encounter (Signed)
Left message on Judy's voicemail at [facility] for labs in August. Faxed order for labs to [facility] [phone], with directions for results to be faxed to Dr. Tenny Craw.

## 2014-11-02 NOTE — Telephone Encounter (Signed)
Labs in May were OK Would recomm CBC and BMET and TSH in AUgust

## 2014-11-05 ENCOUNTER — Inpatient Hospital Stay (HOSPITAL_COMMUNITY)
Admission: EM | Admit: 2014-11-05 | Discharge: 2014-11-08 | DRG: 866 | Disposition: A | Discharge status: Home / Self Care | Payer: Medicare Other | Attending: Internal Medicine | Admitting: Internal Medicine

## 2014-11-05 ENCOUNTER — Emergency Department (HOSPITAL_COMMUNITY): Payer: Medicare Other

## 2014-11-05 ENCOUNTER — Encounter (HOSPITAL_COMMUNITY): Payer: Self-pay | Admitting: *Deleted

## 2014-11-05 ENCOUNTER — Telehealth: Payer: Self-pay | Admitting: Internal Medicine

## 2014-11-05 DIAGNOSIS — D649 Anemia, unspecified: Secondary | ICD-10-CM | POA: Diagnosis present

## 2014-11-05 DIAGNOSIS — M542 Cervicalgia: Secondary | ICD-10-CM | POA: Diagnosis not present

## 2014-11-05 DIAGNOSIS — I482 Chronic atrial fibrillation, unspecified: Secondary | ICD-10-CM | POA: Diagnosis present

## 2014-11-05 DIAGNOSIS — Z66 Do not resuscitate: Secondary | ICD-10-CM | POA: Diagnosis present

## 2014-11-05 DIAGNOSIS — E039 Hypothyroidism, unspecified: Secondary | ICD-10-CM | POA: Diagnosis present

## 2014-11-05 DIAGNOSIS — I10 Essential (primary) hypertension: Secondary | ICD-10-CM | POA: Diagnosis present

## 2014-11-05 DIAGNOSIS — R1111 Vomiting without nausea: Secondary | ICD-10-CM | POA: Diagnosis present

## 2014-11-05 DIAGNOSIS — Z79899 Other long term (current) drug therapy: Secondary | ICD-10-CM

## 2014-11-05 DIAGNOSIS — I080 Rheumatic disorders of both mitral and aortic valves: Secondary | ICD-10-CM | POA: Diagnosis present

## 2014-11-05 DIAGNOSIS — I7101 Dissection of thoracic aorta: Secondary | ICD-10-CM | POA: Diagnosis present

## 2014-11-05 DIAGNOSIS — Z952 Presence of prosthetic heart valve: Secondary | ICD-10-CM

## 2014-11-05 DIAGNOSIS — R1110 Vomiting, unspecified: Secondary | ICD-10-CM | POA: Diagnosis present

## 2014-11-05 DIAGNOSIS — J9611 Chronic respiratory failure with hypoxia: Secondary | ICD-10-CM | POA: Diagnosis not present

## 2014-11-05 DIAGNOSIS — J9610 Chronic respiratory failure, unspecified whether with hypoxia or hypercapnia: Secondary | ICD-10-CM

## 2014-11-05 DIAGNOSIS — Z953 Presence of xenogenic heart valve: Secondary | ICD-10-CM

## 2014-11-05 DIAGNOSIS — I272 Other secondary pulmonary hypertension: Secondary | ICD-10-CM | POA: Diagnosis present

## 2014-11-05 DIAGNOSIS — I5032 Chronic diastolic (congestive) heart failure: Secondary | ICD-10-CM

## 2014-11-05 DIAGNOSIS — Z91041 Radiographic dye allergy status: Secondary | ICD-10-CM

## 2014-11-05 DIAGNOSIS — Z87891 Personal history of nicotine dependence: Secondary | ICD-10-CM

## 2014-11-05 DIAGNOSIS — I712 Thoracic aortic aneurysm, without rupture: Secondary | ICD-10-CM | POA: Diagnosis present

## 2014-11-05 DIAGNOSIS — G43A1 Cyclical vomiting, intractable: Secondary | ICD-10-CM | POA: Diagnosis not present

## 2014-11-05 DIAGNOSIS — I421 Obstructive hypertrophic cardiomyopathy: Secondary | ICD-10-CM | POA: Diagnosis present

## 2014-11-05 DIAGNOSIS — Z7901 Long term (current) use of anticoagulants: Secondary | ICD-10-CM | POA: Diagnosis not present

## 2014-11-05 DIAGNOSIS — Z881 Allergy status to other antibiotic agents status: Secondary | ICD-10-CM | POA: Diagnosis not present

## 2014-11-05 DIAGNOSIS — G4730 Sleep apnea, unspecified: Secondary | ICD-10-CM | POA: Diagnosis present

## 2014-11-05 DIAGNOSIS — J449 Chronic obstructive pulmonary disease, unspecified: Secondary | ICD-10-CM | POA: Diagnosis not present

## 2014-11-05 DIAGNOSIS — Z888 Allergy status to other drugs, medicaments and biological substances status: Secondary | ICD-10-CM | POA: Diagnosis not present

## 2014-11-05 DIAGNOSIS — IMO0002 Reserved for concepts with insufficient information to code with codable children: Secondary | ICD-10-CM | POA: Diagnosis present

## 2014-11-05 DIAGNOSIS — E785 Hyperlipidemia, unspecified: Secondary | ICD-10-CM | POA: Diagnosis present

## 2014-11-05 DIAGNOSIS — I71012 Dissection of descending thoracic aorta: Secondary | ICD-10-CM | POA: Diagnosis present

## 2014-11-05 DIAGNOSIS — Z9981 Dependence on supplemental oxygen: Secondary | ICD-10-CM

## 2014-11-05 DIAGNOSIS — B349 Viral infection, unspecified: Secondary | ICD-10-CM | POA: Diagnosis not present

## 2014-11-05 DIAGNOSIS — R69 Illness, unspecified: Secondary | ICD-10-CM

## 2014-11-05 LAB — URINE MICROSCOPIC-ADD ON

## 2014-11-05 LAB — LIPASE, BLOOD: Lipase: 12 U/L — ABNORMAL LOW (ref 22–51)

## 2014-11-05 LAB — URINALYSIS, ROUTINE W REFLEX MICROSCOPIC
BILIRUBIN URINE: NEGATIVE
Glucose, UA: 100 mg/dL — AB
KETONES UR: NEGATIVE mg/dL
NITRITE: NEGATIVE
Protein, ur: 30 mg/dL — AB
Specific Gravity, Urine: 1.02 (ref 1.005–1.030)
UROBILINOGEN UA: 1 mg/dL (ref 0.0–1.0)
pH: 6 (ref 5.0–8.0)

## 2014-11-05 LAB — CBC WITH DIFFERENTIAL/PLATELET
Basophils Absolute: 0 10*3/uL (ref 0.0–0.1)
Basophils Relative: 0 % (ref 0–1)
EOS ABS: 0 10*3/uL (ref 0.0–0.7)
Eosinophils Relative: 0 % (ref 0–5)
HCT: 43.1 % (ref 36.0–46.0)
Hemoglobin: 13.8 g/dL (ref 12.0–15.0)
LYMPHS PCT: 3 % — AB (ref 12–46)
Lymphs Abs: 0.3 10*3/uL — ABNORMAL LOW (ref 0.7–4.0)
MCH: 31 pg (ref 26.0–34.0)
MCHC: 32 g/dL (ref 30.0–36.0)
MCV: 96.9 fL (ref 78.0–100.0)
Monocytes Absolute: 0.2 10*3/uL (ref 0.1–1.0)
Monocytes Relative: 2 % — ABNORMAL LOW (ref 3–12)
NEUTROS PCT: 95 % — AB (ref 43–77)
Neutro Abs: 10.8 10*3/uL — ABNORMAL HIGH (ref 1.7–7.7)
PLATELETS: 92 10*3/uL — AB (ref 150–400)
RBC: 4.45 MIL/uL (ref 3.87–5.11)
RDW: 14.5 % (ref 11.5–15.5)
WBC: 11.3 10*3/uL — ABNORMAL HIGH (ref 4.0–10.5)

## 2014-11-05 LAB — COMPREHENSIVE METABOLIC PANEL
ALBUMIN: 4.2 g/dL (ref 3.5–5.0)
ALT: 25 U/L (ref 14–54)
ANION GAP: 11 (ref 5–15)
AST: 41 U/L (ref 15–41)
Alkaline Phosphatase: 89 U/L (ref 38–126)
BILIRUBIN TOTAL: 1.6 mg/dL — AB (ref 0.3–1.2)
BUN: 27 mg/dL — AB (ref 6–20)
CO2: 25 mmol/L (ref 22–32)
Calcium: 9.3 mg/dL (ref 8.9–10.3)
Chloride: 105 mmol/L (ref 101–111)
Creatinine, Ser: 0.95 mg/dL (ref 0.44–1.00)
GFR calc non Af Amer: 53 mL/min — ABNORMAL LOW (ref >60)
Glucose, Bld: 167 mg/dL — ABNORMAL HIGH (ref 65–99)
Potassium: 4.3 mmol/L (ref 3.5–5.1)
Sodium: 141 mmol/L (ref 135–145)
TOTAL PROTEIN: 7.6 g/dL (ref 6.5–8.1)

## 2014-11-05 LAB — CREATININE, SERUM
CREATININE: 0.88 mg/dL (ref 0.44–1.00)
GFR, EST NON AFRICAN AMERICAN: 58 mL/min — AB (ref >60)

## 2014-11-05 LAB — TROPONIN I
TROPONIN I: 0.03 ng/mL (ref <0.031)
Troponin I: 0.03 ng/mL (ref <0.031)

## 2014-11-05 LAB — CBC
HEMATOCRIT: 39.3 % (ref 36.0–46.0)
Hemoglobin: 12.6 g/dL (ref 12.0–15.0)
MCH: 31.3 pg (ref 26.0–34.0)
MCHC: 32.1 g/dL (ref 30.0–36.0)
MCV: 97.5 fL (ref 78.0–100.0)
PLATELETS: 95 10*3/uL — AB (ref 150–400)
RBC: 4.03 MIL/uL (ref 3.87–5.11)
RDW: 14.6 % (ref 11.5–15.5)
WBC: 9.5 10*3/uL (ref 4.0–10.5)

## 2014-11-05 LAB — I-STAT CG4 LACTIC ACID, ED: LACTIC ACID, VENOUS: 2.9 mmol/L — AB (ref 0.5–2.0)

## 2014-11-05 LAB — PROTIME-INR
INR: 1.13 (ref 0.00–1.49)
PROTHROMBIN TIME: 14.6 s (ref 11.6–15.2)

## 2014-11-05 LAB — BRAIN NATRIURETIC PEPTIDE: B Natriuretic Peptide: 1327.6 pg/mL — ABNORMAL HIGH (ref 0.0–100.0)

## 2014-11-05 MED ORDER — GUAIFENESIN-DM 100-10 MG/5ML PO SYRP: 5.0000 mL | ORAL_SOLUTION | ORAL | Status: DC | PRN

## 2014-11-05 MED ORDER — SODIUM CHLORIDE 0.9 % IJ SOLN
3.0000 mL | Freq: Two times a day (BID) | INTRAMUSCULAR | Status: DC
  Administered 2014-11-05 – 2014-11-07 (×3): 3 mL via INTRAVENOUS

## 2014-11-05 MED ORDER — PROMETHAZINE HCL 25 MG/ML IJ SOLN
6.2500 mg | Freq: Four times a day (QID) | INTRAMUSCULAR | Status: DC | PRN
  Administered 2014-11-06: 6.25 mg via INTRAVENOUS
  Filled 2014-11-05: qty 1

## 2014-11-05 MED ORDER — SODIUM CHLORIDE 0.9 % IV SOLN
INTRAVENOUS | Status: DC
  Administered 2014-11-05: 17:00:00 via INTRAVENOUS

## 2014-11-05 MED ORDER — ONDANSETRON HCL 4 MG/2ML IJ SOLN
4.0000 mg | Freq: Four times a day (QID) | INTRAMUSCULAR | Status: DC | PRN
  Administered 2014-11-05: 4 mg via INTRAVENOUS
  Filled 2014-11-05 (×2): qty 2

## 2014-11-05 MED ORDER — ONDANSETRON HCL 4 MG PO TABS: 4.0000 mg | ORAL_TABLET | Freq: Four times a day (QID) | ORAL | Status: DC | PRN

## 2014-11-05 MED ORDER — ALBUTEROL SULFATE (2.5 MG/3ML) 0.083% IN NEBU: 2.5000 mg | INHALATION_SOLUTION | RESPIRATORY_TRACT | Status: DC | PRN

## 2014-11-05 MED ORDER — ENOXAPARIN SODIUM 40 MG/0.4ML ~~LOC~~ SOLN
40.0000 mg | SUBCUTANEOUS | Status: DC
  Filled 2014-11-05: qty 0.4

## 2014-11-05 MED ORDER — SODIUM CHLORIDE 0.9 % IV SOLN
INTRAVENOUS | Status: DC
  Administered 2014-11-05: 21:00:00 via INTRAVENOUS

## 2014-11-05 MED ORDER — NITROGLYCERIN 0.4 MG SL SUBL: 0.4000 mg | SUBLINGUAL_TABLET | SUBLINGUAL | Status: DC | PRN

## 2014-11-05 MED ORDER — METOPROLOL TARTRATE 25 MG PO TABS
25.0000 mg | ORAL_TABLET | Freq: Two times a day (BID) | ORAL | Status: DC
  Administered 2014-11-06 – 2014-11-08 (×5): 25 mg via ORAL
  Filled 2014-11-05 (×6): qty 1

## 2014-11-05 MED ORDER — SERTRALINE HCL 100 MG PO TABS
100.0000 mg | ORAL_TABLET | Freq: Every day | ORAL | Status: DC
  Administered 2014-11-06 – 2014-11-08 (×3): 100 mg via ORAL
  Filled 2014-11-05 (×3): qty 1

## 2014-11-05 MED ORDER — LEVOTHYROXINE SODIUM 25 MCG PO TABS
62.5000 ug | ORAL_TABLET | Freq: Every day | ORAL | Status: DC
  Administered 2014-11-06 – 2014-11-08 (×3): 62.5 ug via ORAL
  Filled 2014-11-05: qty 2.5
  Filled 2014-11-05: qty 3

## 2014-11-05 MED ORDER — SODIUM CHLORIDE 0.9 % IV SOLN: INTRAVENOUS | Status: DC

## 2014-11-05 MED ORDER — VITAMIN D 1000 UNITS PO TABS
1000.0000 [IU] | ORAL_TABLET | Freq: Every day | ORAL | Status: DC
  Administered 2014-11-06 – 2014-11-07 (×3): 1000 [IU] via ORAL
  Filled 2014-11-05 (×3): qty 1

## 2014-11-05 MED ORDER — ONDANSETRON HCL 4 MG/2ML IJ SOLN
4.0000 mg | Freq: Once | INTRAMUSCULAR | Status: AC
  Administered 2014-11-05: 4 mg via INTRAVENOUS
  Filled 2014-11-05: qty 2

## 2014-11-05 MED ORDER — AMIODARONE HCL 100 MG PO TABS
100.0000 mg | ORAL_TABLET | Freq: Every day | ORAL | Status: DC
  Administered 2014-11-06 – 2014-11-08 (×3): 100 mg via ORAL
  Filled 2014-11-05 (×3): qty 1

## 2014-11-05 MED ORDER — ACETAMINOPHEN 325 MG PO TABS
650.0000 mg | ORAL_TABLET | Freq: Four times a day (QID) | ORAL | Status: DC | PRN
  Administered 2014-11-06 (×2): 650 mg via ORAL
  Filled 2014-11-05 (×2): qty 2

## 2014-11-05 MED ORDER — SODIUM CHLORIDE 0.9 % IV BOLUS (SEPSIS)
500.0000 mL | Freq: Once | INTRAVENOUS | Status: AC
  Administered 2014-11-05: 500 mL via INTRAVENOUS

## 2014-11-05 MED ORDER — ACETAMINOPHEN 650 MG RE SUPP: 650.0000 mg | Freq: Four times a day (QID) | RECTAL | Status: DC | PRN

## 2014-11-05 NOTE — ED Notes (Signed)
Pt nauseated 

## 2014-11-05 NOTE — ED Notes (Signed)
Per EMS pt coming from [facility] assisted living with c/o abdominal pain, nausea and vomiting starting this morning. Pt given 4 mg zofran IV en route which relieved her symptoms.

## 2014-11-05 NOTE — ED Notes (Signed)
Spoke to Lexicographer and the 20 min timer , pt can go to floor 17:38

## 2014-11-05 NOTE — Telephone Encounter (Signed)
Zofran for nausea. She has refused blood work   With GI symtoms I would encourage her to get BMET

## 2014-11-05 NOTE — ED Notes (Signed)
Pt actively vomiting. Zofran ordered.

## 2014-11-05 NOTE — Telephone Encounter (Signed)
I spoke with Patricia [name] in Hospice care & she will try to encourage pt to allow blood work for bmet. Hospice MD has already ordered Zofran  Mylo Red RN

## 2014-11-05 NOTE — H&P (Signed)
PATIENT DETAILS Name: Patricia [name] Age: 81 y.o. Sex: female Date of Birth: [birth year] Admit Date: 11/05/2014 [id number] DAVIDSON, MD Referring Physician:Dr Pfeiffer   CHIEF COMPLAINT:  Vomiting since this morning  HPI: Patricia [name] is a 81 y.o. female with a Past Medical History of COPD with chronic respiratory failure on 5 L of oxygen at home, pulmonary hypertension, atrial fibrillation not on anticoagulation  who presents today with the above noted complaint. Per patient and family (daughter is at bedside) patient was just not feeling well yesterday but did not have any nausea,vomiting or diarrhea. Around 6:30 this morning, patient started having profuse nausea with vomiting. Per family patient has a around 10 episodes of vomiting. Patient had 1 loose bowel movement this morning but none since then. Patient denies any abdominal pain or fever. Patient denies any dysuria. Patient denies any chest pain or shortness of breath. Patient was subsequent day brought to the emergency room for further evaluation, where CT scan of the abdomen without contrast did not reveal any acute abnormalities. I was subsequently asked to admit this patient for further evaluation and treatment.   ALLERGIES:   Allergies  Allergen Reactions  . Ivp Dye [Iodinated Diagnostic Agents] Itching  . Nitrofurantoin Itching  . Ramipril Other (See Comments)    unknown    PAST MEDICAL HISTORY: Past Medical History  Diagnosis Date  . CAD (coronary artery disease)   . Mitral valve disorder     a. s/p MVR;  b. 06/2013 Echo: EF 55-60%, no rwma, Ao sclerosis, mild AI, mild MR, miod dil LA, sev dil RV with reduced fxn, sev dil RA, mod TR/PR, PASP (study reviewed by Dr. Verlin Fester- MS felt to be sev, peak grad 39/mean 13).  [name] HTN (hypertension)   . Dyslipidemia   . Aortic stenosis, mild   . Breast cyst   . Hypertrophic cardiomyopathy   . Sleep apnea   . COPD (chronic obstructive pulmonary disease)      "a touch of"; prn o2 at home with activity   . Atrial fibrillation     a. chronic coumadin.  [name] Hypothyroidism   . Anemia   . Chronic right-sided heart failure     PAST SURGICAL HISTORY: Past Surgical History  Procedure Laterality Date  . Laparoscopic cholecystectomy  2009  . Cataract extraction  2003    bilateral  . Mitral valve replacement  2007  . Neck mass excision  2003    benign  . Colonoscopy    . Broken wrist      left, metal implanted   . Broken ankle      right, metal implanted   . Cardiac catheterization  06/2011  . Breast surgery      bilateral cysts x3 removed, benign   . Tee without cardioversion  01/31/2012    Procedure: TRANSESOPHAGEAL ECHOCARDIOGRAM (TEE);  Surgeon: Pricilla Riffle, MD;  Location: [facility] ENDOSCOPY;  Service: Cardiovascular;  Laterality: N/A;  . Cardioversion N/A 01/16/2013    Procedure: CARDIOVERSION;  Surgeon: Vesta Mixer, MD;  Location: [facility] ENDOSCOPY;  Service: Cardiovascular;  Laterality: N/A;    MEDICATIONS AT HOME: Prior to Admission medications   Medication Sig Start Date End Date Taking? Authorizing Provider  amiodarone (PACERONE) 200 MG tablet Take 0.5 tablets (100 mg total) by mouth daily. 07/08/14  Yes Pricilla Riffle, MD  Calcium Carb-Cholecalciferol (CALCIUM + D3) 600-200 MG-UNIT TABS Take 1 tablet by mouth 2 (two) times daily.  Yes Historical Provider, MD  cholecalciferol (VITAMIN D) 1000 UNITS tablet Take 1,000 Units by mouth at bedtime.    Yes Historical Provider, MD  furosemide (LASIX) 20 MG tablet TAKE ONE TABLET TWICE DAILY 05/03/14  Yes Pricilla Riffle, MD  levothyroxine (SYNTHROID, LEVOTHROID) 125 MCG tablet Take 62.5 mcg by mouth daily before breakfast.   Yes Historical Provider, MD  metoprolol tartrate (LOPRESSOR) 25 MG tablet Take 1 tablet (25 mg total) by mouth 2 (two) times daily. 09/21/13  Yes Pricilla Riffle, MD  nitroGLYCERIN (NITROSTAT) 0.4 MG SL tablet Place 0.4 mg under the tongue every 5 (five) minutes as needed for chest  pain.   Yes Historical Provider, MD  potassium chloride SA (K-DUR,KLOR-CON) 20 MEQ tablet TAKE ONE TABLET TWICE DAILY Patient taking differently: TAKE 20 MEQ BY MOUTH THREE TIMES DAILY EVERY OTHER DAY. 05/03/14  Yes Pricilla Riffle, MD  Psyllium 48.57 % POWD Take 3.4 g by mouth daily.   Yes Historical Provider, MD  sertraline (ZOLOFT) 100 MG tablet Take 100 mg by mouth daily.   Yes Historical Provider, MD  calcitonin, salmon, (MIACALCIN/FORTICAL) 200 UNIT/ACT nasal spray ONE SPRAY IN ONE NOSTRIL DAILY ALTERNATE NOSTRILS DAILY Patient not taking: Reported on 11/05/2014 06/10/14   Pricilla Riffle, MD  LORazepam (ATIVAN) 0.5 MG tablet Take 1-2 tablets (0.5-1 mg total) by mouth every 2 (two) hours as needed for anxiety or seizure. Patient not taking: Reported on 11/05/2014 03/07/14   Blake Divine, MD    FAMILY HISTORY: Family History  Problem Relation Age of Onset  . Diabetes Mother   . CVA Mother   . Other Father   . Diabetes Brother   . Cancer Brother     throat ca, smoker  . Aneurysm Brother     brain aneurysm  . Breast cancer Sister   . Emphysema Sister     smoker   SOCIAL HISTORY:  reports that she quit smoking about 24 years ago. Her smoking use included Cigarettes. She has a 40 pack-year smoking history. She has never used smokeless tobacco. She reports that she does not drink alcohol or use illicit drugs. Lives at: /ALF Mobility: [facility]  REVIEW OF SYSTEMS:  Constitutional:   No  weight loss, night sweats,  Fevers, chills, fatigue.  HEENT:    No headaches, Dysphagia,Tooth/dental problems,Sore throat,  No sneezing, itching, ear ache, nasal congestion, post nasal drip  Cardio-vascular: No chest pain,Orthopnea, PND,lower extremity edema, anasarca, palpitations  GI:  No heartburn, indigestion, abdominal pain,  melena or hematochezia  Resp: No  hemoptysis,plueritic chest pain.   Skin:  No rash or lesions.  GU:  No dysuria, change in color of urine, no urgency or  frequency.  No flank pain.  Musculoskeletal: No joint pain or swelling.  No decreased range of motion.  No back pain.  Endocrine: No heat intolerance, no cold intolerance, no polyuria, no polydipsia  Psych: No change in mood or affect. No depression or anxiety.  No memory loss.   PHYSICAL EXAM: Blood pressure 140/66, pulse 78, temperature 98.3 F (36.8 C), temperature source Oral, resp. rate 18, SpO2 94 %.  General appearance :Awake, alert-in mild distress due to nausea. HEENT: Atraumatic and Normocephalic, pupils equally reactive to light and accomodation Neck: supple, no JVD. No cervical lymphadenopathy.  Chest:Good air entry bilaterally, no added sounds  CVS: S1 S2 regular, no murmurs.  Abdomen: Bowel sounds present, Non tender and not distended with no gaurding, rigidity or rebound. Extremities: B/L Lower Ext shows no  edema, both legs are warm to touch Neurology:  Non focal-but with gen weakness Skin:No Rash Wounds:N/A  LABS ON ADMISSION:   Recent Labs  11/05/14 1418  NA 141  K 4.3  CL 105  CO2 25  GLUCOSE 167*  BUN 27*  CREATININE 0.95  CALCIUM 9.3    Recent Labs  11/05/14 1418  AST 41  ALT 25  ALKPHOS 89  BILITOT 1.6*  PROT 7.6  ALBUMIN 4.2    Recent Labs  11/05/14 1418  LIPASE 12*    Recent Labs  11/05/14 1418  WBC 11.3*  NEUTROABS 10.8*  HGB 13.8  HCT 43.1  MCV 96.9  PLT 92*    Recent Labs  11/05/14 1418  TROPONINI 0.03   No results for input(s): DDIMER in the last 72 hours. Invalid input(s): POCBNP   RADIOLOGIC STUDIES ON ADMISSION: Ct Abdomen Pelvis Wo Contrast  11/05/2014   CLINICAL DATA:  Unspecified abdominal pain, nausea and vomiting.  EXAM: CT ABDOMEN AND PELVIS WITHOUT CONTRAST  TECHNIQUE: Multidetector CT imaging of the abdomen and pelvis was performed following the standard protocol without IV contrast.  COMPARISON:  08/21/2013  FINDINGS: Lower chest: Thickening of the secondary pulmonary lobular septa at the lung  bases with interstitial accentuation, cardiomegaly, dense and prominent mitral valve calcification, and coronary artery atherosclerotic calcification. Ectatic descending thoracic aorta. Trace bilateral pleural effusions.  Hepatobiliary: Cholecystectomy. Minimal intrahepatic biliary dilatation.  Pancreas: Unremarkable  Spleen: Unremarkable  Adrenals/Urinary Tract: Stable fullness of both adrenal glands. Right kidney upper pole cyst, 5.5 cm diameter, fluid density. 2 mm right kidney lower pole nonobstructive calculus. Exaggerated anterior axis of the right kidney. Several tiny nonobstructive left renal calculi are present. No hydronephrosis or hydroureter.  Stomach/Bowel: Sigmoid diverticulosis. Appendix normal. No dilated bowel.  Vascular/Lymphatic: Infrarenal abdominal aortic ectasia, 2.7 by 2.9 cm, image 39 series 2. Aortoiliac atherosclerotic vascular disease.  Reproductive: Parametrial vascular calcification. Otherwise negative.  Other: No supplemental non-categorized findings.  Musculoskeletal: T12 compression fracture, currently 50% loss of vertebral height, previously 35% loss of vertebral body height. 8 mm posterior bony retropulsion, increased from prior. There is also 5 mm retrolisthesis at T12-L1.  6 mm anterolisthesis at L4-5. Previously suspected pars defects at L4 are poorly characterized/seen on today's exam, without visualized pars discontinuity, and the subluxation might be degenerative.  IMPRESSION: 1. A specific cause for the patient's nausea and abdominal pain is not identified. 2. Trace bilateral pleural effusions with interstitial opacity in the lung bases suggesting interstitial pulmonary edema. Consider chest radiography. 3. Dense mitral valve calcification.  Coronary atherosclerosis. 4. Small bilateral nonobstructive renal calculi. 5. Sigmoid diverticulosis. 6. Infrarenal abdominal aortic ectasia. 7. Increase in the degree of compression of the T12 vertebral body compared to 08/21/13, currently  50% loss of height and 8 mm posterior bony retropulsion. This bony retropulsion causes moderate central narrowing of the thecal sac. 8. I suspect that the 6 mm anterolisthesis at L4-5 is actually degenerative rather than due to pars defects at L4. Today I do not observe definite pars defects.   Electronically Signed   By: Gaylyn Rong M.D.   On: 11/05/2014 15:41   EKG: Personally reviewed. Afib  ASSESSMENT AND PLAN: Present on Admission:  . Vomiting without nausea, intractable:? Secondary to viral syndrome. Her abdomen exam is benign-denies abdominal pain, lipase within normal limits, CT abdomen without any obstruction or acute abnormalities. Will admit to telemetry, supportive care with IV fluids and antiemetics. Keep nothing by mouth. Follow clinical course.   . Chronic  atrial fibrillation: Monitor in telemetry. Continue with the metoprolol if able to take medications. Reviewed prior cardiology outpatient notes, not a anticoagulation candidate.   . Chronic respiratory failure: On 5 L of oxygen at home-continue   . Chronic diastolic heart failure: Clinically compensated-actually on the dry side-cautiously monitor while on IV fluids   . Hypertrophic obstructive cardiomyopathy: Continue beta blocker. Cautiously hydrate-we will need some amount of preload support   . Pulmonary hypertension, secondary: Probably secondary to COPD and CHF.  [name] COPD: Lungs currently clear. Continue with as needed bronchodilators   Further plan will depend as patient's clinical course evolves and further radiologic and laboratory data become available. Patient will be monitored closely.  Above noted plan was discussed with patient/ face to face at bedside, they were in agreement.   CONSULTS: None  DVT Prophylaxis: Prophylactic Lovenox  Code Status: DNR-reconfirmed with family  Disposition Plan:  Discharge back to SNF possibly in 1-2 days,but may warrant SNF depending on clinical course   Total time  spent  55 minutes.Greater than 50% of this time was spent in counseling, explanation of diagnosis, planning of further management, and coordination of care.  [facility] Pager [phone]  If 7PM-7AM, please contact night-coverage [email or web address] Password [facility] 11/05/2014, 4:57 PM

## 2014-11-05 NOTE — ED Provider Notes (Signed)
CSN: [id number]     Arrival date & time 11/05/14  1221 History   First MD Initiated Contact with Patient 11/05/14 1252     Chief Complaint  Patient presents with  . Abdominal Pain  . Nausea  . Emesis     (Consider location/radiation/quality/duration/timing/severity/associated sxs/prior Treatment) HPI Patient awoke this morning with nausea and vomiting. She went to sleep yesterday feeling fairly well. She endorses some mild abdominal discomfort in the upper and mid abdomen. She denies that there is any intense pain. She reports she just feels extremely fatigued and weak and nauseated. She reports one episode of loose stool earlier. Had recurrent diarrhea episodes. She and her husband had eaten the same food yesterday evening bus her daughter doubts that his food poisoning. She has not had a fever. She has had multiple episodes of green vomitus. As a baseline the patient is alert and interactive with good energy level. Past Medical History  Diagnosis Date  . CAD (coronary artery disease)   . Mitral valve disorder     a. s/p MVR;  b. 06/2013 Echo: EF 55-60%, no rwma, Ao sclerosis, mild AI, mild MR, miod dil LA, sev dil RV with reduced fxn, sev dil RA, mod TR/PR, PASP (study reviewed by Dr. Verlin Fester- MS felt to be sev, peak grad 39/mean 13).  [name] HTN (hypertension)   . Dyslipidemia   . Aortic stenosis, mild   . Breast cyst   . Hypertrophic cardiomyopathy   . Sleep apnea   . COPD (chronic obstructive pulmonary disease)     "a touch of"; prn o2 at home with activity   . Atrial fibrillation     a. chronic coumadin.  [name] Hypothyroidism   . Anemia   . Chronic right-sided heart failure    Past Surgical History  Procedure Laterality Date  . Laparoscopic cholecystectomy  2009  . Cataract extraction  2003    bilateral  . Mitral valve replacement  2007  . Neck mass excision  2003    benign  . Colonoscopy    . Broken wrist      left, metal implanted   . Broken ankle      right, metal  implanted   . Cardiac catheterization  06/2011  . Breast surgery      bilateral cysts x3 removed, benign   . Tee without cardioversion  01/31/2012    Procedure: TRANSESOPHAGEAL ECHOCARDIOGRAM (TEE);  Surgeon: Pricilla Riffle, MD;  Location: [facility] ENDOSCOPY;  Service: Cardiovascular;  Laterality: N/A;  . Cardioversion N/A 01/16/2013    Procedure: CARDIOVERSION;  Surgeon: Vesta Mixer, MD;  Location: [facility] ENDOSCOPY;  Service: Cardiovascular;  Laterality: N/A;   Family History  Problem Relation Age of Onset  . Diabetes Mother   . CVA Mother   . Other Father   . Diabetes Brother   . Cancer Brother     throat ca, smoker  . Aneurysm Brother     brain aneurysm  . Breast cancer Sister   . Emphysema Sister     smoker   History  Substance Use Topics  . Smoking status: Former Smoker -- 1.00 packs/day for 40 years    Types: Cigarettes    Quit date: 05/21/1990  . Smokeless tobacco: Never Used  . Alcohol Use: No   OB History    No data available     Review of Systems 10 Systems reviewed and are negative for acute change except as noted in the HPI.    Allergies  Ivp dye; Nitrofurantoin; and Ramipril  Home Medications   Prior to Admission medications   Medication Sig Start Date End Date Taking? Authorizing Provider  amiodarone (PACERONE) 200 MG tablet Take 0.5 tablets (100 mg total) by mouth daily. 07/08/14  Yes Pricilla Riffle, MD  Calcium Carb-Cholecalciferol (CALCIUM + D3) 600-200 MG-UNIT TABS Take 1 tablet by mouth 2 (two) times daily.   Yes Historical Provider, MD  cholecalciferol (VITAMIN D) 1000 UNITS tablet Take 1,000 Units by mouth at bedtime.    Yes Historical Provider, MD  furosemide (LASIX) 20 MG tablet TAKE ONE TABLET TWICE DAILY 05/03/14  Yes Pricilla Riffle, MD  levothyroxine (SYNTHROID, LEVOTHROID) 125 MCG tablet Take 62.5 mcg by mouth daily before breakfast.   Yes Historical Provider, MD  metoprolol tartrate (LOPRESSOR) 25 MG tablet Take 1 tablet (25 mg total) by mouth 2 (two)  times daily. 09/21/13  Yes Pricilla Riffle, MD  nitroGLYCERIN (NITROSTAT) 0.4 MG SL tablet Place 0.4 mg under the tongue every 5 (five) minutes as needed for chest pain.   Yes Historical Provider, MD  potassium chloride SA (K-DUR,KLOR-CON) 20 MEQ tablet TAKE ONE TABLET TWICE DAILY Patient taking differently: TAKE 20 MEQ BY MOUTH THREE TIMES DAILY EVERY OTHER DAY. 05/03/14  Yes Pricilla Riffle, MD  Psyllium 48.57 % POWD Take 3.4 g by mouth daily.   Yes Historical Provider, MD  sertraline (ZOLOFT) 100 MG tablet Take 100 mg by mouth daily.   Yes Historical Provider, MD  calcitonin, salmon, (MIACALCIN/FORTICAL) 200 UNIT/ACT nasal spray ONE SPRAY IN ONE NOSTRIL DAILY ALTERNATE NOSTRILS DAILY Patient not taking: Reported on 11/05/2014 06/10/14   Pricilla Riffle, MD  LORazepam (ATIVAN) 0.5 MG tablet Take 1-2 tablets (0.5-1 mg total) by mouth every 2 (two) hours as needed for anxiety or seizure. Patient not taking: Reported on 11/05/2014 03/07/14   Blake Divine, MD   BP 140/66 mmHg  Pulse 78  Temp(Src) 98.3 F (36.8 C) (Oral)  Resp 18  SpO2 94% Physical Exam  Constitutional: She is oriented to person, place, and time. She appears well-developed and well-nourished.  The patient has fatigued appearance. She is however cognitively appropriate and following commands for exam. She also has appropriate speech. As baseline she is on 5 L of O2.  HENT:  Head: Normocephalic and atraumatic.  Mucous membranes slightly dry.  Eyes: EOM are normal. Pupils are equal, round, and reactive to light.  Neck: Neck supple.  Cardiovascular: Regular rhythm, normal heart sounds and intact distal pulses.   Irregularly irregular.  Pulmonary/Chest: Effort normal and breath sounds normal. No respiratory distress.  Abdominal: Soft. Bowel sounds are normal. She exhibits no distension. There is no tenderness.  Patient has had emesis of bilious appearing material while in the room.  Musculoskeletal: Normal range of motion. She exhibits no  edema or tenderness.  Neurological: She is alert and oriented to person, place, and time. She has normal strength. No cranial nerve deficit. She exhibits normal muscle tone. Coordination normal. GCS eye subscore is 4. GCS verbal subscore is 5. GCS motor subscore is 6.  Skin: Skin is warm, dry and intact.  Psychiatric: She has a normal mood and affect.    ED Course  Procedures (including critical care time) Labs Review Labs Reviewed  COMPREHENSIVE METABOLIC PANEL - Abnormal; Notable for the following:    Glucose, Bld 167 (*)    BUN 27 (*)    Total Bilirubin 1.6 (*)    GFR calc non Af Amer 53 (*)  All other components within normal limits  LIPASE, BLOOD - Abnormal; Notable for the following:    Lipase 12 (*)    All other components within normal limits  BRAIN NATRIURETIC PEPTIDE - Abnormal; Notable for the following:    B Natriuretic Peptide 1327.6 (*)    All other components within normal limits  CBC WITH DIFFERENTIAL/PLATELET - Abnormal; Notable for the following:    WBC 11.3 (*)    Platelets 92 (*)    Neutrophils Relative % 95 (*)    Lymphocytes Relative 3 (*)    Monocytes Relative 2 (*)    Neutro Abs 10.8 (*)    Lymphs Abs 0.3 (*)    All other components within normal limits  I-STAT CG4 LACTIC ACID, ED - Abnormal; Notable for the following:    Lactic Acid, Venous 2.90 (*)    All other components within normal limits  CULTURE, BLOOD (ROUTINE X 2)  CULTURE, BLOOD (ROUTINE X 2)  TROPONIN I  PROTIME-INR  URINALYSIS, ROUTINE W REFLEX MICROSCOPIC (NOT AT [facility])    Imaging Review Ct Abdomen Pelvis Wo Contrast  11/05/2014   CLINICAL DATA:  Unspecified abdominal pain, nausea and vomiting.  EXAM: CT ABDOMEN AND PELVIS WITHOUT CONTRAST  TECHNIQUE: Multidetector CT imaging of the abdomen and pelvis was performed following the standard protocol without IV contrast.  COMPARISON:  08/21/2013  FINDINGS: Lower chest: Thickening of the secondary pulmonary lobular septa at the lung bases  with interstitial accentuation, cardiomegaly, dense and prominent mitral valve calcification, and coronary artery atherosclerotic calcification. Ectatic descending thoracic aorta. Trace bilateral pleural effusions.  Hepatobiliary: Cholecystectomy. Minimal intrahepatic biliary dilatation.  Pancreas: Unremarkable  Spleen: Unremarkable  Adrenals/Urinary Tract: Stable fullness of both adrenal glands. Right kidney upper pole cyst, 5.5 cm diameter, fluid density. 2 mm right kidney lower pole nonobstructive calculus. Exaggerated anterior axis of the right kidney. Several tiny nonobstructive left renal calculi are present. No hydronephrosis or hydroureter.  Stomach/Bowel: Sigmoid diverticulosis. Appendix normal. No dilated bowel.  Vascular/Lymphatic: Infrarenal abdominal aortic ectasia, 2.7 by 2.9 cm, image 39 series 2. Aortoiliac atherosclerotic vascular disease.  Reproductive: Parametrial vascular calcification. Otherwise negative.  Other: No supplemental non-categorized findings.  Musculoskeletal: T12 compression fracture, currently 50% loss of vertebral height, previously 35% loss of vertebral body height. 8 mm posterior bony retropulsion, increased from prior. There is also 5 mm retrolisthesis at T12-L1.  6 mm anterolisthesis at L4-5. Previously suspected pars defects at L4 are poorly characterized/seen on today's exam, without visualized pars discontinuity, and the subluxation might be degenerative.  IMPRESSION: 1. A specific cause for the patient's nausea and abdominal pain is not identified. 2. Trace bilateral pleural effusions with interstitial opacity in the lung bases suggesting interstitial pulmonary edema. Consider chest radiography. 3. Dense mitral valve calcification.  Coronary atherosclerosis. 4. Small bilateral nonobstructive renal calculi. 5. Sigmoid diverticulosis. 6. Infrarenal abdominal aortic ectasia. 7. Increase in the degree of compression of the T12 vertebral body compared to 08/21/13, currently 50%  loss of height and 8 mm posterior bony retropulsion. This bony retropulsion causes moderate central narrowing of the thecal sac. 8. I suspect that the 6 mm anterolisthesis at L4-5 is actually degenerative rather than due to pars defects at L4. Today I do not observe definite pars defects.   Electronically Signed   By: Gaylyn Rong M.D.   On: 11/05/2014 15:41     EKG Interpretation   Date/Time:  Friday November 05 2014 12:49:50 EDT Ventricular Rate:  79 PR Interval:    QRS Duration:  137 QT Interval:  506 QTC Calculation: 580 R Axis:   -69 Text Interpretation:  Atrial fibrillation Left bundle branch block  Confirmed by Donnald Garre, MD, Lebron Conners [phone]) on 11/05/2014 2:14:09 PM      MDM   Final diagnoses:  Intractable vomiting with nausea, vomiting of unspecified type  Severe comorbid illness   Presents on above with intractable vomiting. At this point time CT does not show acute surgical etiology. With the patient's age however and bilious emesis, she will be admitted for ongoing monitoring and hydration. She has multiple comorbidities illnesses.    Arby Barrette, MD 11/05/14 [phone]

## 2014-11-05 NOTE — Telephone Encounter (Signed)
New message       Nurse was called to pts home.  Her bp is 152/110.  She is pale as a ghost, c/o severe nausea and some vomiting.  She does not have a fever. Pt is very weak.  Please call

## 2014-11-05 NOTE — ED Notes (Signed)
Bed: WA07 Expected date:  Expected time:  Means of arrival:  Comments: EMS 

## 2014-11-05 NOTE — ED Notes (Signed)
RN is starting an IV line

## 2014-11-05 NOTE — ED Notes (Signed)
Writer notifed EDP of abnormal I stat lactic result.

## 2014-11-05 NOTE — ED Notes (Signed)
Patient is aware we need a urine specimen. 

## 2014-11-05 NOTE — Telephone Encounter (Signed)
Spoke with [facility]) states pt has had nausea & vomiting all morning Temp 96.9  1 small loose stool today. Advised to call Hospice MD for antinausea.  Darel Hong was also concerned that pt is taking potassium every other day & Potassium 60 meq on opposite days.  She wondered if this could cause some of her nausea.  Darel Hong also wanted to know if pt should have lab work sooner than August since she has been on this potassium regimen since mid-May?  I have forwarded this to Dr. Tenny Craw for review. Darel Hong will also consult with hospice MD about the nausea medication. Mylo Red RN

## 2014-11-06 DIAGNOSIS — I482 Chronic atrial fibrillation: Secondary | ICD-10-CM

## 2014-11-06 DIAGNOSIS — J9611 Chronic respiratory failure with hypoxia: Secondary | ICD-10-CM

## 2014-11-06 LAB — BASIC METABOLIC PANEL
Anion gap: 8 (ref 5–15)
BUN: 25 mg/dL — ABNORMAL HIGH (ref 6–20)
CHLORIDE: 109 mmol/L (ref 101–111)
CO2: 26 mmol/L (ref 22–32)
Calcium: 8.6 mg/dL — ABNORMAL LOW (ref 8.9–10.3)
Creatinine, Ser: 1.01 mg/dL — ABNORMAL HIGH (ref 0.44–1.00)
GFR calc non Af Amer: 49 mL/min — ABNORMAL LOW (ref >60)
GFR, EST AFRICAN AMERICAN: 57 mL/min — AB (ref >60)
GLUCOSE: 121 mg/dL — AB (ref 65–99)
POTASSIUM: 4.3 mmol/L (ref 3.5–5.1)
Sodium: 143 mmol/L (ref 135–145)

## 2014-11-06 LAB — CBC
HEMATOCRIT: 41.3 % (ref 36.0–46.0)
HEMOGLOBIN: 13 g/dL (ref 12.0–15.0)
MCH: 31 pg (ref 26.0–34.0)
MCHC: 31.5 g/dL (ref 30.0–36.0)
MCV: 98.3 fL (ref 78.0–100.0)
Platelets: 109 10*3/uL — ABNORMAL LOW (ref 150–400)
RBC: 4.2 MIL/uL (ref 3.87–5.11)
RDW: 14.9 % (ref 11.5–15.5)
WBC: 10.9 10*3/uL — AB (ref 4.0–10.5)

## 2014-11-06 LAB — MRSA PCR SCREENING: MRSA by PCR: NEGATIVE

## 2014-11-06 LAB — TROPONIN I
TROPONIN I: 0.04 ng/mL — AB (ref <0.031)
TROPONIN I: 0.04 ng/mL — AB (ref <0.031)

## 2014-11-06 MED ORDER — CYCLOBENZAPRINE HCL 5 MG PO TABS
2.5000 mg | ORAL_TABLET | Freq: Once | ORAL | Status: AC
  Administered 2014-11-06: 2.5 mg via ORAL

## 2014-11-06 MED ORDER — FUROSEMIDE 20 MG PO TABS
20.0000 mg | ORAL_TABLET | Freq: Every day | ORAL | Status: DC
  Administered 2014-11-06 – 2014-11-08 (×3): 20 mg via ORAL
  Filled 2014-11-06 (×3): qty 1

## 2014-11-06 MED ORDER — LORAZEPAM 0.5 MG PO TABS: 0.5000 mg | ORAL_TABLET | ORAL | Status: DC | PRN

## 2014-11-06 MED ORDER — NAPROXEN 250 MG PO TABS
250.0000 mg | ORAL_TABLET | Freq: Two times a day (BID) | ORAL | Status: DC | PRN
  Administered 2014-11-06 – 2014-11-07 (×2): 250 mg via ORAL
  Filled 2014-11-06 (×3): qty 1

## 2014-11-06 NOTE — Progress Notes (Signed)
PATIENT DETAILS Name: Patricia [name] Age: 81 y.o. Sex: female Date of Birth: 1926/09/25 Admit Date: 11/05/2014 Admitting Physician Dewayne Shorter Levora Dredge, MD LGX:[id number],RDEYCX DAVIDSON, MD  Subjective: Vomiting seems to have resolved, tolerating clear liquids.Complains of neck pain today  Assessment/Plan: Vomiting without nausea, intractable:Suspect secondary to viral syndrome.CT abdomen without any obstruction. Her abdomen exam is benign. Much better with supportive measures, slowly advance diet. Continue with prn anti-emetics. Follow clinical course.   Neck pain: supportive care with NSAID's and one dose of flexeril.   Chronic atrial fibrillation:. Continue with the metoprolol. Reviewed prior cardiology outpatient notes, not a anticoagulation candidate.   Chronic respiratory failure: On 5 L of oxygen at home-continue   Chronic diastolic heart failure: Clinically compensated   Hypertrophic obstructive cardiomyopathy: Continue beta blocker.   Pulmonary hypertension, secondary: Probably secondary to COPD and CHF.  COPD: Lungs currently clear. Continue with as needed bronchodilators   Disposition: Remain inpatient-back to ALF over the next 1-2 days  Antimicrobial agents  See below  Anti-infectives    None      DVT Prophylaxis:  SCD's-family does not want pharmacological prophylaxis  Code Status:  DNR  Family Communication Daughter at bedside  Procedures: None  CONSULTS:  None  Time spent 30 minutes-Greater than 50% of this time was spent in counseling, explanation of diagnosis, planning of further management, and coordination of care.  MEDICATIONS: Scheduled Meds: . amiodarone  100 mg Oral Daily  . cholecalciferol  1,000 Units Oral QHS  . levothyroxine  62.5 mcg Oral QAC breakfast  . metoprolol tartrate  25 mg Oral BID  . sertraline  100 mg Oral Daily  . sodium chloride  3 mL Intravenous Q12H   Continuous Infusions:  PRN  Meds:.acetaminophen **OR** acetaminophen, albuterol, guaiFENesin-dextromethorphan, naproxen, nitroGLYCERIN, promethazine    PHYSICAL EXAM: Vital signs in last 24 hours: Filed Vitals:   11/05/14 1843 11/05/14 1914 11/06/14 0004 11/06/14 0607  BP: 139/93 154/59 132/64 157/84  Pulse: 65 55  56  Temp:  98.1 F (36.7 C)  98.1 F (36.7 C)  TempSrc:  Oral  Oral  Resp: 18 20  20   Height:  5\' 4"  (1.626 m)    Weight:  72.6 kg (160 lb 0.9 oz)    SpO2: 97% 94%  96%    Weight change:  Filed Weights   11/05/14 1914  Weight: 72.6 kg (160 lb 0.9 oz)   Body mass index is 27.46 kg/(m^2).   Gen Exam: Awake and alert with clear speech.   Neck: Supple, No JVD.   Chest: B/L Clear.   CVS: S1 S2 IRREGULAR Abdomen: soft, BS +, non tender, non distended.  Extremities: no edema, lower extremities warm to touch. Neurologic: Non Focal.  Skin: No Rash.   Wounds: N/A.    Intake/Output from previous day:  Intake/Output Summary (Last 24 hours) at 11/06/14 1049 Last data filed at 11/06/14 0650  Gross per 24 hour  Intake  752.5 ml  Output      0 ml  Net  752.5 ml     LAB RESULTS: CBC  Recent Labs Lab 11/05/14 1418 11/05/14 2115 11/06/14 0236  WBC 11.3* 9.5 10.9*  HGB 13.8 12.6 13.0  HCT 43.1 39.3 41.3  PLT 92* 95* 109*  MCV 96.9 97.5 98.3  MCH 31.0 31.3 31.0  MCHC 32.0 32.1 31.5  RDW 14.5 14.6 14.9  LYMPHSABS 0.3*  --   --  MONOABS 0.2  --   --   EOSABS 0.0  --   --   BASOSABS 0.0  --   --     Chemistries   Recent Labs Lab 11/05/14 1418 11/05/14 2115 11/06/14 0236  NA 141  --  143  K 4.3  --  4.3  CL 105  --  109  CO2 25  --  26  GLUCOSE 167*  --  121*  BUN 27*  --  25*  CREATININE 0.95 0.88 1.01*  CALCIUM 9.3  --  8.6*    CBG: No results for input(s): GLUCAP in the last 168 hours.  GFR Estimated Creatinine Clearance: 39.8 mL/min (by C-G formula based on Cr of 1.01).  Coagulation profile  Recent Labs Lab 11/05/14 1418  INR 1.13    Cardiac  Enzymes  Recent Labs Lab 11/05/14 2115 11/06/14 0236 11/06/14 0838  TROPONINI 0.03 0.04* 0.04*    Invalid input(s): POCBNP No results for input(s): DDIMER in the last 72 hours. No results for input(s): HGBA1C in the last 72 hours. No results for input(s): CHOL, HDL, LDLCALC, TRIG, CHOLHDL, LDLDIRECT in the last 72 hours. No results for input(s): TSH, T4TOTAL, T3FREE, THYROIDAB in the last 72 hours.  Invalid input(s): FREET3 No results for input(s): VITAMINB12, FOLATE, FERRITIN, TIBC, IRON, RETICCTPCT in the last 72 hours.  Recent Labs  11/05/14 1418  LIPASE 12*    Urine Studies No results for input(s): UHGB, CRYS in the last 72 hours.  Invalid input(s): UACOL, UAPR, USPG, UPH, UTP, UGL, UKET, UBIL, UNIT, UROB, ULEU, UEPI, UWBC, URBC, UBAC, CAST, UCOM, BILUA  MICROBIOLOGY: Recent Results (from the past 240 hour(s))  Culture, blood (routine x 2)     Status: None (Preliminary result)   Collection Time: 11/05/14  2:15 PM  Result Value Ref Range Status   Specimen Description BLOOD LEFT WRIST  Final   Special Requests   Final    BOTTLES DRAWN AEROBIC AND ANAEROBIC 4CC Performed at [facility]    Culture PENDING  Incomplete   Report Status PENDING  Incomplete  MRSA PCR Screening     Status: None   Collection Time: 11/06/14  7:50 AM  Result Value Ref Range Status   MRSA by PCR NEGATIVE NEGATIVE Final    Comment:        The GeneXpert MRSA Assay (FDA approved for NASAL specimens only), is one component of a comprehensive MRSA colonization surveillance program. It is not intended to diagnose MRSA infection nor to guide or monitor treatment for MRSA infections.     RADIOLOGY STUDIES/RESULTS: Ct Abdomen Pelvis Wo Contrast  11/05/2014   CLINICAL DATA:  Unspecified abdominal pain, nausea and vomiting.  EXAM: CT ABDOMEN AND PELVIS WITHOUT CONTRAST  TECHNIQUE: Multidetector CT imaging of the abdomen and pelvis was performed following the standard protocol without  IV contrast.  COMPARISON:  08/21/2013  FINDINGS: Lower chest: Thickening of the secondary pulmonary lobular septa at the lung bases with interstitial accentuation, cardiomegaly, dense and prominent mitral valve calcification, and coronary artery atherosclerotic calcification. Ectatic descending thoracic aorta. Trace bilateral pleural effusions.  Hepatobiliary: Cholecystectomy. Minimal intrahepatic biliary dilatation.  Pancreas: Unremarkable  Spleen: Unremarkable  Adrenals/Urinary Tract: Stable fullness of both adrenal glands. Right kidney upper pole cyst, 5.5 cm diameter, fluid density. 2 mm right kidney lower pole nonobstructive calculus. Exaggerated anterior axis of the right kidney. Several tiny nonobstructive left renal calculi are present. No hydronephrosis or hydroureter.  Stomach/Bowel: Sigmoid diverticulosis. Appendix normal. No dilated bowel.  Vascular/Lymphatic: Infrarenal abdominal aortic ectasia, 2.7 by 2.9 cm, image 39 series 2. Aortoiliac atherosclerotic vascular disease.  Reproductive: Parametrial vascular calcification. Otherwise negative.  Other: No supplemental non-categorized findings.  Musculoskeletal: T12 compression fracture, currently 50% loss of vertebral height, previously 35% loss of vertebral body height. 8 mm posterior bony retropulsion, increased from prior. There is also 5 mm retrolisthesis at T12-L1.  6 mm anterolisthesis at L4-5. Previously suspected pars defects at L4 are poorly characterized/seen on today's exam, without visualized pars discontinuity, and the subluxation might be degenerative.  IMPRESSION: 1. A specific cause for the patient's nausea and abdominal pain is not identified. 2. Trace bilateral pleural effusions with interstitial opacity in the lung bases suggesting interstitial pulmonary edema. Consider chest radiography. 3. Dense mitral valve calcification.  Coronary atherosclerosis. 4. Small bilateral nonobstructive renal calculi. 5. Sigmoid diverticulosis. 6.  Infrarenal abdominal aortic ectasia. 7. Increase in the degree of compression of the T12 vertebral body compared to 08/21/13, currently 50% loss of height and 8 mm posterior bony retropulsion. This bony retropulsion causes moderate central narrowing of the thecal sac. 8. I suspect that the 6 mm anterolisthesis at L4-5 is actually degenerative rather than due to pars defects at L4. Today I do not observe definite pars defects.   Electronically Signed   By: Gaylyn Rong M.D.   On: 11/05/2014 15:41   Dg Chest 2 View  11/05/2014   CLINICAL DATA:  Assisted living patient. Abdominal pain, nausea. Cardiovascular disease.  EXAM: CHEST  2 VIEW  COMPARISON:  02/13/2014  FINDINGS: Sternotomy wires overlie an enlarged cardiac silhouette. Pulmonary arteries are prominent. There is chronic interstitial lung disease.  Chronic compression fracture of the upper lumbar spine again demonstrated.  IMPRESSION: 1. No acute cardiopulmonary findings. 2. Chronic interstitial lung disease again demonstrated.   Electronically Signed   By: Genevive Bi M.D.   On: 11/05/2014 17:49    Jeoffrey Massed, MD  [facility] Pager:[phone]  If 7PM-7AM, please contact night-coverage [email or web address] Password [email or web address] 11/06/2014, 10:49 AM   LOS: 1 day

## 2014-11-06 NOTE — Progress Notes (Signed)
Patient had one episode of emesis. Light brown liquid in color.  Medication was given.

## 2014-11-07 MED ORDER — CYCLOBENZAPRINE HCL 5 MG PO TABS
5.0000 mg | ORAL_TABLET | Freq: Three times a day (TID) | ORAL | Status: DC | PRN
  Administered 2014-11-07: 5 mg via ORAL
  Filled 2014-11-07: qty 1

## 2014-11-07 NOTE — Progress Notes (Signed)
PATIENT DETAILS Name: Patricia [name] Age: 81 y.o. Sex: female Date of Birth: Sep 18, 1926 Admit Date: 11/05/2014 Admitting Physician Dewayne Shorter Levora Dredge, MD RUE:[id number] DAVIDSON, MD  Subjective: Vomiting resolved. Neck pain markedly better  Assessment/Plan: Vomiting without nausea, intractable:Suspect secondary to viral syndrome.CT abdomen without any obstruction. Her abdomen exam is benign. Resolved with supportive measures, slowly advance diet-now tolerating a regular diet.  Neck pain: improved with supportive care with NSAID's and one dose of flexeril.   Chronic atrial fibrillation:. Continue with the metoprolol. Reviewed prior cardiology outpatient notes, not a anticoagulation candidate.   Chronic respiratory failure: On 5 L of oxygen at home-continue   Chronic diastolic heart failure: Clinically compensated   Hypertrophic obstructive cardiomyopathy: Continue beta blocker.   Pulmonary hypertension, secondary: Probably secondary to COPD and CHF.  COPD: Lungs currently clear. Continue with as needed bronchodilators   Disposition: Remain inpatient-back to ALF today or tomorrow-await PT eval  Antimicrobial agents  See below  Anti-infectives    None      DVT Prophylaxis:  SCD's-family does not want pharmacological prophylaxis  Code Status:  DNR  Family Communication None at bedside  Procedures: None  CONSULTS:  None  Time spent 63minutes-Greater than 50% of this time was spent in counseling, explanation of diagnosis, planning of further management, and coordination of care.  MEDICATIONS: Scheduled Meds: . amiodarone  100 mg Oral Daily  . cholecalciferol  1,000 Units Oral QHS  . furosemide  20 mg Oral Daily  . levothyroxine  62.5 mcg Oral QAC breakfast  . metoprolol tartrate  25 mg Oral BID  . sertraline  100 mg Oral Daily  . sodium chloride  3 mL Intravenous Q12H   Continuous Infusions:  PRN Meds:.acetaminophen **OR**  acetaminophen, albuterol, guaiFENesin-dextromethorphan, LORazepam, naproxen, nitroGLYCERIN, promethazine    PHYSICAL EXAM: Vital signs in last 24 hours: Filed Vitals:   11/06/14 2152 11/07/14 0606 11/07/14 0912 11/07/14 1149  BP: 119/76 149/96    Pulse: 64 74 62   Temp: 98.2 F (36.8 C) 98.2 F (36.8 C)    TempSrc: Oral Oral    Resp: 20 20    Height:      Weight:      SpO2: 95% 95%  90%    Weight change:  Filed Weights   11/05/14 1914  Weight: 72.6 kg (160 lb 0.9 oz)   Body mass index is 27.46 kg/(m^2).   Gen Exam: Awake and alert with clear speech.   Neck: Supple, No JVD.   Chest: B/L Clear.   CVS: S1 S2 IRREGULAR Abdomen: soft, BS +, non tender, non distended.  Extremities: no edema, lower extremities warm to touch. Neurologic: Non Focal.  Skin: No Rash.   Wounds: N/A.    Intake/Output from previous day:  Intake/Output Summary (Last 24 hours) at 11/07/14 1200 Last data filed at 11/07/14 1050  Gross per 24 hour  Intake    183 ml  Output    700 ml  Net   -517 ml     LAB RESULTS: CBC  Recent Labs Lab 11/05/14 1418 11/05/14 2115 11/06/14 0236  WBC 11.3* 9.5 10.9*  HGB 13.8 12.6 13.0  HCT 43.1 39.3 41.3  PLT 92* 95* 109*  MCV 96.9 97.5 98.3  MCH 31.0 31.3 31.0  MCHC 32.0 32.1 31.5  RDW 14.5 14.6 14.9  LYMPHSABS 0.3*  --   --   MONOABS 0.2  --   --  EOSABS 0.0  --   --   BASOSABS 0.0  --   --     Chemistries   Recent Labs Lab 11/05/14 1418 11/05/14 2115 11/06/14 0236  NA 141  --  143  K 4.3  --  4.3  CL 105  --  109  CO2 25  --  26  GLUCOSE 167*  --  121*  BUN 27*  --  25*  CREATININE 0.95 0.88 1.01*  CALCIUM 9.3  --  8.6*    CBG: No results for input(s): GLUCAP in the last 168 hours.  GFR Estimated Creatinine Clearance: 39.8 mL/min (by C-G formula based on Cr of 1.01).  Coagulation profile  Recent Labs Lab 11/05/14 1418  INR 1.13    Cardiac Enzymes  Recent Labs Lab 11/05/14 2115 11/06/14 0236 11/06/14 0838    TROPONINI 0.03 0.04* 0.04*    Invalid input(s): POCBNP No results for input(s): DDIMER in the last 72 hours. No results for input(s): HGBA1C in the last 72 hours. No results for input(s): CHOL, HDL, LDLCALC, TRIG, CHOLHDL, LDLDIRECT in the last 72 hours. No results for input(s): TSH, T4TOTAL, T3FREE, THYROIDAB in the last 72 hours.  Invalid input(s): FREET3 No results for input(s): VITAMINB12, FOLATE, FERRITIN, TIBC, IRON, RETICCTPCT in the last 72 hours.  Recent Labs  11/05/14 1418  LIPASE 12*    Urine Studies No results for input(s): UHGB, CRYS in the last 72 hours.  Invalid input(s): UACOL, UAPR, USPG, UPH, UTP, UGL, UKET, UBIL, UNIT, UROB, ULEU, UEPI, UWBC, URBC, UBAC, CAST, UCOM, BILUA  MICROBIOLOGY: Recent Results (from the past 240 hour(s))  Culture, blood (routine x 2)     Status: None (Preliminary result)   Collection Time: 11/05/14  2:15 PM  Result Value Ref Range Status   Specimen Description BLOOD LAC  Final   Special Requests BOTTLES DRAWN AEROBIC AND ANAEROBIC  Final   Culture   Final    NO GROWTH < 24 HOURS Performed at [facility]    Report Status PENDING  Incomplete  Culture, blood (routine x 2)     Status: None (Preliminary result)   Collection Time: 11/05/14  2:15 PM  Result Value Ref Range Status   Specimen Description BLOOD LEFT WRIST  Final   Special Requests BOTTLES DRAWN AEROBIC AND ANAEROBIC 4CC  Final   Culture   Final    NO GROWTH < 24 HOURS Performed at [facility]    Report Status PENDING  Incomplete  MRSA PCR Screening     Status: None   Collection Time: 11/06/14  7:50 AM  Result Value Ref Range Status   MRSA by PCR NEGATIVE NEGATIVE Final    Comment:        The GeneXpert MRSA Assay (FDA approved for NASAL specimens only), is one component of a comprehensive MRSA colonization surveillance program. It is not intended to diagnose MRSA infection nor to guide or monitor treatment for MRSA infections.      RADIOLOGY STUDIES/RESULTS: Ct Abdomen Pelvis Wo Contrast  11/05/2014   CLINICAL DATA:  Unspecified abdominal pain, nausea and vomiting.  EXAM: CT ABDOMEN AND PELVIS WITHOUT CONTRAST  TECHNIQUE: Multidetector CT imaging of the abdomen and pelvis was performed following the standard protocol without IV contrast.  COMPARISON:  08/21/2013  FINDINGS: Lower chest: Thickening of the secondary pulmonary lobular septa at the lung bases with interstitial accentuation, cardiomegaly, dense and prominent mitral valve calcification, and coronary artery atherosclerotic calcification. Ectatic descending thoracic aorta. Trace bilateral  pleural effusions.  Hepatobiliary: Cholecystectomy. Minimal intrahepatic biliary dilatation.  Pancreas: Unremarkable  Spleen: Unremarkable  Adrenals/Urinary Tract: Stable fullness of both adrenal glands. Right kidney upper pole cyst, 5.5 cm diameter, fluid density. 2 mm right kidney lower pole nonobstructive calculus. Exaggerated anterior axis of the right kidney. Several tiny nonobstructive left renal calculi are present. No hydronephrosis or hydroureter.  Stomach/Bowel: Sigmoid diverticulosis. Appendix normal. No dilated bowel.  Vascular/Lymphatic: Infrarenal abdominal aortic ectasia, 2.7 by 2.9 cm, image 39 series 2. Aortoiliac atherosclerotic vascular disease.  Reproductive: Parametrial vascular calcification. Otherwise negative.  Other: No supplemental non-categorized findings.  Musculoskeletal: T12 compression fracture, currently 50% loss of vertebral height, previously 35% loss of vertebral body height. 8 mm posterior bony retropulsion, increased from prior. There is also 5 mm retrolisthesis at T12-L1.  6 mm anterolisthesis at L4-5. Previously suspected pars defects at L4 are poorly characterized/seen on today's exam, without visualized pars discontinuity, and the subluxation might be degenerative.  IMPRESSION: 1. A specific cause for the patient's nausea and abdominal pain is not  identified. 2. Trace bilateral pleural effusions with interstitial opacity in the lung bases suggesting interstitial pulmonary edema. Consider chest radiography. 3. Dense mitral valve calcification.  Coronary atherosclerosis. 4. Small bilateral nonobstructive renal calculi. 5. Sigmoid diverticulosis. 6. Infrarenal abdominal aortic ectasia. 7. Increase in the degree of compression of the T12 vertebral body compared to 08/21/13, currently 50% loss of height and 8 mm posterior bony retropulsion. This bony retropulsion causes moderate central narrowing of the thecal sac. 8. I suspect that the 6 mm anterolisthesis at L4-5 is actually degenerative rather than due to pars defects at L4. Today I do not observe definite pars defects.   Electronically Signed   By: Gaylyn Rong M.D.   On: 11/05/2014 15:41   Dg Chest 2 View  11/05/2014   CLINICAL DATA:  Assisted living patient. Abdominal pain, nausea. Cardiovascular disease.  EXAM: CHEST  2 VIEW  COMPARISON:  02/13/2014  FINDINGS: Sternotomy wires overlie an enlarged cardiac silhouette. Pulmonary arteries are prominent. There is chronic interstitial lung disease.  Chronic compression fracture of the upper lumbar spine again demonstrated.  IMPRESSION: 1. No acute cardiopulmonary findings. 2. Chronic interstitial lung disease again demonstrated.   Electronically Signed   By: Genevive Bi M.D.   On: 11/05/2014 17:49    Jeoffrey Massed, MD  [facility] Pager:[phone]  If 7PM-7AM, please contact night-coverage [email or web address] Password [email or web address] 11/07/2014, 12:00 PM   LOS: 2 days

## 2014-11-07 NOTE — Clinical Social Work Note (Signed)
CSW received a call from MD asking to discharge pt back to ALF.  Per MD PT evaluation had not been done so there was a rush on it.  PT eval completed in the afternoon recommending SNF, so CSW met with pt and her family to discuss SNF options.  Pt and family do not want SNF.  Pt wants to go back to ALF where her husband is.  Hospice is also follwoing  CSW faxed pt's PT eval and called and spoke with ALF nurse angela who stated they can take pt back and will refer to their PT dept.  MD stated that he wanted to wait until tomorrow to discharge due to pt health issues, ALF was notified.  CSW will continue to follow pt until discharge  . , LCSW  [facility] Clinical Social Worker - Weekend Coverage cell #: [phone] 

## 2014-11-07 NOTE — Plan of Care (Signed)
Problem: Phase I Progression Outcomes Goal: Initial discharge plan identified Outcome: Completed/Met Date Met:  11/07/14 Back to facility tomorrow.     

## 2014-11-07 NOTE — Evaluation (Signed)
Physical Therapy Evaluation Patient Details Name: Patricia [name] MRN: [id number] DOB: [birth year] Today's Date: 11/07/2014   History of Present Illness  Patricia [name] is 81 yo with h/o of HOCM, chronic a fib, mitral stenosis, mild CAD, on 5 L at all times at home, and is a resisdent ith her husband at ALF [facility]). She comes to hospital with nauseated and vomiting episodes.  Husband uses RW, and pt does as well. Prior to this visist she was independent with basic ADLs(hospice would be with her for her bath), used rollator to get to  dining hall.   Clinical Impression  Pt presents with great weakness and limited mobility differing from her baseline at ALF. In addition pt has "crick" in her neck that has limit movement and ROM causing head to be tilted and turned to R at all times. Performed shoulder rolls,and shoulder movement and educated dtr on how to help loosen up this area. Also positioned in bed on left side to assist with this movement with gravity. This along with pt's baseline diplopia and weakness made mobility tricky this morning. To benefit from continued PT to increase strength and mobility to eventually return to ALF at [facility] level.     Follow Up Recommendations SNF (at this moment, pt not safe to return to ALF, will continue to follow to see if pt progresses to be able. )    Equipment Recommendations  None recommended by PT    Recommendations for Other Services       Precautions / Restrictions Precautions Precautions: Fall Restrictions Weight Bearing Restrictions: No      Mobility  Bed Mobility Overal bed mobility: Needs Assistance Bed Mobility: Supine to Sit;Sit to Supine     Supine to sit: Min guard Sit to supine: Min guard   General bed mobility comments: uses rail, and HOB elevated.   Transfers Overall transfer level: Needs assistance Equipment used: Rolling walker (2 wheeled) Transfers: Sit to/from Stand Sit to Stand: Mod assist         General transfer  comment: slight LOB with rising , stated a little dizzy and weak.   Ambulation/Gait Ambulation/Gait assistance: Mod assist Ambulation Distance (Feet): 30 Feet Assistive device: Rolling walker (2 wheeled)       General Gait Details: small shuffled gait, verbal cues, and tactile cues for upright posture, to be in walker for safety , and holding head up. This little distance completely wiped pt out, when she returned to the bed she was SOB and very fatigued with eyes closed and  oce laid back down she went start to sleep. As reported by dtr this is not her mom's normal at all.   Stairs            Wheelchair Mobility    Modified Rankin (Stroke Patients Only)       Balance Overall balance assessment: Needs assistance Sitting-balance support: Bilateral upper extremity supported Sitting balance-Leahy Scale: Fair Sitting balance - Comments: had to provided min to Mod assist after ambulation  Postural control: Right lateral lean Standing balance support: Bilateral upper extremity supported Standing balance-Leahy Scale: Fair                               Pertinent Vitals/Pain Pain Assessment: 0-10 Pain Score: 4  Pain Location: neck . Pt stated she has been getting crick in her necl over past month and it is worse. Pt lies and sits with her head  tiotlted and rotated to the right. Increased pain with palpation of uper traps and attmeping to [facility] for movement.  Pain Descriptors / Indicators: Aching;Constant Pain Intervention(s): Monitored during session;Repositioned (pt has been getting heat packs to the L trap area. )    Home Living Family/patient expects to be discharged to:: Assisted living ([facility])               Home Equipment: Environmental consultant - 2 wheels;Walker - 4 wheels;Shower seat Additional Comments: husand lives in apartment with her , but he is frail and uses RW for balance. Hopsice follows pt as well.     Prior Function Level of Independence: Needs  assistance   Gait / Transfers Assistance Needed: uses Rollator (4 whelled RW) for in apartment  to to get to dining hall (and down Engineer, structural)  ADL's / [employer] Assistance Needed: Independent with basic ADLs, but hospice helps with bathing        Hand Dominance        Extremity/Trunk Assessment               Lower Extremity Assessment: Generalized weakness      Cervical / Trunk Assessment: Other exceptions  Communication   Communication: No difficulties  Cognition Arousal/Alertness: Awake/alert Behavior During Therapy: WFL for tasks assessed/performed Overall Cognitive Status: Within Functional Limits for tasks assessed                      General Comments      Exercises        Assessment/Plan    PT Assessment Patient needs continued PT services  PT Diagnosis Difficulty walking;Generalized weakness   PT Problem List Decreased strength;Decreased activity tolerance;Decreased mobility  PT Treatment Interventions Gait training;Functional mobility training;Therapeutic activities;Therapeutic exercise;Patient/family education   PT Goals (Current goals can be found in the Care Plan section) Acute Rehab PT Goals Patient Stated Goal: I want to get better and retrun tomy apartmetn when I get stronger.  PT Goal Formulation: With patient/family Time For Goal Achievement: 11/21/14 Potential to Achieve Goals: Good    Frequency Min 3X/week   Barriers to discharge        Co-evaluation               End of Session Equipment Utilized During Treatment: Gait belt Activity Tolerance: Patient limited by fatigue Patient left: in bed;with call bell/phone within reach;with family/visitor present Nurse Communication: Mobility status         Time: 1120-1150 PT Time Calculation (min) (ACUTE ONLY): 30 min   Charges:   PT Evaluation $Initial PT Evaluation Tier I: 1 Procedure PT Treatments $Gait Training: 8-22 mins   PT G CodesMarella [name] December 01, 2014, 12:08 PM  Patricia [name], PT Pager: [phone] 2014/12/01

## 2014-11-08 MED ORDER — LORAZEPAM 0.5 MG PO TABS: 1.0000 mg | ORAL_TABLET | ORAL | Status: AC | PRN

## 2014-11-08 MED ORDER — ACETAMINOPHEN 325 MG PO TABS: 650.0000 mg | ORAL_TABLET | Freq: Four times a day (QID) | ORAL | Status: AC | PRN

## 2014-11-08 MED ORDER — LORAZEPAM 0.5 MG PO TABS: 0.5000 mg | ORAL_TABLET | ORAL | Status: DC | PRN

## 2014-11-08 MED ORDER — CYCLOBENZAPRINE HCL 5 MG PO TABS: 5.0000 mg | ORAL_TABLET | Freq: Three times a day (TID) | ORAL | Status: DC | PRN

## 2014-11-08 MED ORDER — ALBUTEROL SULFATE HFA 108 (90 BASE) MCG/ACT IN AERS: 2.0000 | INHALATION_SPRAY | Freq: Four times a day (QID) | RESPIRATORY_TRACT | Status: DC | PRN

## 2014-11-08 NOTE — Discharge Summary (Addendum)
PATIENT DETAILS Name: Patricia [name] Age: 81 y.o. Sex: female Date of Birth: 1927/05/09 MRN: [id number]. Admitting Physician: Maretta Bees, MD WUJ:[id number] Patricia Palma, MD  Admit Date: 11/05/2014 Discharge date: 11/08/2014  Recommendations for Outpatient Follow-up:  1. Please recheck CBC and chemistry in the next 1-2 weeks.   PRIMARY DISCHARGE DIAGNOSIS:  Principal Problem:   Vomiting without nausea, intractable Active Problems:   Hypertrophic obstructive cardiomyopathy   Chronic atrial fibrillation   History of mitral valve replacement with bioprosthetic valve   COPD, mild   Pulmonary hypertension, secondary   Chronic diastolic heart failure   Chronic respiratory failure      PAST MEDICAL HISTORY: Past Medical History  Diagnosis Date  . CAD (coronary artery disease)   . Mitral valve disorder     a. s/p MVR;  b. 06/2013 Echo: EF 55-60%, no rwma, Ao sclerosis, mild AI, mild MR, miod dil LA, sev dil RV with reduced fxn, sev dil RA, mod TR/PR, PASP (study reviewed by Dr. Verlin Fester- MS felt to be sev, peak grad 39/mean 13).  [name] HTN (hypertension)   . Dyslipidemia   . Aortic stenosis, mild   . Breast cyst   . Hypertrophic cardiomyopathy   . Sleep apnea   . COPD (chronic obstructive pulmonary disease)     "a touch of"; prn o2 at home with activity   . Atrial fibrillation     a. chronic coumadin.  [name] Hypothyroidism   . Anemia   . Chronic right-sided heart failure     DISCHARGE MEDICATIONS: Current Discharge Medication List    START taking these medications   Details  acetaminophen (TYLENOL) 325 MG tablet Take 2 tablets (650 mg total) by mouth every 6 (six) hours as needed for mild pain (or Fever >/= 101).    albuterol (PROVENTIL HFA;VENTOLIN HFA) 108 (90 BASE) MCG/ACT inhaler Inhale 2 puffs into the lungs every 6 (six) hours as needed for wheezing or shortness of breath. Qty: 1 Inhaler, Refills: 0    cyclobenzaprine (FLEXERIL) 5 MG tablet Take 1 tablet (5 mg  total) by mouth 3 (three) times daily as needed for muscle spasms. Qty: 30 tablet, Refills: 0      CONTINUE these medications which have CHANGED   Details  LORazepam (ATIVAN) 0.5 MG tablet Take 2 tablets (1 mg total) by mouth every 2 (two) hours as needed for anxiety or seizure. Qty: 15 tablet, Refills: 0      CONTINUE these medications which have NOT CHANGED   Details  amiodarone (PACERONE) 200 MG tablet Take 0.5 tablets (100 mg total) by mouth daily. Qty: 30 tablet, Refills: 6    Calcium Carb-Cholecalciferol (CALCIUM + D3) 600-200 MG-UNIT TABS Take 1 tablet by mouth 2 (two) times daily.    cholecalciferol (VITAMIN D) 1000 UNITS tablet Take 1,000 Units by mouth at bedtime.     furosemide (LASIX) 20 MG tablet TAKE ONE TABLET TWICE DAILY Qty: 60 tablet, Refills: 6    levothyroxine (SYNTHROID, LEVOTHROID) 125 MCG tablet Take 62.5 mcg by mouth daily before breakfast.    metoprolol tartrate (LOPRESSOR) 25 MG tablet Take 1 tablet (25 mg total) by mouth 2 (two) times daily. Qty: 60 tablet, Refills: 12    nitroGLYCERIN (NITROSTAT) 0.4 MG SL tablet Place 0.4 mg under the tongue every 5 (five) minutes as needed for chest pain.    potassium chloride SA (K-DUR,KLOR-CON) 20 MEQ tablet TAKE ONE TABLET TWICE DAILY Qty: 60 tablet, Refills: 6    Psyllium 48.57 % POWD  Take 3.4 g by mouth daily.    sertraline (ZOLOFT) 100 MG tablet Take 100 mg by mouth daily.   Associated Diagnoses: Anemia; Atrial fibrillation; SOB (shortness of breath)    calcitonin, salmon, (MIACALCIN/FORTICAL) 200 UNIT/ACT nasal spray ONE SPRAY IN ONE NOSTRIL DAILY ALTERNATE NOSTRILS DAILY Qty: 3.7 mL, Refills: 6        ALLERGIES:   Allergies  Allergen Reactions  . Ivp Dye [Iodinated Diagnostic Agents] Itching  . Nitrofurantoin Itching  . Ramipril Other (See Comments)    unknown    BRIEF HPI:  See H&P, Labs, Consult and Test reports for all details in brief, patient is a 81 y.o. female with a Past Medical  History of COPD with chronic respiratory failure on 5 L of oxygen at home, pulmonary hypertension, atrial fibrillation not on anticoagulation who presented with persistent vomiting. She was admitted for further evaluation and treatment  CONSULTATIONS:   None  PERTINENT RADIOLOGIC STUDIES: Ct Abdomen Pelvis Wo Contrast  11/05/2014   CLINICAL DATA:  Unspecified abdominal pain, nausea and vomiting.  EXAM: CT ABDOMEN AND PELVIS WITHOUT CONTRAST  TECHNIQUE: Multidetector CT imaging of the abdomen and pelvis was performed following the standard protocol without IV contrast.  COMPARISON:  08/21/2013  FINDINGS: Lower chest: Thickening of the secondary pulmonary lobular septa at the lung bases with interstitial accentuation, cardiomegaly, dense and prominent mitral valve calcification, and coronary artery atherosclerotic calcification. Ectatic descending thoracic aorta. Trace bilateral pleural effusions.  Hepatobiliary: Cholecystectomy. Minimal intrahepatic biliary dilatation.  Pancreas: Unremarkable  Spleen: Unremarkable  Adrenals/Urinary Tract: Stable fullness of both adrenal glands. Right kidney upper pole cyst, 5.5 cm diameter, fluid density. 2 mm right kidney lower pole nonobstructive calculus. Exaggerated anterior axis of the right kidney. Several tiny nonobstructive left renal calculi are present. No hydronephrosis or hydroureter.  Stomach/Bowel: Sigmoid diverticulosis. Appendix normal. No dilated bowel.  Vascular/Lymphatic: Infrarenal abdominal aortic ectasia, 2.7 by 2.9 cm, image 39 series 2. Aortoiliac atherosclerotic vascular disease.  Reproductive: Parametrial vascular calcification. Otherwise negative.  Other: No supplemental non-categorized findings.  Musculoskeletal: T12 compression fracture, currently 50% loss of vertebral height, previously 35% loss of vertebral body height. 8 mm posterior bony retropulsion, increased from prior. There is also 5 mm retrolisthesis at T12-L1.  6 mm anterolisthesis at  L4-5. Previously suspected pars defects at L4 are poorly characterized/seen on today's exam, without visualized pars discontinuity, and the subluxation might be degenerative.  IMPRESSION: 1. A specific cause for the patient's nausea and abdominal pain is not identified. 2. Trace bilateral pleural effusions with interstitial opacity in the lung bases suggesting interstitial pulmonary edema. Consider chest radiography. 3. Dense mitral valve calcification.  Coronary atherosclerosis. 4. Small bilateral nonobstructive renal calculi. 5. Sigmoid diverticulosis. 6. Infrarenal abdominal aortic ectasia. 7. Increase in the degree of compression of the T12 vertebral body compared to 08/21/13, currently 50% loss of height and 8 mm posterior bony retropulsion. This bony retropulsion causes moderate central narrowing of the thecal sac. 8. I suspect that the 6 mm anterolisthesis at L4-5 is actually degenerative rather than due to pars defects at L4. Today I do not observe definite pars defects.   Electronically Signed   By: Gaylyn Rong M.D.   On: 11/05/2014 15:41   Dg Chest 2 View  11/05/2014   CLINICAL DATA:  Assisted living patient. Abdominal pain, nausea. Cardiovascular disease.  EXAM: CHEST  2 VIEW  COMPARISON:  02/13/2014  FINDINGS: Sternotomy wires overlie an enlarged cardiac silhouette. Pulmonary arteries are prominent. There is chronic interstitial lung  disease.  Chronic compression fracture of the upper lumbar spine again demonstrated.  IMPRESSION: 1. No acute cardiopulmonary findings. 2. Chronic interstitial lung disease again demonstrated.   Electronically Signed   By: Genevive Bi M.D.   On: 11/05/2014 17:49     PERTINENT LAB RESULTS: CBC:  Recent Labs  11/05/14 2115 11/06/14 0236  WBC 9.5 10.9*  HGB 12.6 13.0  HCT 39.3 41.3  PLT 95* 109*   CMET CMP     Component Value Date/Time   NA 143 11/06/2014 0236   NA 137 10/30/2013 1705   K 4.3 11/06/2014 0236   K 4.0 10/30/2013 1705   CL 109  11/06/2014 0236   CL 101 10/30/2013 1705   CO2 26 11/06/2014 0236   CO2 32 10/30/2013 1705   GLUCOSE 121* 11/06/2014 0236   GLUCOSE 87 10/30/2013 1705   GLUCOSE 90 04/22/2006 0839   BUN 25* 11/06/2014 0236   BUN 15 10/30/2013 1705   CREATININE 1.01* 11/06/2014 0236   CREATININE 0.77 10/30/2013 1705   CREATININE 0.83 09/11/2013 1624   CALCIUM 8.6* 11/06/2014 0236   CALCIUM 8.4* 10/30/2013 1705   PROT 7.6 11/05/2014 1418   ALBUMIN 4.2 11/05/2014 1418   AST 41 11/05/2014 1418   ALT 25 11/05/2014 1418   ALKPHOS 89 11/05/2014 1418   BILITOT 1.6* 11/05/2014 1418   GFRNONAA 49* 11/06/2014 0236   GFRNONAA >60 10/30/2013 1705   GFRNONAA 65 09/11/2013 1624   GFRAA 57* 11/06/2014 0236   GFRAA >60 10/30/2013 1705   GFRAA 75 09/11/2013 1624    GFR Estimated Creatinine Clearance: 39.8 mL/min (by C-G formula based on Cr of 1.01).  Recent Labs  11/05/14 1418  LIPASE 12*    Recent Labs  11/05/14 2115 11/06/14 0236 11/06/14 0838  TROPONINI 0.03 0.04* 0.04*   Invalid input(s): POCBNP No results for input(s): DDIMER in the last 72 hours. No results for input(s): HGBA1C in the last 72 hours. No results for input(s): CHOL, HDL, LDLCALC, TRIG, CHOLHDL, LDLDIRECT in the last 72 hours. No results for input(s): TSH, T4TOTAL, T3FREE, THYROIDAB in the last 72 hours.  Invalid input(s): FREET3 No results for input(s): VITAMINB12, FOLATE, FERRITIN, TIBC, IRON, RETICCTPCT in the last 72 hours. Coags:  Recent Labs  11/05/14 1418  INR 1.13   Microbiology: Recent Results (from the past 240 hour(s))  Culture, blood (routine x 2)     Status: None (Preliminary result)   Collection Time: 11/05/14  2:15 PM  Result Value Ref Range Status   Specimen Description BLOOD LAC  Final   Special Requests BOTTLES DRAWN AEROBIC AND ANAEROBIC  Final   Culture   Final    NO GROWTH 2 DAYS Performed at [facility]    Report Status PENDING  Incomplete  Culture, blood (routine x 2)      Status: None (Preliminary result)   Collection Time: 11/05/14  2:15 PM  Result Value Ref Range Status   Specimen Description BLOOD LEFT WRIST  Final   Special Requests BOTTLES DRAWN AEROBIC AND ANAEROBIC 4CC  Final   Culture   Final    NO GROWTH 2 DAYS Performed at [facility]    Report Status PENDING  Incomplete  MRSA PCR Screening     Status: None   Collection Time: 11/06/14  7:50 AM  Result Value Ref Range Status   MRSA by PCR NEGATIVE NEGATIVE Final    Comment:        The GeneXpert MRSA Assay (FDA approved  for NASAL specimens only), is one component of a comprehensive MRSA colonization surveillance program. It is not intended to diagnose MRSA infection nor to guide or monitor treatment for MRSA infections.      BRIEF HOSPITAL COURSE:  Vomiting without nausea, intractable:Suspect secondary to viral syndrome.CT abdomen without any obstruction. Her abdomen exam is benign. Resolved with supportive measures, diet was slowly advanced to a regular diet which he is tolerating at discharge.  Neck pain: Significantly improved with supportive care with Tylenol and as needed muscle relaxants   Chronic atrial fibrillation:. Continue with the metoprolol and amiodarone. Reviewed prior cardiology outpatient notes, not a anticoagulation candidate.   Chronic respiratory failure: On 5 L of oxygen at home-continue   Chronic diastolic heart failure: Clinically compensated-continue Lasix  Hypertrophic obstructive cardiomyopathy: Continue beta blocker.   Pulmonary hypertension, secondary: Probably secondary to COPD and CHF. On 5 L of oxygen at home at all times.  COPD: Lungs currently clear. Continue with as needed bronchodilators    TODAY-DAY OF DISCHARGE:  Subjective:   Patricia [name] today has no headache,no chest abdominal pain,no new weakness tingling or numbness, feels much better wants to go home today.   Objective:   Blood pressure 148/76, pulse 71, temperature  97.9 F (36.6 C), temperature source Oral, resp. rate 18, height 5\' 4"  (1.626 m), weight 72.6 kg (160 lb 0.9 oz), SpO2 91 %.  Intake/Output Summary (Last 24 hours) at 11/08/14 1105 Last data filed at 11/08/14 0921  Gross per 24 hour  Intake    903 ml  Output   2050 ml  Net  -1147 ml   Filed Weights   11/05/14 1914  Weight: 72.6 kg (160 lb 0.9 oz)    Exam Awake Alert, Oriented *3, No new F.N deficits, Normal affect [place].AT,PERRAL Supple Neck,No JVD, No cervical lymphadenopathy appriciated.  Symmetrical Chest wall movement, Good air movement bilaterally, CTAB RRR,No Gallops,Rubs or new Murmurs, No Parasternal Heave +ve B.Sounds, Abd Soft, Non tender, No organomegaly appriciated, No rebound -guarding or rigidity. No Cyanosis, Clubbing or edema, No new Rash or bruise  DISCHARGE CONDITION: Stable  DISPOSITION: ALF with physical therapy services  DISCHARGE INSTRUCTIONS:    Activity:  As tolerated with Full fall precautions use walker/cane & assistance as needed  Diet recommendation: Heart Healthy diet  Discharge Instructions    Call MD for:  persistant nausea and vomiting    Complete by:  As directed      Diet - low sodium heart healthy    Complete by:  As directed      Increase activity slowly    Complete by:  As directed            Follow-up Information    Follow up with Londell Moh, MD. Schedule an appointment as soon as possible for a visit in 1 week.   Specialty:  Internal Medicine   Contact information:   [address] [place] [address] [place] [address] [phone]       Total Time spent on discharge equals  45 minutes.  SignedJeoffrey Massed 11/08/2014 11:05 AM

## 2014-11-08 NOTE — Progress Notes (Signed)
Patient is set to discharge back to [facility] today. CSW confirmed with Marylene Land at ALF that patient can return to them. Patient & daughters at bedside aware. Discharge packet given to RN, Irfat. Daughters to transport to ALF.     Lincoln Maxin, LCSW [facility] Clinical Social Worker cell #: [phone]

## 2014-11-10 LAB — CULTURE, BLOOD (ROUTINE X 2)
CULTURE: NO GROWTH
Culture: NO GROWTH

## 2014-11-26 ENCOUNTER — Other Ambulatory Visit: Payer: Self-pay

## 2014-11-26 ENCOUNTER — Other Ambulatory Visit: Payer: Self-pay | Admitting: Internal Medicine

## 2014-11-26 MED ORDER — FUROSEMIDE 20 MG PO TABS: 20.0000 mg | ORAL_TABLET | Freq: Two times a day (BID) | ORAL | Status: DC

## 2014-12-29 ENCOUNTER — Other Ambulatory Visit: Payer: Self-pay | Admitting: Internal Medicine

## 2015-01-26 ENCOUNTER — Other Ambulatory Visit: Payer: Self-pay

## 2015-01-26 ENCOUNTER — Other Ambulatory Visit: Payer: Self-pay | Admitting: Internal Medicine

## 2015-01-26 MED ORDER — FUROSEMIDE 20 MG PO TABS: 20.0000 mg | ORAL_TABLET | Freq: Two times a day (BID) | ORAL | Status: DC

## 2015-01-26 MED ORDER — POTASSIUM CHLORIDE CRYS ER 20 MEQ PO TBCR: 20.0000 meq | EXTENDED_RELEASE_TABLET | Freq: Two times a day (BID) | ORAL | Status: AC

## 2015-01-26 NOTE — Telephone Encounter (Signed)
Pt's daughter Rosey Bath called states she was told at the pharmacy pt needed appt with Dr Tenny Craw for future refills on KCL.  Pt's daughter is unsure if pt is due for follow-up with Dr Tenny Craw in the clinic, pt is not presently having any problems and it is difficult to get pt into office for appts, but she is willing to schedule f/u appt with Dr Tenny Craw if pt is due to follow-up.  Pt is now residing at [facility].  Please advise if pt needs to schedule f/u appt with Dr Tenny Craw, and will need rx refill for KCL to be sent to [facility] as she is running out of this medication.  Thanks, please call pt's daughter Rosey Bath to advise.

## 2015-01-26 NOTE — Telephone Encounter (Signed)
Spoke with patient's daughter, Rosey Bath. Informed reordered the potassium 20 meq twice daily. Also reordered lasix 20 mg twice daily.  Advised that patient is due for annual follow up next month.  She reports patient is not experiencing any new problems and so she will call back to schedule appointment for Dr. Tenny Craw next available. (November).

## 2015-02-17 ENCOUNTER — Other Ambulatory Visit: Payer: Self-pay | Admitting: *Deleted

## 2015-02-20 ENCOUNTER — Observation Stay (HOSPITAL_COMMUNITY)
Admission: EM | Admit: 2015-02-20 | Discharge: 2015-02-22 | Disposition: A | Discharge status: Home / Self Care | Payer: Medicare Other | Attending: Internal Medicine | Admitting: Internal Medicine

## 2015-02-20 ENCOUNTER — Encounter (HOSPITAL_COMMUNITY): Payer: Self-pay | Admitting: Emergency Medicine

## 2015-02-20 ENCOUNTER — Emergency Department (HOSPITAL_COMMUNITY): Payer: Medicare Other

## 2015-02-20 DIAGNOSIS — Z953 Presence of xenogenic heart valve: Secondary | ICD-10-CM

## 2015-02-20 DIAGNOSIS — Z87891 Personal history of nicotine dependence: Secondary | ICD-10-CM | POA: Diagnosis not present

## 2015-02-20 DIAGNOSIS — Z23 Encounter for immunization: Secondary | ICD-10-CM | POA: Diagnosis not present

## 2015-02-20 DIAGNOSIS — I482 Chronic atrial fibrillation, unspecified: Secondary | ICD-10-CM | POA: Diagnosis present

## 2015-02-20 DIAGNOSIS — E039 Hypothyroidism, unspecified: Secondary | ICD-10-CM | POA: Diagnosis not present

## 2015-02-20 DIAGNOSIS — W19XXXA Unspecified fall, initial encounter: Secondary | ICD-10-CM | POA: Insufficient documentation

## 2015-02-20 DIAGNOSIS — Y998 Other external cause status: Secondary | ICD-10-CM | POA: Insufficient documentation

## 2015-02-20 DIAGNOSIS — R296 Repeated falls: Secondary | ICD-10-CM | POA: Diagnosis not present

## 2015-02-20 DIAGNOSIS — Z888 Allergy status to other drugs, medicaments and biological substances status: Secondary | ICD-10-CM | POA: Insufficient documentation

## 2015-02-20 DIAGNOSIS — S0093XA Contusion of unspecified part of head, initial encounter: Secondary | ICD-10-CM | POA: Insufficient documentation

## 2015-02-20 DIAGNOSIS — L8990 Pressure ulcer of unspecified site, unspecified stage: Secondary | ICD-10-CM | POA: Diagnosis present

## 2015-02-20 DIAGNOSIS — J449 Chronic obstructive pulmonary disease, unspecified: Secondary | ICD-10-CM | POA: Diagnosis present

## 2015-02-20 DIAGNOSIS — R55 Syncope and collapse: Principal | ICD-10-CM | POA: Diagnosis present

## 2015-02-20 DIAGNOSIS — E785 Hyperlipidemia, unspecified: Secondary | ICD-10-CM | POA: Diagnosis not present

## 2015-02-20 DIAGNOSIS — Z515 Encounter for palliative care: Secondary | ICD-10-CM | POA: Diagnosis not present

## 2015-02-20 DIAGNOSIS — J9610 Chronic respiratory failure, unspecified whether with hypoxia or hypercapnia: Secondary | ICD-10-CM | POA: Diagnosis present

## 2015-02-20 DIAGNOSIS — I2510 Atherosclerotic heart disease of native coronary artery without angina pectoris: Secondary | ICD-10-CM | POA: Diagnosis not present

## 2015-02-20 DIAGNOSIS — Z9981 Dependence on supplemental oxygen: Secondary | ICD-10-CM | POA: Insufficient documentation

## 2015-02-20 DIAGNOSIS — Z66 Do not resuscitate: Secondary | ICD-10-CM | POA: Diagnosis not present

## 2015-02-20 DIAGNOSIS — Y9289 Other specified places as the place of occurrence of the external cause: Secondary | ICD-10-CM | POA: Insufficient documentation

## 2015-02-20 DIAGNOSIS — I7101 Dissection of thoracic aorta: Secondary | ICD-10-CM | POA: Diagnosis present

## 2015-02-20 DIAGNOSIS — R0902 Hypoxemia: Secondary | ICD-10-CM

## 2015-02-20 DIAGNOSIS — I272 Other secondary pulmonary hypertension: Secondary | ICD-10-CM | POA: Insufficient documentation

## 2015-02-20 DIAGNOSIS — Y9389 Activity, other specified: Secondary | ICD-10-CM | POA: Insufficient documentation

## 2015-02-20 DIAGNOSIS — I71012 Dissection of descending thoracic aorta: Secondary | ICD-10-CM | POA: Diagnosis present

## 2015-02-20 DIAGNOSIS — S0990XA Unspecified injury of head, initial encounter: Secondary | ICD-10-CM | POA: Diagnosis not present

## 2015-02-20 DIAGNOSIS — G4733 Obstructive sleep apnea (adult) (pediatric): Secondary | ICD-10-CM | POA: Diagnosis not present

## 2015-02-20 DIAGNOSIS — I10 Essential (primary) hypertension: Secondary | ICD-10-CM | POA: Insufficient documentation

## 2015-02-20 DIAGNOSIS — I5032 Chronic diastolic (congestive) heart failure: Secondary | ICD-10-CM | POA: Diagnosis not present

## 2015-02-20 DIAGNOSIS — I421 Obstructive hypertrophic cardiomyopathy: Secondary | ICD-10-CM | POA: Diagnosis present

## 2015-02-20 DIAGNOSIS — IMO0002 Reserved for concepts with insufficient information to code with codable children: Secondary | ICD-10-CM | POA: Diagnosis present

## 2015-02-20 DIAGNOSIS — Z91041 Radiographic dye allergy status: Secondary | ICD-10-CM | POA: Diagnosis not present

## 2015-02-20 DIAGNOSIS — I4581 Long QT syndrome: Secondary | ICD-10-CM | POA: Diagnosis not present

## 2015-02-20 DIAGNOSIS — R9431 Abnormal electrocardiogram [ECG] [EKG]: Secondary | ICD-10-CM | POA: Diagnosis present

## 2015-02-20 DIAGNOSIS — Z952 Presence of prosthetic heart valve: Secondary | ICD-10-CM

## 2015-02-20 DIAGNOSIS — S0003XA Contusion of scalp, initial encounter: Secondary | ICD-10-CM

## 2015-02-20 LAB — CBC WITH DIFFERENTIAL/PLATELET
Basophils Absolute: 0 10*3/uL (ref 0.0–0.1)
Basophils Relative: 0 %
Eosinophils Absolute: 0.7 10*3/uL (ref 0.0–0.7)
Eosinophils Relative: 7 %
HCT: 40.3 % (ref 36.0–46.0)
Hemoglobin: 12.8 g/dL (ref 12.0–15.0)
Lymphocytes Relative: 8 %
Lymphs Abs: 0.8 10*3/uL (ref 0.7–4.0)
MCH: 31 pg (ref 26.0–34.0)
MCHC: 31.8 g/dL (ref 30.0–36.0)
MCV: 97.6 fL (ref 78.0–100.0)
Monocytes Absolute: 0.5 10*3/uL (ref 0.1–1.0)
Monocytes Relative: 5 %
Neutro Abs: 7.6 10*3/uL (ref 1.7–7.7)
Neutrophils Relative %: 79 %
Platelets: 98 10*3/uL — ABNORMAL LOW (ref 150–400)
RBC: 4.13 MIL/uL (ref 3.87–5.11)
RDW: 14.3 % (ref 11.5–15.5)
WBC: 9.6 10*3/uL (ref 4.0–10.5)

## 2015-02-20 LAB — BASIC METABOLIC PANEL
Anion gap: 9 (ref 5–15)
BUN: 27 mg/dL — ABNORMAL HIGH (ref 6–20)
CO2: 30 mmol/L (ref 22–32)
Calcium: 9.2 mg/dL (ref 8.9–10.3)
Chloride: 101 mmol/L (ref 101–111)
Creatinine, Ser: 1.21 mg/dL — ABNORMAL HIGH (ref 0.44–1.00)
GFR calc Af Amer: 46 mL/min — ABNORMAL LOW (ref >60)
GFR calc non Af Amer: 40 mL/min — ABNORMAL LOW (ref >60)
Glucose, Bld: 114 mg/dL — ABNORMAL HIGH (ref 65–99)
Potassium: 4.1 mmol/L (ref 3.5–5.1)
Sodium: 140 mmol/L (ref 135–145)

## 2015-02-20 LAB — TROPONIN I: Troponin I: 0.03 ng/mL (ref <0.031)

## 2015-02-20 MED ORDER — SODIUM CHLORIDE 0.9 % IV BOLUS (SEPSIS): 500.0000 mL | Freq: Once | INTRAVENOUS | Status: DC

## 2015-02-20 MED ORDER — SODIUM CHLORIDE 0.9 % IV BOLUS (SEPSIS)
500.0000 mL | Freq: Once | INTRAVENOUS | Status: AC
Start: 2015-02-20 — End: 2015-02-21
  Administered 2015-02-20: 500 mL via INTRAVENOUS

## 2015-02-20 NOTE — ED Notes (Signed)
Gauze place to abrasion on rt side of head, wrapped with kerlix

## 2015-02-20 NOTE — ED Notes (Signed)
Wound irrigated, MD at bedside for re evaluation. Family at bedside.

## 2015-02-20 NOTE — H&P (Signed)
PCP:  Londell Moh, MD  Cardiology [facility] Dr. Delton Coombes  Referring provider [place]   Chief Complaint: Fall while at the nursing facility followed by syncopal event in the ER  HPI: Patricia [name] is a 81 y.o. female   has a past medical history of CAD (coronary artery disease); Mitral valve disorder; HTN (hypertension); Dyslipidemia; Aortic stenosis, mild; Breast cyst; Hypertrophic cardiomyopathy (HCC); Sleep apnea; COPD (chronic obstructive pulmonary disease) (HCC); Atrial fibrillation (HCC); Hypothyroidism; Anemia; and Chronic right-sided heart failure (HCC).   Presented with patient apparently had a fall while in the dining room. She reports feeling "funny" a bit before. Report having a sensation that she was low on oxygen but reports using her oxygen properly.  Patient has had recently frequent falls. Fall resulted in the right side hematoma of the head. On arrival to emergency department CT scan of the head showed large right but no intracranial bleeding. Her labs showed slightly increased creatinine up to 1.2 from baseline of 1.0 from June. Her troponin was within normal limits.  Patient has known history of chronic atrial fibrillation MVR and significant pulmonary hypertension. She has history of COPD and a baseline of 5 L of oxygen. She has known type B aortic dissection as has been managed nonoperatively. Patient was ready for discharge and then had a syncopal event which was   witnessed by nursing staff. It was noted that she became desaturated down to 83% during this event. Family states this happens often Patient reports feeling some presyncope prior to event. Apparently per records that have occurred shortly after she has urinated.  She denies any chest pain. Family reports that despite being on 5 L of O2 she continued to have hypoxic events especially with activity. Patient states she has  CPAP but does not want to use it.  Of note patient's normal weight is 150  pounds today her weight is up 160 pounds.  Last echogram was in February 2015 showing EF of 55-60% but did show severe pulmonary hypertension. Patient has been seen in the past by cardiology if complains of recurrent syncope thought to be secondary to her multiple cardiac problems as well as pulmonary hypertension. At some point patient apparently was on hospice. Current inpatient atrial fibrillation and this has been controlled with amiodarone for rate control she is not a current anticoagulation candidate secondary to repeated falls. Examination has known history of mitral valve disease status post mitral valve replacement with bioprosthetic mouth there are note from cardiology not a candidate for intervention. She has history of dissecting aortic aneurysm also foot to be not a candidate for any operative intervention might be medical management only. Of note she has known hx of HOCM Hospitalist was called for admission for syncope  Review of Systems:    Pertinent positives include: Frequent falls, syncope  Constitutional:  No weight loss, night sweats, Fevers, chills, fatigue, weight loss  HEENT:  No headaches, Difficulty swallowing,Tooth/dental problems,Sore throat,  No sneezing, itching, ear ache, nasal congestion, post nasal drip,  Cardio-vascular:  No chest pain, Orthopnea, PND, anasarca, dizziness, palpitations.no Bilateral lower extremity swelling  GI:  No heartburn, indigestion, abdominal pain, nausea, vomiting, diarrhea, change in bowel habits, loss of appetite, melena, blood in stool, hematemesis Resp:  no shortness of breath at rest. No dyspnea on exertion, No excess mucus, no productive cough, No non-productive cough, No coughing up of blood.No change in color of mucus.No wheezing. Skin:  no rash or lesions. No jaundice GU:  no  dysuria, change in color of urine, no urgency or frequency. No straining to urinate.  No flank pain.  Musculoskeletal:  No joint pain or no joint  swelling. No decreased range of motion. No back pain.  Psych:  No change in mood or affect. No depression or anxiety. No memory loss.  Neuro: no localizing neurological complaints, no tingling, no weakness, no double vision, no gait abnormality, no slurred speech, no confusion  Otherwise ROS are negative except for above, 10 systems were reviewed  Past Medical History: Past Medical History  Diagnosis Date  . CAD (coronary artery disease)   . Mitral valve disorder     a. s/p MVR;  b. 06/2013 Echo: EF 55-60%, no rwma, Ao sclerosis, mild AI, mild MR, miod dil LA, sev dil RV with reduced fxn, sev dil RA, mod TR/PR, PASP (study reviewed by Dr. Verlin Fester- MS felt to be sev, peak grad 39/mean 13).  [name] HTN (hypertension)   . Dyslipidemia   . Aortic stenosis, mild   . Breast cyst   . Hypertrophic cardiomyopathy (HCC)   . Sleep apnea   . COPD (chronic obstructive pulmonary disease) (HCC)     "a touch of"; prn o2 at home with activity   . Atrial fibrillation (HCC)     a. chronic coumadin.  [name] Hypothyroidism   . Anemia   . Chronic right-sided heart failure [facility])    Past Surgical History  Procedure Laterality Date  . Laparoscopic cholecystectomy  2009  . Cataract extraction  2003    bilateral  . Mitral valve replacement  2007  . Neck mass excision  2003    benign  . Colonoscopy    . Broken wrist      left, metal implanted   . Broken ankle      right, metal implanted   . Cardiac catheterization  06/2011  . Breast surgery      bilateral cysts x3 removed, benign   . Tee without cardioversion  01/31/2012    Procedure: TRANSESOPHAGEAL ECHOCARDIOGRAM (TEE);  Surgeon: Pricilla Riffle, MD;  Location: [facility] ENDOSCOPY;  Service: Cardiovascular;  Laterality: N/A;  . Cardioversion N/A 01/16/2013    Procedure: CARDIOVERSION;  Surgeon: Vesta Mixer, MD;  Location: [facility] ENDOSCOPY;  Service: Cardiovascular;  Laterality: N/A;     Medications: Prior to Admission medications   Medication Sig Start Date End  Date Taking? Authorizing Provider  acetaminophen (TYLENOL) 325 MG tablet Take 2 tablets (650 mg total) by mouth every 6 (six) hours as needed for mild pain (or Fever >/= 101). 11/08/14  Yes Shanker Levora Dredge, MD  albuterol (PROVENTIL HFA;VENTOLIN HFA) 108 (90 BASE) MCG/ACT inhaler Inhale 2 puffs into the lungs every 6 (six) hours as needed for wheezing or shortness of breath. 11/08/14  Yes Shanker Levora Dredge, MD  amiodarone (PACERONE) 200 MG tablet Take 0.5 tablets (100 mg total) by mouth daily. 07/08/14  Yes Pricilla Riffle, MD  Calcium Carb-Cholecalciferol (CALCIUM + D3) 600-200 MG-UNIT TABS Take 1 tablet by mouth 2 (two) times daily.   Yes Historical Provider, MD  cholecalciferol (VITAMIN D) 1000 UNITS tablet Take 1,000 Units by mouth at bedtime.    Yes Historical Provider, MD  furosemide (LASIX) 20 MG tablet Take 1 tablet (20 mg total) by mouth 2 (two) times daily. 01/26/15  Yes Pricilla Riffle, MD  levothyroxine (SYNTHROID, LEVOTHROID) 125 MCG tablet Take 62.5 mcg by mouth daily before breakfast.   Yes Historical Provider, MD  LORazepam (ATIVAN) 0.5 MG tablet  Take 2 tablets (1 mg total) by mouth every 2 (two) hours as needed for anxiety or seizure. 11/08/14  Yes Shanker Levora Dredge, MD  metoprolol tartrate (LOPRESSOR) 25 MG tablet Take 1 tablet (25 mg total) by mouth 2 (two) times daily. 09/21/13  Yes Pricilla Riffle, MD  nitroGLYCERIN (NITROSTAT) 0.4 MG SL tablet Place 0.4 mg under the tongue every 5 (five) minutes as needed for chest pain.   Yes Historical Provider, MD  ondansetron (ZOFRAN) 4 MG tablet Take 4 mg by mouth every 8 (eight) hours as needed for nausea or vomiting.   Yes Historical Provider, MD  potassium chloride SA (K-DUR,KLOR-CON) 20 MEQ tablet Take 1 tablet (20 mEq total) by mouth 2 (two) times daily. 01/26/15  Yes Pricilla Riffle, MD  Psyllium (FIBER) 0.52 G CAPS Take 2 capsules by mouth at bedtime.   Yes Historical Provider, MD  sertraline (ZOLOFT) 100 MG tablet Take 100 mg by mouth daily.   Yes  Historical Provider, MD  calcitonin, salmon, (MIACALCIN/FORTICAL) 200 UNIT/ACT nasal spray ONE SPRAY IN ONE NOSTRIL DAILY ALTERNATE NOSTRILS DAILY Patient not taking: Reported on 11/05/2014 06/10/14   Pricilla Riffle, MD  cyclobenzaprine (FLEXERIL) 5 MG tablet Take 1 tablet (5 mg total) by mouth 3 (three) times daily as needed for muscle spasms. Patient not taking: Reported on 02/20/2015 11/08/14   Maretta Bees, MD    Allergies:   Allergies  Allergen Reactions  . Ivp Dye [Iodinated Diagnostic Agents] Itching  . Nitrofurantoin Itching  . Ramipril Other (See Comments)    unknown    Social History:  Ambulatory  independently From facility [facility] center   reports that she quit smoking about 24 years ago. Her smoking use included Cigarettes. She has a 40 pack-year smoking history. She has never used smokeless tobacco. She reports that she does not drink alcohol or use illicit drugs.    Family History: family history includes Aneurysm in her brother; Breast cancer in her sister; CVA in her mother; Cancer in her brother; Diabetes in her brother and mother; Emphysema in her sister; Other in her father.    Physical Exam: Patient Vitals for the past 24 hrs:  BP Pulse Resp SpO2  02/20/15 2345 - 78 20 94 %  02/20/15 2330 132/81 mmHg 72 17 95 %  02/20/15 2315 131/93 mmHg 96 20 94 %  02/20/15 2300 136/67 mmHg 76 19 95 %  02/20/15 2245 133/80 mmHg 65 18 96 %  02/20/15 2230 117/76 mmHg 62 20 97 %  02/20/15 2215 139/85 mmHg 80 22 94 %  02/20/15 2205 124/70 mmHg 75 21 94 %  02/20/15 2200 124/70 mmHg 92 - 94 %  02/20/15 2145 135/83 mmHg 112 - (!) 84 %  02/20/15 1949 126/88 mmHg 78 18 94 %    1. General:  in No Acute distress 2. Psychological: Alert and   Oriented 3. Head/ENT:   Moist   Mucous Membranes                          Head Non traumatic, neck supple                          Normal  Dentition 4. SKIN: normal  Skin turgor,  Skin clean Dry and intact no rash 5. Heart:  Regular rate and rhythm no Murmur, Rub or gallop 6. Lungs: Clear to auscultation bilaterally, no wheezes or crackles  7. Abdomen: Soft, non-tender, Non distended 8. Lower extremities: no clubbing, cyanosis, or edema 9. Neurologically Grossly intact, moving all 4 extremities equally 10. MSK: Normal range of motion  body mass index is unknown because there is no weight on file.   Labs on Admission:   Results for orders placed or performed during the hospital encounter of 02/20/15 (from the past 24 hour(s))  CBC with Differential     Status: Abnormal   Collection Time: 02/20/15 10:35 PM  Result Value Ref Range   WBC 9.6 4.0 - 10.5 K/uL   RBC 4.13 3.87 - 5.11 MIL/uL   Hemoglobin 12.8 12.0 - 15.0 g/dL   HCT 21.3 08.6 - 57.8 %   MCV 97.6 78.0 - 100.0 fL   MCH 31.0 26.0 - 34.0 pg   MCHC 31.8 30.0 - 36.0 g/dL   RDW 46.9 62.9 - 52.8 %   Platelets 98 (L) 150 - 400 K/uL   Neutrophils Relative % 79 %   Neutro Abs 7.6 1.7 - 7.7 K/uL   Lymphocytes Relative 8 %   Lymphs Abs 0.8 0.7 - 4.0 K/uL   Monocytes Relative 5 %   Monocytes Absolute 0.5 0.1 - 1.0 K/uL   Eosinophils Relative 7 %   Eosinophils Absolute 0.7 0.0 - 0.7 K/uL   Basophils Relative 0 %   Basophils Absolute 0.0 0.0 - 0.1 K/uL  Basic metabolic panel     Status: Abnormal   Collection Time: 02/20/15 10:35 PM  Result Value Ref Range   Sodium 140 135 - 145 mmol/L   Potassium 4.1 3.5 - 5.1 mmol/L   Chloride 101 101 - 111 mmol/L   CO2 30 22 - 32 mmol/L   Glucose, Bld 114 (H) 65 - 99 mg/dL   BUN 27 (H) 6 - 20 mg/dL   Creatinine, Ser 4.13 (H) 0.44 - 1.00 mg/dL   Calcium 9.2 8.9 - 24.4 mg/dL   GFR calc non Af Amer 40 (L) >60 mL/min   GFR calc Af Amer 46 (L) >60 mL/min   Anion gap 9 5 - 15  Troponin I     Status: None   Collection Time: 02/20/15 10:35 PM  Result Value Ref Range   Troponin I 0.03 <0.031 ng/mL    UA odered  No results found for: HGBA1C  CrCl cannot be calculated (Unknown ideal weight.).  BNP (last 3  results)  Recent Labs  03/07/14 1356  PROBNP 4865.0*    Other results:  I have pearsonaly reviewed this: ECG REPORT  Rate: 76  Rhythm: A.fib with intraventricular conduction delay  ST&T Change: St depressions in Lateral leads in a past similar EKG was determined to be a left bundle-branch block QTC prolonged at 552 which is chronic   There were no vitals filed for this visit.   Cultures:    Component Value Date/Time   SDES BLOOD LAC 11/05/2014 1415   SDES BLOOD LEFT WRIST 11/05/2014 1415   SPECREQUEST BOTTLES DRAWN AEROBIC AND ANAEROBIC 11/05/2014 1415   SPECREQUEST BOTTLES DRAWN AEROBIC AND ANAEROBIC 4CC 11/05/2014 1415   CULT  11/05/2014 1415    NO GROWTH 5 DAYS Performed at [facility]    CULT  11/05/2014 1415    NO GROWTH 5 DAYS Performed at [facility]    REPTSTATUS 11/10/2014 FINAL 11/05/2014 1415   REPTSTATUS 11/10/2014 FINAL 11/05/2014 1415     Radiological Exams on Admission: Ct Head Wo Contrast  02/20/2015   CLINICAL DATA:  Assisted living patient  with unobserved fall. No reported loss of consciousness or current neurologic deficit.  EXAM: CT HEAD WITHOUT CONTRAST  TECHNIQUE: Contiguous axial images were obtained from the base of the skull through the vertex without intravenous contrast.  COMPARISON:  02/13/2014.  FINDINGS: No evidence for acute infarction, hemorrhage, mass lesion, hydrocephalus, or extra-axial fluid. Generalized atrophy. Chronic microvascular ischemic change. Remote LEFT inferior cerebellar infarct.  Large RIGHT frontoparietal scalp hematoma. No underlying skull fracture. No subarachnoid or subdural blood.  No acute sinus fluid. No mastoid fluid. Carotid siphon vascular calcification. BILATERAL cataract extraction. Aside from the scalp hematoma, no change from priors.  IMPRESSION: Chronic changes as described.  Large RIGHT frontoparietal scalp hematoma. No underlying skull fracture or intracranial hemorrhage.   Electronically  Signed   By: Elsie Stain M.D.   On: 02/20/2015 20:17    Chart has been reviewed  Family  at  Bedside  plan of care was discussed with  Daughter Galen Daft [phone], other [phone]  Assessment/Plan   81 year old female with multiple medical problems including hypertrophic cardiomyopathy, atrial fibrillation not a candidate for anticoagulation, type B aortic dissection not a candidate for repair, mitral valve stenosis is not a candidate for repair, history of diastolic heart failure on mild Lasix as well as history severe pulmonary hypertension on 5 L of oxygen at home and history of COPD. Patient has had frequent falls as well as frequent syncopal events. She had an unwitnessed fall today resulting in large frontal hematoma and then had a recurrent syncope while in emergency department. Patient is being admitted for observation  Present on Admission:  . Syncope this apparently is a recurrent problem likely secondary to patient's multiple cardiac medical problems. Reviewing cardiology notes patient is not a candidate for much of any interventions. We'll check orthostatics to look for any reversible causes. Will repeat echo, cycle CE . Patient and family would like conservative managemet . Hypertrophic obstructive cardiomyopathy (HCC) continue call home medications  . Chronic atrial fibrillation (HCC) not a candidate for anticoagulation currently rate controlled  . COPD, mild (HCC) continue oxygen inhalers as needed  . Pulmonary hypertension, secondary (HCC)- continue oxygen 6 end-stage  . OSA (obstructive sleep apnea) patient refuses to use a Pap machine  . Chronic diastolic heart failure (HCC) coronary per his to be somewhat fluid down given increase in creatinine. We'll hold overnight Lasix will defer to cardiology if they would like to continue Lasix and at what dose  . Prolonged QT interval this is apparently chronic continue to monitor  Prophylaxis: SCD   CODE STATUS:     DNR/DNI as per patient currently active on hospice for CHF  Disposition:                           Back to current facility when stable                           Other plan as per orders.  I have spent a total of 55 min on this admission  Eagle Pitta 02/20/2015, 11:46 PM  [facility]  Pager [phone]   after 2 AM please page floor coverage PA If 7AM-7PM, please contact the day team taking care of the patient  [email or web address]  Password [email or web address]

## 2015-02-20 NOTE — ED Provider Notes (Signed)
CSN: [id number]     Arrival date & time 02/20/15  1739 History   First MD Initiated Contact with Patient 02/20/15 1802     Chief Complaint  Patient presents with  . Fall     (Consider location/radiation/quality/duration/timing/severity/associated sxs/prior Treatment) HPI   81 y.o. female with a history of HOCM, Chronic Afib, mitral stenosis s/p MVR, mild CAD, secondary pulm HTN, chronic resp failure on home O2, Mild COPD and Type B aortic dissection ( managed non operatively) presenting from [facility] for evaluation after fall. Previously on coumadin but since discontinued over a year ago. Was going to dining hall when she fell. She is not completely sure what may have happened. Did strike head. Possible brief LOC. Mild pain at area of head she struck. Denies any significant pain elsewhere. Doesn't recall preceding symptoms before she fell and none currently. Was admitted a year ago with possible syncope when fell and sustained orbital floor fx. Has had intermittent diplopia since then. Followed by opthalomology. No acute neurological complaints today.   Past Medical History  Diagnosis Date  . CAD (coronary artery disease)   . Mitral valve disorder     a. s/p MVR;  b. 06/2013 Echo: EF 55-60%, no rwma, Ao sclerosis, mild AI, mild MR, miod dil LA, sev dil RV with reduced fxn, sev dil RA, mod TR/PR, PASP (study reviewed by Dr. Verlin Fester- MS felt to be sev, peak grad 39/mean 13).  [name] HTN (hypertension)   . Dyslipidemia   . Aortic stenosis, mild   . Breast cyst   . Hypertrophic cardiomyopathy (HCC)   . Sleep apnea   . COPD (chronic obstructive pulmonary disease) (HCC)     "a touch of"; prn o2 at home with activity   . Atrial fibrillation (HCC)     a. chronic coumadin.  [name] Hypothyroidism   . Anemia   . Chronic right-sided heart failure [facility])    Past Surgical History  Procedure Laterality Date  . Laparoscopic cholecystectomy  2009  . Cataract extraction  2003    bilateral  . Mitral valve  replacement  2007  . Neck mass excision  2003    benign  . Colonoscopy    . Broken wrist      left, metal implanted   . Broken ankle      right, metal implanted   . Cardiac catheterization  06/2011  . Breast surgery      bilateral cysts x3 removed, benign   . Tee without cardioversion  01/31/2012    Procedure: TRANSESOPHAGEAL ECHOCARDIOGRAM (TEE);  Surgeon: Pricilla Riffle, MD;  Location: [facility] ENDOSCOPY;  Service: Cardiovascular;  Laterality: N/A;  . Cardioversion N/A 01/16/2013    Procedure: CARDIOVERSION;  Surgeon: Vesta Mixer, MD;  Location: [facility] ENDOSCOPY;  Service: Cardiovascular;  Laterality: N/A;   Family History  Problem Relation Age of Onset  . Diabetes Mother   . CVA Mother   . Other Father   . Diabetes Brother   . Cancer Brother     throat ca, smoker  . Aneurysm Brother     brain aneurysm  . Breast cancer Sister   . Emphysema Sister     smoker   Social History  Substance Use Topics  . Smoking status: Former Smoker -- 1.00 packs/day for 40 years    Types: Cigarettes    Quit date: 05/21/1990  . Smokeless tobacco: Never Used  . Alcohol Use: No   OB History    No data available  Review of Systems  All systems reviewed and negative, other than as noted in HPI.   Allergies  Ivp dye; Nitrofurantoin; and Ramipril  Home Medications   Prior to Admission medications   Medication Sig Start Date End Date Taking? Authorizing Provider  acetaminophen (TYLENOL) 325 MG tablet Take 2 tablets (650 mg total) by mouth every 6 (six) hours as needed for mild pain (or Fever >/= 101). 11/08/14  Yes Shanker Levora Dredge, MD  albuterol (PROVENTIL HFA;VENTOLIN HFA) 108 (90 BASE) MCG/ACT inhaler Inhale 2 puffs into the lungs every 6 (six) hours as needed for wheezing or shortness of breath. 11/08/14  Yes Shanker Levora Dredge, MD  amiodarone (PACERONE) 200 MG tablet Take 0.5 tablets (100 mg total) by mouth daily. 07/08/14  Yes Pricilla Riffle, MD  Calcium Carb-Cholecalciferol (CALCIUM + D3)  600-200 MG-UNIT TABS Take 1 tablet by mouth 2 (two) times daily.   Yes Historical Provider, MD  cholecalciferol (VITAMIN D) 1000 UNITS tablet Take 1,000 Units by mouth at bedtime.    Yes Historical Provider, MD  furosemide (LASIX) 20 MG tablet Take 1 tablet (20 mg total) by mouth 2 (two) times daily. 01/26/15  Yes Pricilla Riffle, MD  levothyroxine (SYNTHROID, LEVOTHROID) 125 MCG tablet Take 62.5 mcg by mouth daily before breakfast.   Yes Historical Provider, MD  LORazepam (ATIVAN) 0.5 MG tablet Take 2 tablets (1 mg total) by mouth every 2 (two) hours as needed for anxiety or seizure. 11/08/14  Yes Shanker Levora Dredge, MD  metoprolol tartrate (LOPRESSOR) 25 MG tablet Take 1 tablet (25 mg total) by mouth 2 (two) times daily. 09/21/13  Yes Pricilla Riffle, MD  nitroGLYCERIN (NITROSTAT) 0.4 MG SL tablet Place 0.4 mg under the tongue every 5 (five) minutes as needed for chest pain.   Yes Historical Provider, MD  ondansetron (ZOFRAN) 4 MG tablet Take 4 mg by mouth every 8 (eight) hours as needed for nausea or vomiting.   Yes Historical Provider, MD  potassium chloride SA (K-DUR,KLOR-CON) 20 MEQ tablet Take 1 tablet (20 mEq total) by mouth 2 (two) times daily. 01/26/15  Yes Pricilla Riffle, MD  Psyllium (FIBER) 0.52 G CAPS Take 2 capsules by mouth at bedtime.   Yes Historical Provider, MD  sertraline (ZOLOFT) 100 MG tablet Take 100 mg by mouth daily.   Yes Historical Provider, MD  calcitonin, salmon, (MIACALCIN/FORTICAL) 200 UNIT/ACT nasal spray ONE SPRAY IN ONE NOSTRIL DAILY ALTERNATE NOSTRILS DAILY Patient not taking: Reported on 11/05/2014 06/10/14   Pricilla Riffle, MD  cyclobenzaprine (FLEXERIL) 5 MG tablet Take 1 tablet (5 mg total) by mouth 3 (three) times daily as needed for muscle spasms. Patient not taking: Reported on 02/20/2015 11/08/14   Maretta Bees, MD   BP 124/70 mmHg  Pulse 75  Resp 21  SpO2 94% Physical Exam  Constitutional: She is oriented to person, place, and time. She appears well-developed and  well-nourished. No distress.  HENT:  Head: Normocephalic.  Large R parietal hematoma. Abraded area but no active bleeding. Nothing requiring closure. No midline spinal tenderness.   Eyes: Conjunctivae are normal. Right eye exhibits no discharge. Left eye exhibits no discharge.  Neck: Neck supple.  Cardiovascular: Regular rhythm and normal heart sounds.  Exam reveals no gallop and no friction rub.   No murmur heard. irreg irreg  Pulmonary/Chest: Effort normal and breath sounds normal. No respiratory distress.  Abdominal: Soft. She exhibits no distension. There is no tenderness.  Musculoskeletal: She exhibits no edema or tenderness.  Neurological: She is alert and oriented to person, place, and time. No cranial nerve deficit. She exhibits normal muscle tone. Coordination normal.  Speech clear. Content appropriate. Follows commands. Strength normal all extremities. Good finger to nose b/l.   Skin: Skin is warm and dry.  Psychiatric: She has a normal mood and affect. Her behavior is normal. Thought content normal.  Nursing note and vitals reviewed.   ED Course  Procedures (including critical care time) Labs Review Labs Reviewed  CBC WITH DIFFERENTIAL/PLATELET - Abnormal; Notable for the following:    Platelets 98 (*)    All other components within normal limits  BASIC METABOLIC PANEL - Abnormal; Notable for the following:    Glucose, Bld 114 (*)    BUN 27 (*)    Creatinine, Ser 1.21 (*)    GFR calc non Af Amer 40 (*)    GFR calc Af Amer 46 (*)    All other components within normal limits  TROPONIN I    Imaging Review Ct Head Wo Contrast  02/20/2015   CLINICAL DATA:  Assisted living patient with unobserved fall. No reported loss of consciousness or current neurologic deficit.  EXAM: CT HEAD WITHOUT CONTRAST  TECHNIQUE: Contiguous axial images were obtained from the base of the skull through the vertex without intravenous contrast.  COMPARISON:  02/13/2014.  FINDINGS: No evidence for  acute infarction, hemorrhage, mass lesion, hydrocephalus, or extra-axial fluid. Generalized atrophy. Chronic microvascular ischemic change. Remote LEFT inferior cerebellar infarct.  Large RIGHT frontoparietal scalp hematoma. No underlying skull fracture. No subarachnoid or subdural blood.  No acute sinus fluid. No mastoid fluid. Carotid siphon vascular calcification. BILATERAL cataract extraction. Aside from the scalp hematoma, no change from priors.  IMPRESSION: Chronic changes as described.  Large RIGHT frontoparietal scalp hematoma. No underlying skull fracture or intracranial hemorrhage.   Electronically Signed   By: Elsie Stain M.D.   On: 02/20/2015 20:17   I have personally reviewed and evaluated these images and lab results as part of my medical decision-making.   EKG Interpretation   Date/Time:  Sunday February 20 2015 22:10:04 EDT Ventricular Rate:  76 PR Interval:    QRS Duration: 137 QT Interval:  491 QTC Calculation: 552 R Axis:   -102 Text Interpretation:  Atrial fibrillation Nonspecific IVCD with LAD  Probable lateral infarct, age indeterminate Anterior Q waves, possibly due  to LVH Nonspecific T abnormalities, lateral leads Confirmed by Juleen China  MD,  Bond Grieshop ([id number]) on 02/20/2015 10:22:16 PM      MDM   Final diagnoses:  Head injury, initial encounter  Scalp hematoma, initial encounter  Syncope and collapse    87yF with fall of unclear etiology. Nonfocal neuro exam. CT head neg. Local wound care for scalp hematoma/abrasion. Initial plan was for discharge. Prior to discharge though she had a syncopal event witnessed by nursing. Happened shortly after using bedpan. Unfortunately she had already been taken of monitor when this happened. I was able to evaluate her a few minutes later and she had no complaints. Syncope may have been related to using bathroom although does not explain event earlier. W/u fairly unremarkable. EKG does not seen significantly changed from prior. Mild  renal impairment but not far from baseline.    Raeford Razor, MD 02/20/15 [phone]

## 2015-02-20 NOTE — Discharge Instructions (Signed)
Head Injury  You have a head injury. Headaches and throwing up (vomiting) are common after a head injury. It should be easy to wake up from sleeping. Sometimes you must stay in the hospital. Most problems happen within the first 24 hours. Side effects may occur up to 7-10 days after the injury.   WHAT ARE THE TYPES OF HEAD INJURIES?  Head injuries can be as minor as a bump. Some head injuries can be more severe. More severe head injuries include:  · A jarring injury to the brain (concussion).  · A bruise of the brain (contusion). This mean there is bleeding in the brain that can cause swelling.  · A cracked skull (skull fracture).  · Bleeding in the brain that collects, clots, and forms a bump (hematoma).  WHEN SHOULD I GET HELP RIGHT AWAY?   · You are confused or sleepy.  · You cannot be woken up.  · You feel sick to your stomach (nauseous) or keep throwing up (vomiting).  · Your dizziness or unsteadiness is getting worse.  · You have very bad, lasting headaches that are not helped by medicine. Take medicines only as told by your doctor.  · You cannot use your arms or legs like normal.  · You cannot walk.  · You notice changes in the black spots in the center of the colored part of your eye (pupil).  · You have clear or bloody fluid coming from your nose or ears.  · You have trouble seeing.  During the next 24 hours after the injury, you must stay with someone who can watch you. This person should get help right away (call 911 in the [place] if you start to shake and are not able to control it (have seizures), you pass out, or you are unable to wake up.  HOW CAN I PREVENT A HEAD INJURY IN THE FUTURE?  · Wear seat belts.  · Wear a helmet while bike riding and playing sports like football.  · Stay away from dangerous activities around the house.  WHEN CAN I RETURN TO NORMAL ACTIVITIES AND ATHLETICS?  See your doctor before doing these activities. You should not do normal activities or play contact sports until 1 week  after the following symptoms have stopped:  · Headache that does not go away.  · Dizziness.  · Poor attention.  · Confusion.  · Memory problems.  · Sickness to your stomach or throwing up.  · Tiredness.  · Fussiness.  · Bothered by bright lights or loud noises.  · Anxiousness or depression.  · Restless sleep.  MAKE SURE YOU:   · Understand these instructions.  · Will watch your condition.  · Will get help right away if you are not doing well or get worse.  Document Released: 04/19/2008 Document Revised: 09/21/2013 Document Reviewed: 01/12/2013  Exit[employer]® Patient Information ©2015 [employer]. This information is not intended to replace advice given to you by your health care provider. Make sure you discuss any questions you have with your health care provider.

## 2015-02-20 NOTE — ED Notes (Signed)
Pt is from [facility].  She had an unobserved fall in the [employer].  Pt unsure why she fell or what she struck.  Pt has had a few falls recently.  Pt has a large hematoma to the Right side of her head.  Bleeding was controlled upon EMS arrival but they did observe a good deal of blood on scene.  Pt has A&O x4 which is her baseline.  Pt uses 5L of O2 24/7.  Pt is a DNR but we have no paperwork from facility.  Pt does not take any blood thinners.

## 2015-02-20 NOTE — ED Notes (Addendum)
Pt had syncope episode after using bedpan. Had to call patient name several times. After she alert, she opened her eyes didn't realized what had happened. Her daughter informed her that she had "did it again."

## 2015-02-20 NOTE — ED Notes (Signed)
Bed: WA08 Expected date:  Expected time:  Means of arrival:  Comments: EMS- pancreatitis w/ ETOH on board

## 2015-02-21 ENCOUNTER — Observation Stay (HOSPITAL_BASED_OUTPATIENT_CLINIC_OR_DEPARTMENT_OTHER): Payer: Medicare Other

## 2015-02-21 ENCOUNTER — Observation Stay (HOSPITAL_COMMUNITY): Payer: Medicare Other

## 2015-02-21 DIAGNOSIS — R55 Syncope and collapse: Secondary | ICD-10-CM | POA: Diagnosis not present

## 2015-02-21 DIAGNOSIS — J449 Chronic obstructive pulmonary disease, unspecified: Secondary | ICD-10-CM

## 2015-02-21 DIAGNOSIS — L8990 Pressure ulcer of unspecified site, unspecified stage: Secondary | ICD-10-CM | POA: Diagnosis present

## 2015-02-21 DIAGNOSIS — I482 Chronic atrial fibrillation: Secondary | ICD-10-CM | POA: Diagnosis not present

## 2015-02-21 DIAGNOSIS — R9431 Abnormal electrocardiogram [ECG] [EKG]: Secondary | ICD-10-CM | POA: Diagnosis present

## 2015-02-21 DIAGNOSIS — I5032 Chronic diastolic (congestive) heart failure: Secondary | ICD-10-CM

## 2015-02-21 DIAGNOSIS — S0990XA Unspecified injury of head, initial encounter: Secondary | ICD-10-CM | POA: Insufficient documentation

## 2015-02-21 DIAGNOSIS — R0902 Hypoxemia: Secondary | ICD-10-CM | POA: Insufficient documentation

## 2015-02-21 LAB — URINALYSIS, ROUTINE W REFLEX MICROSCOPIC
Bilirubin Urine: NEGATIVE
GLUCOSE, UA: NEGATIVE mg/dL
HGB URINE DIPSTICK: NEGATIVE
KETONES UR: NEGATIVE mg/dL
Nitrite: NEGATIVE
PROTEIN: NEGATIVE mg/dL
Specific Gravity, Urine: 1.011 (ref 1.005–1.030)
Urobilinogen, UA: 0.2 mg/dL (ref 0.0–1.0)
pH: 6.5 (ref 5.0–8.0)

## 2015-02-21 LAB — TSH: TSH: 3.153 u[IU]/mL (ref 0.350–4.500)

## 2015-02-21 LAB — CBC
HCT: 40 % (ref 36.0–46.0)
Hemoglobin: 12.7 g/dL (ref 12.0–15.0)
MCH: 30.8 pg (ref 26.0–34.0)
MCHC: 31.8 g/dL (ref 30.0–36.0)
MCV: 96.9 fL (ref 78.0–100.0)
PLATELETS: 89 10*3/uL — AB (ref 150–400)
RBC: 4.13 MIL/uL (ref 3.87–5.11)
RDW: 14.1 % (ref 11.5–15.5)
WBC: 9.6 10*3/uL (ref 4.0–10.5)

## 2015-02-21 LAB — COMPREHENSIVE METABOLIC PANEL
ALK PHOS: 66 U/L (ref 38–126)
ALT: 12 U/L — AB (ref 14–54)
AST: 20 U/L (ref 15–41)
Albumin: 3.7 g/dL (ref 3.5–5.0)
Anion gap: 8 (ref 5–15)
BUN: 25 mg/dL — ABNORMAL HIGH (ref 6–20)
CALCIUM: 8.9 mg/dL (ref 8.9–10.3)
CO2: 31 mmol/L (ref 22–32)
CREATININE: 1.16 mg/dL — AB (ref 0.44–1.00)
Chloride: 99 mmol/L — ABNORMAL LOW (ref 101–111)
GFR, EST AFRICAN AMERICAN: 48 mL/min — AB (ref >60)
GFR, EST NON AFRICAN AMERICAN: 42 mL/min — AB (ref >60)
Glucose, Bld: 140 mg/dL — ABNORMAL HIGH (ref 65–99)
Potassium: 3.8 mmol/L (ref 3.5–5.1)
Sodium: 138 mmol/L (ref 135–145)
Total Bilirubin: 1 mg/dL (ref 0.3–1.2)
Total Protein: 6.9 g/dL (ref 6.5–8.1)

## 2015-02-21 LAB — TROPONIN I
Troponin I: 0.03 ng/mL (ref <0.031)
Troponin I: 0.03 ng/mL (ref <0.031)
Troponin I: 0.03 ng/mL (ref <0.031)

## 2015-02-21 LAB — CREATININE, URINE, RANDOM: CREATININE, URINE: 27.22 mg/dL

## 2015-02-21 LAB — URINE MICROSCOPIC-ADD ON

## 2015-02-21 LAB — SODIUM, URINE, RANDOM: Sodium, Ur: 117 mmol/L

## 2015-02-21 LAB — MRSA PCR SCREENING: MRSA by PCR: NEGATIVE

## 2015-02-21 LAB — MAGNESIUM: Magnesium: 2 mg/dL (ref 1.7–2.4)

## 2015-02-21 LAB — PHOSPHORUS: Phosphorus: 3.6 mg/dL (ref 2.5–4.6)

## 2015-02-21 MED ORDER — ACETAMINOPHEN 325 MG PO TABS: 650.0000 mg | ORAL_TABLET | Freq: Four times a day (QID) | ORAL | Status: DC | PRN

## 2015-02-21 MED ORDER — HYDROCODONE-ACETAMINOPHEN 5-325 MG PO TABS: 1.0000 | ORAL_TABLET | ORAL | Status: DC | PRN

## 2015-02-21 MED ORDER — ONDANSETRON HCL 4 MG PO TABS: 4.0000 mg | ORAL_TABLET | Freq: Three times a day (TID) | ORAL | Status: DC | PRN

## 2015-02-21 MED ORDER — SODIUM CHLORIDE 0.9 % IJ SOLN
3.0000 mL | Freq: Two times a day (BID) | INTRAMUSCULAR | Status: DC
  Administered 2015-02-21 (×2): 3 mL via INTRAVENOUS

## 2015-02-21 MED ORDER — LORAZEPAM 1 MG PO TABS: 1.0000 mg | ORAL_TABLET | ORAL | Status: DC | PRN

## 2015-02-21 MED ORDER — LEVOTHYROXINE SODIUM 25 MCG PO TABS
62.5000 ug | ORAL_TABLET | Freq: Every day | ORAL | Status: DC
  Administered 2015-02-21 – 2015-02-22 (×2): 62.5 ug via ORAL
  Filled 2015-02-21 (×2): qty 3

## 2015-02-21 MED ORDER — AMIODARONE HCL 100 MG PO TABS
100.0000 mg | ORAL_TABLET | Freq: Every day | ORAL | Status: DC
  Administered 2015-02-22: 100 mg via ORAL
  Filled 2015-02-21 (×2): qty 1

## 2015-02-21 MED ORDER — FUROSEMIDE 20 MG PO TABS
20.0000 mg | ORAL_TABLET | Freq: Two times a day (BID) | ORAL | Status: DC
  Administered 2015-02-21: 20 mg via ORAL
  Filled 2015-02-21: qty 1

## 2015-02-21 MED ORDER — INFLUENZA VAC SPLIT QUAD 0.5 ML IM SUSY
0.5000 mL | PREFILLED_SYRINGE | INTRAMUSCULAR | Status: AC
  Administered 2015-02-22: 0.5 mL via INTRAMUSCULAR
  Filled 2015-02-21 (×2): qty 0.5

## 2015-02-21 MED ORDER — PSYLLIUM 95 % PO PACK
1.0000 | PACK | Freq: Every day | ORAL | Status: DC
  Filled 2015-02-21 (×2): qty 1

## 2015-02-21 MED ORDER — SODIUM CHLORIDE 0.9 % IV SOLN
INTRAVENOUS | Status: DC
Start: 2015-02-21 — End: 2015-02-21
  Administered 2015-02-21: 02:00:00 via INTRAVENOUS

## 2015-02-21 MED ORDER — ACETAMINOPHEN 650 MG RE SUPP: 650.0000 mg | Freq: Four times a day (QID) | RECTAL | Status: DC | PRN

## 2015-02-21 MED ORDER — ALBUTEROL SULFATE HFA 108 (90 BASE) MCG/ACT IN AERS: 2.0000 | INHALATION_SPRAY | Freq: Four times a day (QID) | RESPIRATORY_TRACT | Status: DC | PRN

## 2015-02-21 MED ORDER — SERTRALINE HCL 100 MG PO TABS
100.0000 mg | ORAL_TABLET | Freq: Every day | ORAL | Status: DC
  Administered 2015-02-21 – 2015-02-22 (×2): 100 mg via ORAL
  Filled 2015-02-21 (×2): qty 1

## 2015-02-21 MED ORDER — METOPROLOL TARTRATE 25 MG PO TABS
25.0000 mg | ORAL_TABLET | Freq: Two times a day (BID) | ORAL | Status: DC
  Administered 2015-02-21 (×2): 25 mg via ORAL
  Filled 2015-02-21 (×4): qty 1

## 2015-02-21 NOTE — Progress Notes (Signed)
PT Cancellation Note  Patient Details Name: Patricia [name] MRN: [id number] DOB: 1927/04/04   Cancelled Treatment:    Reason Eval/Treat Not Completed: Fatigue/lethargy limiting ability to participate Pt reports too fatigued to participate at this time.  Will check back as schedule permits.   Catha Ontko,KATHrine E 02/21/2015, 3:33 PM  Zenovia Jarred, PT, DPT 02/21/2015 Pager: [phone]

## 2015-02-21 NOTE — Progress Notes (Signed)
[facility] PROGRESS NOTE  Patricia [name] ZOX:[id number] DOB: 01/09/1927 DOA: 02/20/2015 PCP: Londell Moh, MD  Assessment/Plan: 81 y/o female with PMH of HTN, CAD, COPD, hypertrophic cardiomyopathy, atrial fibrillation not a candidate for anticoagulation, type B aortic dissection not a candidate for repair, mitral valve stenosis is not a candidate for repair, history of diastolic heart failure on mild Lasix as well as history severe pulmonary hypertension on 5 L of oxygen at home and history of COPD, frequent falls as well as frequent syncopal events presented with unwitnessed fall resulting in large frontal hematoma, and  recurrent syncope while in emergency department.    1. Recurrent syncope. fall In the setting of multiple cardiopulmonary disease. Neuro exam is non focal. CT head: no acute: no acute infarcts. Large R sided frontoparietal hematoma.  -awaiting cardiology eval, fall precautions. PT/OT. Obtain orthostatics. Hold diuretic today   2. Chronic Respiratory Failure. COPD. No wheezing on exam. Cont oxygen. Bronchodilators as needed 3. CHF. Right sided HF. Pulmonary HTN. Clinically euvolemic. Awaiting repeat echo. Hold diuretics while on gentle IVF today  4. Chronic atrial fibrillation (HCC) not a candidate for anticoagulation currently rate controlled  Patient is DNR. Under hospice care   Code Status: DNR Family Communication: d/w patient, RN (indicate person spoken with, relationship, and if by phone, the number) Disposition Plan: pend PT. SNF in 24-48 hrs    Consultants:  Cardiology   Procedures:  Pend echo   Antibiotics:  none (indicate start date, and stop date if known)  HPI/Subjective: Alert. No distress   Objective: Filed Vitals:   02/21/15 0626  BP: 120/60  Pulse: 59  Temp: 97.6 F (36.4 C)  Resp: 20    Intake/Output Summary (Last 24 hours) at 02/21/15 1018 Last data filed at 02/21/15 0600  Gross per 24 hour  Intake    250 ml   Output    100 ml  Net    150 ml   Filed Weights   02/21/15 0135  Weight: 71.305 kg (157 lb 3.2 oz)    Exam:   General:  Alert. No distress   Cardiovascular: s1,s2 rrr  Respiratory: CTA BL  Abdomen: soft, nt,nd   Musculoskeletal: no leg edema   Data Reviewed: Basic Metabolic Panel:  Recent Labs Lab 02/20/15 2235 02/21/15 0254  NA 140 138  K 4.1 3.8  CL 101 99*  CO2 30 31  GLUCOSE 114* 140*  BUN 27* 25*  CREATININE 1.21* 1.16*  CALCIUM 9.2 8.9  MG  --  2.0  PHOS  --  3.6   Liver Function Tests:  Recent Labs Lab 02/21/15 0254  AST 20  ALT 12*  ALKPHOS 66  BILITOT 1.0  PROT 6.9  ALBUMIN 3.7   No results for input(s): LIPASE, AMYLASE in the last 168 hours. No results for input(s): AMMONIA in the last 168 hours. CBC:  Recent Labs Lab 02/20/15 2235 02/21/15 0254  WBC 9.6 9.6  NEUTROABS 7.6  --   HGB 12.8 12.7  HCT 40.3 40.0  MCV 97.6 96.9  PLT 98* 89*   Cardiac Enzymes:  Recent Labs Lab 02/20/15 2235 02/21/15 0254 02/21/15 0815  TROPONINI 0.03 0.03 0.03   BNP (last 3 results)  Recent Labs  11/05/14 1415  BNP 1327.6*    ProBNP (last 3 results)  Recent Labs  03/07/14 1356  PROBNP 4865.0*    CBG: No results for input(s): GLUCAP in the last 168 hours.  Recent Results (from the past 240 hour(s))  MRSA PCR Screening  Status: None   Collection Time: 02/21/15  1:50 AM  Result Value Ref Range Status   MRSA by PCR NEGATIVE NEGATIVE Final    Comment:        The GeneXpert MRSA Assay (FDA approved for NASAL specimens only), is one component of a comprehensive MRSA colonization surveillance program. It is not intended to diagnose MRSA infection nor to guide or monitor treatment for MRSA infections.      Studies: Dg Chest 2 View  02/21/2015   CLINICAL DATA:  Hypoxia, and unwitnessed fall with RIGHT head hematoma. History of Coronary artery disease, hypertension, COPD.  EXAM: CHEST  2 VIEW  COMPARISON:  Chest radiograph  November 05, 2014 and CT chest August 20, 2013  FINDINGS: The cardiac silhouette is mildly enlarged, unchanged. Similar fullness of pulmonary hila corresponded to vascular structures on prior CT chest. Calcified aortic knob. Similar chronic interstitial changes without pleural effusion or focal consolidation. Status post median sternotomy for cardiac valve replacement. Biapical pleural thickening. No pneumothorax. Broad dextroscoliosis could be positional. Soft tissue planes and included osseous structure nonsuspicious.  IMPRESSION: Stable cardiomegaly and chronic interstitial changes.   Electronically Signed   By: Awilda Metro M.D.   On: 02/21/2015 01:36   Ct Head Wo Contrast  02/20/2015   CLINICAL DATA:  Assisted living patient with unobserved fall. No reported loss of consciousness or current neurologic deficit.  EXAM: CT HEAD WITHOUT CONTRAST  TECHNIQUE: Contiguous axial images were obtained from the base of the skull through the vertex without intravenous contrast.  COMPARISON:  02/13/2014.  FINDINGS: No evidence for acute infarction, hemorrhage, mass lesion, hydrocephalus, or extra-axial fluid. Generalized atrophy. Chronic microvascular ischemic change. Remote LEFT inferior cerebellar infarct.  Large RIGHT frontoparietal scalp hematoma. No underlying skull fracture. No subarachnoid or subdural blood.  No acute sinus fluid. No mastoid fluid. Carotid siphon vascular calcification. BILATERAL cataract extraction. Aside from the scalp hematoma, no change from priors.  IMPRESSION: Chronic changes as described.  Large RIGHT frontoparietal scalp hematoma. No underlying skull fracture or intracranial hemorrhage.   Electronically Signed   By: Elsie Stain M.D.   On: 02/20/2015 20:17    Scheduled Meds: . amiodarone  100 mg Oral Daily  . furosemide  20 mg Oral BID  . [START ON 02/22/2015] Influenza vac split quadrivalent PF  0.5 mL Intramuscular Tomorrow-1000  . levothyroxine  62.5 mcg Oral QAC breakfast  .  metoprolol tartrate  25 mg Oral BID  . psyllium  1 packet Oral QHS  . sertraline  100 mg Oral Daily  . sodium chloride  3 mL Intravenous Q12H   Continuous Infusions:   Active Problems:   Hypertrophic obstructive cardiomyopathy (HCC)   Chronic atrial fibrillation (HCC)   History of mitral valve replacement with bioprosthetic valve   COPD, mild (HCC)   Pulmonary hypertension, secondary (HCC)   OSA (obstructive sleep apnea)   Chronic diastolic heart failure (HCC)   Syncope   Prolonged QT interval   Pressure ulcer    Time spent: >35 minutes     Esperanza Sheets  [facility] Pager [phone]. If 7PM-7AM, please contact night-coverage at [email or web address], password [facility] 02/21/2015, 10:18 AM

## 2015-02-21 NOTE — Progress Notes (Signed)
Echocardiogram 2D Echocardiogram has been performed.  Patricia [name] 02/21/2015, 10:29 AM

## 2015-02-21 NOTE — Consult Note (Signed)
Reason for Consult:   Syncope and collapse  Requesting Physician: [facility] Primary Cardiologist Dr Tenny Craw  HPI:  Pleasant 81 y/o female with a history of MVR 2007, pulmonary HTN, CAF, COPD on home O2, type B AO dissection, and multiple falls. She is not an anticoagulation candidate, (CHAds VASC= 6). She was admitted after a syncopal spell 02/20/15. She said she was walking to the dinning room and "blacked out". She had no warning, woke up on the floor after her husband came. Echo done in Feb 2015 showed preserved LVF with PA pressure of 60. Echo done 02/21/15- PA 37 mmHg, EF 60%, no significant MS, moderate AR.   PMHx:  Past Medical History  Diagnosis Date  . CAD (coronary artery disease)   . Mitral valve disorder     a. s/p MVR;  b. 06/2013 Echo: EF 55-60%, no rwma, Ao sclerosis, mild AI, mild MR, miod dil LA, sev dil RV with reduced fxn, sev dil RA, mod TR/PR, PASP (study reviewed by Dr. Verlin Fester- MS felt to be sev, peak grad 39/mean 13).  [name] HTN (hypertension)   . Dyslipidemia   . Aortic stenosis, mild   . Breast cyst   . Hypertrophic cardiomyopathy (HCC)   . Sleep apnea   . COPD (chronic obstructive pulmonary disease) (HCC)     "a touch of"; prn o2 at home with activity   . Atrial fibrillation (HCC)     a. chronic coumadin.  [name] Hypothyroidism   . Anemia   . Chronic right-sided heart failure [facility])     Past Surgical History  Procedure Laterality Date  . Laparoscopic cholecystectomy  2009  . Cataract extraction  2003    bilateral  . Mitral valve replacement  2007  . Neck mass excision  2003    benign  . Colonoscopy    . Broken wrist      left, metal implanted   . Broken ankle      right, metal implanted   . Cardiac catheterization  06/2011  . Breast surgery      bilateral cysts x3 removed, benign   . Tee without cardioversion  01/31/2012    Procedure: TRANSESOPHAGEAL ECHOCARDIOGRAM (TEE);  Surgeon: Pricilla Riffle, MD;  Location: [facility] ENDOSCOPY;  Service:  Cardiovascular;  Laterality: N/A;  . Cardioversion N/A 01/16/2013    Procedure: CARDIOVERSION;  Surgeon: Vesta Mixer, MD;  Location: [facility] ENDOSCOPY;  Service: Cardiovascular;  Laterality: N/A;    SOCHx:  reports that she quit smoking about 24 years ago. Her smoking use included Cigarettes. She has a 40 pack-year smoking history. She has never used smokeless tobacco. She reports that she does not drink alcohol or use illicit drugs.  FAMHx: Family History  Problem Relation Age of Onset  . Diabetes Mother   . CVA Mother   . Other Father   . Diabetes Brother   . Cancer Brother     throat ca, smoker  . Aneurysm Brother     brain aneurysm  . Breast cancer Sister   . Emphysema Sister     smoker    ALLERGIES: Allergies  Allergen Reactions  . Ivp Dye [Iodinated Diagnostic Agents] Itching  . Nitrofurantoin Itching  . Ramipril Other (See Comments)    unknown    ROS: Review of Systems: General: negative for chills, fever, night sweats or weight changes.  Cardiovascular: negative for chest pain, dyspnea on exertion, edema, orthopnea, palpitations, paroxysmal nocturnal dyspnea or shortness  of breath HEENT: negative for any visual disturbances, blindness, glaucoma Dermatological: negative for rash Respiratory: negative for cough, hemoptysis, or wheezing Urologic: negative for hematuria or dysuria Abdominal: negative for nausea, vomiting, diarrhea, bright red blood per rectum, melena, or hematemesis Neurologic: negative for visual changes, syncope, or dizziness Musculoskeletal: negative for back pain, joint pain, or swelling Psych: cooperative and appropriate All other systems reviewed and are otherwise negative except as noted above.   HOME MEDICATIONS: Prior to Admission medications   Medication Sig Start Date End Date Taking? Authorizing Provider  acetaminophen (TYLENOL) 325 MG tablet Take 2 tablets (650 mg total) by mouth every 6 (six) hours as needed for mild pain (or Fever  >/= 101). 11/08/14  Yes Shanker Levora Dredge, MD  albuterol (PROVENTIL HFA;VENTOLIN HFA) 108 (90 BASE) MCG/ACT inhaler Inhale 2 puffs into the lungs every 6 (six) hours as needed for wheezing or shortness of breath. 11/08/14  Yes Shanker Levora Dredge, MD  amiodarone (PACERONE) 200 MG tablet Take 0.5 tablets (100 mg total) by mouth daily. 07/08/14  Yes Pricilla Riffle, MD  Calcium Carb-Cholecalciferol (CALCIUM + D3) 600-200 MG-UNIT TABS Take 1 tablet by mouth 2 (two) times daily.   Yes Historical Provider, MD  cholecalciferol (VITAMIN D) 1000 UNITS tablet Take 1,000 Units by mouth at bedtime.    Yes Historical Provider, MD  furosemide (LASIX) 20 MG tablet Take 1 tablet (20 mg total) by mouth 2 (two) times daily. 01/26/15  Yes Pricilla Riffle, MD  levothyroxine (SYNTHROID, LEVOTHROID) 125 MCG tablet Take 62.5 mcg by mouth daily before breakfast.   Yes Historical Provider, MD  LORazepam (ATIVAN) 0.5 MG tablet Take 2 tablets (1 mg total) by mouth every 2 (two) hours as needed for anxiety or seizure. 11/08/14  Yes Shanker Levora Dredge, MD  metoprolol tartrate (LOPRESSOR) 25 MG tablet Take 1 tablet (25 mg total) by mouth 2 (two) times daily. 09/21/13  Yes Pricilla Riffle, MD  nitroGLYCERIN (NITROSTAT) 0.4 MG SL tablet Place 0.4 mg under the tongue every 5 (five) minutes as needed for chest pain.   Yes Historical Provider, MD  ondansetron (ZOFRAN) 4 MG tablet Take 4 mg by mouth every 8 (eight) hours as needed for nausea or vomiting.   Yes Historical Provider, MD  potassium chloride SA (K-DUR,KLOR-CON) 20 MEQ tablet Take 1 tablet (20 mEq total) by mouth 2 (two) times daily. 01/26/15  Yes Pricilla Riffle, MD  Psyllium (FIBER) 0.52 G CAPS Take 2 capsules by mouth at bedtime.   Yes Historical Provider, MD  sertraline (ZOLOFT) 100 MG tablet Take 100 mg by mouth daily.   Yes Historical Provider, MD  calcitonin, salmon, (MIACALCIN/FORTICAL) 200 UNIT/ACT nasal spray ONE SPRAY IN ONE NOSTRIL DAILY ALTERNATE NOSTRILS DAILY Patient not taking:  Reported on 11/05/2014 06/10/14   Pricilla Riffle, MD  cyclobenzaprine (FLEXERIL) 5 MG tablet Take 1 tablet (5 mg total) by mouth 3 (three) times daily as needed for muscle spasms. Patient not taking: Reported on 02/20/2015 11/08/14   Maretta Bees, MD    HOSPITAL MEDICATIONS: I have reviewed the patient's current medications.  VITALS: Blood pressure 100/60, pulse 61, temperature 97.6 F (36.4 C), temperature source Oral, resp. rate 20, height  (1.626 m), weight 157 lb 3.2 oz (71.305 kg), SpO2 100 %.  PHYSICAL EXAM: General appearance: alert, cooperative and no distress Neck: JVD - a few cm above sternal notch and . Lungs: clear to auscultation bilaterally Heart: irregularly irregular rhythm Abdomen: soft, non-tender; bowel sounds normal;  no masses,  no organomegaly Extremities: extremities normal, atraumatic, no cyanosis or edema Pulses: 2+ and symmetric Skin: pale cool dry Neurologic: Grossly normal  LABS: Results for orders placed or performed during the hospital encounter of 02/20/15 (from the past 24 hour(s))  CBC with Differential     Status: Abnormal   Collection Time: 02/20/15 10:35 PM  Result Value Ref Range   WBC 9.6 4.0 - 10.5 K/uL   RBC 4.13 3.87 - 5.11 MIL/uL   Hemoglobin 12.8 12.0 - 15.0 g/dL   HCT 62.1 30.8 - 65.7 %   MCV 97.6 78.0 - 100.0 fL   MCH 31.0 26.0 - 34.0 pg   MCHC 31.8 30.0 - 36.0 g/dL   RDW 84.6 96.2 - 95.2 %   Platelets 98 (L) 150 - 400 K/uL   Neutrophils Relative % 79 %   Neutro Abs 7.6 1.7 - 7.7 K/uL   Lymphocytes Relative 8 %   Lymphs Abs 0.8 0.7 - 4.0 K/uL   Monocytes Relative 5 %   Monocytes Absolute 0.5 0.1 - 1.0 K/uL   Eosinophils Relative 7 %   Eosinophils Absolute 0.7 0.0 - 0.7 K/uL   Basophils Relative 0 %   Basophils Absolute 0.0 0.0 - 0.1 K/uL  Basic metabolic panel     Status: Abnormal   Collection Time: 02/20/15 10:35 PM  Result Value Ref Range   Sodium 140 135 - 145 mmol/L   Potassium 4.1 3.5 - 5.1 mmol/L   Chloride 101  101 - 111 mmol/L   CO2 30 22 - 32 mmol/L   Glucose, Bld 114 (H) 65 - 99 mg/dL   BUN 27 (H) 6 - 20 mg/dL   Creatinine, Ser 8.41 (H) 0.44 - 1.00 mg/dL   Calcium 9.2 8.9 - 32.4 mg/dL   GFR calc non Af Amer 40 (L) >60 mL/min   GFR calc Af Amer 46 (L) >60 mL/min   Anion gap 9 5 - 15  Troponin I     Status: None   Collection Time: 02/20/15 10:35 PM  Result Value Ref Range   Troponin I 0.03 <0.031 ng/mL  Urinalysis, Routine w reflex microscopic (not at [facility])     Status: Abnormal   Collection Time: 02/21/15  1:11 AM  Result Value Ref Range   Color, Urine YELLOW YELLOW   APPearance CLEAR CLEAR   Specific Gravity, Urine 1.011 1.005 - 1.030   pH 6.5 5.0 - 8.0   Glucose, UA NEGATIVE NEGATIVE mg/dL   Hgb urine dipstick NEGATIVE NEGATIVE   Bilirubin Urine NEGATIVE NEGATIVE   Ketones, ur NEGATIVE NEGATIVE mg/dL   Protein, ur NEGATIVE NEGATIVE mg/dL   Urobilinogen, UA 0.2 0.0 - 1.0 mg/dL   Nitrite NEGATIVE NEGATIVE   Leukocytes, UA SMALL (A) NEGATIVE  Urine microscopic-add on     Status: None   Collection Time: 02/21/15  1:11 AM  Result Value Ref Range   Squamous Epithelial / LPF RARE RARE   WBC, UA 3-6 <3 WBC/hpf   Bacteria, UA RARE RARE  Creatinine, urine, random     Status: None   Collection Time: 02/21/15  1:40 AM  Result Value Ref Range   Creatinine, Urine 27.22 mg/dL  Sodium, urine, random     Status: None   Collection Time: 02/21/15  1:40 AM  Result Value Ref Range   Sodium, Ur 117 mmol/L  MRSA PCR Screening     Status: None   Collection Time: 02/21/15  1:50 AM  Result Value Ref Range  MRSA by PCR NEGATIVE NEGATIVE  Troponin I (q 6hr x 3)     Status: None   Collection Time: 02/21/15  2:54 AM  Result Value Ref Range   Troponin I 0.03 <0.031 ng/mL  Magnesium     Status: None   Collection Time: 02/21/15  2:54 AM  Result Value Ref Range   Magnesium 2.0 1.7 - 2.4 mg/dL  Phosphorus     Status: None   Collection Time: 02/21/15  2:54 AM  Result Value Ref Range   Phosphorus  3.6 2.5 - 4.6 mg/dL  TSH     Status: None   Collection Time: 02/21/15  2:54 AM  Result Value Ref Range   TSH 3.153 0.350 - 4.500 uIU/mL  Comprehensive metabolic panel     Status: Abnormal   Collection Time: 02/21/15  2:54 AM  Result Value Ref Range   Sodium 138 135 - 145 mmol/L   Potassium 3.8 3.5 - 5.1 mmol/L   Chloride 99 (L) 101 - 111 mmol/L   CO2 31 22 - 32 mmol/L   Glucose, Bld 140 (H) 65 - 99 mg/dL   BUN 25 (H) 6 - 20 mg/dL   Creatinine, Ser 4.09 (H) 0.44 - 1.00 mg/dL   Calcium 8.9 8.9 - 81.1 mg/dL   Total Protein 6.9 6.5 - 8.1 g/dL   Albumin 3.7 3.5 - 5.0 g/dL   AST 20 15 - 41 U/L   ALT 12 (L) 14 - 54 U/L   Alkaline Phosphatase 66 38 - 126 U/L   Total Bilirubin 1.0 0.3 - 1.2 mg/dL   GFR calc non Af Amer 42 (L) >60 mL/min   GFR calc Af Amer 48 (L) >60 mL/min   Anion gap 8 5 - 15  CBC     Status: Abnormal   Collection Time: 02/21/15  2:54 AM  Result Value Ref Range   WBC 9.6 4.0 - 10.5 K/uL   RBC 4.13 3.87 - 5.11 MIL/uL   Hemoglobin 12.7 12.0 - 15.0 g/dL   HCT 91.4 78.2 - 95.6 %   MCV 96.9 78.0 - 100.0 fL   MCH 30.8 26.0 - 34.0 pg   MCHC 31.8 30.0 - 36.0 g/dL   RDW 21.3 08.6 - 57.8 %   Platelets 89 (L) 150 - 400 K/uL  Troponin I (q 6hr x 3)     Status: None   Collection Time: 02/21/15  8:15 AM  Result Value Ref Range   Troponin I 0.03 <0.031 ng/mL    EKG: AF with LVH  IMAGING: Dg Chest 2 View  02/21/2015   CLINICAL DATA:  Hypoxia, and unwitnessed fall with RIGHT head hematoma. History of Coronary artery disease, hypertension, COPD.  EXAM: CHEST  2 VIEW  COMPARISON:  Chest radiograph November 05, 2014 and CT chest August 20, 2013  FINDINGS: The cardiac silhouette is mildly enlarged, unchanged. Similar fullness of pulmonary hila corresponded to vascular structures on prior CT chest. Calcified aortic knob. Similar chronic interstitial changes without pleural effusion or focal consolidation. Status post median sternotomy for cardiac valve replacement. Biapical pleural  thickening. No pneumothorax. Broad dextroscoliosis could be positional. Soft tissue planes and included osseous structure nonsuspicious.  IMPRESSION: Stable cardiomegaly and chronic interstitial changes.   Electronically Signed   By: Awilda Metro M.D.   On: 02/21/2015 01:36   Ct Head Wo Contrast  02/20/2015   CLINICAL DATA:  Assisted living patient with unobserved fall. No reported loss of consciousness or current neurologic deficit.  EXAM: CT HEAD  WITHOUT CONTRAST  TECHNIQUE: Contiguous axial images were obtained from the base of the skull through the vertex without intravenous contrast.  COMPARISON:  02/13/2014.  FINDINGS: No evidence for acute infarction, hemorrhage, mass lesion, hydrocephalus, or extra-axial fluid. Generalized atrophy. Chronic microvascular ischemic change. Remote LEFT inferior cerebellar infarct.  Large RIGHT frontoparietal scalp hematoma. No underlying skull fracture. No subarachnoid or subdural blood.  No acute sinus fluid. No mastoid fluid. Carotid siphon vascular calcification. BILATERAL cataract extraction. Aside from the scalp hematoma, no change from priors.  IMPRESSION: Chronic changes as described.  Large RIGHT frontoparietal scalp hematoma. No underlying skull fracture or intracranial hemorrhage.   Electronically Signed   By: Elsie Stain M.D.   On: 02/20/2015 20:17    IMPRESSION: Principal Problem:   Syncope and collapse Active Problems:   Hypertrophic obstructive cardiomyopathy (HCC)   Chronic atrial fibrillation (HCC)   History of mitral valve replacement with bioprosthetic valve   COPD, mild (HCC)   Pulmonary hypertension, secondary (HCC)   Chronic diastolic heart failure (HCC)   Prolonged QT interval   Descending thoracic dissection (HCC)   Chronic respiratory failure (HCC)   Pressure ulcer   RECOMMENDATION: Recurrent syncope and collapse. Pt has AF and is rate controlled with Amiodarone and beta blocker. Not a candidate for anticoagulation secondary  to recurrent falls. Check orthostatic B/P.   ? Consider loop recorder- she and family would agree to pacemaker if indicated, will discuss with MD. She did have a Holter in April 2015 that was unremarkable showing only AF with CVR.  Time Spent Directly with Patient: 30 minutes  Abelino Derrick [phone] beeper 02/21/2015, 12:45 PM   Patient examined chart reviewed.  Syncope in context of low oxygen stats (83%) pulmonary hypertension and dyspnea Don't think this is primary cardiac event.  Ok to d/c in am with rate control of afib with amiodarone and beta blocker There has been no evidence of bradycardia.  Outpatient f/u with Dr Tenny Craw.  Exam with frail elderly female SEM/AR murmur.  Poor airway movement and rhonchi on pulmonary exam.  Charlton Haws

## 2015-02-21 NOTE — Progress Notes (Signed)
BP 100/60, Pulse 61, notified MD and held 10AM metoprolol and amiodarone.

## 2015-02-22 DIAGNOSIS — J449 Chronic obstructive pulmonary disease, unspecified: Secondary | ICD-10-CM | POA: Diagnosis not present

## 2015-02-22 DIAGNOSIS — I5032 Chronic diastolic (congestive) heart failure: Secondary | ICD-10-CM | POA: Diagnosis not present

## 2015-02-22 DIAGNOSIS — I482 Chronic atrial fibrillation: Secondary | ICD-10-CM | POA: Diagnosis not present

## 2015-02-22 DIAGNOSIS — R55 Syncope and collapse: Secondary | ICD-10-CM | POA: Diagnosis not present

## 2015-02-22 LAB — URINE CULTURE

## 2015-02-22 NOTE — Discharge Summary (Signed)
Physician Discharge Summary  Patricia [name] FAO:[id number] DOB: 07/02/1926 DOA: 02/20/2015  PCP: Patricia Moh, MD  Admit date: 02/20/2015 Discharge date: 02/22/2015  Time spent: >35 minutes  Recommendations for Outpatient Follow-up:  F/u with Dr. Tenny [name] in 1-2 weeks F/u with hospice at he facility   Discharge Diagnoses:  Principal Problem:   Syncope and collapse Active Problems:   Hypertrophic obstructive cardiomyopathy (HCC)   Chronic atrial fibrillation (HCC)   History of mitral valve replacement with bioprosthetic valve   COPD, mild (HCC)   Pulmonary hypertension, secondary (HCC)   Chronic diastolic heart failure (HCC)   Descending thoracic dissection (HCC)   Chronic respiratory failure (HCC)   Prolonged QT interval   Pressure ulcer   Discharge Condition: stable   Diet recommendation: low sodium   Filed Weights   02/21/15 0135 02/22/15 0456  Weight: 71.305 kg (157 lb 3.2 oz) 71.442 kg (157 lb 8 oz)    History of present illness:  81 y/o female with PMH of HTN, CAD, COPD, hypertrophic cardiomyopathy, atrial fibrillation not a candidate for anticoagulation, type B aortic dissection not a candidate for repair, mitral valve stenosis is not a candidate for repair, history of diastolic heart failure on mild Lasix as well as history severe pulmonary hypertension on 5 L of oxygen at home and history of COPD, frequent falls as well as frequent syncopal events presented with unwitnessed fall resulting in large frontal hematoma, and recurrent syncope while in emergency department.   Hospital Course:  1. Recurrent syncope. H/o recurrent falls. Probable Fall In the setting of multiple cardiopulmonary disease. Neuro exam is non focal. CT head: no acute: no acute infarcts. Large R sided frontoparietal hematoma.  -no new symptoms. Patient remained clinically stable. She was evaluated by cardiology, who recommended to cont outpatient follow up. No cont BB, amiodarone  2. Chronic  Respiratory Failure. COPD. No wheezing on exam. Cont oxygen. Bronchodilators as needed 3. CHF. Right sided HF. Pulmonary HTN. Clinically euvolemic.resume home diuretics 4. Chronic atrial fibrillation (HCC) not a candidate for anticoagulation currently rate controlled  Patient is DNR. Under hospice care    Procedures:  Echo: Study Conclusions  - Left ventricle: The cavity size was normal. Wall thickness was increased in a pattern of severe LVH. There was focal basal hypertrophy. Systolic function was normal. The estimated ejection fraction was in the range of 60% to 65%. Wall motion was normal; there were no regional wall motion abnormalities. - Aortic valve: There was moderate regurgitation. Valve area (VTI): 2.23 cm^2. Valve area (Vmax): 2.27 cm^2. Valve area (Vmean): 2.17 cm^2. - Mitral valve: A bioprosthesis was present. The findings are consistent with trivial stenosis. Valve area by pressure half-time: 1.23 cm^2. Valve area by continuity equation (using LVOT flow): 0.88 cm^2. - Left atrium: The atrium was moderately to severely dilated. - Right atrium: The atrium was severely dilated. - Pulmonary arteries: Systolic pressure was mildly to moderately increased. PA peak pressure: 37 mm Hg (S).  (i.e. Studies not automatically included, echos, thoracentesis, etc; not x-rays)  Consultations:  Cardiology   Discharge Exam: Filed Vitals:   02/22/15 0456  BP: 113/69  Pulse: 63  Temp: 97.5 F (36.4 C)  Resp: 20    General: alert. No distress  Cardiovascular: s1,s2 rrr Respiratory: no wheezing   Discharge Instructions  Discharge Instructions    Diet - low sodium heart healthy    Complete by:  As directed      Discharge instructions    Complete by:  As directed  Please follow up with Dr. Tenny [name] as outpatient     Increase activity slowly    Complete by:  As directed             Medication List    STOP taking these medications         calcitonin (salmon) 200 UNIT/ACT nasal spray  Commonly known as:  MIACALCIN/FORTICAL     cyclobenzaprine 5 MG tablet  Commonly known as:  FLEXERIL      TAKE these medications        acetaminophen 325 MG tablet  Commonly known as:  TYLENOL  Take 2 tablets (650 mg total) by mouth every 6 (six) hours as needed for mild pain (or Fever >/= 101).     albuterol 108 (90 BASE) MCG/ACT inhaler  Commonly known as:  PROVENTIL HFA;VENTOLIN HFA  Inhale 2 puffs into the lungs every 6 (six) hours as needed for wheezing or shortness of breath.     amiodarone 200 MG tablet  Commonly known as:  PACERONE  Take 0.5 tablets (100 mg total) by mouth daily.     Calcium + D3 600-200 MG-UNIT Tabs  Take 1 tablet by mouth 2 (two) times daily.     cholecalciferol 1000 UNITS tablet  Commonly known as:  VITAMIN D  Take 1,000 Units by mouth at bedtime.     Fiber 0.52 G Caps  Take 2 capsules by mouth at bedtime.     furosemide 20 MG tablet  Commonly known as:  LASIX  Take 1 tablet (20 mg total) by mouth 2 (two) times daily.     levothyroxine 125 MCG tablet  Commonly known as:  SYNTHROID, LEVOTHROID  Take 62.5 mcg by mouth daily before breakfast.     LORazepam 0.5 MG tablet  Commonly known as:  ATIVAN  Take 2 tablets (1 mg total) by mouth every 2 (two) hours as needed for anxiety or seizure.     metoprolol tartrate 25 MG tablet  Commonly known as:  LOPRESSOR  Take 1 tablet (25 mg total) by mouth 2 (two) times daily.     nitroGLYCERIN 0.4 MG SL tablet  Commonly known as:  NITROSTAT  Place 0.4 mg under the tongue every 5 (five) minutes as needed for chest pain.     ondansetron 4 MG tablet  Commonly known as:  ZOFRAN  Take 4 mg by mouth every 8 (eight) hours as needed for nausea or vomiting.     potassium chloride SA 20 MEQ tablet  Commonly known as:  K-DUR,KLOR-CON  Take 1 tablet (20 mEq total) by mouth 2 (two) times daily.     sertraline 100 MG tablet  Commonly known as:  ZOLOFT  Take 100 mg  by mouth daily.       Allergies  Allergen Reactions  . Ivp Dye [Iodinated Diagnostic Agents] Itching  . Nitrofurantoin Itching  . Ramipril Other (See Comments)    unknown       Follow-up Information    Follow up with Patricia Moh, MD.   Specialty:  Internal Medicine   Why:  As needed   Contact information:   [address] [place] [address] [phone]       Follow up with Dietrich Pates, MD On 05/05/2015.   Specialty:  Cardiology   Why:  keep appointment as scheduled   Contact information:   [address] [place] [address] [phone]       Follow up with Patricia Moh, MD.  Specialty:  Internal Medicine   Contact information:   [address] [place] [address] [phone]        The results of significant diagnostics from this hospitalization (including imaging, microbiology, ancillary and laboratory) are listed below for reference.    Significant Diagnostic Studies: Dg Chest 2 View  02/21/2015   CLINICAL DATA:  Hypoxia, and unwitnessed fall with RIGHT head hematoma. History of Coronary artery disease, hypertension, COPD.  EXAM: CHEST  2 VIEW  COMPARISON:  Chest radiograph November 05, 2014 and CT chest August 20, 2013  FINDINGS: The cardiac silhouette is mildly enlarged, unchanged. Similar fullness of pulmonary hila corresponded to vascular structures on prior CT chest. Calcified aortic knob. Similar chronic interstitial changes without pleural effusion or focal consolidation. Status post median sternotomy for cardiac valve replacement. Biapical pleural thickening. No pneumothorax. Broad dextroscoliosis could be positional. Soft tissue planes and included osseous structure nonsuspicious.  IMPRESSION: Stable cardiomegaly and chronic interstitial changes.   Electronically Signed   By: Awilda Metro M.D.   On: 02/21/2015 01:36   Ct Head Wo Contrast  02/20/2015   CLINICAL DATA:  Assisted living  patient with unobserved fall. No reported loss of consciousness or current neurologic deficit.  EXAM: CT HEAD WITHOUT CONTRAST  TECHNIQUE: Contiguous axial images were obtained from the base of the skull through the vertex without intravenous contrast.  COMPARISON:  02/13/2014.  FINDINGS: No evidence for acute infarction, hemorrhage, mass lesion, hydrocephalus, or extra-axial fluid. Generalized atrophy. Chronic microvascular ischemic change. Remote LEFT inferior cerebellar infarct.  Large RIGHT frontoparietal scalp hematoma. No underlying skull fracture. No subarachnoid or subdural blood.  No acute sinus fluid. No mastoid fluid. Carotid siphon vascular calcification. BILATERAL cataract extraction. Aside from the scalp hematoma, no change from priors.  IMPRESSION: Chronic changes as described.  Large RIGHT frontoparietal scalp hematoma. No underlying skull fracture or intracranial hemorrhage.   Electronically Signed   By: Elsie Stain M.D.   On: 02/20/2015 20:17    Microbiology: Recent Results (from the past 240 hour(s))  Culture, Urine     Status: None   Collection Time: 02/21/15  1:11 AM  Result Value Ref Range Status   Specimen Description URINE, RANDOM  Final   Special Requests NONE  Final   Culture   Final    MULTIPLE SPECIES PRESENT, SUGGEST RECOLLECTION Performed at [facility]    Report Status 02/22/2015 FINAL  Final  MRSA PCR Screening     Status: None   Collection Time: 02/21/15  1:50 AM  Result Value Ref Range Status   MRSA by PCR NEGATIVE NEGATIVE Final    Comment:        The GeneXpert MRSA Assay (FDA approved for NASAL specimens only), is one component of a comprehensive MRSA colonization surveillance program. It is not intended to diagnose MRSA infection nor to guide or monitor treatment for MRSA infections.      Labs: Basic Metabolic Panel:  Recent Labs Lab 02/20/15 2235 02/21/15 0254  NA 140 138  K 4.1 3.8  CL 101 99*  CO2 30 31  GLUCOSE 114* 140*   BUN 27* 25*  CREATININE 1.21* 1.16*  CALCIUM 9.2 8.9  MG  --  2.0  PHOS  --  3.6   Liver Function Tests:  Recent Labs Lab 02/21/15 0254  AST 20  ALT 12*  ALKPHOS 66  BILITOT 1.0  PROT 6.9  ALBUMIN 3.7   No results for input(s): LIPASE, AMYLASE in the last 168 hours.  No results for input(s): AMMONIA in the last 168 hours. CBC:  Recent Labs Lab 02/20/15 2235 02/21/15 0254  WBC 9.6 9.6  NEUTROABS 7.6  --   HGB 12.8 12.7  HCT 40.3 40.0  MCV 97.6 96.9  PLT 98* 89*   Cardiac Enzymes:  Recent Labs Lab 02/20/15 2235 02/21/15 0254 02/21/15 0815 02/21/15 1400  TROPONINI 0.03 0.03 0.03 0.03   BNP: BNP (last 3 results)  Recent Labs  11/05/14 1415  BNP 1327.6*    ProBNP (last 3 results)  Recent Labs  03/07/14 1356  PROBNP 4865.0*    CBG: No results for input(s): GLUCAP in the last 168 hours.     SignedEsperanza Sheets  [facility] 02/22/2015, 10:34 AM

## 2015-02-22 NOTE — Evaluation (Signed)
Physical Therapy Evaluation Patient Details Name: Patricia [name] MRN: [id number] DOB: Nov 14, 1926 Today's Date: 02/22/2015   History of Present Illness  81 y/o female with a history of MVR 2007, pulmonary HTN, CAF, COPD on home O2, type B AO dissection, and multiple falls. She is not an anticoagulation candidate. She was admitted after a syncopal episode 02/20/15  Clinical Impression  Pt admitted with above diagnosis. Pt currently with functional limitations due to the deficits listed below (see PT Problem List).  Pt will benefit from skilled PT to increase their independence and safety with mobility to allow discharge to the venue listed below.   Pt assisted into bathroom and then ambulated in hallway while on 6L O2 [place] (SpO2 91%).  Pt denies any dizziness with mobility.  Pt reports she does not always use oxygen consistently and believes this likely caused her reason for admission.  Discussed oxygen saturations as she was 87% at rest room air and aware she will need to maintain oxygen upon discharge.    Follow Up Recommendations Home health PT;Supervision for mobility/OOB    Equipment Recommendations  None recommended by PT    Recommendations for Other Services       Precautions / Restrictions Precautions Precautions: Fall Precaution Comments: monitor sats      Mobility  Bed Mobility Overal bed mobility: Needs Assistance Bed Mobility: Supine to Sit     Supine to sit: Supervision        Transfers Overall transfer level: Needs assistance Equipment used: Rolling walker (2 wheeled) Transfers: Sit to/from Stand Sit to Stand: Min assist         General transfer comment: verbal cues for safe technique, assist to rise from toilet  Ambulation/Gait Ambulation/Gait assistance: Min guard Ambulation Distance (Feet): 160 Feet Assistive device: Rolling walker (2 wheeled)       General Gait Details: verbal cues for safe use of RW, used to rollator, SpO2 91% on 6L O2 upon  returning to room  Stairs            Wheelchair Mobility    Modified Rankin (Stroke Patients Only)       Balance Overall balance assessment: History of Falls (reports falls due to "blacking out")                                           Pertinent Vitals/Pain Pain Assessment: No/denies pain  Pt on 5L O2 [place] in room, required 6L O2 for mobility    Home Living Family/patient expects to be discharged to:: Assisted living     Type of Home: Assisted living         Home Equipment: Walker - 4 wheels Additional Comments: chronic 2L O2    Prior Function Level of Independence: Needs assistance   Gait / Transfers Assistance Needed: uses Rollator for in apartment and to get to dining hall   ADL's / Homemaking Assistance Needed: reports some assist required        Hand Dominance        Extremity/Trunk Assessment               Lower Extremity Assessment: Generalized weakness         Communication   Communication: No difficulties  Cognition Arousal/Alertness: Awake/alert Behavior During Therapy: WFL for tasks assessed/performed Overall Cognitive Status: No family/caregiver present to determine baseline cognitive functioning  General Comments General comments (skin integrity, edema, etc.): scalp laceration    Exercises        Assessment/Plan    PT Assessment Patient needs continued PT services  PT Diagnosis Difficulty walking;Generalized weakness   PT Problem List Decreased strength;Decreased activity tolerance;Decreased mobility;Cardiopulmonary status limiting activity;Decreased knowledge of use of DME  PT Treatment Interventions DME instruction;Gait training;Patient/family education;Functional mobility training;Therapeutic activities;Therapeutic exercise   PT Goals (Current goals can be found in the Care Plan section) Acute Rehab PT Goals PT Goal Formulation: With patient Time For Goal Achievement:  03/01/15 Potential to Achieve Goals: Good    Frequency Min 3X/week   Barriers to discharge        Co-evaluation               End of Session Equipment Utilized During Treatment: Gait belt;Oxygen Activity Tolerance: Patient tolerated treatment well Patient left: in chair;with call bell/phone within reach;with chair alarm set      Functional Assessment Tool Used: clinical judgement Functional Limitation: Mobility: Walking and moving around Mobility: Walking and Moving Around Current Status (Z6109): At least 20 percent but less than 40 percent impaired, limited or restricted Mobility: Walking and Moving Around Goal Status [phone]): At least 1 percent but less than 20 percent impaired, limited or restricted    Time: 0900-0920 PT Time Calculation (min) (ACUTE ONLY): 20 min   Charges:   PT Evaluation $Initial PT Evaluation Tier I: 1 Procedure     PT G Codes:   PT G-Codes **NOT FOR INPATIENT CLASS** Functional Assessment Tool Used: clinical judgement Functional Limitation: Mobility: Walking and moving around Mobility: Walking and Moving Around Current Status (U9811): At least 20 percent but less than 40 percent impaired, limited or restricted Mobility: Walking and Moving Around Goal Status [phone]): At least 1 percent but less than 20 percent impaired, limited or restricted    Byron Tipping,KATHrine E 02/22/2015, 10:44 AM  Zenovia Jarred, PT, DPT 02/22/2015 Pager: [phone]

## 2015-02-22 NOTE — Progress Notes (Signed)
Family transporting patient back to [facility]F today by car. Patient eager to return to ALF where she resides with her husband- she will continue with Hospice following her there-  Plans also confirmed with ALF Rep, Dee.  Reece Levy, MSW, Theresia Majors  [phone]

## 2015-02-22 NOTE — Progress Notes (Signed)
OT Cancellation Note  Patient Details Name: Patricia [name] MRN: [id number] DOB: 04-12-1927   Cancelled Treatment:    Reason Eval/Treat Not Completed: OT screened, no needs identified, will sign off. Upon entering room, pt found seated in recliner with 6 L/min 02 supplemental. When therapist introduced self, pt stated she didn't need any OT. Pt stated she was just released from OT about 1 month ago. Started education on energy conservation and family member stated pt didn't need education on that, that she was too sedentary. Pt lives with her husband in an assistive living community. Pt states she has assistance for showers and that she uses RW for functional mobility. Am signing off secondary to refusal. Please send text page to OT services if any questions, concerns, or with new orders: [phone] OR call office at [phone]. Thank you for the order.    Macil Crady , MS, OTR/L, CLT Pager: [phone]  02/22/2015, 12:01 PM

## 2015-02-22 NOTE — Clinical Social Work Note (Signed)
Clinical Social Work Assessment  Patient Details  Name: Patricia [name] MRN: [id number] Date of Birth: February 14, 1927  Date of referral:  02/22/15               Reason for consult:  Discharge Planning                Permission sought to share information with:  Family Supports Permission granted to share information::  Yes, Verbal Permission Granted  Name::      (Daughters and ALF REP)  Agency::     Relationship::     Contact Information:     Housing/Transportation Living arrangements for the past 2 months:  [place] of Information:  Patient Patient Interpreter Needed:  None Criminal Activity/Legal Involvement Pertinent to Current Situation/Hospitalization:  No - Comment as needed Significant Relationships:  Spouse, Adult Children Lives with:  Facility Resident, Spouse Do you feel safe going back to the place where you live?  Yes Need for family participation in patient care:  No (Coment)  Care giving concerns:   Patient is ready for dc back to ALF today.    Social Worker assessment / plan:  CSW met with patient who reports she lives at [facility] where she and her husband of 60+ years live together. They have resided there for about on year. The patient plans to return there today and resume her [place] care. FL2 and DC Summary have been sent to ALF and family to transport by car with oxygen.   Employment status:  Retired Forensic scientist:  Medicare PT Recommendations:  Home with [place] / Referral to community resources:  Other (Comment Required) (ALF)  Patient/Family's Response to care:  Patient is eager to return to ALF today- she and her family decline HH PT and OT as they feel she does not need the f/u.   Patient/Family's Understanding of and Emotional Response to Diagnosis, Current Treatment, and Prognosis:  Patient reports being a Hospice patient for about a year- "the nurse from Hospice comes to see me at [facility].   Emotional Assessment Appearance:  Appears younger than stated age, Developmentally appropriate Attitude/Demeanor/Rapport:  Other (positive) Affect (typically observed):  Accepting Orientation:  Oriented to Self, Oriented to Place, Oriented to  Time, Oriented to Situation Alcohol / Substance use:  Not Applicable Psych involvement (Current and /or in the community):  No (Comment)  Discharge Needs  Concerns to be addressed:  No discharge needs identified Readmission within the last 30 days:  No Current discharge risk:  None Barriers to Discharge:  No Barriers Identified   Ludwig Clarks, LCSW 02/22/2015, 1:13 PM

## 2015-04-06 ENCOUNTER — Telehealth: Payer: Self-pay

## 2015-04-06 NOTE — Telephone Encounter (Signed)
Pt's daughter Galen Dafteresa Walker called states pt is complaining of increased difficulty breathing, some noticeable swelling in abdomen and tightness in that same area when breathing.  Denies chest pain, and pt is not in acute distress at present.  Pt's daughter is requesting BNP and BMET be drawn to assess fluid levels, given pt's history of CHF with valvular disease.  [facility] and they can draw today STAT with a written order faxed over to them [phone].  Please call and advise, and or fax order.  Thanks.

## 2015-04-06 NOTE — Telephone Encounter (Signed)
[facility] and talked to the nurse Geraldine ContrasDee that is taking care of Patricia [name]. Geraldine ContrasDee stated that they have not noticed a significant difference in patient condition. Called patient's daughter Patricia [name] [facility] she informed me that patient informed her that she has been having trouble breathing at night. Patient's daughter also stated that her mother's swelling is usually in her abdomen, not her extremities, and that Dr. Tenny Crawoss is aware of this. Patient's daughter is concerned that patient will end up in the hospital again for fluid overload. Patient's daughter confirmed that patient is on hospice. Will forward to Dr. Tenny Crawoss to see if she wants to order labs for patient.

## 2015-04-07 NOTE — Telephone Encounter (Signed)
Spoke to daughter   Need order for labs faxed to [facility]  CBC, BMET, BNP, TSH I told daughter to give 2 lasix tomorrow morning   Order labs stat.

## 2015-04-08 ENCOUNTER — Telehealth: Payer: Self-pay | Admitting: Internal Medicine

## 2015-04-08 NOTE — Telephone Encounter (Signed)
Lendon KaMichalene Homero Hyson, RN at 04/08/2015 11:13 AM     Status: Signed       Expand All Collapse All   Called and spoke with deirdra [facility] She has order for labs but they are not able to draw a BNP so [facility] is having their lab come to draw the blood work today. It was ordered stat and is to be called to Dr. Tenny Crawoss.

## 2015-04-08 NOTE — Telephone Encounter (Signed)
New message     receive an order [facility] for stat labs . [facility] @ 9 am  . Please advise

## 2015-04-08 NOTE — Telephone Encounter (Signed)
Called and spoke with Patricia [name] She has order for labs but they are not able to draw a BNP so [facility] is having their lab come to draw the blood work today. It was ordered stat and is to be called to Dr. Tenny Crawoss.

## 2015-04-09 ENCOUNTER — Telehealth: Payer: Self-pay | Admitting: Physician Assistant

## 2015-04-09 NOTE — Telephone Encounter (Signed)
Hospice nurse called regarding labs on Ms. Clements.  Her BNP was elevated at approximately 1000 and her BMET was abnormal as well.  Review the labs with her. Though her BNP is abnormal, it is lower than previous values. Although her renal function is abnormal, it is in line with previous values.  The hospice nurse had discussed the patient with the patient's daughter who sees her mother regularly and feels that she is stable. She does not request any further workup at this time and states that the hospice nurse does not need to come out and see her today.  Continue current therapy.  Theodore DemarkBarrett, Chania Kochanski, Cordelia Poche-C 04/09/2015 5:15 PM [place] [phone]

## 2015-04-11 ENCOUNTER — Encounter: Payer: Self-pay | Admitting: Internal Medicine

## 2015-04-11 ENCOUNTER — Telehealth: Payer: Self-pay

## 2015-04-11 NOTE — Telephone Encounter (Signed)
Labs are in Dr. Charlott Rakesoss's basket for review.

## 2015-04-11 NOTE — Telephone Encounter (Signed)
There is a request for a refill for Lasix 20 mg tab 1qd.. In her chart her Lasix is BID and there was already enough in there with 11 refills given a few month ago. So Today I did not refill it.

## 2015-04-21 ENCOUNTER — Other Ambulatory Visit: Payer: Self-pay

## 2015-04-21 MED ORDER — FUROSEMIDE 20 MG PO TABS: 60.0000 mg | ORAL_TABLET | Freq: Every day | ORAL | Status: AC

## 2015-04-21 NOTE — Telephone Encounter (Signed)
Pt's daughter Rosey Batheresa called states they need a new rx for Lasix 20mg , three tablets each am per Dr Tenny Crawoss [facility].  [facility], but a new rx was not sent to pharmacy and they will not fill, stating given previous sig it is too early to refill.  Per lab results from 04/08/15 in Epic, Dr Tenny Crawoss states pt should take 60mg  Lasix (three 20mg  tablets) each am. Will send in new rx to pharmacy with new instructions to reflect these changes.  Daughter aware rx refill sent to pharmacy with appropriate changes to sig per Dr Tenny Crawoss.

## 2015-05-03 ENCOUNTER — Emergency Department (HOSPITAL_COMMUNITY)
Admission: EM | Admit: 2015-05-03 | Discharge: 2015-05-03 | Disposition: A | Discharge status: Home / Self Care | Payer: Medicare Other | Attending: Emergency Medicine | Admitting: Emergency Medicine

## 2015-05-03 ENCOUNTER — Encounter (HOSPITAL_COMMUNITY): Payer: Self-pay

## 2015-05-03 DIAGNOSIS — Z9889 Other specified postprocedural states: Secondary | ICD-10-CM | POA: Diagnosis not present

## 2015-05-03 DIAGNOSIS — Z862 Personal history of diseases of the blood and blood-forming organs and certain disorders involving the immune mechanism: Secondary | ICD-10-CM | POA: Diagnosis not present

## 2015-05-03 DIAGNOSIS — Z8639 Personal history of other endocrine, nutritional and metabolic disease: Secondary | ICD-10-CM | POA: Insufficient documentation

## 2015-05-03 DIAGNOSIS — R4020 Unspecified coma: Secondary | ICD-10-CM

## 2015-05-03 DIAGNOSIS — R55 Syncope and collapse: Secondary | ICD-10-CM | POA: Insufficient documentation

## 2015-05-03 DIAGNOSIS — I2510 Atherosclerotic heart disease of native coronary artery without angina pectoris: Secondary | ICD-10-CM | POA: Diagnosis not present

## 2015-05-03 DIAGNOSIS — I509 Heart failure, unspecified: Secondary | ICD-10-CM | POA: Diagnosis not present

## 2015-05-03 DIAGNOSIS — Z79899 Other long term (current) drug therapy: Secondary | ICD-10-CM | POA: Insufficient documentation

## 2015-05-03 DIAGNOSIS — Z8742 Personal history of other diseases of the female genital tract: Secondary | ICD-10-CM | POA: Diagnosis not present

## 2015-05-03 DIAGNOSIS — I10 Essential (primary) hypertension: Secondary | ICD-10-CM | POA: Diagnosis not present

## 2015-05-03 DIAGNOSIS — E039 Hypothyroidism, unspecified: Secondary | ICD-10-CM | POA: Insufficient documentation

## 2015-05-03 DIAGNOSIS — R011 Cardiac murmur, unspecified: Secondary | ICD-10-CM | POA: Diagnosis not present

## 2015-05-03 DIAGNOSIS — I4891 Unspecified atrial fibrillation: Secondary | ICD-10-CM | POA: Diagnosis not present

## 2015-05-03 DIAGNOSIS — Z8669 Personal history of other diseases of the nervous system and sense organs: Secondary | ICD-10-CM | POA: Diagnosis not present

## 2015-05-03 DIAGNOSIS — J449 Chronic obstructive pulmonary disease, unspecified: Secondary | ICD-10-CM | POA: Insufficient documentation

## 2015-05-03 DIAGNOSIS — Z87891 Personal history of nicotine dependence: Secondary | ICD-10-CM | POA: Diagnosis not present

## 2015-05-03 LAB — CBC
HEMATOCRIT: 43.2 % (ref 36.0–46.0)
Hemoglobin: 13.4 g/dL (ref 12.0–15.0)
MCH: 30.4 pg (ref 26.0–34.0)
MCHC: 31 g/dL (ref 30.0–36.0)
MCV: 98 fL (ref 78.0–100.0)
PLATELETS: 90 10*3/uL — AB (ref 150–400)
RBC: 4.41 MIL/uL (ref 3.87–5.11)
RDW: 14.6 % (ref 11.5–15.5)
WBC: 8 10*3/uL (ref 4.0–10.5)

## 2015-05-03 LAB — BASIC METABOLIC PANEL
Anion gap: 8 (ref 5–15)
BUN: 29 mg/dL — AB (ref 6–20)
CALCIUM: 9.4 mg/dL (ref 8.9–10.3)
CO2: 31 mmol/L (ref 22–32)
Chloride: 102 mmol/L (ref 101–111)
Creatinine, Ser: 1.58 mg/dL — ABNORMAL HIGH (ref 0.44–1.00)
GFR calc Af Amer: 33 mL/min — ABNORMAL LOW (ref >60)
GFR, EST NON AFRICAN AMERICAN: 29 mL/min — AB (ref >60)
Glucose, Bld: 119 mg/dL — ABNORMAL HIGH (ref 65–99)
POTASSIUM: 4.4 mmol/L (ref 3.5–5.1)
SODIUM: 141 mmol/L (ref 135–145)

## 2015-05-03 LAB — CBG MONITORING, ED: GLUCOSE-CAPILLARY: 111 mg/dL — AB (ref 65–99)

## 2015-05-03 NOTE — ED Provider Notes (Signed)
CSN: [id number]     Arrival date & time 05/03/15  1854 History   First MD Initiated Contact with Patient 05/03/15 1911     Chief Complaint  Patient presents with  . Loss of Consciousness     (Consider location/radiation/quality/duration/timing/severity/associated sxs/prior Treatment) HPI Patient presents after an episode of syncope versus seizure/change in cognition. Patient is a nursing home resident, is on hospice care due to COPD. However, the patient states that she has been in generally good health recently, with no recent illness, cough, fever, chills, nausea, vomiting. Today, the patient went out for lunch. Upon returning to her nursing facility, she forgot to switch from oxygen canister to a compressor. She took a nap. Soon after awakening, while in her wheelchair she had an episode of almost complete loss of consciousness. She did not fall from the chair, she states that she perceived that she could hear everything around her. Upon recovering, the patient recalls no chest pain, headache, confusion, disorientation, and almost immediate return to appropriate interactivity. Bystanders report the patient was shaking during her episode in the chair, which lasted for several moments. Patient denies history of seizures. Family members concur that the patient returned to baseline was immediately after the event. Patient was sent here for evaluation  Past Medical History  Diagnosis Date  . CAD (coronary artery disease)   . Mitral valve disorder     a. s/p MVR;  b. 06/2013 Echo: EF 55-60%, no rwma, Ao sclerosis, mild AI, mild MR, miod dil LA, sev dil RV with reduced fxn, sev dil RA, mod TR/PR, PASP (study reviewed by Dr. Verlin Fester- MS felt to be sev, peak grad 39/mean 13).  [name] HTN (hypertension)   . Dyslipidemia   . Aortic stenosis, mild   . Breast cyst   . Hypertrophic cardiomyopathy (HCC)   . Sleep apnea   . COPD (chronic obstructive pulmonary disease) (HCC)     "a touch of"; prn  o2 at home with activity   . Atrial fibrillation (HCC)     a. chronic coumadin.  [name] Hypothyroidism   . Anemia   . Chronic right-sided heart failure [facility])    Past Surgical History  Procedure Laterality Date  . Laparoscopic cholecystectomy  2009  . Cataract extraction  2003    bilateral  . Mitral valve replacement  2007  . Neck mass excision  2003    benign  . Colonoscopy    . Broken wrist      left, metal implanted   . Broken ankle      right, metal implanted   . Cardiac catheterization  06/2011  . Breast surgery      bilateral cysts x3 removed, benign   . Tee without cardioversion  01/31/2012    Procedure: TRANSESOPHAGEAL ECHOCARDIOGRAM (TEE);  Surgeon: Pricilla Riffle, MD;  Location: [facility] ENDOSCOPY;  Service: Cardiovascular;  Laterality: N/A;  . Cardioversion N/A 01/16/2013    Procedure: CARDIOVERSION;  Surgeon: Vesta Mixer, MD;  Location: [facility] ENDOSCOPY;  Service: Cardiovascular;  Laterality: N/A;   Family History  Problem Relation Age of Onset  . Diabetes Mother   . CVA Mother   . Other Father   . Diabetes Brother   . Cancer Brother     throat ca, smoker  . Aneurysm Brother     brain aneurysm  . Breast cancer Sister   . Emphysema Sister     smoker   Social History  Substance Use Topics  . Smoking status: Former Smoker --  1.00 packs/day for 40 years    Types: Cigarettes    Quit date: 05/21/1990  . Smokeless tobacco: Never Used  . Alcohol Use: No   OB History    No data available     Review of Systems  Constitutional:       Per HPI, otherwise negative  HENT:       Per HPI, otherwise negative  Respiratory:       Chronic resp distress, on 24/7 home O2  Cardiovascular:       Per HPI, otherwise negative  Gastrointestinal: Negative for vomiting.  Endocrine:       Negative aside from HPI  Genitourinary:       Neg aside from HPI   Musculoskeletal:       Per HPI, otherwise negative  Skin: Negative.   Neurological: Positive for syncope. Negative for tremors,  seizures, facial asymmetry, speech difficulty, weakness, light-headedness, numbness and headaches.  Hematological: Bruises/bleeds easily.      Allergies  Ivp dye; Nitrofurantoin; and Ramipril  Home Medications   Prior to Admission medications   Medication Sig Start Date End Date Taking? Authorizing Provider  acetaminophen (TYLENOL) 325 MG tablet Take 2 tablets (650 mg total) by mouth every 6 (six) hours as needed for mild pain (or Fever >/= 101). 11/08/14   Shanker Levora Dredge, MD  albuterol (PROVENTIL HFA;VENTOLIN HFA) 108 (90 BASE) MCG/ACT inhaler Inhale 2 puffs into the lungs every 6 (six) hours as needed for wheezing or shortness of breath. 11/08/14   Maretta Bees, MD  amiodarone (PACERONE) 200 MG tablet Take 0.5 tablets (100 mg total) by mouth daily. 07/08/14   Pricilla Riffle, MD  Calcium Carb-Cholecalciferol (CALCIUM + D3) 600-200 MG-UNIT TABS Take 1 tablet by mouth 2 (two) times daily.    Historical Provider, MD  cholecalciferol (VITAMIN D) 1000 UNITS tablet Take 1,000 Units by mouth at bedtime.     Historical Provider, MD  furosemide (LASIX) 20 MG tablet Take 3 tablets (60 mg total) by mouth daily. In the am 04/21/15   Pricilla Riffle, MD  levothyroxine (SYNTHROID, LEVOTHROID) 125 MCG tablet Take 62.5 mcg by mouth daily before breakfast.    Historical Provider, MD  LORazepam (ATIVAN) 0.5 MG tablet Take 2 tablets (1 mg total) by mouth every 2 (two) hours as needed for anxiety or seizure. 11/08/14   Shanker Levora Dredge, MD  metoprolol tartrate (LOPRESSOR) 25 MG tablet Take 1 tablet (25 mg total) by mouth 2 (two) times daily. 09/21/13   Pricilla Riffle, MD  nitroGLYCERIN (NITROSTAT) 0.4 MG SL tablet Place 0.4 mg under the tongue every 5 (five) minutes as needed for chest pain.    Historical Provider, MD  ondansetron (ZOFRAN) 4 MG tablet Take 4 mg by mouth every 8 (eight) hours as needed for nausea or vomiting.    Historical Provider, MD  potassium chloride SA (K-DUR,KLOR-CON) 20 MEQ tablet Take 1  tablet (20 mEq total) by mouth 2 (two) times daily. 01/26/15   Pricilla Riffle, MD  Psyllium (FIBER) 0.52 G CAPS Take 2 capsules by mouth at bedtime.    Historical Provider, MD  sertraline (ZOLOFT) 100 MG tablet Take 100 mg by mouth daily.    Historical Provider, MD   BP 101/62 mmHg  Pulse 68  Temp(Src) 98.1 F (36.7 C) (Oral)  Resp 21  Ht  (1.626 m)  Wt 147 lb (66.679 kg)  BMI 25.22 kg/m2  SpO2 98% Physical Exam  Constitutional: She is oriented to  person, place, and time. She appears well-developed and well-nourished. No distress.  HENT:  Head: Normocephalic and atraumatic.  Eyes: Conjunctivae and EOM are normal.  Cardiovascular: Normal rate and intact distal pulses.  An irregularly irregular rhythm present.  Murmur heard. Pulmonary/Chest: Effort normal. No stridor. She has decreased breath sounds. She has no wheezes.  Abdominal: She exhibits no distension.  Musculoskeletal: She exhibits no edema or tenderness.  Neurological: She is alert and oriented to person, place, and time. No cranial nerve deficit.  Skin: Skin is warm and dry.  Psychiatric: She has a normal mood and affect.  Nursing note and vitals reviewed.   ED Course  Procedures (including critical care time) Labs Review Labs Reviewed  BASIC METABOLIC PANEL - Abnormal; Notable for the following:    Glucose, Bld 119 (*)    BUN 29 (*)    Creatinine, Ser 1.58 (*)    GFR calc non Af Amer 29 (*)    GFR calc Af Amer 33 (*)    All other components within normal limits  CBC - Abnormal; Notable for the following:    Platelets 90 (*)    All other components within normal limits  CBG MONITORING, ED - Abnormal; Notable for the following:    Glucose-Capillary 111 (*)    All other components within normal limits  URINALYSIS, ROUTINE W REFLEX MICROSCOPIC (NOT AT [facility])    Imaging Review No results found. I have personally reviewed and evaluated these images and lab results as part of my medical decision-making.   EKG  Interpretation   Date/Time:  Tuesday May 03 2015 19:07:38 EST Ventricular Rate:  67 PR Interval:    QRS Duration: 134 QT Interval:  508 QTC Calculation: 536 R Axis:   -72 Text Interpretation:  Atrial fibrillation Left bundle branch block Atrial  fibrillation Left bundle branch block No significant change since last  tracing Confirmed by Gerhard MunchLOCKWOOD, Bobbye Reinitz  MD [phone]) on 05/03/2015 7:48:52 PM     Cardiac 80 A. fib abnormal Pulse ox 96% with supplemental oxygen abnormal   EMR review notable for Hx of COPD, MVR,   9:04 PM Patient in no distress, no new complaints, no additional syncope, nor any seizure activity. I again discussed all thoughts with the patient and family member. We agreed that the patient is appropriate for discharge, with outpatient follow-up. MDM  Pleasant elderly female presents from her nursing facility after episode of loss of consciousness. The patient was reported to have shaking during the event, there is no postictal phase, no program, and the patient has no history of seizures. Patient does have a history of episodic syncope. This was likely the case again. Patient discharged in stable condition to follow-up with outpatient primary care.  Gerhard Munchobert Ark Agrusa, MD 05/03/15 2105

## 2015-05-03 NOTE — Discharge Instructions (Signed)
As discussed, your evaluation today has been largely reassuring.  But, it is important that you monitor your condition carefully, and do not hesitate to return to the ED if you develop new, or concerning changes in your condition. ? ?Otherwise, please follow-up with your physician for appropriate ongoing care. ? ?

## 2015-05-03 NOTE — ED Notes (Signed)
Pt brought in by EMS from [facility].  Pt reports she has a hx of syncopal episodes that they believe is from hypoxia.  Pt is on 6L of O2 at home.  Staff reports that pt was in wheelchair going to supper when she lost consciousness for less than minute and, per staff, pt was jerking while unconscious.  Pt remained in chair during episode.  No trauma, incontinence or oral trauma.  Pt reports that she remembers everything up to the episode and everything immediately following.  Pt is A&Ox4 upon arrival and in NAD.

## 2015-05-05 ENCOUNTER — Ambulatory Visit (INDEPENDENT_AMBULATORY_CARE_PROVIDER_SITE_OTHER): Payer: Medicare Other | Admitting: Internal Medicine

## 2015-05-05 ENCOUNTER — Encounter: Payer: Self-pay | Admitting: Internal Medicine

## 2015-05-05 VITALS — BP 108/56 | HR 78 | Ht 64.0 in | Wt 152.1 lb

## 2015-05-05 DIAGNOSIS — I5032 Chronic diastolic (congestive) heart failure: Secondary | ICD-10-CM

## 2015-05-05 LAB — BASIC METABOLIC PANEL
BUN: 27 mg/dL — ABNORMAL HIGH (ref 7–25)
CALCIUM: 9.3 mg/dL (ref 8.6–10.4)
CO2: 31 mmol/L (ref 20–31)
Chloride: 97 mmol/L — ABNORMAL LOW (ref 98–110)
Creat: 1.58 mg/dL — ABNORMAL HIGH (ref 0.60–0.88)
GLUCOSE: 93 mg/dL (ref 65–99)
Potassium: 4.2 mmol/L (ref 3.5–5.3)
SODIUM: 141 mmol/L (ref 135–146)

## 2015-05-05 LAB — BRAIN NATRIURETIC PEPTIDE: Brain Natriuretic Peptide: 861.5 pg/mL — ABNORMAL HIGH (ref 0.0–100.0)

## 2015-05-05 NOTE — Progress Notes (Signed)
Cardiology Office Note   Date:  05/05/2015   ID:  Patricia [name], DOB 1927-04-13, MRN [id number]  PCP:  Patricia Moh, MD  Cardiologist:   Dietrich Pates, MD    F/U of atrial fib, pulmonary HTN     History of Present Illness: Patricia [name] is a 81 y.o. female with a history of HOCM, atrial fib, mitral stenosis (s/p MVR), midl CAD  Pulmnary HTN  Type B aortic dissection  I saw her in Oct 40981  She remains with hospice and lives at [facility]  Breathing is better now that she is on 5L continuous O2   She had a spell of near syncope a couple days ago  Napped in chair without oxygen  Woke  Wnet to dinner without oxygen on  In elevator  Could hear but couldn't react Went away on own       Current Outpatient Prescriptions  Medication Sig Dispense Refill  . acetaminophen (TYLENOL) 325 MG tablet Take 2 tablets (650 mg total) by mouth every 6 (six) hours as needed for mild pain (or Fever >/= 101).    Patricia [name] amiodarone (PACERONE) 100 MG tablet Take 100 mg by mouth daily.    . Calcium Carb-Cholecalciferol (CALCIUM + D3) 600-200 MG-UNIT TABS Take 1 tablet by mouth 2 (two) times daily.    . cholecalciferol (VITAMIN D) 1000 UNITS tablet Take 1,000 Units by mouth at bedtime.     . cyclobenzaprine (FLEXERIL) 5 MG tablet Take 5 mg by mouth 3 (three) times daily as needed for muscle spasms.    . furosemide (LASIX) 20 MG tablet Take 3 tablets (60 mg total) by mouth daily. In the am 90 tablet 11  . levothyroxine (SYNTHROID, LEVOTHROID) 125 MCG tablet Take 62.5 mcg by mouth daily before breakfast.    . LORazepam (ATIVAN) 0.5 MG tablet Take 2 tablets (1 mg total) by mouth every 2 (two) hours as needed for anxiety or seizure. 15 tablet 0  . metoprolol tartrate (LOPRESSOR) 25 MG tablet Take 1 tablet (25 mg total) by mouth 2 (two) times daily. 60 tablet 12  . nitroGLYCERIN (NITROSTAT) 0.4 MG SL tablet Place 0.4 mg under the tongue every 5 (five) minutes as needed for chest pain.    Patricia [name] ondansetron (ZOFRAN)  4 MG tablet Take 4 mg by mouth every 8 (eight) hours as needed for nausea or vomiting.    . OXYGEN Inhale 2 L into the lungs continuous.    . polycarbophil (FIBERCON) 625 MG tablet Take 625 mg by mouth daily.    . potassium chloride SA (K-DUR,KLOR-CON) 20 MEQ tablet Take 1 tablet (20 mEq total) by mouth 2 (two) times daily. 60 tablet 11  . sertraline (ZOLOFT) 100 MG tablet Take 100 mg by mouth daily.     No current facility-administered medications for this visit.    Allergies:   Ivp dye; Nitrofurantoin; and Ramipril   Past Medical History  Diagnosis Date  . CAD (coronary artery disease)   . Mitral valve disorder     a. s/p MVR;  b. 06/2013 Echo: EF 55-60%, no rwma, Ao sclerosis, mild AI, mild MR, miod dil LA, sev dil RV with reduced fxn, sev dil RA, mod TR/PR, PASP (study reviewed by Dr. Verlin Fester- MS felt to be sev, peak grad 39/mean 13).  Patricia [name] HTN (hypertension)   . Dyslipidemia   . Aortic stenosis, mild   . Breast cyst   . Hypertrophic cardiomyopathy (HCC)   . Sleep apnea   .  COPD (chronic obstructive pulmonary disease) (HCC)     "a touch of"; prn o2 at home with activity   . Atrial fibrillation (HCC)     a. chronic coumadin.  Patricia [name]. Hypothyroidism   . Anemia   . Chronic right-sided heart failure [facility](HCC)     Past Surgical History  Procedure Laterality Date  . Laparoscopic cholecystectomy  2009  . Cataract extraction  2003    bilateral  . Mitral valve replacement  2007  . Neck mass excision  2003    benign  . Colonoscopy    . Broken wrist      left, metal implanted   . Broken ankle      right, metal implanted   . Cardiac catheterization  06/2011  . Breast surgery      bilateral cysts x3 removed, benign   . Tee without cardioversion  01/31/2012    Procedure: TRANSESOPHAGEAL ECHOCARDIOGRAM (TEE);  Surgeon: Pricilla RifflePaula V Dosia Yodice, MD;  [facility];  Service: Cardiovascular;  Laterality: N/A;  . Cardioversion N/A 01/16/2013    Procedure: CARDIOVERSION;  Surgeon: Vesta MixerPhilip J Nahser,  MD;  [facility];  Service: Cardiovascular;  Laterality: N/A;     Social History:  The patient  reports that she quit smoking about 24 years ago. Her smoking use included Cigarettes. She has a 40 pack-year smoking history. She has never used smokeless tobacco. She reports that she does not drink alcohol or use illicit drugs.   Family History:  The patient's family history includes Aneurysm in her brother; Breast cancer in her sister; CVA in her mother; Cancer in her brother; Diabetes in her brother and mother; Emphysema in her sister; Other in her father.    ROS:  Please see the history of present illness. All other systems are reviewed and  Negative to the above problem except as noted.    PHYSICAL EXAM: VS:  BP 108/56 mmHg  Pulse 78  Ht 5\' 4"  (1.626 m)  Wt 69.001 kg (152 lb 1.9 oz)  BMI 26.10 kg/m2  SpO2 99%  GEN: Well nourished, well developed, in no acute distress HEENT: normal Neck: no JVD, carotid bruits, or masses Cardiac: RRR; no murmurs, rubs, or gallops,1+ edema  Respiratory:  clear to auscultation bilaterally, normal work of breathing GI: soft, nontender, nondistended, + BS  No hepatomegaly  MS: no deformity Moving all extremities   Skin: warm and dry, no rash Neuro:  Strength and sensation are intact Psych: euthymic mood, full affect   EKG:  EKG is nt ordered today.   Lipid Panel    Component Value Date/Time   CHOL 156 07/13/2013 0130   TRIG 79 07/13/2013 0130   TRIG 128 04/22/2006 0839   HDL 31* 07/13/2013 0130   CHOLHDL 5.0 07/13/2013 0130   CHOLHDL 3.7 CALC 04/22/2006 0839   VLDL 16 07/13/2013 0130   LDLCALC 109* 07/13/2013 0130      Wt Readings from Last 3 Encounters:  05/05/15 69.001 kg (152 lb 1.9 oz)  05/03/15 66.679 kg (147 lb)  02/22/15 71.442 kg (157 lb 8 oz)      ASSESSMENT AND PLAN:  1.  MV disease with pulmonary HTN  Plan to continue current edical Rx    Volume satus is OK   2.  CAD  Mild    3.  HOCM  BP is OK  One  episode of near syncope.  Up with O2 and assistence    Signed, Dietrich PatesPaula Nobie Alleyne, MD  05/05/2015 11:29 AM  [facility] [address], [place], [place]  [address] Phone: [phone]; Fax: [phone]

## 2015-05-05 NOTE — Patient Instructions (Signed)
Your physician recommends that you continue on your current medications as directed. Please refer to the Current Medication list given to you today. Your physician recommends that you return for lab work today (BMET, BNP) Follow up with your physician will depend on test results.

## 2015-05-31 ENCOUNTER — Telehealth: Payer: Self-pay

## 2015-05-31 NOTE — Telephone Encounter (Signed)
Spoke to GirardErika (pts granddaughter)  HR has beein in 40s  Pt dizzy   Would recomm 1.  Decrease metoprolol to 12.5 1x per day for a few days then stop 2.  Hold amiodarone for a few days Instructed granddaughter to call back with HR and symptoms

## 2015-05-31 NOTE — Telephone Encounter (Signed)
Pt is having increased frequency of pre syncopal episodes.  Has had 3 episodes in past hour, HR dropping to 40-42 during episodes.  Seems these maybe related.  Please advise if rate controlling meds need adjusting to attempt to prevent or lessen pre syncopal episodes. Or call with any other recommendations.  Thanks

## 2015-05-31 NOTE — Telephone Encounter (Signed)
Spoke with pt's daughter states BP just checked 98/65.  Per report BP has been running consistently in 90's/60s.  [facility] to Decrease Metoprolol to 12.5mg  QD x 3 days then stop.  Hold Amiodarone until further notice.  Order given to have BP and HR checked TID (am, noon, and pm) with results recorded and to be faxed to Dr Tenny Crawoss office on Friday 06/03/15.  Will await results from BP and HR checks for further medication adjustment if needed.

## 2015-06-06 ENCOUNTER — Encounter: Payer: Self-pay | Admitting: Internal Medicine

## 2015-06-06 ENCOUNTER — Telehealth: Payer: Self-pay | Admitting: *Deleted

## 2015-06-06 NOTE — Telephone Encounter (Signed)
[facility] for amiodarone 100 mg daily, check BP and HR daily x 1 week and fax results to Dr. Tenny Crawoss.

## 2015-06-06 NOTE — Telephone Encounter (Signed)
Labs received and scanned in for Dr. Tenny Crawoss review.  Pt BP and HR records since decreasing and then discontinuing metoprolol : 134/87 highest; 92/58 lowest.  HR ranges 51-87. Patient's granddaughter aware. Patient taking lasix 60 mg daily. She is also receiving ativan .25 mg as needed and has been doing well taking this at night Has not restarted amiodarone at this time. Forwarding to Dr. Tenny Crawoss for review; any medication changes.

## 2015-06-06 NOTE — Telephone Encounter (Signed)
Keep on current regimen   Restart amiodarone 100 mg Follow HR and BP Pt should be due for labs.

## 2015-07-13 ENCOUNTER — Inpatient Hospital Stay (HOSPITAL_COMMUNITY)
Admission: EM | Admit: 2015-07-13 | Discharge: 2015-07-20 | DRG: 309 | Disposition: E | Payer: Medicare Other | Attending: Internal Medicine | Admitting: Internal Medicine

## 2015-07-13 ENCOUNTER — Other Ambulatory Visit (HOSPITAL_COMMUNITY): Payer: Medicare Other

## 2015-07-13 ENCOUNTER — Emergency Department (HOSPITAL_COMMUNITY): Payer: Medicare Other

## 2015-07-13 ENCOUNTER — Encounter (HOSPITAL_COMMUNITY): Payer: Self-pay | Admitting: Cardiology

## 2015-07-13 DIAGNOSIS — Z9841 Cataract extraction status, right eye: Secondary | ICD-10-CM | POA: Diagnosis not present

## 2015-07-13 DIAGNOSIS — I4729 Other ventricular tachycardia: Secondary | ICD-10-CM

## 2015-07-13 DIAGNOSIS — I2510 Atherosclerotic heart disease of native coronary artery without angina pectoris: Secondary | ICD-10-CM | POA: Diagnosis present

## 2015-07-13 DIAGNOSIS — E039 Hypothyroidism, unspecified: Secondary | ICD-10-CM | POA: Diagnosis present

## 2015-07-13 DIAGNOSIS — I472 Ventricular tachycardia, unspecified: Secondary | ICD-10-CM | POA: Insufficient documentation

## 2015-07-13 DIAGNOSIS — Z7901 Long term (current) use of anticoagulants: Secondary | ICD-10-CM | POA: Diagnosis not present

## 2015-07-13 DIAGNOSIS — E785 Hyperlipidemia, unspecified: Secondary | ICD-10-CM | POA: Diagnosis present

## 2015-07-13 DIAGNOSIS — I482 Chronic atrial fibrillation, unspecified: Secondary | ICD-10-CM | POA: Diagnosis present

## 2015-07-13 DIAGNOSIS — G4730 Sleep apnea, unspecified: Secondary | ICD-10-CM | POA: Diagnosis present

## 2015-07-13 DIAGNOSIS — I5032 Chronic diastolic (congestive) heart failure: Secondary | ICD-10-CM | POA: Diagnosis present

## 2015-07-13 DIAGNOSIS — R739 Hyperglycemia, unspecified: Secondary | ICD-10-CM | POA: Diagnosis present

## 2015-07-13 DIAGNOSIS — J449 Chronic obstructive pulmonary disease, unspecified: Secondary | ICD-10-CM | POA: Diagnosis present

## 2015-07-13 DIAGNOSIS — J9610 Chronic respiratory failure, unspecified whether with hypoxia or hypercapnia: Secondary | ICD-10-CM | POA: Diagnosis present

## 2015-07-13 DIAGNOSIS — R05 Cough: Secondary | ICD-10-CM

## 2015-07-13 DIAGNOSIS — R569 Unspecified convulsions: Secondary | ICD-10-CM | POA: Diagnosis not present

## 2015-07-13 DIAGNOSIS — Z66 Do not resuscitate: Secondary | ICD-10-CM | POA: Diagnosis present

## 2015-07-13 DIAGNOSIS — Z7189 Other specified counseling: Secondary | ICD-10-CM | POA: Insufficient documentation

## 2015-07-13 DIAGNOSIS — I350 Nonrheumatic aortic (valve) stenosis: Secondary | ICD-10-CM | POA: Diagnosis present

## 2015-07-13 DIAGNOSIS — J9611 Chronic respiratory failure with hypoxia: Secondary | ICD-10-CM | POA: Diagnosis present

## 2015-07-13 DIAGNOSIS — Z9842 Cataract extraction status, left eye: Secondary | ICD-10-CM | POA: Diagnosis not present

## 2015-07-13 DIAGNOSIS — D696 Thrombocytopenia, unspecified: Secondary | ICD-10-CM | POA: Diagnosis present

## 2015-07-13 DIAGNOSIS — I272 Other secondary pulmonary hypertension: Secondary | ICD-10-CM | POA: Diagnosis present

## 2015-07-13 DIAGNOSIS — Z515 Encounter for palliative care: Secondary | ICD-10-CM | POA: Diagnosis not present

## 2015-07-13 DIAGNOSIS — Z9981 Dependence on supplemental oxygen: Secondary | ICD-10-CM | POA: Diagnosis not present

## 2015-07-13 DIAGNOSIS — R001 Bradycardia, unspecified: Secondary | ICD-10-CM | POA: Diagnosis not present

## 2015-07-13 DIAGNOSIS — I421 Obstructive hypertrophic cardiomyopathy: Secondary | ICD-10-CM | POA: Diagnosis present

## 2015-07-13 DIAGNOSIS — I119 Hypertensive heart disease without heart failure: Secondary | ICD-10-CM | POA: Diagnosis present

## 2015-07-13 DIAGNOSIS — Z953 Presence of xenogenic heart valve: Secondary | ICD-10-CM | POA: Diagnosis not present

## 2015-07-13 DIAGNOSIS — R059 Cough, unspecified: Secondary | ICD-10-CM

## 2015-07-13 DIAGNOSIS — I110 Hypertensive heart disease with heart failure: Secondary | ICD-10-CM | POA: Diagnosis present

## 2015-07-13 DIAGNOSIS — N179 Acute kidney failure, unspecified: Secondary | ICD-10-CM | POA: Diagnosis present

## 2015-07-13 DIAGNOSIS — Z87891 Personal history of nicotine dependence: Secondary | ICD-10-CM | POA: Diagnosis not present

## 2015-07-13 LAB — CBC WITH DIFFERENTIAL/PLATELET
BASOS ABS: 0 10*3/uL (ref 0.0–0.1)
BASOS PCT: 0 %
Eosinophils Absolute: 0 10*3/uL (ref 0.0–0.7)
Eosinophils Relative: 0 %
HEMATOCRIT: 41.5 % (ref 36.0–46.0)
HEMOGLOBIN: 13.1 g/dL (ref 12.0–15.0)
Lymphocytes Relative: 3 %
Lymphs Abs: 0.2 10*3/uL — ABNORMAL LOW (ref 0.7–4.0)
MCH: 30.5 pg (ref 26.0–34.0)
MCHC: 31.6 g/dL (ref 30.0–36.0)
MCV: 96.7 fL (ref 78.0–100.0)
Monocytes Absolute: 0.3 10*3/uL (ref 0.1–1.0)
Monocytes Relative: 4 %
NEUTROS ABS: 7 10*3/uL (ref 1.7–7.7)
NEUTROS PCT: 92 %
Platelets: 68 10*3/uL — ABNORMAL LOW (ref 150–400)
RBC: 4.29 MIL/uL (ref 3.87–5.11)
RDW: 15.2 % (ref 11.5–15.5)
WBC: 7.6 10*3/uL (ref 4.0–10.5)

## 2015-07-13 LAB — MAGNESIUM: Magnesium: 2.1 mg/dL (ref 1.7–2.4)

## 2015-07-13 LAB — I-STAT TROPONIN, ED: Troponin i, poc: 0.05 ng/mL (ref 0.00–0.08)

## 2015-07-13 LAB — COMPREHENSIVE METABOLIC PANEL
ALK PHOS: 59 U/L (ref 38–126)
ALT: 11 U/L — ABNORMAL LOW (ref 14–54)
ANION GAP: 12 (ref 5–15)
AST: 22 U/L (ref 15–41)
Albumin: 3.5 g/dL (ref 3.5–5.0)
BILIRUBIN TOTAL: 0.9 mg/dL (ref 0.3–1.2)
BUN: 28 mg/dL — ABNORMAL HIGH (ref 6–20)
CALCIUM: 9.5 mg/dL (ref 8.9–10.3)
CO2: 26 mmol/L (ref 22–32)
Chloride: 102 mmol/L (ref 101–111)
Creatinine, Ser: 1.53 mg/dL — ABNORMAL HIGH (ref 0.44–1.00)
GFR calc non Af Amer: 30 mL/min — ABNORMAL LOW (ref >60)
GFR, EST AFRICAN AMERICAN: 34 mL/min — AB (ref >60)
Glucose, Bld: 155 mg/dL — ABNORMAL HIGH (ref 65–99)
Potassium: 4.8 mmol/L (ref 3.5–5.1)
Sodium: 140 mmol/L (ref 135–145)
TOTAL PROTEIN: 7 g/dL (ref 6.5–8.1)

## 2015-07-13 LAB — I-STAT CG4 LACTIC ACID, ED
Lactic Acid, Venous: 1.8 mmol/L (ref 0.5–2.0)
Lactic Acid, Venous: 1.65 mmol/L (ref 0.5–2.0)

## 2015-07-13 LAB — URINALYSIS, ROUTINE W REFLEX MICROSCOPIC
BILIRUBIN URINE: NEGATIVE
Glucose, UA: NEGATIVE mg/dL
HGB URINE DIPSTICK: NEGATIVE
Ketones, ur: NEGATIVE mg/dL
Nitrite: NEGATIVE
Protein, ur: 30 mg/dL — AB
Specific Gravity, Urine: 1.018 (ref 1.005–1.030)
pH: 6 (ref 5.0–8.0)

## 2015-07-13 LAB — I-STAT CHEM 8, ED
BUN: 30 mg/dL — AB (ref 6–20)
CREATININE: 1.5 mg/dL — AB (ref 0.44–1.00)
Calcium, Ion: 1.1 mmol/L — ABNORMAL LOW (ref 1.13–1.30)
Chloride: 101 mmol/L (ref 101–111)
Glucose, Bld: 146 mg/dL — ABNORMAL HIGH (ref 65–99)
HEMATOCRIT: 43 % (ref 36.0–46.0)
HEMOGLOBIN: 14.6 g/dL (ref 12.0–15.0)
POTASSIUM: 4.6 mmol/L (ref 3.5–5.1)
Sodium: 141 mmol/L (ref 135–145)
TCO2: 26 mmol/L (ref 0–100)

## 2015-07-13 LAB — URINE MICROSCOPIC-ADD ON

## 2015-07-13 MED ORDER — ONDANSETRON HCL 4 MG PO TABS: 4.0000 mg | ORAL_TABLET | Freq: Four times a day (QID) | ORAL | Status: DC | PRN

## 2015-07-13 MED ORDER — BISACODYL 10 MG RE SUPP: 10.0000 mg | Freq: Every day | RECTAL | Status: DC | PRN

## 2015-07-13 MED ORDER — ACETAMINOPHEN 325 MG PO TABS: 650.0000 mg | ORAL_TABLET | Freq: Four times a day (QID) | ORAL | Status: DC | PRN

## 2015-07-13 MED ORDER — HALOPERIDOL 1 MG PO TABS: 0.5000 mg | ORAL_TABLET | ORAL | Status: DC | PRN

## 2015-07-13 MED ORDER — WITCH HAZEL-GLYCERIN EX PADS: MEDICATED_PAD | CUTANEOUS | Status: DC | PRN

## 2015-07-13 MED ORDER — DIPHENHYDRAMINE HCL 50 MG/ML IJ SOLN: 12.5000 mg | INTRAMUSCULAR | Status: DC | PRN

## 2015-07-13 MED ORDER — ACETAMINOPHEN 650 MG RE SUPP: 650.0000 mg | Freq: Four times a day (QID) | RECTAL | Status: DC | PRN

## 2015-07-13 MED ORDER — AMIODARONE HCL IN DEXTROSE 360-4.14 MG/200ML-% IV SOLN: 30.0000 mg/h | INTRAVENOUS | Status: DC

## 2015-07-13 MED ORDER — MAGNESIUM SULFATE 2 GM/50ML IV SOLN
2.0000 g | INTRAVENOUS | Status: AC
  Administered 2015-07-13: 2 g via INTRAVENOUS
  Filled 2015-07-13: qty 50

## 2015-07-13 MED ORDER — CALCIUM POLYCARBOPHIL 625 MG PO TABS: 1250.0000 mg | ORAL_TABLET | Freq: Every day | ORAL | Status: DC

## 2015-07-13 MED ORDER — ALUM & MAG HYDROXIDE-SIMETH 200-200-20 MG/5ML PO SUSP: 30.0000 mL | Freq: Four times a day (QID) | ORAL | Status: DC | PRN

## 2015-07-13 MED ORDER — LORAZEPAM 2 MG/ML IJ SOLN: 0.5000 mg | Freq: Once | INTRAMUSCULAR | Status: DC

## 2015-07-13 MED ORDER — INSULIN ASPART 100 UNIT/ML ~~LOC~~ SOLN: 0.0000 [IU] | Freq: Three times a day (TID) | SUBCUTANEOUS | Status: DC

## 2015-07-13 MED ORDER — AMIODARONE LOAD VIA INFUSION: 150.0000 mg | Freq: Once | INTRAVENOUS | Status: DC

## 2015-07-13 MED ORDER — ALBUTEROL SULFATE (2.5 MG/3ML) 0.083% IN NEBU
2.5000 mg | INHALATION_SOLUTION | Freq: Four times a day (QID) | RESPIRATORY_TRACT | Status: DC
  Filled 2015-07-13 (×2): qty 3

## 2015-07-13 MED ORDER — POLYVINYL ALCOHOL 1.4 % OP SOLN: 1.0000 [drp] | Freq: Four times a day (QID) | OPHTHALMIC | Status: DC | PRN

## 2015-07-13 MED ORDER — LORAZEPAM 2 MG/ML IJ SOLN
0.5000 mg | Freq: Once | INTRAMUSCULAR | Status: AC
  Administered 2015-07-13: 0.5 mg via INTRAVENOUS
  Filled 2015-07-13: qty 1

## 2015-07-13 MED ORDER — POLYETHYLENE GLYCOL 3350 17 G PO PACK
17.0000 g | PACK | Freq: Every day | ORAL | Status: DC | PRN
Start: 2015-07-13 — End: 2015-07-14

## 2015-07-13 MED ORDER — TRAMADOL HCL 50 MG PO TABS: 50.0000 mg | ORAL_TABLET | Freq: Four times a day (QID) | ORAL | Status: DC | PRN

## 2015-07-13 MED ORDER — HYDROCODONE-ACETAMINOPHEN 5-325 MG PO TABS: 1.0000 | ORAL_TABLET | ORAL | Status: DC | PRN

## 2015-07-13 MED ORDER — SODIUM CHLORIDE 0.9% FLUSH: 3.0000 mL | Freq: Two times a day (BID) | INTRAVENOUS | Status: DC

## 2015-07-13 MED ORDER — LEVOTHYROXINE SODIUM 125 MCG PO TABS: 62.5000 ug | ORAL_TABLET | Freq: Every day | ORAL | Status: DC

## 2015-07-13 MED ORDER — MORPHINE BOLUS VIA INFUSION
2.0000 mg | INTRAVENOUS | Status: DC | PRN
  Filled 2015-07-13: qty 2

## 2015-07-13 MED ORDER — SODIUM CHLORIDE 0.9 % IV SOLN
INTRAVENOUS | Status: AC
Start: 2015-07-13 — End: 2015-07-13

## 2015-07-13 MED ORDER — SERTRALINE HCL 100 MG PO TABS: 100.0000 mg | ORAL_TABLET | Freq: Every day | ORAL | Status: DC

## 2015-07-13 MED ORDER — ONDANSETRON HCL 4 MG/2ML IJ SOLN: 4.0000 mg | Freq: Four times a day (QID) | INTRAMUSCULAR | Status: DC | PRN

## 2015-07-13 MED ORDER — ONDANSETRON 4 MG PO TBDP: 4.0000 mg | ORAL_TABLET | Freq: Four times a day (QID) | ORAL | Status: DC | PRN

## 2015-07-13 MED ORDER — LORAZEPAM 2 MG/ML IJ SOLN: 1.0000 mg | INTRAMUSCULAR | Status: DC | PRN

## 2015-07-13 MED ORDER — AMIODARONE HCL IN DEXTROSE 360-4.14 MG/200ML-% IV SOLN: 60.0000 mg/h | INTRAVENOUS | Status: DC

## 2015-07-13 MED ORDER — ALBUTEROL SULFATE HFA 108 (90 BASE) MCG/ACT IN AERS: 2.0000 | INHALATION_SPRAY | Freq: Four times a day (QID) | RESPIRATORY_TRACT | Status: DC | PRN

## 2015-07-13 MED ORDER — TEMAZEPAM 7.5 MG PO CAPS: 7.5000 mg | ORAL_CAPSULE | Freq: Every evening | ORAL | Status: DC | PRN

## 2015-07-13 MED ORDER — INSULIN ASPART 100 UNIT/ML ~~LOC~~ SOLN: 0.0000 [IU] | Freq: Every day | SUBCUTANEOUS | Status: DC

## 2015-07-13 MED ORDER — GLYCOPYRROLATE 1 MG PO TABS
1.0000 mg | ORAL_TABLET | ORAL | Status: DC | PRN
  Filled 2015-07-13: qty 1

## 2015-07-13 MED ORDER — GLYCOPYRROLATE 0.2 MG/ML IJ SOLN: 0.2000 mg | INTRAMUSCULAR | Status: DC | PRN

## 2015-07-13 MED ORDER — BIOTENE DRY MOUTH MT LIQD: 15.0000 mL | OROMUCOSAL | Status: DC | PRN

## 2015-07-13 MED ORDER — HALOPERIDOL LACTATE 2 MG/ML PO CONC
0.5000 mg | ORAL | Status: DC | PRN
  Filled 2015-07-13: qty 0.3

## 2015-07-13 MED ORDER — HALOPERIDOL LACTATE 5 MG/ML IJ SOLN: 0.5000 mg | INTRAMUSCULAR | Status: DC | PRN

## 2015-07-13 MED ORDER — MORPHINE SULFATE 25 MG/ML IV SOLN
1.0000 mg/h | INTRAVENOUS | Status: DC | PRN
  Administered 2015-07-13: 2 mg/h via INTRAVENOUS
  Filled 2015-07-13: qty 10

## 2015-07-13 MED ORDER — LORAZEPAM 2 MG/ML IJ SOLN: 0.2500 mg | Freq: Four times a day (QID) | INTRAMUSCULAR | Status: DC | PRN

## 2015-07-13 MED ORDER — HYDROCORTISONE 2.5 % RE CREA
TOPICAL_CREAM | Freq: Three times a day (TID) | RECTAL | Status: DC | PRN
  Administered 2015-07-13: 17:00:00 via RECTAL
  Filled 2015-07-13: qty 28.35

## 2015-07-13 NOTE — H&P (Signed)
[facility] History and Physical  Patricia [name]:[id number] DOB: 09/27/1926 DOA: 06/29/2015  Referring physician: Hyacinth Meeker PCP: Londell Moh, MD   Chief Complaint: nausea/irregular rythm  HPI: Patricia [name] is a very pleasant 82 y.o. female with a past medical history that includes CAD, hypertension, COPD, A. fib, hypothyroid, right-sided heart failure, presents emergency department from a nursing facility with if complaint seizure-like activity and tachycardia. Initially evaluation in the emergency department reveals seizure-like activity as well as tachybradycardia arrhythmia.  Information is obtained from the patient and the daughters who are at the bedside. She's had gradual worsening dry nonproductive cough over the last 3 days. Her husband has been ill with an upper respiratory infection and is being hospitalized as well. EMS was called to the residence for him when they noticed that she demonstrated seizure-like activity lasted 30 seconds. They witnessed several episodes. Placed her on the monitor and found her to be in a wide complex regular tachycardia. Resolved spontaneously. Associated symptoms include nausea with intermittent vomiting and decreased oral intake as well as shortness of breath. Patient denies chest pain palpitation headache dizziness syncope or near-syncope. She does report frequency couple episodes but this is related to her chronic hypoxia. She denies abdominal pain diarrhea dysuria hematuria frequency or urgency.  In the emergency department to 100.2 hemodynamically stable with a blood pressure on the soft side heart rate range 29-47.    Review of Systems:  10 point review of systems complete and all systems are negative except as indicated in the history of present illness  Past Medical History  Diagnosis Date  . CAD (coronary artery disease)   . Mitral valve disorder     a. s/p MVR;  b. 06/2013 Echo: EF 55-60%, no rwma, Ao sclerosis, mild AI,  mild MR, miod dil LA, sev dil RV with reduced fxn, sev dil RA, mod TR/PR, PASP (study reviewed by Dr. Verlin Fester- MS felt to be sev, peak grad 39/mean 13).  [name] HTN (hypertension)   . Dyslipidemia   . Aortic stenosis, mild   . Breast cyst   . Hypertrophic cardiomyopathy (HCC)   . Sleep apnea   . COPD (chronic obstructive pulmonary disease) (HCC)     "a touch of"; prn o2 at home with activity   . Atrial fibrillation (HCC)     a. chronic coumadin.  [name] Hypothyroidism   . Anemia   . Chronic right-sided heart failure [facility])    Past Surgical History  Procedure Laterality Date  . Laparoscopic cholecystectomy  2009  . Cataract extraction  2003    bilateral  . Mitral valve replacement  2007  . Neck mass excision  2003    benign  . Colonoscopy    . Broken wrist      left, metal implanted   . Broken ankle      right, metal implanted   . Cardiac catheterization  06/2011  . Breast surgery      bilateral cysts x3 removed, benign   . Tee without cardioversion  01/31/2012    Procedure: TRANSESOPHAGEAL ECHOCARDIOGRAM (TEE);  Surgeon: Pricilla Riffle, MD;  Location: [facility] ENDOSCOPY;  Service: Cardiovascular;  Laterality: N/A;  . Cardioversion N/A 01/16/2013    Procedure: CARDIOVERSION;  Surgeon: Vesta Mixer, MD;  Location: [facility] ENDOSCOPY;  Service: Cardiovascular;  Laterality: N/A;   Social History:  reports that she quit smoking about 25 years ago. Her smoking use included Cigarettes. She has a 40 pack-year smoking history. She has never used  smokeless tobacco. She reports that she does not drink alcohol or use illicit drugs.  Allergies  Allergen Reactions  . Ivp Dye [Iodinated Diagnostic Agents] Itching  . Nitrofurantoin Itching  . Ramipril Other (See Comments)    unknown    Family History  Problem Relation Age of Onset  . Diabetes Mother   . CVA Mother   . Other Father   . Diabetes Brother   . Cancer Brother     throat ca, smoker  . Aneurysm Brother     brain aneurysm  . Breast cancer  Sister   . Emphysema Sister     smoker     Prior to Admission medications   Medication Sig Start Date End Date Taking? Authorizing Provider  acetaminophen (TYLENOL) 325 MG tablet Take 2 tablets (650 mg total) by mouth every 6 (six) hours as needed for mild pain (or Fever >/= 101). 11/08/14  Yes Shanker Levora Dredge, MD  albuterol (PROVENTIL HFA;VENTOLIN HFA) 108 (90 Base) MCG/ACT inhaler Inhale 2 puffs into the lungs every 6 (six) hours as needed for wheezing or shortness of breath.   Yes Historical Provider, MD  amiodarone (PACERONE) 100 MG tablet Take 100 mg by mouth daily.   Yes Historical Provider, MD  amoxicillin (AMOXIL) 500 MG tablet Take 2,000 mg by mouth See admin instructions. Pt takes 2000mg  (4 tabs) by mouth 1 hour prior to dental appts   Yes Historical Provider, MD  cholecalciferol (VITAMIN D) 1000 UNITS tablet Take 1,000 Units by mouth at bedtime.    Yes Historical Provider, MD  furosemide (LASIX) 20 MG tablet Take 3 tablets (60 mg total) by mouth daily. In the am 04/21/15  Yes Pricilla Riffle, MD  levothyroxine (SYNTHROID, LEVOTHROID) 125 MCG tablet Take 62.5 mcg by mouth daily before breakfast.   Yes Historical Provider, MD  LORazepam (ATIVAN) 0.5 MG tablet Take 2 tablets (1 mg total) by mouth every 2 (two) hours as needed for anxiety or seizure. Patient taking differently: Take 0.25 mg by mouth every 4 (four) hours as needed for anxiety or seizure.  11/08/14  Yes Shanker Levora Dredge, MD  nitroGLYCERIN (NITROSTAT) 0.4 MG SL tablet Place 0.4 mg under the tongue every 5 (five) minutes as needed for chest pain.   Yes Historical Provider, MD  ondansetron (ZOFRAN) 4 MG tablet Take 4 mg by mouth every 8 (eight) hours as needed for nausea or vomiting.   Yes Historical Provider, MD  OXYGEN Inhale 2 L into the lungs continuous.   Yes Historical Provider, MD  polycarbophil (FIBERCON) 625 MG tablet Take 1,250 mg by mouth at bedtime.    Yes Historical Provider, MD  sertraline (ZOLOFT) 100 MG tablet Take  100 mg by mouth daily.   Yes Historical Provider, MD  traMADol (ULTRAM) 50 MG tablet Take 50 mg by mouth 4 (four) times daily as needed for moderate pain.   Yes Historical Provider, MD  metoprolol tartrate (LOPRESSOR) 25 MG tablet Take 1 tablet (25 mg total) by mouth 2 (two) times daily. Patient not taking: Reported on 07/26/2015 09/21/13   Pricilla Riffle, MD  potassium chloride SA (K-DUR,KLOR-CON) 20 MEQ tablet Take 1 tablet (20 mEq total) by mouth 2 (two) times daily. Patient not taking: Reported on 07/26/2015 01/26/15   Pricilla Riffle, MD   Physical Exam: Filed Vitals:   26-Jul-2015 1000 Jul 26, 2015 1015 2015-07-26 1045 07/26/15 1100  BP: 110/50 100/46 100/76 90/72  Pulse: 34 29 57 41  Temp:      TempSrc:  Resp: Height:      Weight:      SpO2: 95% 95% 95% 95%    Wt Readings from Last 3 Encounters:  07/07/2015 69.854 kg (154 lb)  05/05/15 69.001 kg (152 lb 1.9 oz)  05/03/15 66.679 kg (147 lb)    General:  Appears calm and comfortable somewhat lethargic Eyes: PERRL, normal lids, irises & conjunctiva ENT: grossly normal hearing, lips & tongue his membranes of her mouth are pink but dry Neck: no LAD, masses or thyromegaly Cardiovascular: RRR, no m/r/g. No LE edema.  Respiratory: Increased work of breathing with conversation. Breath sounds diminished spine crackles bilateral bases Abdomen: soft, ntnd positive bowel sounds Skin: no rash or induration seen on limited exam Musculoskeletal: grossly normal tone BUE/BLE, joints without swelling/erythema Psychiatric: grossly normal mood and affect, speech fluent and appropriate Neurologic: grossly non-focal. Follows commands bilateral grip 5 out of 5           Labs on Admission:  Basic Metabolic Panel:  Recent Labs Lab 07/03/2015 0909 07/05/2015 0920  NA 141 140  K 4.6 4.8  CL 101 102  CO2  --  26  GLUCOSE 146* 155*  BUN 30* 28*  CREATININE 1.50* 1.53*  CALCIUM  --  9.5  MG  --  2.1   Liver Function Tests:  Recent  Labs Lab 06/27/2015 0920  AST 22  ALT 11*  ALKPHOS 59  BILITOT 0.9  PROT 7.0  ALBUMIN 3.5   No results for input(s): LIPASE, AMYLASE in the last 168 hours. No results for input(s): AMMONIA in the last 168 hours. CBC:  Recent Labs Lab 06/30/2015 0909 06/23/2015 0920  WBC  --  7.6  NEUTROABS  --  7.0  HGB 14.6 13.1  HCT 43.0 41.5  MCV  --  96.7  PLT  --  68*   Cardiac Enzymes: No results for input(s): CKTOTAL, CKMB, CKMBINDEX, TROPONINI in the last 168 hours.  BNP (last 3 results)  Recent Labs  11/05/14 1415  BNP 1327.6*    ProBNP (last 3 results) No results for input(s): PROBNP in the last 8760 hours.  CBG: No results for input(s): GLUCAP in the last 168 hours.  Radiological Exams on Admission: Dg Chest Port 1 View  07/16/2015  CLINICAL DATA:  Cough.  Bradycardia.  Weakness. EXAM: PORTABLE CHEST 1 VIEW COMPARISON:  02/21/2015 FINDINGS: Previous median sternotomy and aortic valve replacement. Pacer/defibrillator overlies the chest. The heart is enlarged. There is atherosclerotic disease of the aorta. There is pulmonary venous hypertension. Chronic interstitial prominence could be due to chronic lung disease or mild interstitial edema. No alveolar edema, infiltrate, collapse or effusion. Chronic prominence of the pulmonary arteries. IMPRESSION: Chronic cardiomegaly, aortic valve replacement and atherosclerosis of the aorta. Chronic interstitial lung markings. Chronic prominence of the pulmonary arteries. Electronically Signed   By: Paulina Fusi M.D.   On: 07/01/2015 09:27    EKG: Independently reviewed.Atrial fibrillation Ventricular bigeminy RBBB and LAFB LVH with secondary repolarization abnormality  Assessment/Plan Principal Problem:   Seizure (HCC) Active Problems:   Hyperlipidemia   Hypertensive heart disease   Chronic atrial fibrillation (HCC)   History of mitral valve replacement with bioprosthetic valve   COPD, mild (HCC)   Chronic diastolic heart failure  (HCC)   Chronic respiratory failure (HCC)   Non-sustained ventricular tachycardia (HCC)   Bradycardia   NSVT (nonsustained ventricular tachycardia) (HCC)   Hyperglycemia   Acute kidney injury (HCC)  1. Seizure-like activity. Multiple episodes en  route. One episode during my exam. Lasted approximately 15 seconds. Etiology unclear. No metabolic arrangements. No obvious signs of infectious process. -Admit to telemetry -Stat EEG -IV Ativan -Consider neurlogy  2. bradyardia/nonsustained V. tach. Patient with history of CAD, mitral valve disorder, hypertension, dyslipidemia, aortic stenosis, A. Fib. He reports heart rate very difficult to control between beta blocker and amiodarone. Since adjusted several times. patient with wide fluctuations. Family requests DO NOT RESUSCITATE with no any resuscitation should she go into V. tach or V. Fib -Will hold home amiodarone and beta blocker -Obtain a TSH -Request cardiology consult  #3. Acute kidney injury. Creatinine 1.5 on admission. Chart review indicates this is close to baseline -Gentle IV fluids -Hold nephrotoxins -Monitor urine output  #4. Chronic diastolic HF. She has been on beta blocker and amiodarone in the past. Appears compensated. Echo done in October 2016 yields an EF of 60% -Monitor daily weights -Monitor intake and output -Hold Lasix  #5. A. fib. At last score 6. Not on anticoagulation she is not a candidate. Surgery of syncopal spells.  -See #2.  #6. Chronic respiratory failure. Likely related to CHF and COPD. Stable at baseline -Oxygen saturation level greater than 90% on home oxygen dose -Hold Lasix for now -Lyses every 6 hours as needed -Home inhalers  cardiology  Code Status: DNR DVT Prophylaxis: Family Communication: daughter at bedside Disposition Plan: home  Time spent: 60 minutes  [facility] M [facility]

## 2015-07-13 NOTE — Progress Notes (Deleted)
Attempted to have patient come for EEG, RN stated pt was going to the OR in less than 1 hour. EEG unable to be done at this time.

## 2015-07-13 NOTE — Progress Notes (Signed)
Morphine drip down to  as requested by family. Pt condition continues to deteriorate

## 2015-07-13 NOTE — Progress Notes (Signed)
Pt admitted from ED for palliative care this pm. Alert and able to voice needs. Family at bedside. Morphine drip running at /hr. Can titrate up to  as required. Pain well controlled at present. Foley inserted as ordered. Husband who is a also a pt on this unit at bedside.

## 2015-07-13 NOTE — Consult Note (Signed)
Consultation Note Date: 07/18/2015   Patient Name: Patricia [name]  DOB: [birth year]  MRN: [id number]  Age / Sex: 82 y.o., female  PCP: Merri Brunette, MD Referring Physician: No att. providers found  Reason for Consultation: Establishing goals of care  Life limiting illness: 82 year old lady enrolled in hospice because of hypertrophic obstructive cardiomyopathy, admitted for possible syncope/seizure, evidence of ventricular tachycardia in the emergency department.  Clinical Assessment/Narrative: Patricia [name] is a 82 y.o. female with a history of HOCM, chronic atrial fib on amio, mitral stenosis (s/p MVR in 2007), mild CAD  Pulmnary HTN, Type B aortic dissection, COPD on 5L 02 who presented to [facility] on 07/09/2015 for nausea and "irregular heart beat." She was found to have bradycardia and long runs of sustained ventricular tachycardia. Of note, patient has been enrolled in community hospice for at least the last year according to the patient's granddaughter who is an Charity fundraiser who is present at the bedside.   The patient has a long-standing history of syncope and collapse. She lives in a nursing facility [facility] for her husband is also a resident. She has had gradual progressive decline. Cardiology has seen and evaluated the patient in the emergency department. Scope of care as outlined to focus exclusively and singularly on comfort.   Palliative care consult her for additional discussions and for assistance with end-of-life care.  Patient is an elderly appearing lady she is resting in bed. She is hard old towards 1 side. She appears to be in at least mild-moderate distress. She has wincing grimacing-few nonverbal gestures of distress/discomfort. Her family is present at the bedside. The patient was noted to have been in ventricular tachycardia in the emergency department. She received Ativan IV. She opens her eyes when her name is  called. Otherwise, is not able to provide any aspect of her history taking.  Goals are to continue comfort care only. Patient's daughter Rosey Bath is currently out of town but is on her way back. Patient's granddaughter, grandson in law Dr. Corliss Blacker, patient's other daughter and several other family members including the patient's husband are present at the bedside.  Plan is to continue as needed medications for comfort. Possibly seek residential hospice placement towards the end of this hospitalization for continued comfort measures and for symptom management of sequela of cardiac disease as well as sequela of recurrent syncopal episodes doing well from a cardiac standpoint. Apparently she was having some bradycardia and her Granddaughter texted with Dr. Tenny Craw and decided to stop her metoprolol.  Contacts/Participants in Discussion: Primary Decision Maker: Patient's daughter Galen Daft, granddaughter who is an Charity fundraiser, grandson in Social worker    Relationship to Patient   HCPOA: yes   SUMMARY OF RECOMMENDATIONS: DO NOT RESUSCITATE/DO NOT INTUBATE Patient already connected with community hospice in the outpatient setting Continue full scope of comfort measures-patient already on appropriate medications as far as when necessary symptom management medications Discussed with family about possibility of seeking residential hospice placement towards the end of this hospitalization. They're in agreement. Follow hospital course for the next 24 hours and then decide.   Code Status/Advance Care Planning: DNR    Code Status Orders        Start     Ordered   06/26/2015 1446  Do not attempt resuscitation (DNR)   Continuous    Question Answer Comment  In the event of cardiac or respiratory ARREST Do not call a "code blue"   In the event of cardiac or respiratory ARREST Do not  perform Intubation, CPR, defibrillation or ACLS   In the event of cardiac or respiratory ARREST Use medication by any route, position, wound  care, and other measures to relive pain and suffering. May use oxygen, suction and manual treatment of airway obstruction as needed for comfort.      08/07/15 1450    Code Status History    Date Active Date Inactive Code Status Order ID Comments User Context   07-Aug-2015  1:03 PM 07-Aug-2015  2:50 PM DNR [id number]  Gwenyth Bender, NP ED   02/21/2015 12:54 AM 02/22/2015  3:46 PM DNR [id number]  Therisa Doyne, MD ED   11/05/2014  8:35 PM 11/08/2014  2:29 PM DNR [id number]  Maretta Bees, MD Inpatient   02/13/2014  7:30 PM 02/15/2014  4:42 PM DNR [id number]  Zannie Cove, MD Inpatient   08/24/2013  9:16 AM 08/25/2013  7:41 PM DNR [id number]  Storm Frisk, MD Inpatient   08/21/2013  3:51 AM 08/24/2013  9:16 AM Partial Code [id number]  Duayne Cal, NP ED   07/12/2013 12:59 PM 07/14/2013  8:26 PM Full Code [id number]  Darrol Jump, PA-C Inpatient   03/24/2013  2:36 PM 03/27/2013  5:31 PM DNR [id number]  Renae Fickle, MD Inpatient    Advance Directive Documentation        Most Recent Value   Type of Advance Directive  -- [DNR]   Pre-existing out of facility DNR order (yellow form or pink MOST form)     "MOST" Form in Place?        Other Directives:None  Symptom Management:     Palliative Prophylaxis:   Delirium Protocol  Additional Recommendations (Limitations, Scope, Preferences):  Full Comfort Care    Psycho-social/Spiritual:  Support System: Strong Desire for further Chaplaincy support:no Additional Recommendations: Education on Hospice  Prognosis: < 2 weeks  Discharge Planning: Hospice facility ?   Chief Complaint/ Primary Diagnoses: Present on Admission:  . Hyperlipidemia . Hypertensive heart disease . COPD, mild (HCC) . Chronic respiratory failure (HCC) . Bradycardia . Hyperglycemia . Acute kidney injury (HCC) . Chronic atrial fibrillation (HCC) . Chronic diastolic heart failure (HCC)  I have reviewed the medical record, interviewed the patient and family, and  examined the patient. The following aspects are pertinent.  Past Medical History  Diagnosis Date  . CAD (coronary artery disease)   . Mitral valve disorder     a. s/p MVR;  b. 06/2013 Echo: EF 55-60%, no rwma, Ao sclerosis, mild AI, mild MR, miod dil LA, sev dil RV with reduced fxn, sev dil RA, mod TR/PR, PASP (study reviewed by Dr. Verlin Fester- MS felt to be sev, peak grad 39/mean 13).  [name] HTN (hypertension)   . Dyslipidemia   . Aortic stenosis, mild   . Breast cyst   . Hypertrophic cardiomyopathy (HCC)   . Sleep apnea   . COPD (chronic obstructive pulmonary disease) (HCC)     "a touch of"; prn o2 at home with activity   . Atrial fibrillation (HCC)     a. chronic coumadin.  [name] Hypothyroidism   . Anemia   . Chronic right-sided heart failure [facility])    Social History   Social History  . Marital Status: Married    Spouse Name: N/A  . Number of Children: N/A  . Years of Education: N/A   Occupational History  . Retired    Social History Main Topics  . Smoking status: Former Smoker -- 1.00 packs/day for 40 years  Types: Cigarettes    Quit date: 05/21/1990  . Smokeless tobacco: Never Used  . Alcohol Use: No  . Drug Use: No  . Sexual Activity: No   Other Topics Concern  . None   Social History Narrative   Lives with her husband.  Drives and walks around without assist device.           Family History  Problem Relation Age of Onset  . Diabetes Mother   . CVA Mother   . Other Father   . Diabetes Brother   . Cancer Brother     throat ca, smoker  . Aneurysm Brother     brain aneurysm  . Breast cancer Sister   . Emphysema Sister     smoker   Scheduled Meds: . albuterol  2.5 mg Nebulization Q6H  . insulin aspart  0-5 Units Subcutaneous QHS  . insulin aspart  0-9 Units Subcutaneous TID WC  . sertraline  100 mg Oral Daily  . sodium chloride flush  3 mL Intravenous Q12H   Continuous Infusions: . sodium chloride    . morphine     PRN Meds:.acetaminophen **OR**  acetaminophen, alum & mag hydroxide-simeth, antiseptic oral rinse, bisacodyl, diphenhydrAMINE, glycopyrrolate **OR** glycopyrrolate **OR** glycopyrrolate, haloperidol **OR** haloperidol **OR** haloperidol lactate, HYDROcodone-acetaminophen, hydrocortisone, LORazepam, morphine, morphine, ondansetron **OR** ondansetron (ZOFRAN) IV, ondansetron **OR** ondansetron (ZOFRAN) IV, polyethylene glycol, polyvinyl alcohol, temazepam, traMADol, witch hazel-glycerin Medications Prior to Admission:  Prior to Admission medications   Medication Sig Start Date End Date Taking? Authorizing Provider  acetaminophen (TYLENOL) 325 MG tablet Take 2 tablets (650 mg total) by mouth every 6 (six) hours as needed for mild pain (or Fever >/= 101). 11/08/14  Yes Shanker Levora Dredge, MD  albuterol (PROVENTIL HFA;VENTOLIN HFA) 108 (90 Base) MCG/ACT inhaler Inhale 2 puffs into the lungs every 6 (six) hours as needed for wheezing or shortness of breath.   Yes Historical Provider, MD  amiodarone (PACERONE) 100 MG tablet Take 100 mg by mouth daily.   Yes Historical Provider, MD  amoxicillin (AMOXIL) 500 MG tablet Take 2,000 mg by mouth See admin instructions. Pt takes 2000mg  (4 tabs) by mouth 1 hour prior to dental appts   Yes Historical Provider, MD  cholecalciferol (VITAMIN D) 1000 UNITS tablet Take 1,000 Units by mouth at bedtime.    Yes Historical Provider, MD  furosemide (LASIX) 20 MG tablet Take 3 tablets (60 mg total) by mouth daily. In the am 04/21/15  Yes Pricilla Riffle, MD  levothyroxine (SYNTHROID, LEVOTHROID) 125 MCG tablet Take 62.5 mcg by mouth daily before breakfast.   Yes Historical Provider, MD  LORazepam (ATIVAN) 0.5 MG tablet Take 2 tablets (1 mg total) by mouth every 2 (two) hours as needed for anxiety or seizure. Patient taking differently: Take 0.25 mg by mouth every 4 (four) hours as needed for anxiety or seizure.  11/08/14  Yes Shanker Levora Dredge, MD  nitroGLYCERIN (NITROSTAT) 0.4 MG SL tablet Place 0.4 mg under the  tongue every 5 (five) minutes as needed for chest pain.   Yes Historical Provider, MD  ondansetron (ZOFRAN) 4 MG tablet Take 4 mg by mouth every 8 (eight) hours as needed for nausea or vomiting.   Yes Historical Provider, MD  OXYGEN Inhale 2 L into the lungs continuous.   Yes Historical Provider, MD  polycarbophil (FIBERCON) 625 MG tablet Take 1,250 mg by mouth at bedtime.    Yes Historical Provider, MD  sertraline (ZOLOFT) 100 MG tablet Take 100 mg by mouth  daily.   Yes Historical Provider, MD  traMADol (ULTRAM) 50 MG tablet Take 50 mg by mouth 4 (four) times daily as needed for moderate pain.   Yes Historical Provider, MD  metoprolol tartrate (LOPRESSOR) 25 MG tablet Take 1 tablet (25 mg total) by mouth 2 (two) times daily. Patient not taking: Reported on 2015/08/12 09/21/13   Pricilla Riffle, MD  potassium chloride SA (K-DUR,KLOR-CON) 20 MEQ tablet Take 1 tablet (20 mEq total) by mouth 2 (two) times daily. Patient not taking: Reported on 08-12-15 01/26/15   Pricilla Riffle, MD   Allergies  Allergen Reactions  . Ivp Dye [Iodinated Diagnostic Agents] Itching  . Nitrofurantoin Itching  . Ramipril Other (See Comments)    unknown    Review of Systems Elderly lady does not verbalize Physical Exam Elderly lady appears weak appears in at least mild-moderate distress Shallow breathing lung sounds clear to auscultation anteriorly S1-S2 regular Abdomen soft mild distention Mild generalized edema Patient opens eyes when her name is called otherwise does not verbalize does not follow commands  Vital Signs: BP 108/82 mmHg  Pulse 36  Temp(Src) 97 F (36.1 C) (Axillary)  Resp 20  Ht  (1.626 m)  Wt 69.854 kg (154 lb)  BMI 26.42 kg/m2  SpO2 95%  SpO2: SpO2: 95 % O2 Device:SpO2: 95 % O2 Flow Rate: .O2 Flow Rate (L/min): 6 L/min  IO: Intake/output summary: No intake or output data in the 24 hours ending Aug 12, 2015 1604  LBM:   Baseline Weight: Weight: 69.854 kg (154 lb) Most recent weight:  Weight: 69.854 kg (154 lb)      Palliative Assessment/Data:  Flowsheet Rows        Most Recent Value   Intake Tab    Referral Department  Hospitalist   Unit at Time of Referral  Med/Surg Unit   Palliative Care Primary Diagnosis  Cardiac   Palliative Care Type  New Palliative care   Reason for referral  Clarify Goals of Care, End of Life Care Assistance   Date first seen by Palliative Care  08-12-15   Clinical Assessment    Palliative Performance Scale Score  20%   Pain Max last 24 hours  6   Pain Min Last 24 hours  5   Dyspnea Max Last 24 Hours  5   Dyspnea Min Last 24 hours  4   Psychosocial & Spiritual Assessment    Palliative Care Outcomes    Patient/Family meeting held?  Yes   Who was at the meeting?  patient's grand daughter, daughter.    Palliative Care Outcomes  Clarified goals of care   Palliative Care follow-up planned  Yes, Facility      Additional Data Reviewed:  CBC:    Component Value Date/Time   WBC 7.6 08/12/15 0920   HGB 13.1 08/12/2015 0920   HCT 41.5 08/12/15 0920   PLT 68* August 12, 2015 0920   MCV 96.7 August 12, 2015 0920   NEUTROABS 7.0 12-Aug-2015 0920   LYMPHSABS 0.2* 2015/08/12 0920   MONOABS 0.3 08/12/2015 0920   EOSABS 0.0 12-Aug-2015 0920   BASOSABS 0.0 12-Aug-2015 0920   Comprehensive Metabolic Panel:    Component Value Date/Time   NA 140 August 12, 2015 0920   NA 137 10/30/2013 1705   K 4.8 2015/08/12 0920   K 4.0 10/30/2013 1705   CL 102 08-12-15 0920   CL 101 10/30/2013 1705   CO2 26 2015/08/12 0920   CO2 32 10/30/2013 1705   BUN 28* 12-Aug-2015 0920  BUN 15 10/30/2013 1705   CREATININE 1.53* 06/29/2015 0920   CREATININE 1.58* 05/05/2015 1206   CREATININE 0.77 10/30/2013 1705   GLUCOSE 155* 07/03/2015 0920   GLUCOSE 87 10/30/2013 1705   GLUCOSE 90 04/22/2006 0839   CALCIUM 9.5 06/27/2015 0920   CALCIUM 8.4* 10/30/2013 1705   AST 22 06/23/2015 0920   ALT 11* 07/15/2015 0920   ALKPHOS 59 06/29/2015 0920   BILITOT 0.9 07/01/2015  0920   PROT 7.0 07/12/2015 0920   ALBUMIN 3.5 06/27/2015 0920     Time In: 3 Time Out: 4 Time Total: 60 min  Greater than 50%  of this time was spent counseling and coordinating care related to the above assessment and plan.  Signed by: Rosalin Hawking, MD [phone]  Rosalin Hawking, MD  07/15/2015, 4:04 PM  Please contact Palliative Medicine Team phone at [phone] for questions and concerns.

## 2015-07-13 NOTE — Progress Notes (Signed)
Pt having agonal breathing. Family at bedside

## 2015-07-13 NOTE — Consult Note (Signed)
CARDIOLOGY CONSULT NOTE   Patient ID: Patricia [name] MRN: [id number] DOB/AGE: Aug 05, 1926 82 y.o.  Admit date: 2015-08-07  Consulting Physician: Primary Physician   Londell Moh, MD Primary Cardiologist   Dr. Tenny Craw Reason for Consultation  Sustained ventricular tachycardia  HPI: Patricia [name] is a 82 y.o. female with a history of HOCM, chronic atrial fib on amio, mitral stenosis (s/p MVR in 2007), mild CAD Pulmnary HTN, Type B aortic dissection, COPD on 5L 02 who presented to [facility] on Aug 07, 2015 for nausea and "irregular heart beat." She was found to have bradycardia and long runs of sustained ventricular tachycardia. She is a DNR and cardiology is asked to consult for help in management.   She has a long history of recurrent syncope and falls. She did wear a Holter in 08/2013 that was unremarkable showing only AF with CVR.  Admitted in 02/2015 for syncopal episode. She said she was walking to the dinning room and "blacked out". She had no warning, woke up on the floor after her husband came. Echo done in Feb 2015 showed preserved LVF with PA pressure of 60. Echo done 02/21/15:  PA 37 mmHg, EF 60%, no significant MS, moderate AR. Dr. Eden Emms saw her during this admission who felt this was Syncope in context of low oxygen stats (83%) pulmonary hypertension and dyspnea. He did not feel it was a primary cardiac event.   Seen in the ER on 05/03/15 for loss of consciousness. She was sent home in good condition with no further work up.  Last seen by Dr. Tenny Craw on 05/05/2015 and felt to be doing well from a cardiac standpoint. Apparently she was having some bradycardia and her Granddaughter texted with Dr. Tenny Craw and decided to stop her metoprolol.  Today she was at her nursing facility with her husband who has been sick. She also had been having some URI symptoms and not feeling well. Her husband had a fall and EMS was called. Her granddaughter thinks that due to the stress of it all she started  to feel nauseated and vomited. Then she started having episodes of presyncope similar to previous and she was brought to the emergency room as well. During her admission in the ER she was noted to be bradycardic with long runs of ventricular tachycardia and cardiology was consulted.    Past Medical History  Diagnosis Date  . CAD (coronary artery disease)   . Mitral valve disorder     a. s/p MVR;  b. 06/2013 Echo: EF 55-60%, no rwma, Ao sclerosis, mild AI, mild MR, miod dil LA, sev dil RV with reduced fxn, sev dil RA, mod TR/PR, PASP (study reviewed by Dr. Verlin Fester- MS felt to be sev, peak grad 39/mean 13).  [name] HTN (hypertension)   . Dyslipidemia   . Aortic stenosis, mild   . Breast cyst   . Hypertrophic cardiomyopathy (HCC)   . Sleep apnea   . COPD (chronic obstructive pulmonary disease) (HCC)     "a touch of"; prn o2 at home with activity   . Atrial fibrillation (HCC)     a. chronic coumadin.  [name] Hypothyroidism   . Anemia   . Chronic right-sided heart failure [facility])      Past Surgical History  Procedure Laterality Date  . Laparoscopic cholecystectomy  2009  . Cataract extraction  2003    bilateral  . Mitral valve replacement  2007  . Neck mass excision  2003    benign  .  Colonoscopy    . Broken wrist      left, metal implanted   . Broken ankle      right, metal implanted   . Cardiac catheterization  06/2011  . Breast surgery      bilateral cysts x3 removed, benign   . Tee without cardioversion  01/31/2012    Procedure: TRANSESOPHAGEAL ECHOCARDIOGRAM (TEE);  Surgeon: Pricilla Riffle, MD;  Location: [facility] ENDOSCOPY;  Service: Cardiovascular;  Laterality: N/A;  . Cardioversion N/A 01/16/2013    Procedure: CARDIOVERSION;  Surgeon: Vesta Mixer, MD;  Location: [facility] ENDOSCOPY;  Service: Cardiovascular;  Laterality: N/A;    Allergies  Allergen Reactions  . Ivp Dye [Iodinated Diagnostic Agents] Itching  . Nitrofurantoin Itching  . Ramipril Other (See Comments)    unknown    I have  reviewed the patient's current medications . LORazepam  0.5 mg Intravenous Once       Prior to Admission medications   Medication Sig Start Date End Date Taking? Authorizing Provider  acetaminophen (TYLENOL) 325 MG tablet Take 2 tablets (650 mg total) by mouth every 6 (six) hours as needed for mild pain (or Fever >/= 101). 11/08/14  Yes Shanker Levora Dredge, MD  albuterol (PROVENTIL HFA;VENTOLIN HFA) 108 (90 Base) MCG/ACT inhaler Inhale 2 puffs into the lungs every 6 (six) hours as needed for wheezing or shortness of breath.   Yes Historical Provider, MD  amiodarone (PACERONE) 100 MG tablet Take 100 mg by mouth daily.   Yes Historical Provider, MD  amoxicillin (AMOXIL) 500 MG tablet Take 2,000 mg by mouth See admin instructions. Pt takes 2000mg  (4 tabs) by mouth 1 hour prior to dental appts   Yes Historical Provider, MD  cholecalciferol (VITAMIN D) 1000 UNITS tablet Take 1,000 Units by mouth at bedtime.    Yes Historical Provider, MD  furosemide (LASIX) 20 MG tablet Take 3 tablets (60 mg total) by mouth daily. In the am 04/21/15  Yes Pricilla Riffle, MD  levothyroxine (SYNTHROID, LEVOTHROID) 125 MCG tablet Take 62.5 mcg by mouth daily before breakfast.   Yes Historical Provider, MD  LORazepam (ATIVAN) 0.5 MG tablet Take 2 tablets (1 mg total) by mouth every 2 (two) hours as needed for anxiety or seizure. Patient taking differently: Take 0.25 mg by mouth every 4 (four) hours as needed for anxiety or seizure.  11/08/14  Yes Shanker Levora Dredge, MD  nitroGLYCERIN (NITROSTAT) 0.4 MG SL tablet Place 0.4 mg under the tongue every 5 (five) minutes as needed for chest pain.   Yes Historical Provider, MD  ondansetron (ZOFRAN) 4 MG tablet Take 4 mg by mouth every 8 (eight) hours as needed for nausea or vomiting.   Yes Historical Provider, MD  OXYGEN Inhale 2 L into the lungs continuous.   Yes Historical Provider, MD  polycarbophil (FIBERCON) 625 MG tablet Take 1,250 mg by mouth at bedtime.    Yes Historical Provider,  MD  sertraline (ZOLOFT) 100 MG tablet Take 100 mg by mouth daily.   Yes Historical Provider, MD  traMADol (ULTRAM) 50 MG tablet Take 50 mg by mouth 4 (four) times daily as needed for moderate pain.   Yes Historical Provider, MD  metoprolol tartrate (LOPRESSOR) 25 MG tablet Take 1 tablet (25 mg total) by mouth 2 (two) times daily. Patient not taking: Reported on 07-29-2015 09/21/13   Pricilla Riffle, MD  potassium chloride SA (K-DUR,KLOR-CON) 20 MEQ tablet Take 1 tablet (20 mEq total) by mouth 2 (two) times daily. Patient not taking:  Reported on 07/08/2015 01/26/15   Pricilla Riffle, MD     Social History   Social History  . Marital Status: Married    Spouse Name: N/A  . Number of Children: N/A  . Years of Education: N/A   Occupational History  . Retired    Social History Main Topics  . Smoking status: Former Smoker -- 1.00 packs/day for 40 years    Types: Cigarettes    Quit date: 05/21/1990  . Smokeless tobacco: Never Used  . Alcohol Use: No  . Drug Use: No  . Sexual Activity: No   Other Topics Concern  . Not on file   Social History Narrative   Lives with her husband.  Drives and walks around without assist device.            Family Status  Relation Status Death Age  . Mother  1  . Father      accidental death  . Brother  37  . Sister  63  . Sister  73   Family History  Problem Relation Age of Onset  . Diabetes Mother   . CVA Mother   . Other Father   . Diabetes Brother   . Cancer Brother     throat ca, smoker  . Aneurysm Brother     brain aneurysm  . Breast cancer Sister   . Emphysema Sister     smoker     ROS:  Full 14 point review of systems complete and found to be negative unless listed above.  Physical Exam: Blood pressure 90/72, pulse 41, temperature 100.2 F (37.9 C), temperature source Oral, resp. rate 18, height  (1.626 m), weight 154 lb (69.854 kg), SpO2 95 %.  General: Well developed, well nourished, female in no acute distress. Elderly and  frail Head: Eyes PERRLA, No xanthomas.   Normocephalic and atraumatic, oropharynx without edema or exudate.   Lungs: mild diffuse wheezes Heart: HRRR S1 S2, no rub/gallop, Heart bradycardic with S1, S2 + murmur. pulses are 2+ extrem.   Neck: No carotid bruits. No lymphadenopathy.  No JVD. Abdomen: Bowel sounds present, abdomen soft and non-tender without masses or hernias noted. Msk:  No spine or cva tenderness. No weakness, no joint deformities or effusions. Extremities: No clubbing or cyanosis. No LE  edema.  Neuro: Alert and oriented X 3. No focal deficits noted. Psych:  Good affect, responds appropriately Skin: No rashes or lesions noted.  Labs:   Lab Results  Component Value Date   WBC 7.6 07/12/2015   HGB 13.1 07/05/2015   HCT 41.5 07/09/2015   MCV 96.7 06/22/2015   PLT 68* 07/11/2015   No results for input(s): INR in the last 72 hours.  Recent Labs Lab 06/24/2015 0920  NA 140  K 4.8  CL 102  CO2 26  BUN 28*  CREATININE 1.53*  CALCIUM 9.5  PROT 7.0  BILITOT 0.9  ALKPHOS 59  ALT 11*  AST 22  GLUCOSE 155*  ALBUMIN 3.5   MAGNESIUM  Date Value Ref Range Status  07/16/2015 2.1 1.7 - 2.4 mg/dL Final   No results for input(s): CKTOTAL, CKMB, TROPONINI in the last 72 hours.  Recent Labs  07/06/2015 0907  TROPIPOC 0.05    Lab Results  Component Value Date   CHOL 156 07/13/2013   HDL 31* 07/13/2013   LDLCALC 109* 07/13/2013   TRIG 79 07/13/2013   No results found for: [facility] LIPASE  Date/Time Value Ref Range Status  11/05/2014  02:18 PM 12* 22 - 51 U/L Final   AMYLASE  Date/Time Value Ref Range Status  07/13/2013 07:05 AM 30 0 - 105 U/L Final   :    Radiology:  Dg Chest Port 1 View  07/08/2015  CLINICAL DATA:  Cough.  Bradycardia.  Weakness. EXAM: PORTABLE CHEST 1 VIEW COMPARISON:  02/21/2015 FINDINGS: Previous median sternotomy and aortic valve replacement. Pacer/defibrillator overlies the chest. The heart is enlarged. There is atherosclerotic disease  of the aorta. There is pulmonary venous hypertension. Chronic interstitial prominence could be due to chronic lung disease or mild interstitial edema. No alveolar edema, infiltrate, collapse or effusion. Chronic prominence of the pulmonary arteries. IMPRESSION: Chronic cardiomegaly, aortic valve replacement and atherosclerosis of the aorta. Chronic interstitial lung markings. Chronic prominence of the pulmonary arteries. Electronically Signed   By: Paulina Fusi M.D.   On: 07/15/2015 09:27    ASSESSMENT AND PLAN:    Principal Problem:   Seizure (HCC) Active Problems:   Hyperlipidemia   Hypertensive heart disease   Chronic atrial fibrillation (HCC)   History of mitral valve replacement with bioprosthetic valve   COPD, mild (HCC)   Chronic diastolic heart failure (HCC)   Chronic respiratory failure (HCC)   Non-sustained ventricular tachycardia (HCC)   Bradycardia   NSVT (nonsustained ventricular tachycardia) (HCC)   Hyperglycemia   Acute kidney injury (HCC)  Patricia [name] is a 82 y.o. female with a history of HOCM, chronic atrial fib on amio, mitral stenosis (s/p MVR in 2007), mild CAD Pulmnary HTN, Type B aortic dissection, COPD on 5L 02 who presented to [facility] on 07/09/2015 for nausea and "irregular heart beat." She was found to have bradycardia and long runs of sustained ventricular tachycardia. She is a DNR and cardiology is asked to consult for help in management.   Sustained ventricular tachycardia: The patient has a long history of syncope and collapse. Her constellation of symptoms with these previous episodes are similar to those today while she was having witnessed ventricular tachycardia.   Disposition: The patient is on hospice for HOCM, chronic atrial fibrillation as well as COPD and multiple medical comorbidities. The patient and her family were very clear that she does not want cardioversion. The plan is for comfort care. Initially, we felt like we could start amiodarone as a  palliative approach to stop the ventricular tachycardia. However, she then spontaneously converted to sinus bradycardia with heart rates in the high 20s. We then felt that amiodarone may worsen the bradycardia. She was given IV Ativan in the emergency department and is now quite sedated, the appears comfortable. Plan now is watchful waiting and comfort approach.    SignedAllena Katz 06/24/2015 12:56 PM  Pager [phone]  I have personally seen and examined this patient with Cline Crock, PA-C I agree with the assessment and plan as outlined above. Patricia [name] is admitted today for nausea and irregular heartbeats. She was found to have VT. She is a DNR on Hospice. Her granddaughter is a Engineer, civil (consulting) in our cardiology office and her grandson in law is one of our critical care doctors. We have spoken to the family at the bedside and it is clear that her wishes had been for no CPR, shocks. She fortunately converted to sinus brady during my examination. At this time, she has a HR of 28-32 bpm. We will not load IV amiodarone given the bradycardia. The family wishes for comfort measures only. She is sedated at this time. We will not plan  any cardiac interventions or med changes at this time. Will follow with you.   Patrina Andreas 07/03/2015 2:14 PM

## 2015-07-13 NOTE — ED Notes (Signed)
Daughters at bedside and states pt HR her norm and they do not want any measures to code.Dr. Hyacinth Meeker at bedside speaking with family.

## 2015-07-13 NOTE — Progress Notes (Signed)
   07/04/2015 1605  Clinical Encounter Type  Visited With Patient and family together;Health care provider  Visit Type Initial;Patient actively dying  Referral From Physician   Chaplain responded to an end of life consult for the patient. Chaplain introduced spiritual care services to the family and offered support. Family seems to be fine at this time, and indicated they will call their pastor as well. Chaplain support available as needed.   Alda Ponder, Chaplain 07/12/2015 4:07 PM

## 2015-07-13 NOTE — ED Provider Notes (Signed)
CSN: [id number]     Arrival date & time 07/01/2015  8295 History   First MD Initiated Contact with Patient 07/06/2015 [phone]     Chief Complaint  Patient presents with  . Nausea  . Irregular Heart Beat     (Consider location/radiation/quality/duration/timing/severity/associated sxs/prior Treatment) HPI Comments: The patient is a chronically ill 82 year old female, she presents to the hospital by ambulance after the paramedics were called to her residence at a nursing facility because of her husband who had an fall. Evidently they witnessed that the patient was having seizure-like activity, during the seizure-like activity spells he placed her on a cardiac monitor and found her to be in a wide-complex regular tachycardia consistent with ventricular tachycardia, this did appear to be polymorphic. She then spontaneously resolved within less than 30 seconds each time. They witnessed multiple events. The patient states that she has been vomiting, she had a GI illness last week, she has had some coughing overnight last night and did not sleep very much. Otherwise the patient is a very poor historian. According to the medical record which I have reviewed, the cardiologist have seen the patient in the past for multiple problems including mitral valve replacement, hypertrophic cardiomyopathy, atrial fibrillation, congestive heart failure. She does have fairly severe pulmonary hypertension and does have DO NOT RESUSCITATE paperwork.  The history is provided by the patient.    Past Medical History  Diagnosis Date  . CAD (coronary artery disease)   . Mitral valve disorder     a. s/p MVR;  b. 06/2013 Echo: EF 55-60%, no rwma, Ao sclerosis, mild AI, mild MR, miod dil LA, sev dil RV with reduced fxn, sev dil RA, mod TR/PR, PASP (study reviewed by Dr. Verlin Fester- MS felt to be sev, peak grad 39/mean 13).  [name] HTN (hypertension)   . Dyslipidemia   . Aortic stenosis, mild   . Breast cyst   . Hypertrophic cardiomyopathy  (HCC)   . Sleep apnea   . COPD (chronic obstructive pulmonary disease) (HCC)     "a touch of"; prn o2 at home with activity   . Atrial fibrillation (HCC)     a. chronic coumadin.  [name] Hypothyroidism   . Anemia   . Chronic right-sided heart failure [facility])    Past Surgical History  Procedure Laterality Date  . Laparoscopic cholecystectomy  2009  . Cataract extraction  2003    bilateral  . Mitral valve replacement  2007  . Neck mass excision  2003    benign  . Colonoscopy    . Broken wrist      left, metal implanted   . Broken ankle      right, metal implanted   . Cardiac catheterization  06/2011  . Breast surgery      bilateral cysts x3 removed, benign   . Tee without cardioversion  01/31/2012    Procedure: TRANSESOPHAGEAL ECHOCARDIOGRAM (TEE);  Surgeon: Pricilla Riffle, MD;  Location: [facility] ENDOSCOPY;  Service: Cardiovascular;  Laterality: N/A;  . Cardioversion N/A 01/16/2013    Procedure: CARDIOVERSION;  Surgeon: Vesta Mixer, MD;  Location: [facility] ENDOSCOPY;  Service: Cardiovascular;  Laterality: N/A;   Family History  Problem Relation Age of Onset  . Diabetes Mother   . CVA Mother   . Other Father   . Diabetes Brother   . Cancer Brother     throat ca, smoker  . Aneurysm Brother     brain aneurysm  . Breast cancer Sister   . Emphysema  Sister     smoker   Social History  Substance Use Topics  . Smoking status: Former Smoker -- 1.00 packs/day for 40 years    Types: Cigarettes    Quit date: 05/21/1990  . Smokeless tobacco: Never Used  . Alcohol Use: No   OB History    No data available     Review of Systems  All other systems reviewed and are negative.     Allergies  Ivp dye; Nitrofurantoin; and Ramipril  Home Medications   Prior to Admission medications   Medication Sig Start Date End Date Taking? Authorizing Provider  acetaminophen (TYLENOL) 325 MG tablet Take 2 tablets (650 mg total) by mouth every 6 (six) hours as needed for mild pain (or Fever >/= 101).  11/08/14  Yes Shanker Levora Dredge, MD  albuterol (PROVENTIL HFA;VENTOLIN HFA) 108 (90 Base) MCG/ACT inhaler Inhale 2 puffs into the lungs every 6 (six) hours as needed for wheezing or shortness of breath.   Yes Historical Provider, MD  amiodarone (PACERONE) 100 MG tablet Take 100 mg by mouth daily.   Yes Historical Provider, MD  amoxicillin (AMOXIL) 500 MG tablet Take 2,000 mg by mouth See admin instructions. Pt takes  (4 tabs) by mouth 1 hour prior to dental appts   Yes Historical Provider, MD  cholecalciferol (VITAMIN D) 1000 UNITS tablet Take 1,000 Units by mouth at bedtime.    Yes Historical Provider, MD  furosemide (LASIX) 20 MG tablet Take 3 tablets (60 mg total) by mouth daily. In the am 04/21/15  Yes Pricilla Riffle, MD  levothyroxine (SYNTHROID, LEVOTHROID) 125 MCG tablet Take 62.5 mcg by mouth daily before breakfast.   Yes Historical Provider, MD  LORazepam (ATIVAN) 0.5 MG tablet Take 2 tablets (1 mg total) by mouth every 2 (two) hours as needed for anxiety or seizure. Patient taking differently: Take 0.25 mg by mouth every 4 (four) hours as needed for anxiety or seizure.  11/08/14  Yes Shanker Levora Dredge, MD  nitroGLYCERIN (NITROSTAT) 0.4 MG SL tablet Place 0.4 mg under the tongue every 5 (five) minutes as needed for chest pain.   Yes Historical Provider, MD  ondansetron (ZOFRAN) 4 MG tablet Take 4 mg by mouth every 8 (eight) hours as needed for nausea or vomiting.   Yes Historical Provider, MD  OXYGEN Inhale 2 L into the lungs continuous.   Yes Historical Provider, MD  polycarbophil (FIBERCON) 625 MG tablet Take 1,250 mg by mouth at bedtime.    Yes Historical Provider, MD  sertraline (ZOLOFT) 100 MG tablet Take 100 mg by mouth daily.   Yes Historical Provider, MD  traMADol (ULTRAM) 50 MG tablet Take 50 mg by mouth 4 (four) times daily as needed for moderate pain.   Yes Historical Provider, MD  metoprolol tartrate (LOPRESSOR) 25 MG tablet Take 1 tablet (25 mg total) by mouth 2 (two) times  daily. Patient not taking: Reported on 08-11-2015 09/21/13   Pricilla Riffle, MD  potassium chloride SA (K-DUR,KLOR-CON) 20 MEQ tablet Take 1 tablet (20 mEq total) by mouth 2 (two) times daily. Patient not taking: Reported on 2015-08-11 01/26/15   Pricilla Riffle, MD   BP 90/72 mmHg  Pulse 41  Temp(Src) 100.2 F (37.9 C) (Oral)  Resp 18  Ht  (1.626 m)  Wt 154 lb (69.854 kg)  BMI 26.42 kg/m2  SpO2 95% Physical Exam  Constitutional: She appears well-developed and well-nourished. No distress.  HENT:  Head: Normocephalic and atraumatic.  Mouth/Throat: Oropharynx is  clear and moist. No oropharyngeal exudate.  Eyes: Conjunctivae and EOM are normal. Pupils are equal, round, and reactive to light. Right eye exhibits no discharge. Left eye exhibits no discharge. No scleral icterus.  Neck: Normal range of motion. Neck supple. No JVD present. No thyromegaly present.  Cardiovascular: Normal heart sounds and intact distal pulses.  Exam reveals no gallop and no friction rub.   No murmur heard. Bradycardic, frequent ectopy  Pulmonary/Chest: Effort normal. No respiratory distress. She has no wheezes. She has rales (scattered rales, no distress).  Abdominal: Soft. Bowel sounds are normal. She exhibits no distension and no mass. There is no tenderness.  Musculoskeletal: Normal range of motion. She exhibits no edema or tenderness.  Lymphadenopathy:    She has no cervical adenopathy.  Neurological: She is alert. Coordination normal.  Skin: Skin is warm and dry. No rash noted. No erythema.  Psychiatric: She has a normal mood and affect. Her behavior is normal.  Nursing note and vitals reviewed.   ED Course  Procedures (including critical care time) Labs Review Labs Reviewed  COMPREHENSIVE METABOLIC PANEL - Abnormal; Notable for the following:    Glucose, Bld 155 (*)    BUN 28 (*)    Creatinine, Ser 1.53 (*)    ALT 11 (*)    GFR calc non Af Amer 30 (*)    GFR calc Af Amer 34 (*)    All other  components within normal limits  CBC WITH DIFFERENTIAL/PLATELET - Abnormal; Notable for the following:    Platelets 68 (*)    Lymphs Abs 0.2 (*)    All other components within normal limits  I-STAT CHEM 8, ED - Abnormal; Notable for the following:    BUN 30 (*)    Creatinine, Ser 1.50 (*)    Glucose, Bld 146 (*)    Calcium, Ion 1.10 (*)    All other components within normal limits  URINE CULTURE  CULTURE, BLOOD (ROUTINE X 2)  CULTURE, BLOOD (ROUTINE X 2)  MAGNESIUM  URINALYSIS, ROUTINE W REFLEX MICROSCOPIC (NOT AT [facility])  I-STAT TROPOININ, ED  I-STAT CG4 LACTIC ACID, ED  I-STAT CG4 LACTIC ACID, ED    Imaging Review Dg Chest Port 1 View  07/12/2015  CLINICAL DATA:  Cough.  Bradycardia.  Weakness. EXAM: PORTABLE CHEST 1 VIEW COMPARISON:  02/21/2015 FINDINGS: Previous median sternotomy and aortic valve replacement. Pacer/defibrillator overlies the chest. The heart is enlarged. There is atherosclerotic disease of the aorta. There is pulmonary venous hypertension. Chronic interstitial prominence could be due to chronic lung disease or mild interstitial edema. No alveolar edema, infiltrate, collapse or effusion. Chronic prominence of the pulmonary arteries. IMPRESSION: Chronic cardiomegaly, aortic valve replacement and atherosclerosis of the aorta. Chronic interstitial lung markings. Chronic prominence of the pulmonary arteries. Electronically Signed   By: Paulina Fusi M.D.   On: 06/26/2015 09:27   I have personally reviewed and evaluated these images and lab results as part of my medical decision-making.   EKG Interpretation   Date/Time:  Wednesday July 13 2015 08:41:53 EST Ventricular Rate:  35 PR Interval:    QRS Duration: 166 QT Interval:  582 QTC Calculation: 444 R Axis:   -63 Text Interpretation:  Atrial fibrillation Ventricular bigeminy RBBB and  LAFB LVH with secondary repolarization abnormality Since last tracing rate  slower - more irregular, ectopy now seen Confirmed by  Yesmin Mutch  MD, Makenli Derstine  ([id number]) on 07/16/2015 9:04:24 AM      MDM   Final diagnoses:  NSVT (nonsustained  ventricular tachycardia) (HCC)    The patient does not appear to be in distress at this time however she does have a fever, she has low oxygen saturations and with multiple cardiac comorbidities this could be a number of causes. She has had a recent GI illness this electrolytes may be a problem, this may be infectious as she has been coughing all night, she certainly needs to be on a cardiac monitor, she needs to have labs, evaluation, magnesium as this could've been torsades, she also has a white bundle branch block at baseline and thus this could be a SVT with aberrancy though that seems less likely given the obvious V. tach in nature of the EKG appearance. We'll discuss with the patient's cardiology team as well.  Family has arrived at approximately 8:50 AM, I had a prolonged discussion with the family regarding her care, they do agree and the patient agrees that she is true DO NOT RESUSCITATE, she does not want to have any resuscitation should she go into ventricular tachycardia or a V. fib arrest. I have discussed her care with the cardiology consultant team, they will come to see the patient in consultation, I do suspect an underlying medical illness that his parotid this arrhythmia.  The patient has had several more episodes of nonsustained ventricular tachycardia, the majority of the time in the emergency department she is resting peacefully with a constant bradycardia with ectopy, the care was discussed with the cardiology team, they will see the patient in consultation, the care was discussed with the hospitalist who will admit, care was discussed extensively with family members who are in agreement with the plan.  Eber Hong, MD 06/30/2015 1230

## 2015-07-13 NOTE — ED Notes (Signed)
Pt to department via EMS from [facility]- reports nausea and vomiting for the past couple of days. EMS reports she had a run of V-tach with being transported in to the department. Pt also received nitro for chest pain. HR-40 Bp-120/60 20g LAC.

## 2015-07-13 NOTE — ED Notes (Signed)
HR conti nues as before and intermittently drops to24.. Dr. Hyacinth Meeker aware.

## 2015-07-13 NOTE — Progress Notes (Signed)
Addendum:  Patient/family request comfort care. She has been Hospice patient.   See cardiology note.   Family at bedside.      Lesle Chris. Black, ANP

## 2015-07-13 NOTE — ED Notes (Signed)
Monitor indicates VT. On arrival to room pt states she feels like she had "a spell". Pt denies c/o or pain. Skin warm and dry. Radial pulse 32.

## 2015-07-13 NOTE — Progress Notes (Signed)
Bedside EEG attempted. Pts family asked that EEG not be done. RN Aldean Jewett, speaking with MD about canceling the order for EEG at this time.

## 2015-07-14 LAB — URINE CULTURE
CULTURE: NO GROWTH
Special Requests: NORMAL

## 2015-07-18 LAB — CULTURE, BLOOD (ROUTINE X 2)
CULTURE: NO GROWTH
Culture: NO GROWTH

## 2015-07-20 NOTE — Progress Notes (Signed)
Rn called by family member inside the room.Pt Has no respiration, no pulse, No Bp. Expired at 0423, verified by 2nd RnMarylin Crosby).Emotional support given to family. On call NP notified, referal to [place] [facility] services done.Bed placement notified. Will do post mortem care as soon as family leave.  Wasted of Morphine /ml with Chuck Hint, RN.

## 2015-07-20 DEATH — deceased

## 2015-08-20 NOTE — Discharge Summary (Signed)
Death Summary  Patricia PattyDoris S Ebeling ION:[id number] DOB: [birth year] DOA: 07/05/2015  PCP: Londell MohPHARR,WALTER DAVIDSON, MD   Admit date: 07/08/2015 Date of Death: 10-Feb-2016  Final Diagnoses:  Principal Problem:   Seizure (HCC) Active Problems:   Hyperlipidemia   Hypertensive heart disease   Chronic atrial fibrillation (HCC)   History of mitral valve replacement with bioprosthetic valve   COPD, mild (HCC)   Chronic diastolic heart failure (HCC)   Chronic respiratory failure (HCC)   Non-sustained ventricular tachycardia (HCC)   Bradycardia   NSVT (nonsustained ventricular tachycardia) (HCC)   Hyperglycemia   Acute kidney injury (HCC)   End of life care   Thrombocytopenia (HCC)   Palliative care encounter   Goals of care, counseling/discussion    History of present illness:  Patricia [name] is a very pleasant 82 y.o. female with a past medical history that includes CAD, hypertension, COPD, A. fib, hypothyroid, right-sided heart failure, presents emergency department from a nursing facility with if complaint seizure-like activity and tachycardia. Initially evaluation in the emergency department reveals seizure-like activity as well as tachybradycardia arrhythmia.  Information is obtained from the patient and the daughters who are at the bedside. She's had gradual worsening dry nonproductive cough over the last 3 days. Her husband has been ill with an upper respiratory infection and is being hospitalized as well. EMS was called to the residence for him when they noticed that she demonstrated seizure-like activity lasted 30 seconds. They witnessed several episodes. Placed her on the monitor and found her to be in a wide complex regular tachycardia. Resolved spontaneously. Associated symptoms include nausea with intermittent vomiting and decreased oral intake as well as shortness of breath. Patient denies chest pain palpitation headache dizziness syncope or near-syncope. She does report frequency couple  episodes but this is related to her chronic hypoxia. She denies abdominal pain diarrhea dysuria hematuria frequency or urgency.  In the emergency department to 100.2 hemodynamically stable with a blood pressure on the soft side heart rate range 29-47.   Hospital Course:  Patient was initially admitted for seizure-like activity which was later thought to be due to intermittent CNS hypoperfusion secondary to underlying severe bradycardia. Patient with multiple family members present at time of admission including Dr. Ortencia KickByron of [facility]. After short period of time and initial workup it was felt the patient would be best served being made comfort care. This was done expeditiously and patient was moved to 6 N. Patient passed the following morning at approximately 04:23 in the morning. Patient's passage was peaceful with with family around.    Signed:  MERRELL, DAVID J  [facility] 07/25/2015, 10:57 AM

## 2015-10-21 IMAGING — CR DG CHEST 2V
2 series · 2 of 2 positions shown · non-contrast
Comparison: 02/13/2014

CLINICAL DATA: [REDACTED] patient. Abdominal pain, nausea.
Cardiovascular disease.

EXAM:
CHEST  2 VIEW

[w chest lat]
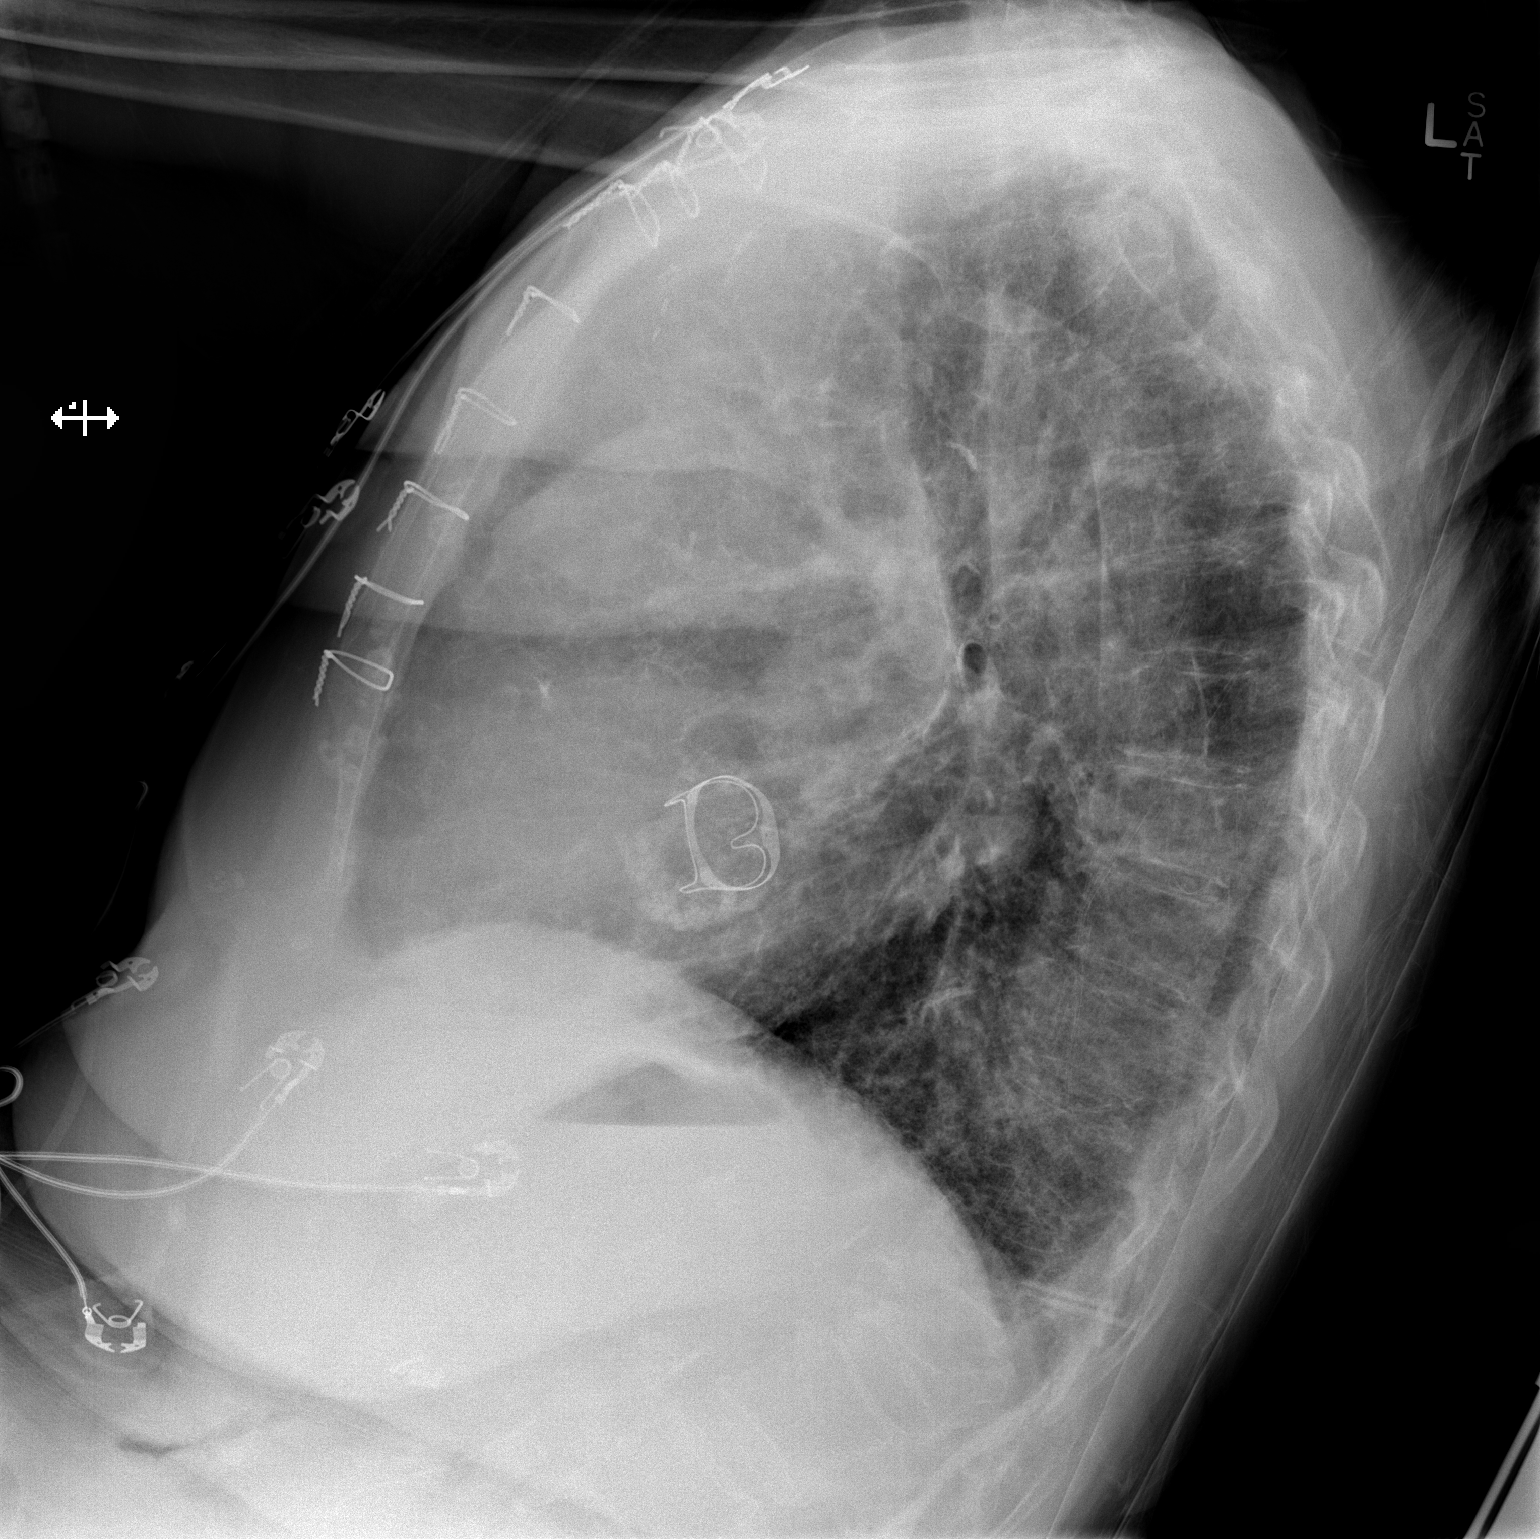

[x chest ap]
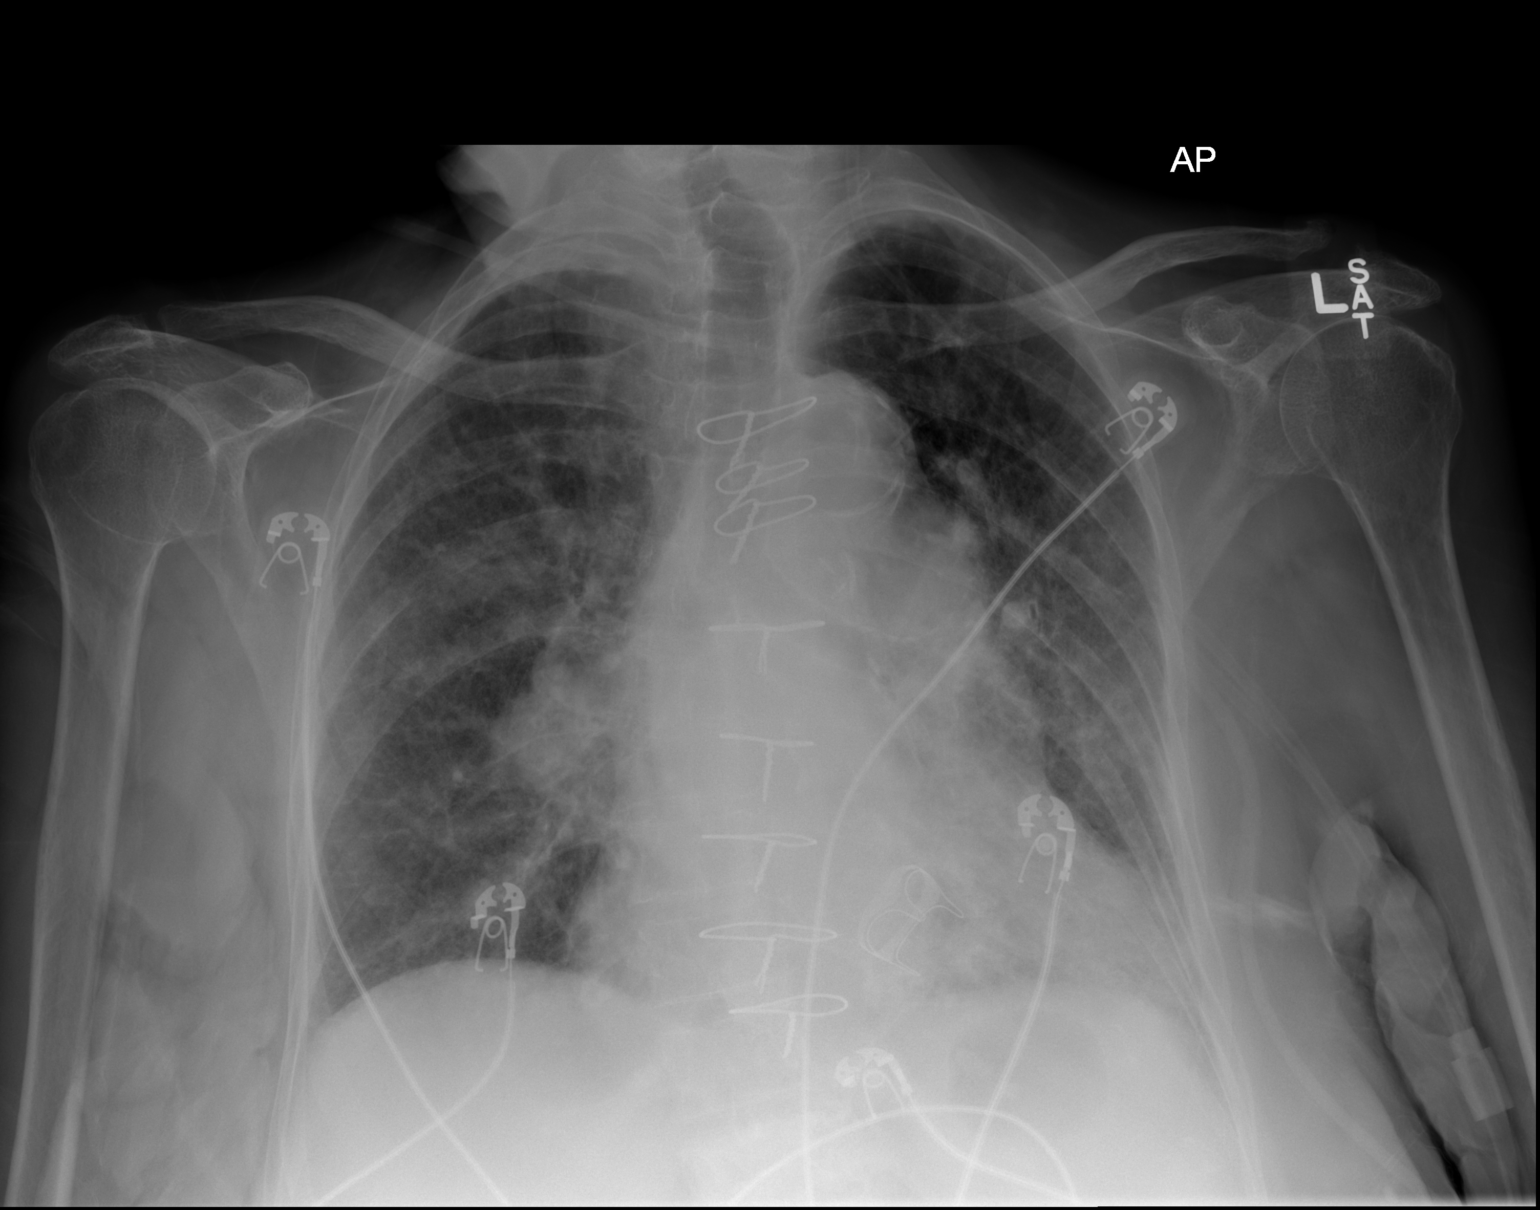

[2 of 2 positions shown; findings below may reference images not displayed]

FINDINGS: Sternotomy wires overlie an enlarged cardiac silhouette. Pulmonary
arteries are prominent. There is chronic interstitial lung disease.

Chronic compression fracture of the upper lumbar spine again
demonstrated.
IMPRESSION: 1. No acute cardiopulmonary findings.
2. Chronic interstitial lung disease again demonstrated.
